# Patient Record
Sex: Male | Born: 1940 | Race: White | Hispanic: No | Smoking: Former smoker
Health system: Southern US, Community
[De-identification: ages and names within clinical notes are randomized; demographics above are authoritative.]

## PROBLEM LIST (undated history)

## (undated) DIAGNOSIS — K219 Gastro-esophageal reflux disease without esophagitis: Secondary | ICD-10-CM

## (undated) DIAGNOSIS — Z860101 Personal history of adenomatous and serrated colon polyps: Secondary | ICD-10-CM

## (undated) DIAGNOSIS — I6523 Occlusion and stenosis of bilateral carotid arteries: Secondary | ICD-10-CM

## (undated) DIAGNOSIS — Z87448 Personal history of other diseases of urinary system: Secondary | ICD-10-CM

## (undated) DIAGNOSIS — D490 Neoplasm of unspecified behavior of digestive system: Secondary | ICD-10-CM

## (undated) DIAGNOSIS — Z87442 Personal history of urinary calculi: Secondary | ICD-10-CM

## (undated) DIAGNOSIS — E119 Type 2 diabetes mellitus without complications: Secondary | ICD-10-CM

## (undated) DIAGNOSIS — Z86010 Personal history of colonic polyps: Secondary | ICD-10-CM

## (undated) DIAGNOSIS — I10 Essential (primary) hypertension: Secondary | ICD-10-CM

## (undated) DIAGNOSIS — Z973 Presence of spectacles and contact lenses: Secondary | ICD-10-CM

## (undated) DIAGNOSIS — Z86718 Personal history of other venous thrombosis and embolism: Secondary | ICD-10-CM

## (undated) DIAGNOSIS — T7840XA Allergy, unspecified, initial encounter: Secondary | ICD-10-CM

## (undated) DIAGNOSIS — J3089 Other allergic rhinitis: Secondary | ICD-10-CM

## (undated) DIAGNOSIS — N183 Chronic kidney disease, stage 3 unspecified: Secondary | ICD-10-CM

## (undated) DIAGNOSIS — I6529 Occlusion and stenosis of unspecified carotid artery: Secondary | ICD-10-CM

## (undated) DIAGNOSIS — N200 Calculus of kidney: Secondary | ICD-10-CM

## (undated) DIAGNOSIS — E559 Vitamin D deficiency, unspecified: Secondary | ICD-10-CM

## (undated) DIAGNOSIS — E349 Endocrine disorder, unspecified: Secondary | ICD-10-CM

## (undated) DIAGNOSIS — N210 Calculus in bladder: Secondary | ICD-10-CM

## (undated) DIAGNOSIS — E291 Testicular hypofunction: Secondary | ICD-10-CM

## (undated) DIAGNOSIS — E782 Mixed hyperlipidemia: Secondary | ICD-10-CM

## (undated) DIAGNOSIS — R319 Hematuria, unspecified: Secondary | ICD-10-CM

## (undated) DIAGNOSIS — Z87898 Personal history of other specified conditions: Secondary | ICD-10-CM

## (undated) DIAGNOSIS — Z86711 Personal history of pulmonary embolism: Secondary | ICD-10-CM

## (undated) DIAGNOSIS — I723 Aneurysm of iliac artery: Secondary | ICD-10-CM

## (undated) DIAGNOSIS — R399 Unspecified symptoms and signs involving the genitourinary system: Secondary | ICD-10-CM

## (undated) DIAGNOSIS — N182 Chronic kidney disease, stage 2 (mild): Secondary | ICD-10-CM

## (undated) DIAGNOSIS — R310 Gross hematuria: Secondary | ICD-10-CM

## (undated) DIAGNOSIS — D689 Coagulation defect, unspecified: Secondary | ICD-10-CM

## (undated) DIAGNOSIS — F419 Anxiety disorder, unspecified: Secondary | ICD-10-CM

## (undated) DIAGNOSIS — J309 Allergic rhinitis, unspecified: Secondary | ICD-10-CM

## (undated) HISTORY — PX: ORCHIOPEXY: SUR915

## (undated) HISTORY — DX: Endocrine disorder, unspecified: E34.9

## (undated) HISTORY — DX: Coagulation defect, unspecified: D68.9

## (undated) HISTORY — DX: Allergy, unspecified, initial encounter: T78.40XA

## (undated) HISTORY — DX: Neoplasm of unspecified behavior of digestive system: D49.0

## (undated) HISTORY — DX: Testicular hypofunction: E29.1

## (undated) HISTORY — DX: Calculus of kidney: N20.0

## (undated) HISTORY — PX: TONSILLECTOMY: SUR1361

## (undated) HISTORY — DX: Gastro-esophageal reflux disease without esophagitis: K21.9

## (undated) HISTORY — DX: Vitamin D deficiency, unspecified: E55.9

## (undated) HISTORY — PX: OTHER SURGICAL HISTORY: SHX169

## (undated) HISTORY — PX: COLONOSCOPY: SHX174

---

## 2001-01-03 ENCOUNTER — Ambulatory Visit (HOSPITAL_BASED_OUTPATIENT_CLINIC_OR_DEPARTMENT_OTHER): Admission: RE | Admit: 2001-01-03 | Discharge: 2001-01-03 | Payer: Self-pay | Admitting: Surgery

## 2001-01-03 ENCOUNTER — Encounter (INDEPENDENT_AMBULATORY_CARE_PROVIDER_SITE_OTHER): Payer: Self-pay

## 2001-01-03 ENCOUNTER — Other Ambulatory Visit: Admission: RE | Admit: 2001-01-03 | Discharge: 2001-01-03 | Payer: Self-pay | Admitting: Surgery

## 2001-01-03 HISTORY — PX: OTHER SURGICAL HISTORY: SHX169

## 2001-07-11 ENCOUNTER — Other Ambulatory Visit: Admission: RE | Admit: 2001-07-11 | Discharge: 2001-07-11 | Payer: Self-pay | Admitting: Surgery

## 2002-02-06 ENCOUNTER — Ambulatory Visit (HOSPITAL_COMMUNITY): Admission: RE | Admit: 2002-02-06 | Discharge: 2002-02-06 | Payer: Self-pay | Admitting: Internal Medicine

## 2002-02-06 ENCOUNTER — Encounter: Payer: Self-pay | Admitting: Internal Medicine

## 2002-02-11 ENCOUNTER — Ambulatory Visit (HOSPITAL_COMMUNITY): Admission: RE | Admit: 2002-02-11 | Discharge: 2002-02-11 | Payer: Self-pay | Admitting: Internal Medicine

## 2002-02-11 ENCOUNTER — Encounter: Payer: Self-pay | Admitting: Internal Medicine

## 2002-02-13 ENCOUNTER — Emergency Department (HOSPITAL_COMMUNITY): Admission: EM | Admit: 2002-02-13 | Discharge: 2002-02-13 | Payer: Self-pay | Admitting: Emergency Medicine

## 2004-04-22 ENCOUNTER — Ambulatory Visit: Payer: Self-pay | Admitting: Gastroenterology

## 2004-05-05 ENCOUNTER — Ambulatory Visit: Payer: Self-pay | Admitting: Gastroenterology

## 2006-07-12 ENCOUNTER — Ambulatory Visit (HOSPITAL_COMMUNITY): Admission: RE | Admit: 2006-07-12 | Discharge: 2006-07-12 | Payer: Self-pay | Admitting: Internal Medicine

## 2006-07-12 IMAGING — CR DG CHEST 2V
3 series · 3 of 3 positions shown · non-contrast
Comparison: [DATE].

CLINICAL DATA: Hypertension.  Dyspnea.  
 CHEST - 2 VIEW:

[view not recorded (1 of 3)]
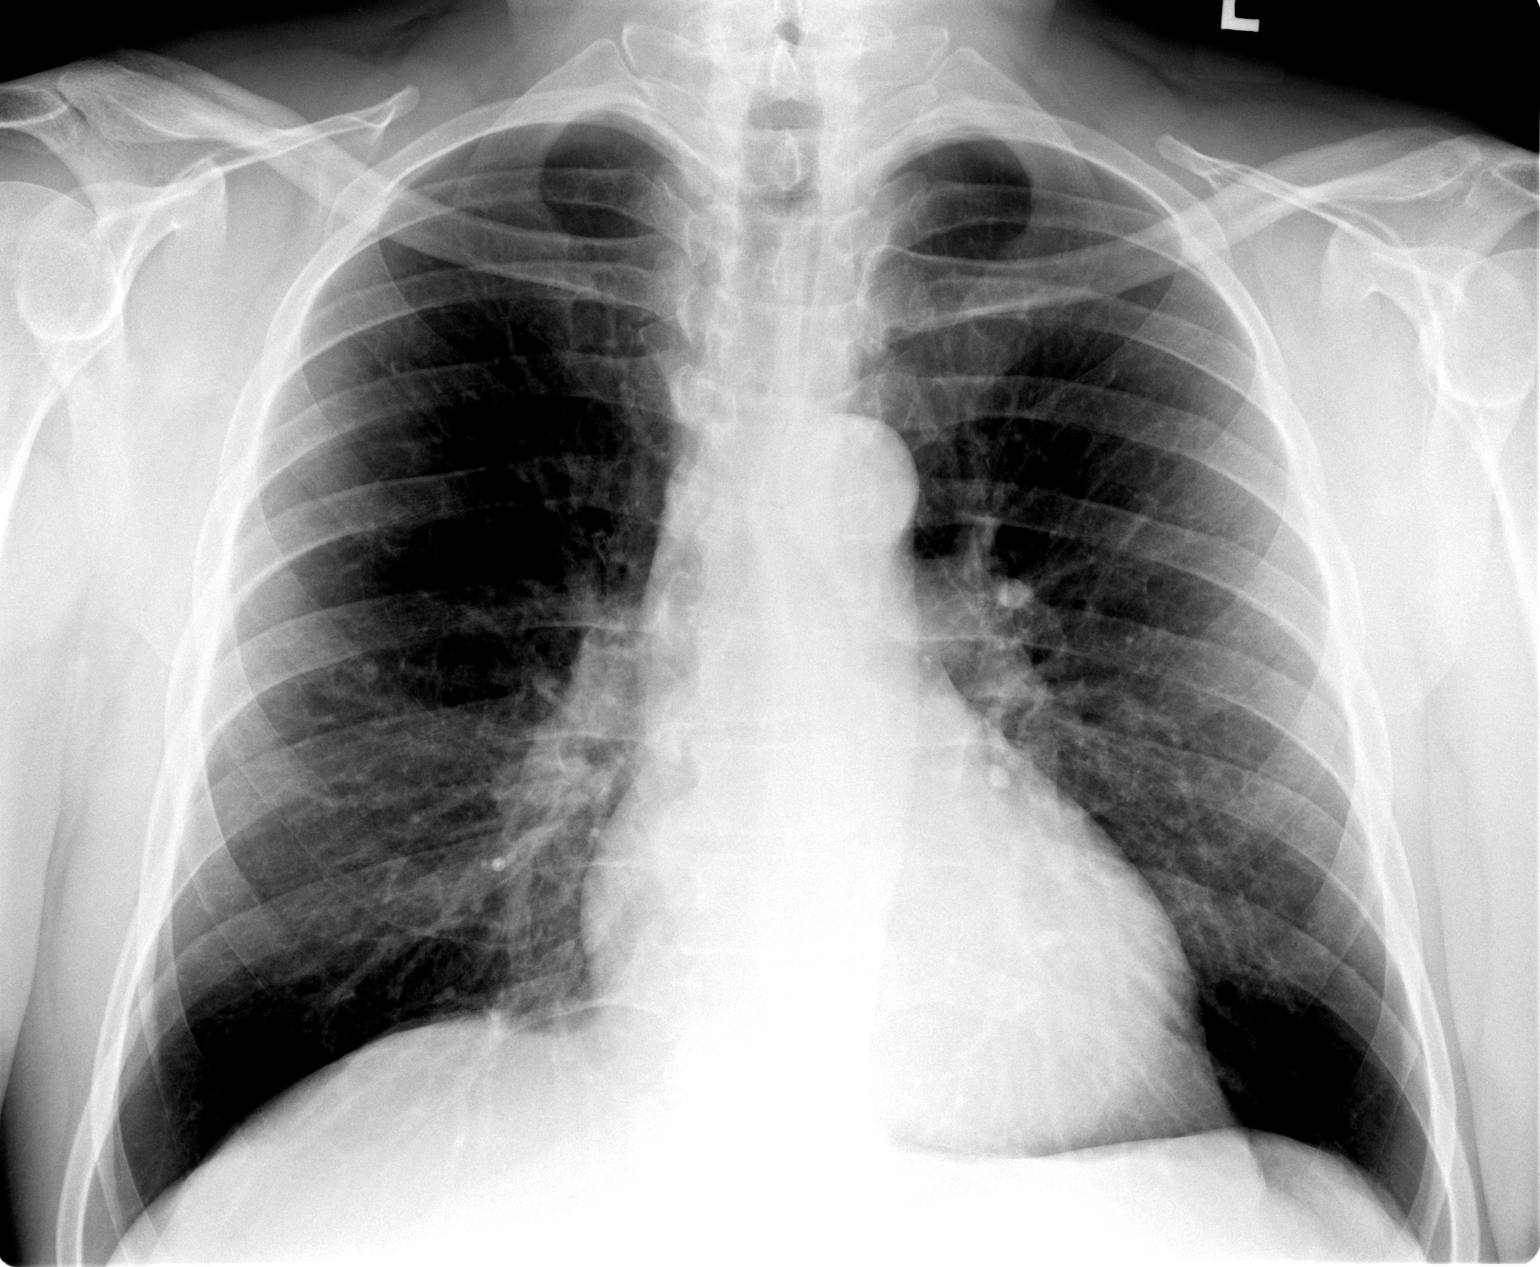

[view not recorded (2 of 3)]
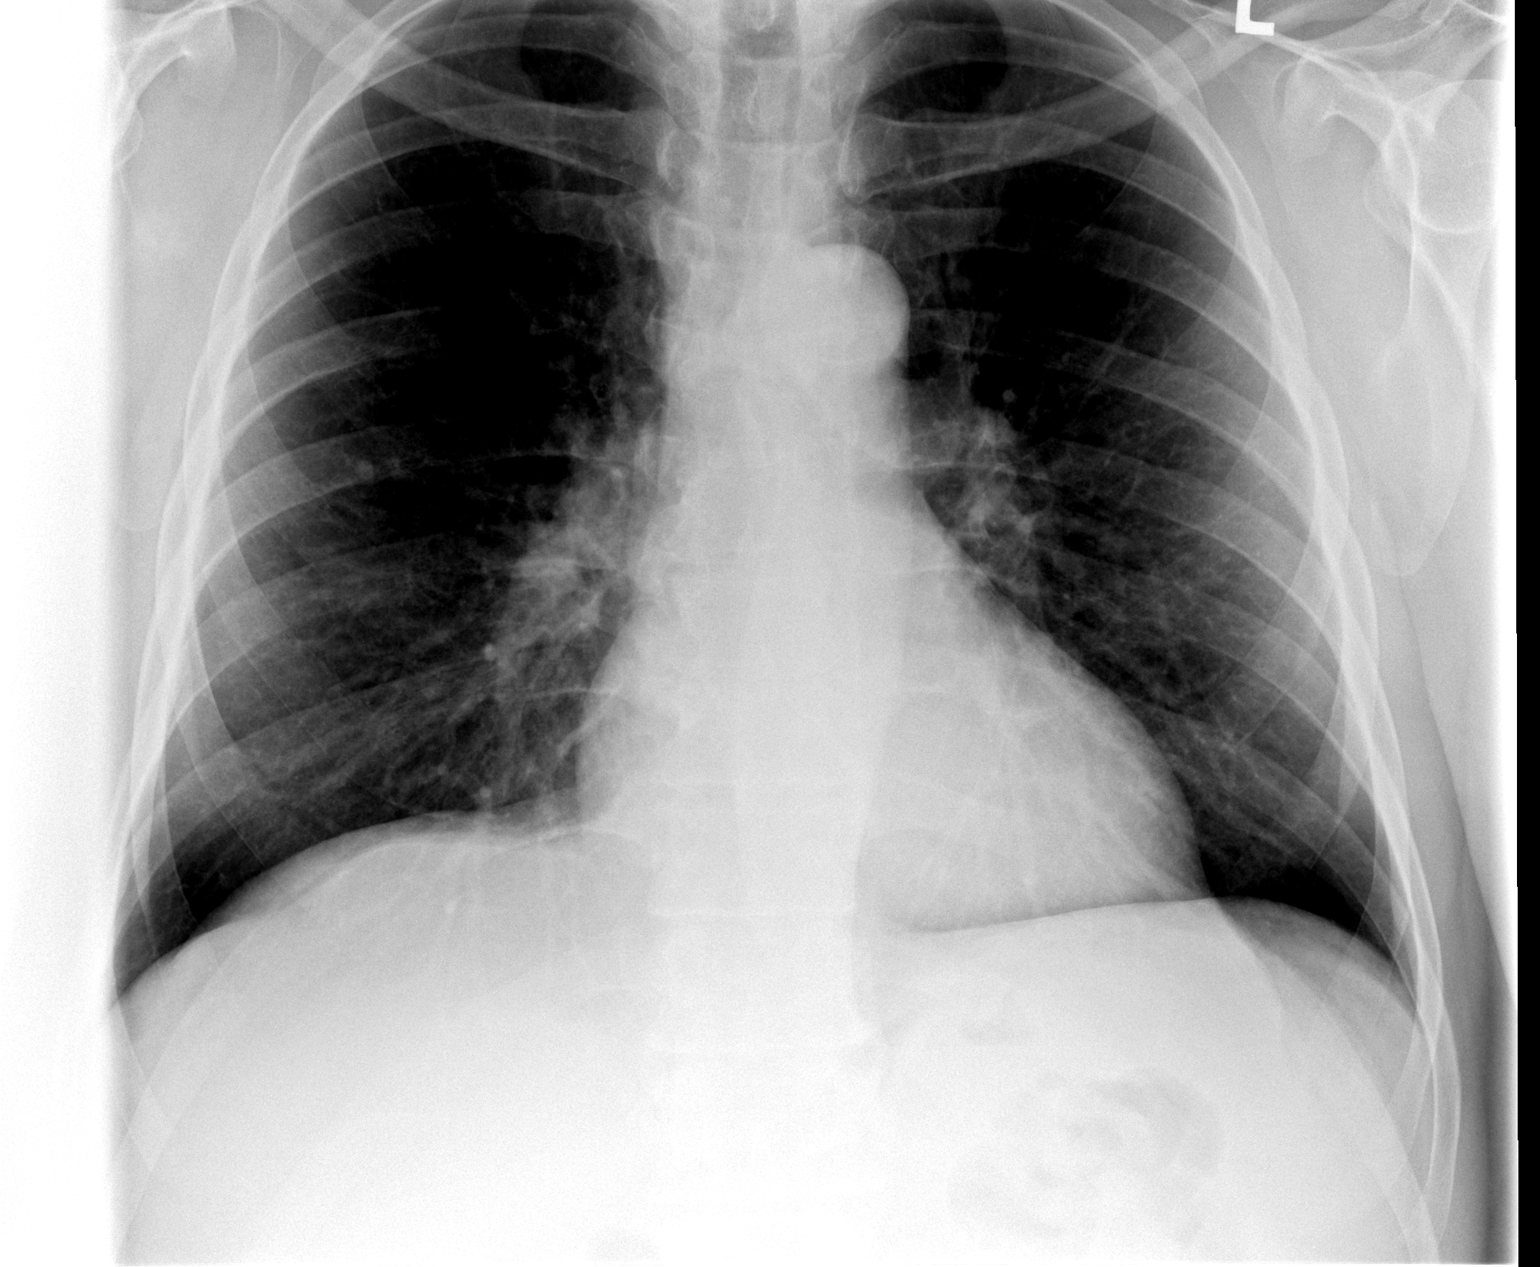

[view not recorded (3 of 3)]
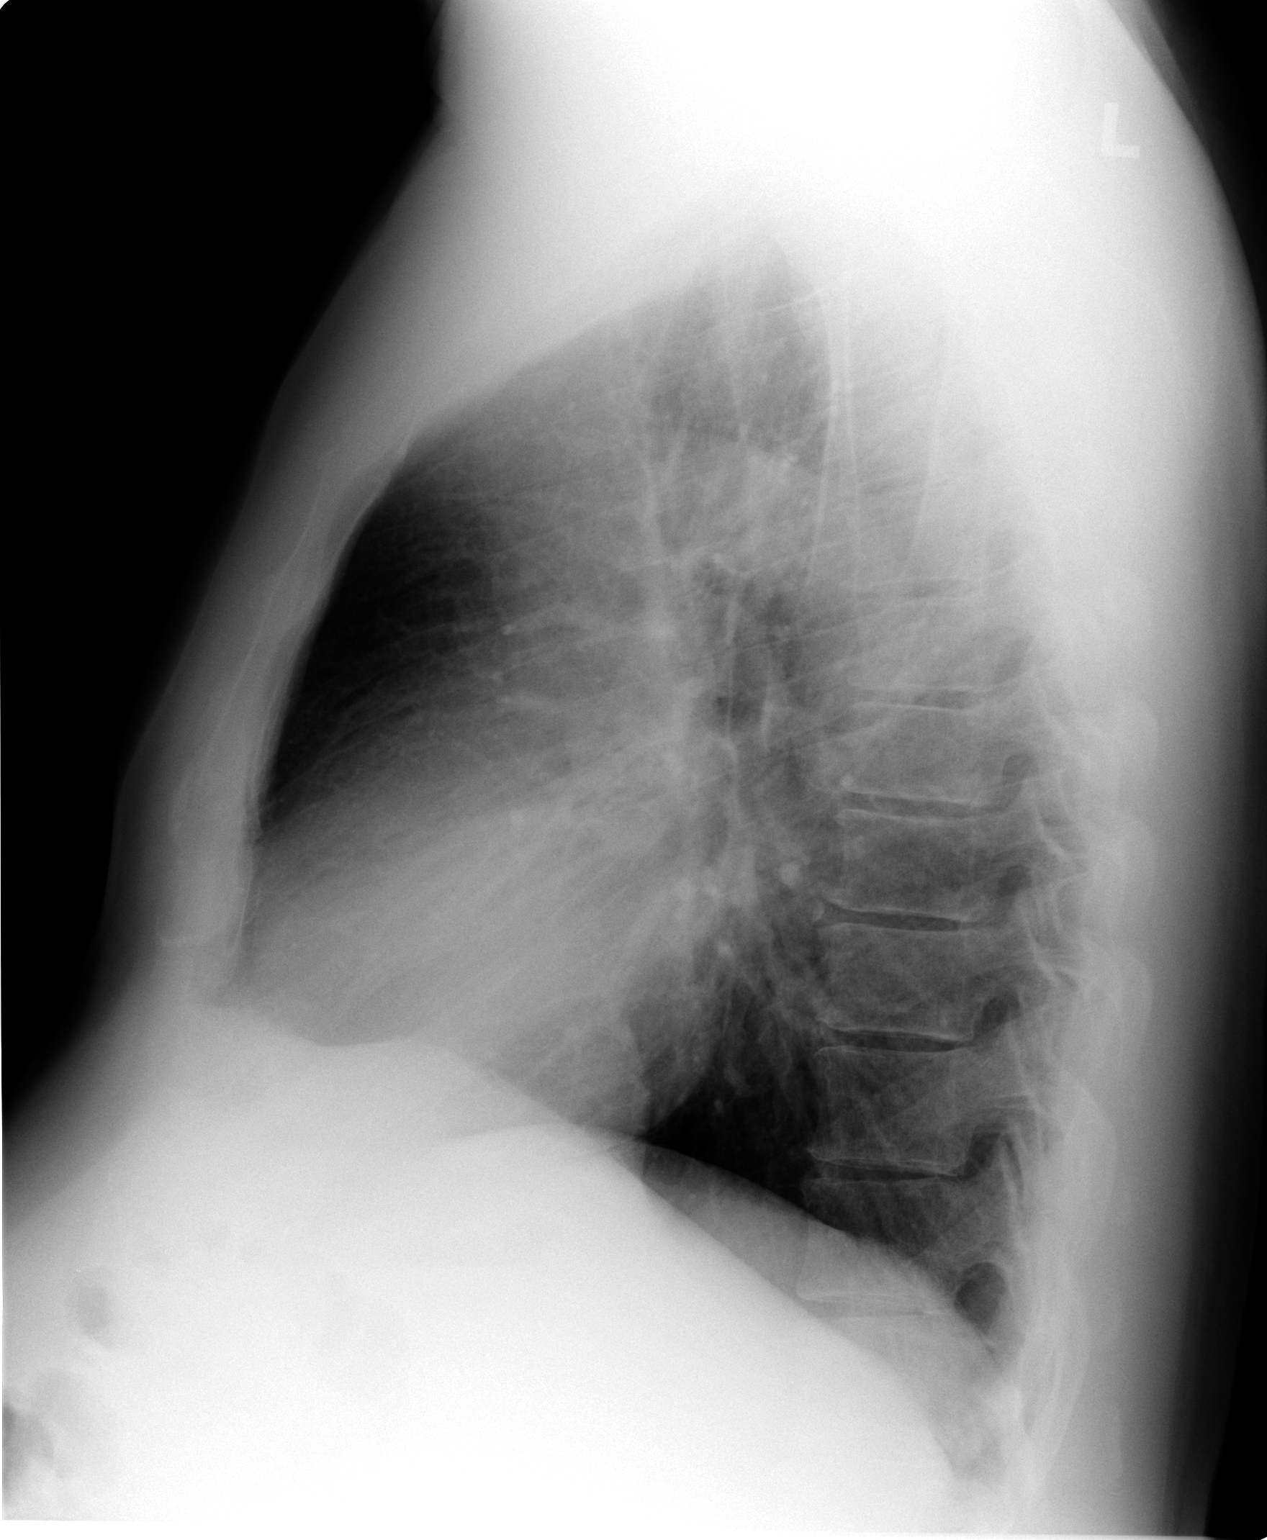

[3 of 3 positions shown; findings below may reference images not displayed]

FINDINGS: The heart is at the upper limits of normal in size.  The mediastinum is unremarkable.  The lungs are clear except for an old granuloma in the right lung previously seen.  No effusions.  No significant bony finding.
IMPRESSION: No change.  No active disease.

## 2007-05-24 ENCOUNTER — Ambulatory Visit: Payer: Self-pay | Admitting: Gastroenterology

## 2007-06-07 ENCOUNTER — Ambulatory Visit: Payer: Self-pay | Admitting: Gastroenterology

## 2007-06-07 ENCOUNTER — Encounter: Payer: Self-pay | Admitting: Gastroenterology

## 2007-11-19 ENCOUNTER — Inpatient Hospital Stay (HOSPITAL_COMMUNITY): Admission: EM | Admit: 2007-11-19 | Discharge: 2007-11-24 | Payer: Self-pay | Admitting: Emergency Medicine

## 2007-11-19 ENCOUNTER — Ambulatory Visit: Payer: Self-pay | Admitting: Vascular Surgery

## 2007-11-19 IMAGING — CT CT ANGIO CHEST
2 of 4 series · 19 of 36 positions shown · IV contrast (80 ml omni 300)
Comparison: [DATE] chest x-ray.

CLINICAL DATA: Shortness of breath with right lower extremity DVT.

CT ANGIOGRAPHY CHEST
TECHNIQUE: Multidetector CT imaging of the chest using the
standard protocol during bolus administration of intravenous
contrast. Multiplanar reconstructed images obtained and reviewed to
evaluate the vascular anatomy.
Contrast: 80 ml intravenous [GH]

[Series 2: pe · axial · 0.81mm/px · z∈[-308,-34]mm · 16 of 248 slices shown]
[im 14/248  lung]
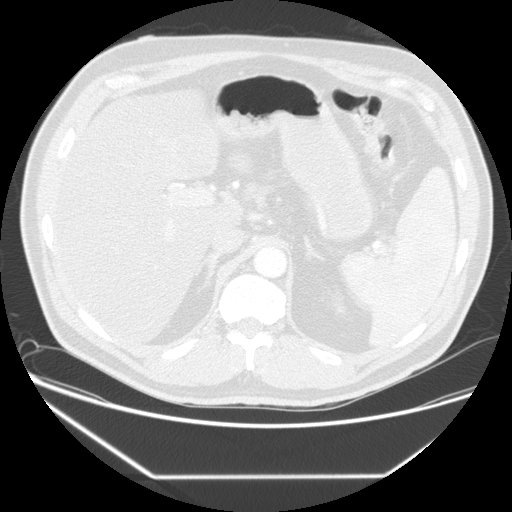
[im 28/248  mediastinal]
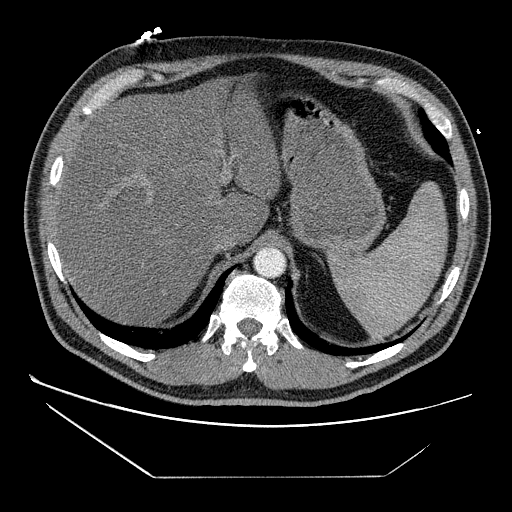
[im 42/248  lung]
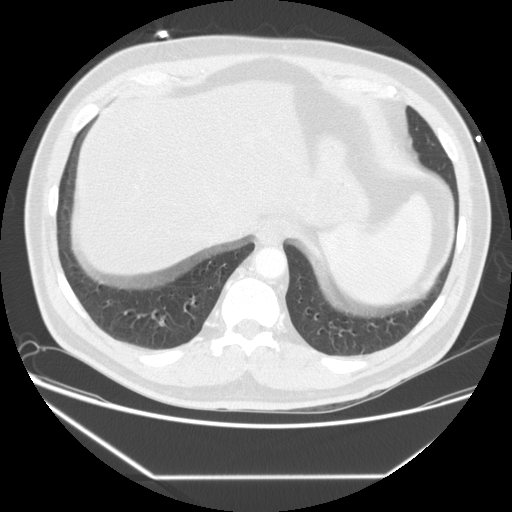
[im 55/248  mediastinal]
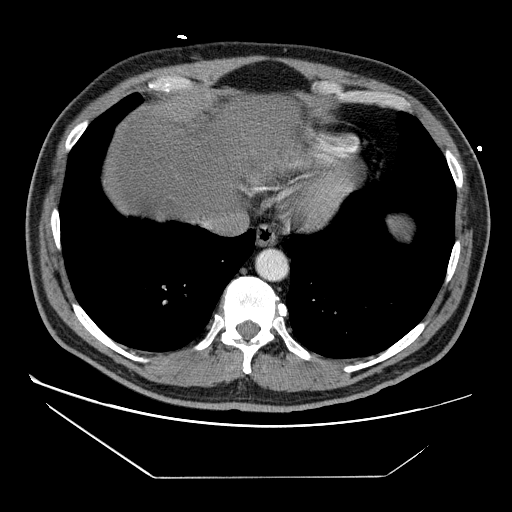
[im 69/248  lung]
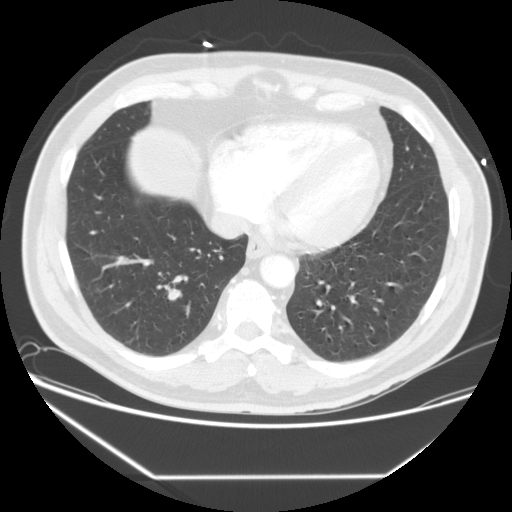
[im 83/248  mediastinal]
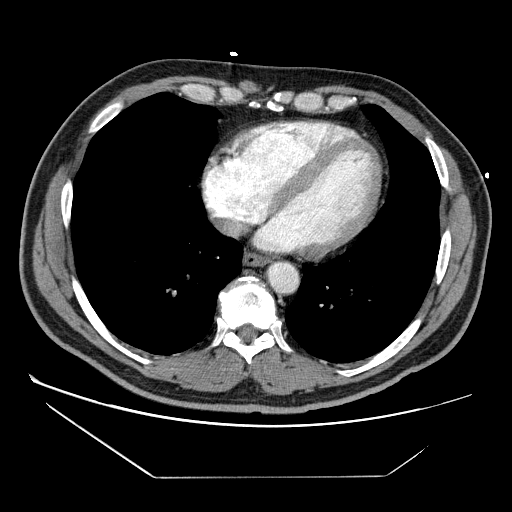
[im 97/248  lung]
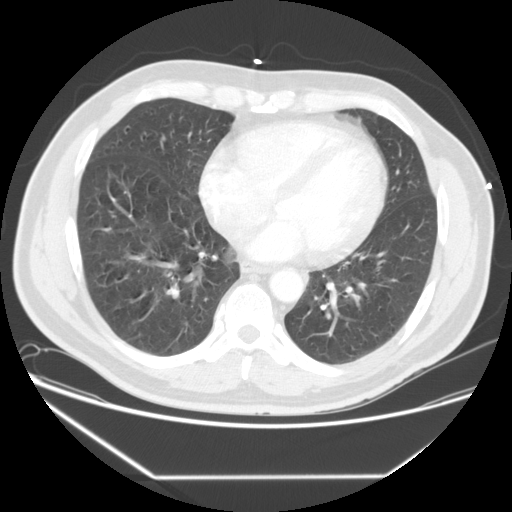
[im 110/248  mediastinal]
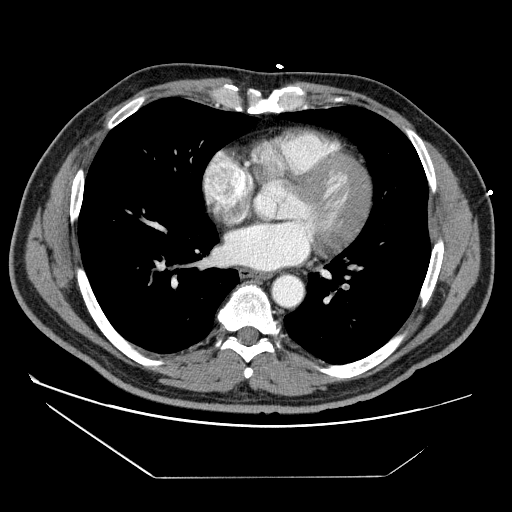
[im 138/248  lung]
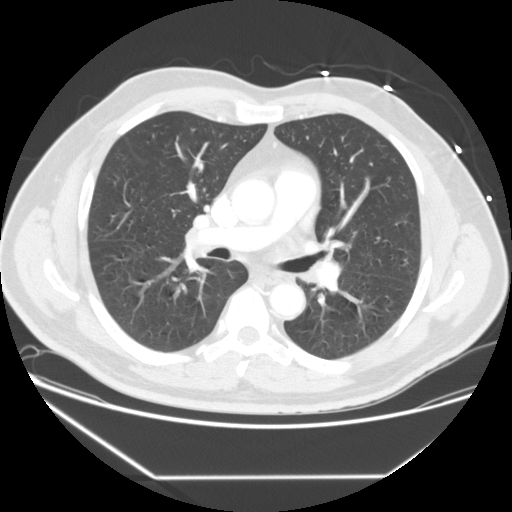
[im 151/248  mediastinal]
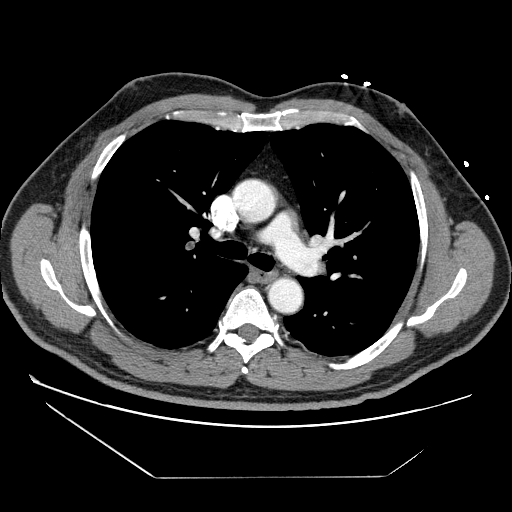
[im 165/248  lung]
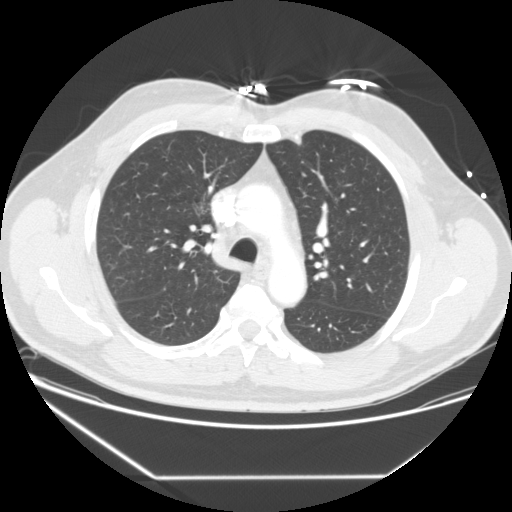
[im 179/248  mediastinal]
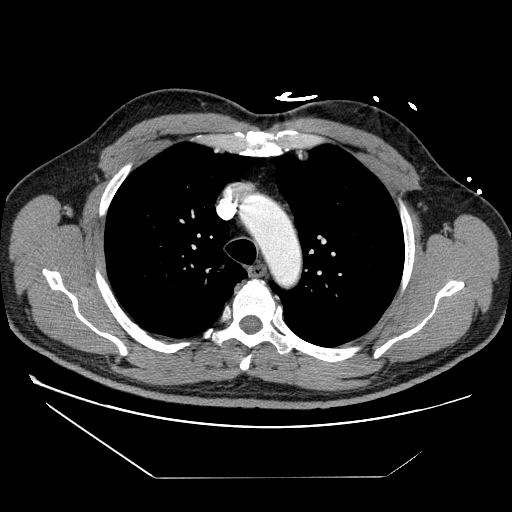
[im 193/248  lung]
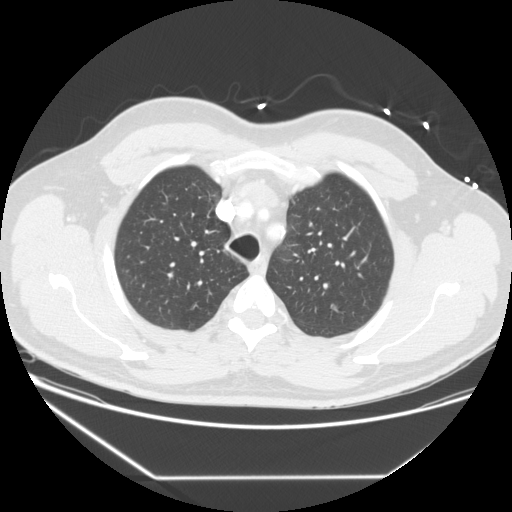
[im 206/248  mediastinal]
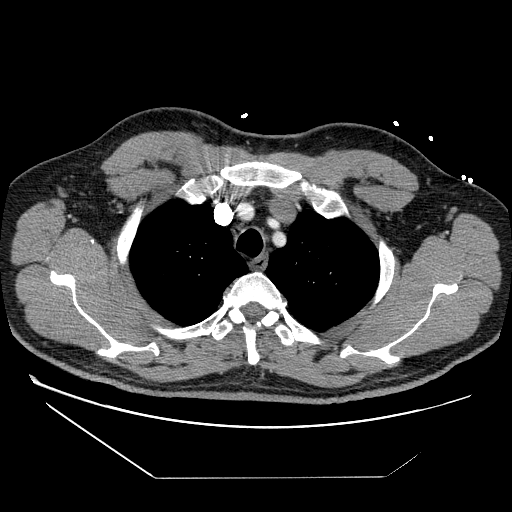
[im 220/248  lung]
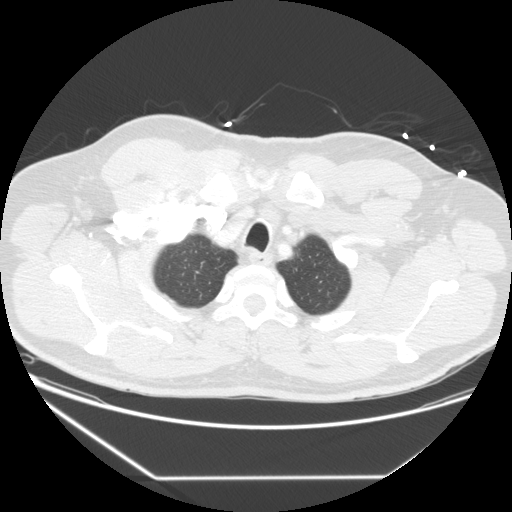
[im 234/248  mediastinal]
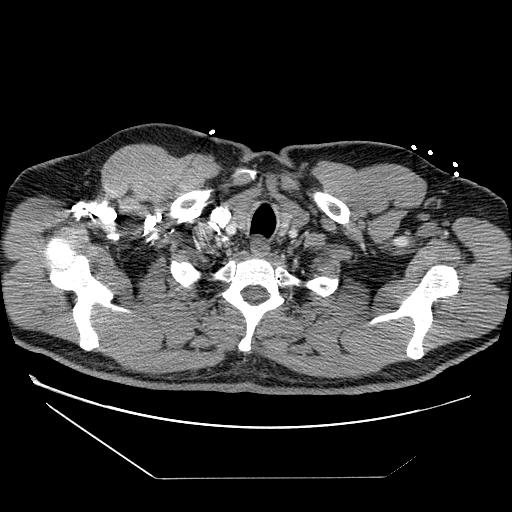

[Series 202: reformatted · coronal · 0.81mm/px · 3 of 104 slices shown]
[im 21/104  mediastinal]
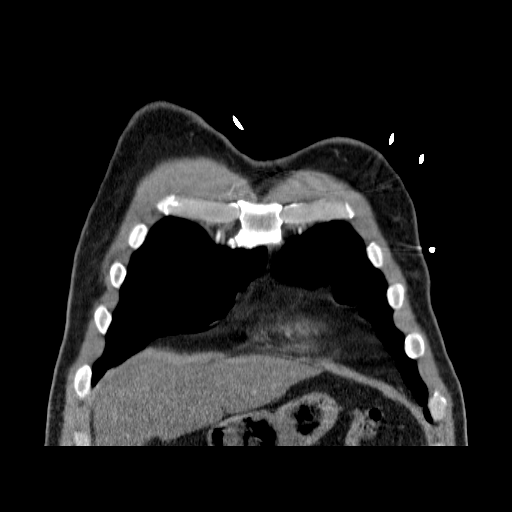
[im 42/104  mediastinal]
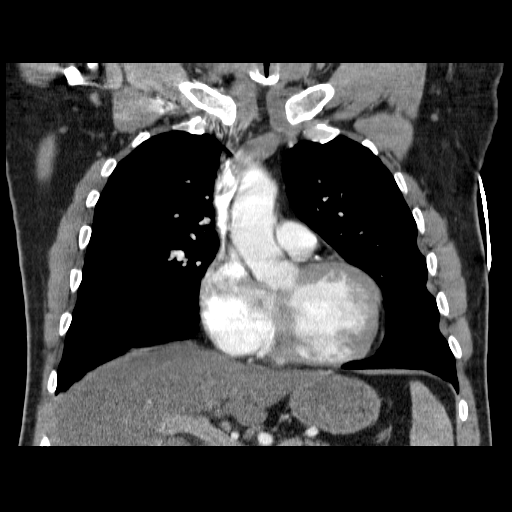
[im 62/104  mediastinal]
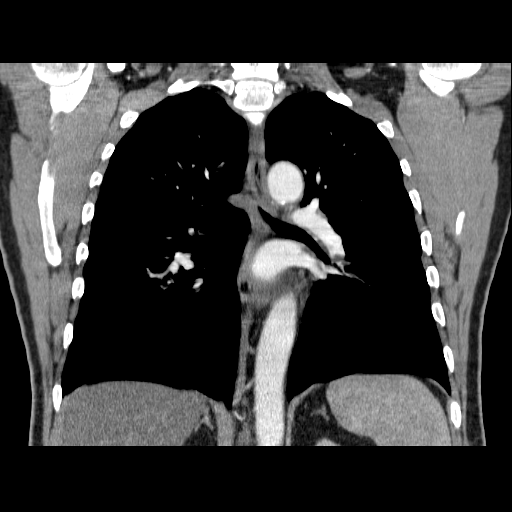

[19 of 36 positions shown; findings below may reference images not displayed]

FINDINGS: Filling defects are identified within both main pulmonary
arteries, and within the pulmonary arteries leading to the right
upper lobe, right middle lobe, and bilateral lower lobes -
compatible with pulmonary emboli.

An ill-defined faint 8 x 12 mm opacity within the medial right
upper lobe is nonspecific.

The lungs are otherwise clear.

No pleural or pericardial effusions are present.

No enlarged lymph nodes are identified.

There is no evidence of thoracic aortic dissection or aneurysm.

Diffuse fatty infiltration the liver and cholelithiasis identified.
IMPRESSION: Bilateral pulmonary emboli.

Ill-defined 8 x 12 mm right upper lobe opacity - nonspecific.  This
may represent sequelae from the pulmonary emboli but limited CT
follow-up of this area is recommended in 6 months.

Fatty infiltration of the liver and cholelithiasis.

Critical test results telephoned to Dr. MAYULU at the time of
interpretation on date [DATE] at time [DATE] p.m..

## 2007-11-20 ENCOUNTER — Ambulatory Visit: Payer: Self-pay | Admitting: Surgery

## 2007-11-20 ENCOUNTER — Encounter (INDEPENDENT_AMBULATORY_CARE_PROVIDER_SITE_OTHER): Payer: Self-pay | Admitting: Internal Medicine

## 2007-11-21 HISTORY — PX: IVC FILTER INSERTION: CATH118245

## 2007-11-21 IMAGING — RF DG CHEST 1V
1 series · 5 of 5 positions shown · non-contrast
Comparison: None.

CLINICAL DATA: IVC filter placement intraoperatively.

INTRAOPERATIVE ABDOMEN- 1 VIEW [DATE]:

[Series 1: run · 2 acquisitions, 5 frames shown]
[im 1/2]
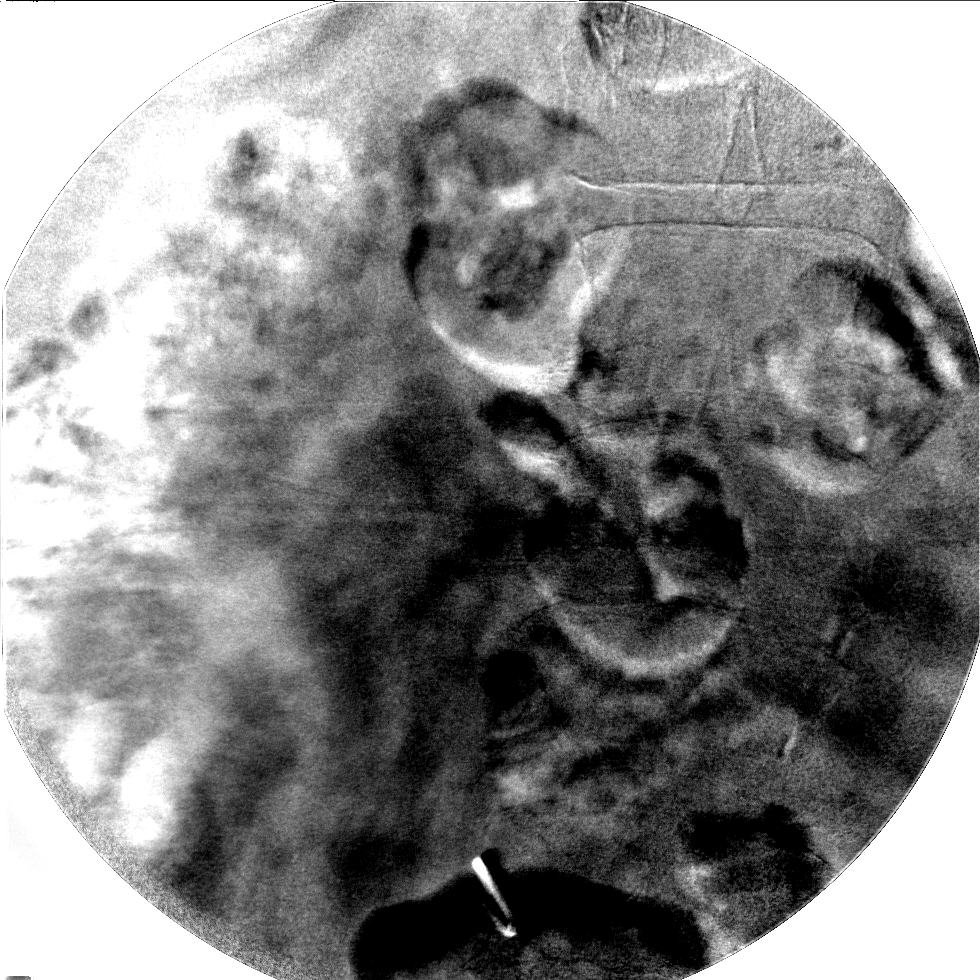
[im 1/2]
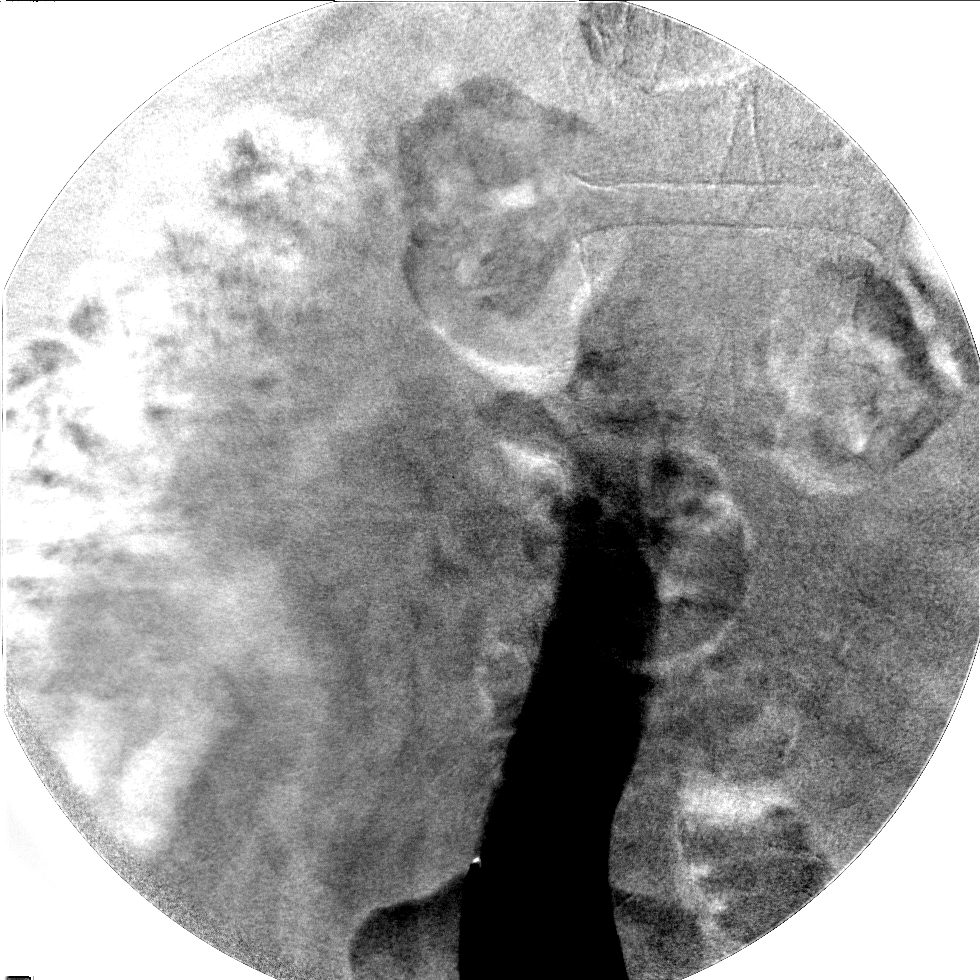
[im 1/2]
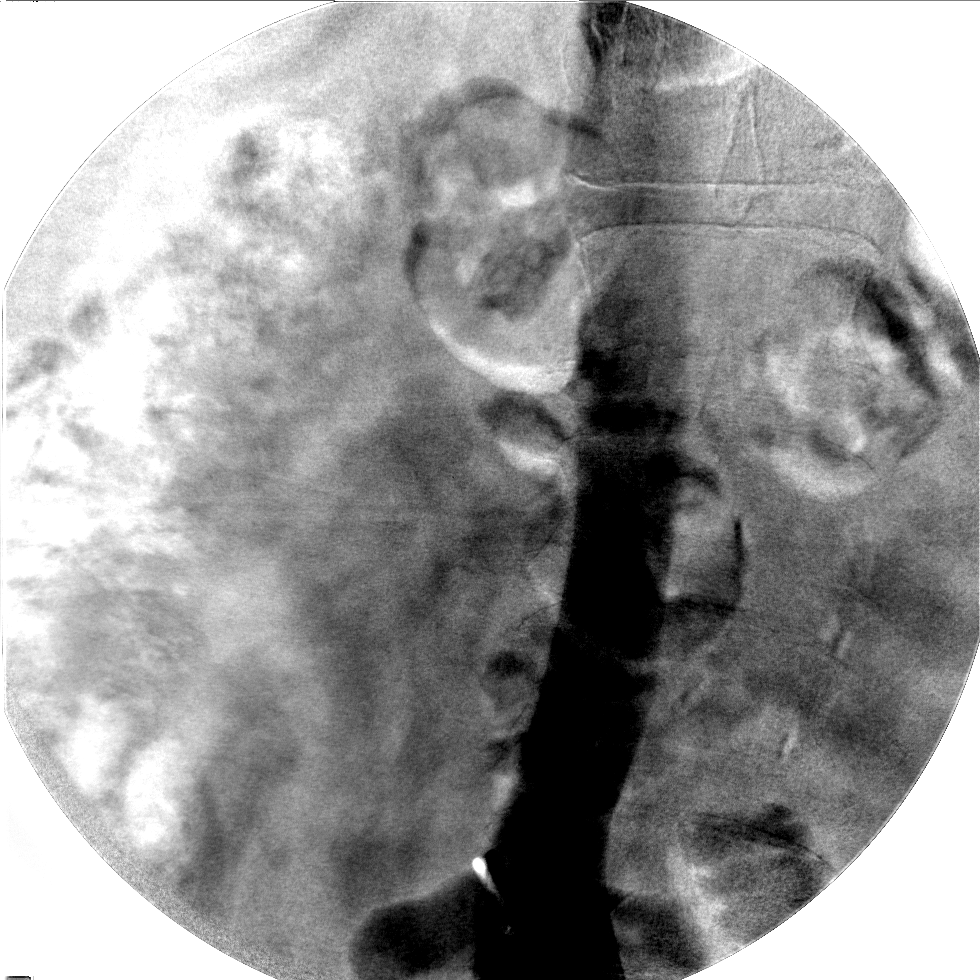
[im 1/2]
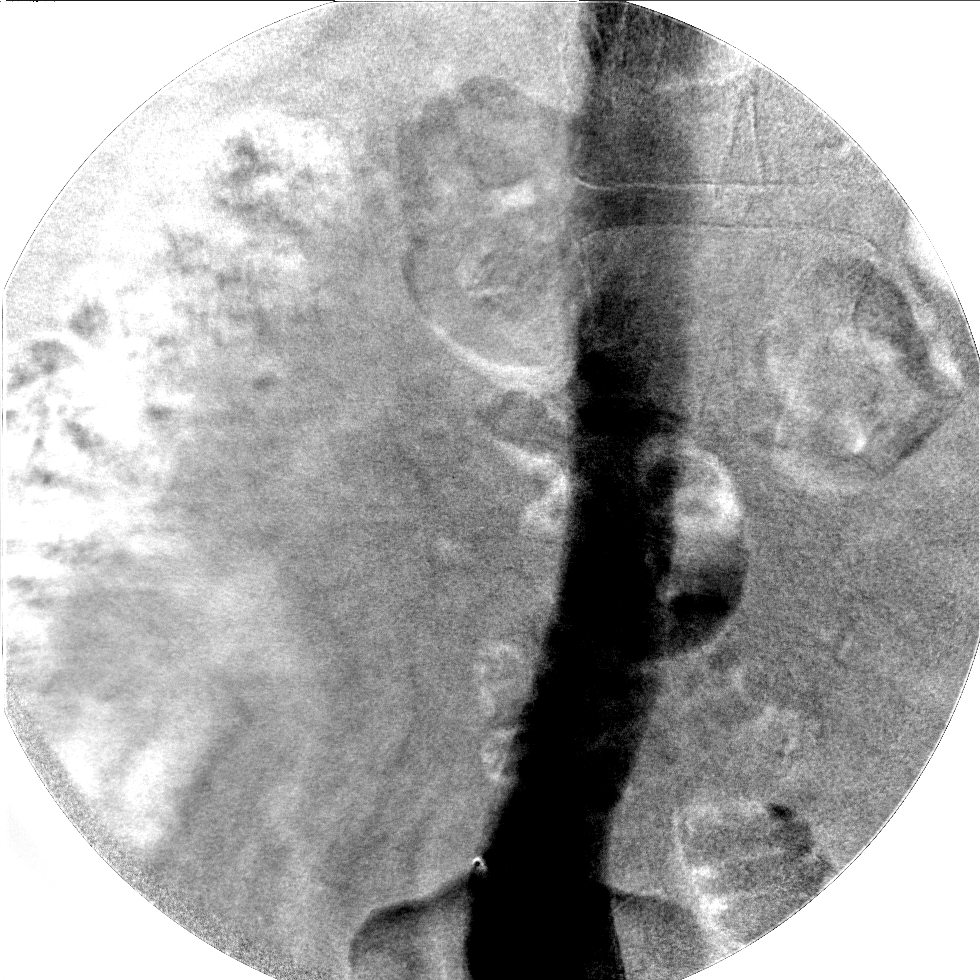
[im 2/2]
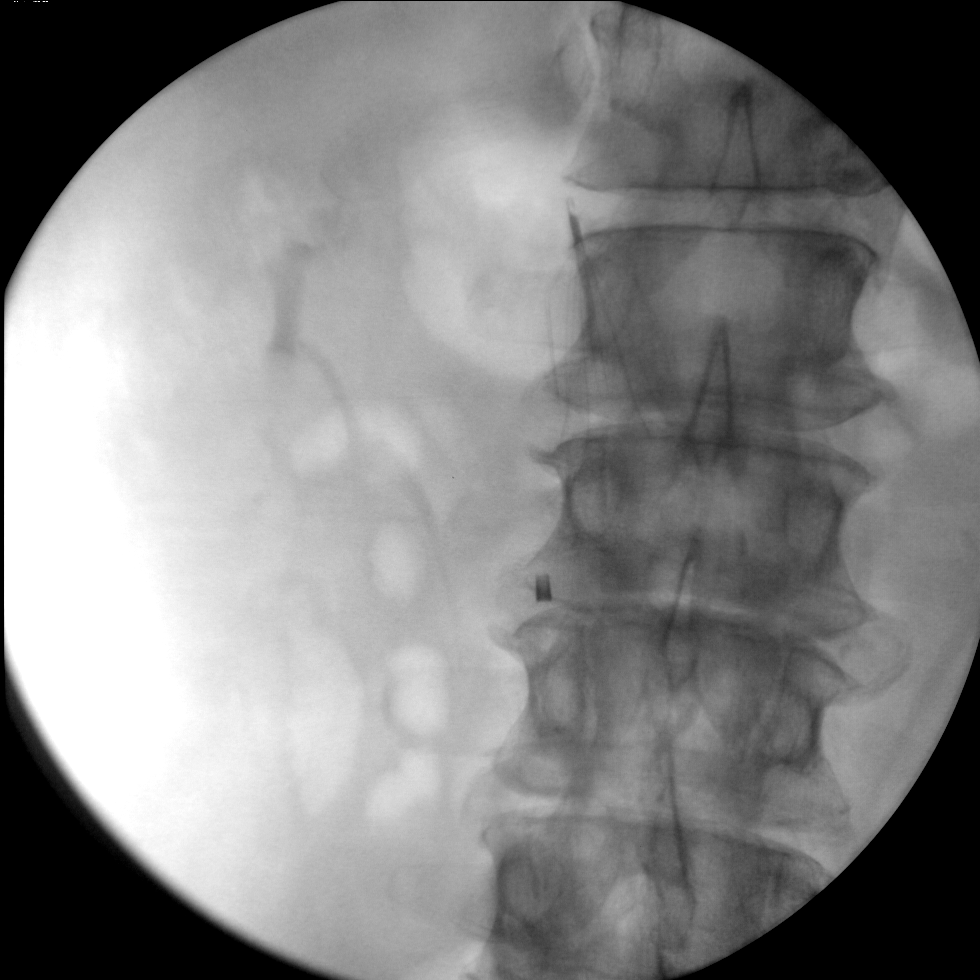

[5 of 5 positions shown; findings below may reference images not displayed]

FINDINGS: Single spot image from the C-arm fluoroscopic device
submitted for interpretation post-operatively demonstrates an IVC
filter with its tip at the T12 - L1 level.
IMPRESSION: IVC filter placement intraoperatively.

## 2008-01-21 ENCOUNTER — Ambulatory Visit: Payer: Self-pay | Admitting: Surgery

## 2008-01-31 ENCOUNTER — Ambulatory Visit: Payer: Self-pay | Admitting: Surgery

## 2008-01-31 ENCOUNTER — Ambulatory Visit (HOSPITAL_COMMUNITY): Admission: RE | Admit: 2008-01-31 | Discharge: 2008-01-31 | Payer: Self-pay | Admitting: Surgery

## 2008-01-31 HISTORY — PX: IVC FILTER REMOVAL: CATH118246

## 2010-08-03 NOTE — Procedures (Signed)
DUPLEX DEEP VENOUS EXAM - LOWER EXTREMITY   INDICATION:  Lab only.  Right leg edema.   HISTORY:  Edema:  Four days of right leg edema.  Trauma/Surgery:  No.  Pain:  Four days of right leg pain.  PE:  No.  Previous DVT:  No.  Anticoagulants:  No.  Other:  No.   DUPLEX EXAM:                CFV   SFV   PopV  PTV    GSV                R  L  R  L  R  L  R   L  R  L  Thrombosis    P  0  +     +     +      0  Spontaneous   +  +  0     0     0      +  Phasic        +  +  0     0     0      0  Augmentation     +  0     0     0      0  Compressible  P  +  0     0     0      +  Competent        +  0     0     +      0   Legend:  + - yes  o - no  p - partial  D - decreased   IMPRESSION:  Extensive deep vein thrombus is seen extending from a  partial thrombus in the right common femoral vein throughout a  completely occluded superficial femoral vein, popliteal vein and into  the distal calf portions of the posterior tibial veins.    _____________________________  Larina Earthly, M.D.   MC/MEDQ  D:  11/20/2007  T:  11/20/2007  Job:  [id number]

## 2010-08-03 NOTE — Assessment & Plan Note (Signed)
OFFICE VISIT   Joshua Peterson  DOB:  [birth year]                                       01/21/2008  YNWGN#:[id number]   REASON FOR VISIT:  Evaluate for filter removal.   HISTORY:  This is Peterson 70 year old gentleman who presented to the hospital  11/19/2007 with Peterson stent to his right lower extremity deep vein  thrombosis as well as pulmonary embolism.  Due to the significant clot  burden and his pulmonary vasculature, we elected to place an IVC filter.  The patient has been anticoagulated for 2 months now.  He is improving.  He has not had any shortness of breath.  He does still have some  swelling in his right leg which he wears compression stockings for.   PHYSICAL EXAMINATION:  Blood pressure is 130/83, pulse 57.  Generally,  he is well-appearing, in no acute distress.  Right leg is more swollen  than the left.  There is no ulceration.  Respirations are nonlabored.   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  The  left common femoral vein is within normal limits.   ASSESSMENT AND PLAN:  Deep vein thrombosis/pulmonary embolism.   PLAN:  The patient's IVC filter was placed due to the significant clot  burden in his pulmonary vasculature.  He had never been on  anticoagulation.  I think it is reasonable at this time to remove his  filter as he is tolerating his anticoagulation.  I have recommended that  he stop his treatment 5 days prior to the procedure.  He will be bridged  with Lovenox.  We discussed the details of the procedure.  He will call  to schedule this around his work schedule.  He understands that there is  an approximately 5% chance that we will not be able to remove the  filter.  Also, if there is clot within the filter, we will not remove  it.  It is Peterson Bard G2 filter.   Joshua Ny, MD  Electronically Signed   VWB/MEDQ  D:  01/21/2008  T:  01/22/2008  Job:  [id number]   cc:   Lucky Cowboy, M.D.  Lonia Blood, M.D.

## 2010-08-03 NOTE — Discharge Summary (Signed)
NAME:  Joshua Peterson, Joshua Peterson NO.:  [id number]   MEDICAL RECORD NO.:  [id number]          PATIENT TYPE:  INP   LOCATION:  2013                         FACILITY:  [facility]   PHYSICIAN:  Lonia Blood, M.D.DATE OF BIRTH:  1941/01/23   DATE OF ADMISSION:  11/19/2007  DATE OF DISCHARGE:  11/24/2007                               DISCHARGE SUMMARY   PRIMARY CARE PHYSICIAN:  Lucky Cowboy, MD   VASCULAR SURGEON:  1. Durene Cal IV, MD   DISCHARGE DIAGNOSES:  1. Bilateral pulmonary emboli with extensive right lower extremity      deep venous thrombosis.      a.     Status post IVC filter November 21, 2007.      b.     Status post Lovenox to Coumadin bridge.      c.     Coumadin for a minimum of 6 months to be continued.      d.     Followup appointment for probable removal of IVC filter in       approximately 2 months.  2. Newly diagnosed diabetes mellitus.      a.     Remote history of previous attempts at medication treatment.      b.     The patient previously educated on diabetic diet.  3. Hypertension.  4. Hyperlipidemia.   DISCHARGE MEDICATIONS:  1. Aspirin 81 mg p.o. daily.  2. Atenolol 100 mg p.o. daily.  3. Fenofibrate 134 mg p.o. daily.  4. Terbinafine 250 mg daily for 3 months and then stop.  5. Glucophage 500 mg p.o. b.i.d. with meals.  6. Coumadin as directed at discharge for a minimum of 6 months.   FOLLOWUP:  1. The patient is to follow up with his primary care physician, Dr.      Oneta Rack on Tuesday (it is presently Saturday) for reevaluation of      INR and adjustment of Coumadin as needed.  He also should be      scheduled for ongoing followup of his diabetes and reevaluation of      his blood sugars through Dr. Kathryne Sharper office.  2. The patient will be contacted by Dr. Estanislado Spire office for a      followup appointment in approximately 2 months to have repeat      venous duplex evaluations and possible removal of his IVC filter  coordinated.  3. Followup CT scan in 6 months recommended to re-evaluate right upper      lobe opacity likely secondary to pulmonary emboli.   PROCEDURES:  1. IVC filter placement November 21, 2007.  2. Carotid duplex studies November 20, 2007, mild calcific plaque      throughout bilaterally.  Vertebral artery flow antegrade      bilaterally.  40-60 ICA stenosis noted on the right with no      significant left ICA stenosis.  3. Venous Doppler studies November 20, 2007, extensive DVT without      propagation right lower extremity.  There is no change since      previous study of  August 31.  Nonocclusive DVT noted in the      proximal common femoral vein.  Occlusive DVT in the femoral,      popliteal and posterior tibial veins.  No evidence of left lower      extremity DVT.  4. CT angiogram on November 19, 2007, bilateral pulmonary emboli.  Ill-      defined 8 x 12 mm right upper lobe opacity which is nonspecific.      This may represent sequelae from the pulmonary emboli but limited      CT followup of this area is recommended in 6 months.   CONSULTATIONS:  Dr. Myra Gianotti with vascular surgery.   HOSPITAL COURSE:  Mr. Joshua Peterson is a very pleasant 70 year old  gentleman with the above listed medical history who presented to the  hospital with complaints of swelling of the right leg for 4 days.  Venous Dopplers and CT angiogram were carried out and revealed an  extensive right lower extremity DVT as well as bilateral pulmonary  emboli.  The patient was admitted to the acute unit.  He was fully  anticoagulated with Lovenox and Coumadin was initiated.  A minimum of 5  day overlap was accomplished.  Due to the significant clot burden and  the extensive nature of the bilateral pulmonary emboli IVC filter was  felt to be indicated.  Vascular surgery was consulted.  They agreed.  On  November 21, 2007, the patient was taken into the operating area and an  IVC filter was successfully placed  without significant complications.  The patient's postoperative course was uncomplicated.  The patient's  right lower extremity edema steadily improved.  By November 24, 2007,  the patient's INR had been at or greater than 1.7 and specifically 2.0  for a minimum of 24 hours.  He was therefore cleared for discharge with  followup with his primary care physician.  He is to continue Coumadin  for a minimum of 6 months.  At the present time there is no clear  etiology of the patient's DVT formation except for the possibility of  frequent travel due to his job but most of this is local.  After the  patient's 6 months of treatment it will be left to his primary care  physician to determine if further workup for hypercoagulable state is  indicated though a genetic defect is unlikely to be newly diagnosed at  age 70.  As noted above the patient is to follow up with his vascular  surgeon in 2 months for consideration of removal of the patient's IVC  filter.  The patient has been counseled as to the multiple possible side  effects of Coumadin dosing to include bleeding.   During the patient's hospitalization consistently elevated serum glucose  levels were noted in the range of 157-220.  A fasting serum glucose was  therefore ordered and in fact returned markedly elevated at 196.  Hemoglobin A1c was assessed and found to be 7.0.  This was discussed  directly with the patient.  He admitted that he had in the past been  treated for diabetes with medications but stated that it resulted in  severe low blood sugar.  He stated that he had been previously  educated on a diabetic diet and proper care of a patient with diabetes.  I have advised him that he does in fact have true diabetes.  I have  encouraged him to continue his aspirin.  I am adding Glucophage to his  regimen.  I encouraged him to recommit himself to a diabetic diet.  It  is recommended that a CBG be followed up in the patient's visit with  his  primary care physician and that he be advised further on monitoring his  CBGs in home setting.  ACE inhibitor should be initiated at such times  as the patient has stabilized clinically and established ongoing  followup.   On November 24, 2007, the patient's vital signs were stable.  He was  afebrile.  The patient was cleared for discharge home with no physical  complaints whatsoever.      Lonia Blood, M.D.  Electronically Signed     JTM/MEDQ  D:  11/24/2007  T:  11/24/2007  Job:  [id number]   cc:   Lucky Cowboy, M.D.  Jorge Ny, MD

## 2010-08-03 NOTE — Op Note (Signed)
NAME:  Joshua Peterson, Joshua Peterson                  ACCOUNT NO.:  [id number]   MEDICAL RECORD NO.:  [id number]          PATIENT TYPE:  INP   LOCATION:  2013                         FACILITY:  [facility]   PHYSICIAN:  Juleen China IV, MDDATE OF BIRTH:  1940/09/30   DATE OF PROCEDURE:  11/21/2007  DATE OF DISCHARGE:                               OPERATIVE REPORT   SURGEON:  1. Durene Cal IV, MD   PREOPERATIVE DIAGNOSES:  Deep venous thrombosis/pulmonary embolus.   POSTOPERATIVE DIAGNOSES:  Deep venous thrombosis/pulmonary embolus.   PROCEDURES PERFORMED:  1. Ultrasound access left common femoral vein.  2. Catheter in inferior vena cava.  3. Inferior venacavogram.  4. Inferior vena cava filter placement (Bard G2)   ANESTHESIA:  MAC and local.   COMPLICATIONS:  None.   PROCEDURE:  The patient was identified in the holding area and taken to  room 6.  He was placed supine on the table.  The left groin was prepped  and draped in a standard sterile fashion and a time-out was called.  Using ultrasound, the left common femoral vein was evaluated and found  to be patent.  It was accessed with an 18-gauge needle under ultrasound  guidance.  An 0.035 wire was advanced in a retrograde fashion into the  inferior vena cava under fluoroscopic visualization.  Over the wire a  marked pigtail catheter was placed at the caval bifurcation.  A contrast  injection was then performed to delineate the location of the renal  veins.  There were no aberrant renal veins or IVC thrombus.  The  diameter of the IVC was appropriate.  Next, the marker catheter was  removed over a Rosen wire.  The introducer sheath was placed over the  wire.  A Bard G2 filter was then advanced through the sheath and  successfully deployed the tip landing at the top of the L2 vertebral  body.  A fluoroscopic spot image was used to confirm the filter was in  good position and the sheath was then removed.  Manual pressure was held  for  hemostasis.  There were no complications.           ______________________________  V. Charlena Cross, MD  Electronically Signed     VWB/MEDQ  D:  11/21/2007  T:  11/22/2007  Job:  [id number]

## 2010-08-03 NOTE — Procedures (Signed)
DUPLEX DEEP VENOUS EXAM - LOWER EXTREMITY   INDICATION:  Follow up right lower extremity DVT.   HISTORY:  Edema:  Right lower extremity.  Trauma/Surgery:  No.  Pain:  Intermittent.  PE:  Yes.  Previous DVT:  Yes, right lower extremity common femoral vein,  superficial femoral vein, popliteal vein, and posterior tibial vein.  Anticoagulants:  Coumadin.  Other:   DUPLEX EXAM:                CFV   SFV   PopV  PTV    GSV                R  L  R  L  R  L  R   L  R  L  Thrombosis    P  o  +     +     +      o  Spontaneous   +  +  D     D     D      +  Phasic        +  +  D     D     D      +  Augmentation  D  +  D     D     D      +  Compressible  P  +  P     P     P      +  Competent   Legend:  + - yes  o - no  p - partial  D - decreased   IMPRESSION:  1. Evidence of partially occluding chronic deep venous thrombosis      extending from right distal common femoral vein through superficial      femoral vein, popliteal vein, and through posterior tibial vein.  2. Proximal right superficial femoral vein shows an area of possible      total occlusion.  3. Right lower extremity collaterals noted.  4. Left common femoral vein appears within normal limits.    _____________________________  V. Charlena Cross, MD   AS/MEDQ  D:  01/21/2008  T:  01/21/2008  Job:  [id number]

## 2010-08-03 NOTE — H&P (Signed)
NAME:  Joshua Peterson, Joshua Peterson.:  [id number]   MEDICAL RECORD NO.:  [id number]          PATIENT TYPE:  EMS   LOCATION:  [facility]JO                         FACILITY:  [facility]   PHYSICIAN:  Vania Rea, M.D. DATE OF BIRTH:  1940/06/13   DATE OF ADMISSION:  11/19/2007  DATE OF DISCHARGE:                              HISTORY & PHYSICAL   PRIMARY CARE PHYSICIAN:  Lucky Cowboy, M.D.   CHIEF COMPLAINT:  Swelling of the right leg for 4 days.   HISTORY OF PRESENT ILLNESS:  This is a 70 year old Caucasian gentleman  with a history of hypertension who visited his primary care physician  today because of pain and swelling of the right leg for the last 4 days.  The physician sent him to do an ultrasound which showed a deep venous  thrombosis and he was sent to the emergency room for management.  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  The patient denies any chest pain  or respiratory distress at rest.  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  He has no family  history of hypercoagulable states or clots.  He has no personal history  of cancer.   PAST MEDICAL HISTORY:  1. Hypertension.  2. Hyperlipidemia.   MEDICATIONS:  1. Atenolol unknown dose.  2. Aspirin daily.  3. Antilipidemic medication, unknown name.   ALLERGIES:  No known drug allergies.   SOCIAL HISTORY:  Remote history of tobacco use.  No tobacco use for the  past 25 years.  He drinks about 2 beers per week.  He denies illicit  drug use.   FAMILY HISTORY:  Significant for a father who died of cancer and a  mother who died of Alzheimer's.  He has a sister whose health status is  unknown.   REVIEW OF SYSTEMS:  Other than noted above significant  only for  colonoscopy, the last one done 4 months ago which revealed polyps.  He  was advised to come back for follow-up in 3 years.  There is no history  of constipation or GI bleed.  Otherwise a 10-point review of systems is  unremarkable.   PHYSICAL EXAMINATION:  GENERAL:  Healthy-looking, slightly obese,  Caucasian gentleman who looks young for his age.  VITAL SIGNS:  Temperature 98.5, pulse 77, respirations 20, blood  pressure 167/96.  He is saturating at 97% on room air.  HEENT:  His pupils are round and equal.  Mucous membranes are pink and  anicteric.  He has no cervical lymphadenopathy.  Jugular venous  distention to 4 cm above the sternal angle.  He has a left carotid  bruit.  CHEST:  Clear to auscultation bilaterally.  He is not tachypneic.  CARDIOVASCULAR:  Regular rhythm without murmurs.  ABDOMEN:  Obese, soft, and nontender.  EXTREMITIES:  Marked swelling  of the right lower extremity.  He has no  bony joint deformity.  NEUROLOGY:  Cranial nerves II-XII grossly intact with no focal  neurologic deficits.   LABORATORY DATA:  White count 10.2, hemoglobin 15.6, platelets 178.  He  has a normal differential on his white count.  His PT is 13.2, his INR  is 24.  Sodium 138, potassium 4.0, glucose 157, BUN 16, creatinine 1.34,  in other words normal.   ASSESSMENT & PLAN:  1. Right lower extremity deep venous thrombosis with bilateral      pulmonary emboli of some 4-10 days' duration with stable      cardiovascular system; will admit this gentleman for      anticoagulation with Lovenox and transition to Coumadin.  2. Hypertension, uncontrolled.  We will resume atenolol, seek to get      dose.  Monitor his blood pressure and O2 saturation.  3. H/o Triglyceridemia; We will check his lipid status and restart his      antilipidemic medication.  4. Left carotid bruit.  We will get an ultrasound of his carotid      arteries.  Other plans as per orders.      Vania Rea,  M.D.  Electronically Signed     LC/MEDQ  D:  11/19/2007  T:  11/19/2007  Job:  [id number]   cc:   Lucky Cowboy, M.D.

## 2010-08-03 NOTE — Op Note (Signed)
NAME:  [name], [name]                  ACCOUNT NO.:  [id number]   MEDICAL RECORD NO.:  [id number]          PATIENT TYPE:  AMB   LOCATION:  SDS                          FACILITY:  [facility]   PHYSICIAN:  Juleen China IV, MDDATE OF BIRTH:  [birth year]   DATE OF PROCEDURE:  01/31/2008  DATE OF DISCHARGE:  01/31/2008                               OPERATIVE REPORT   PREOPERATIVE DIAGNOSIS:  Inferior vena cava filter.   POSTOPERATIVE DIAGNOSIS:  Inferior vena cava filter.   PROCEDURES:  1. Ultrasound access right internal jugular vein.  2. Catheter in inferior vena cava.  3. Inferior vena cavogram.  4. Foreign body retrieval (inferior vena cava filter).   INDICATIONS:  This 70 year old gentleman has undergone placement of IVC  filter.  He now comes in for retrieval.  He has been on Coumadin, which  has been stopped.   PROCEDURE:  The patient was identified in the holding area and taken to  the room where he was placed supine on the table.  The right neck was  prepped and draped in standard sterile fashion.  Time-out was called.  The right internal jugular vein is I felt to be widely patent.  A 1%  lidocaine was used for local anesthesia.  The right internal jugular  vein was accessed with a micropuncture needle.  An 0.018 mandril wire  was then advanced into the heart under fluoroscopic visualization.  Micropuncture sheath was then placed.  Through the micropuncture sheath,  a Bentson wire was placed.  The micropuncture sheath was removed and an  Omni flush catheter was placed.  The Omniflush catheter was directed  into the inferior vena cava below the level of the IVC filter.  A  contrast IVC venogram was performed.  This revealed that the filter was  in good position and there was no thrombus within the filter.  Next over  the Bentson wire, a 7-French sheath was placed.  A 25-mm goose-necked  snare was used to snare the hook on the IVC filter.  Once this was  grasped, the 7-French  sheath was advanced down over to engage the  filter.  The filter was then withdrawn through the sheath.  The filter  came out with minimal difficulty.  Next, the sheath and the filter  withdrawn from the patient's body and manual pressure was held until  hemostasis was achieved.  There were no complications.   IMPRESSION:  Successful removal of inferior vena cava filter.           ______________________________  V. Charlena Cross, MD  Electronically Signed    VWB/MEDQ  D:  01/31/2008  T:  01/31/2008  Job:  [id number]   cc:   Lonia Blood, M.D.  Lucky Cowboy, M.D.

## 2010-08-03 NOTE — Consult Note (Signed)
NAME:  Joshua Peterson, Joshua Peterson                  ACCOUNT NO.:  [id number]   MEDICAL RECORD NO.:  [id number]          PATIENT TYPE:  INP   LOCATION:  2013                         FACILITY:  [facility]   PHYSICIAN:  Juleen China IV, MDDATE OF BIRTH:  1940/10/18   DATE OF CONSULTATION:  11/20/2007  DATE OF DISCHARGE:                                 CONSULTATION   CONSULTING SERVICES:  [facility] team.   REASON FOR CONSULTATION:  IVC filter.   HISTORY:  This is a 70 year old gentleman who presented to the emergency  department with new-onset right leg swelling.  It had present  approximately 3 days and had been associated with mild discomfort.  He  obtained a venous duplex which revealed a thrombus within his common  femoral vein extending down to his tibial veins.  The patient was also  evaluated for shortness of breath and found to have bilateral pulmonary  emboli.  The patient has not been on anticoagulation.  He has no history  of DVT.  There is no history of PE.  There is no family history for  hypercoagulable state.  The patient's only risk factor is that he has  prolonged period of immobilization.   PAST MEDICAL HISTORY:  1. Hypertension.  2. Hyperlipidemia.   SOCIAL HISTORY:  He is a former smoker and occasional drinker.   FAMILY HISTORY:  Negative for hypercoagulable states.   REVIEW OF SYSTEMS:  Otherwise negative except for right leg swelling and  discomfort.   ALLERGIES:  None.   MEDICATIONS:  Atenolol and aspirin.   PHYSICAL EXAMINATION:  VITAL SIGNS:  Heart rate is 69, respirations 20,  O2 saturations were 97% on room air, and blood pressure of 145/79.  GENERAL:  He is well appearing, in no acute distress.  CARDIOVASCULAR:  Regular rate and rhythm.  PULMONARY:  Clear bilaterally.  ABDOMEN:  Soft and obese.  EXTREMITIES:  Right leg is edematous.  Pedal pulses are palpable.  NEUROLOGIC:  Function is intact.   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  Request has been made for an inferior vena cava  filter.   I had a long conversation with the patient regarding risks and benefits  of an IVC filter.  I told him that this is not an absolute indication  for filter since he has not demonstrated pulmonary emboli while on  anticoagulation.  However, given the clot burden that remains as well as  the clot burden in his lungs, I feel it is reasonable to place a  temporary inferior vena cava filter.  We discussed the risk of caval  thrombosis as well as the possibility of not being able to retrieve the  filter down the road.  He wishes to proceed with __________ IVC filter  placement and is scheduled for Wednesday, November 21, 2007.  The  patient will require follow up in my office 2 months following his  procedure for duplex ultrasound to evaluate for removal of the filter.  ______________________________  V. Charlena Cross, MD  Electronically Signed     VWB/MEDQ  D:  11/21/2007  T:  11/22/2007  Job:  [id number]

## 2010-08-06 NOTE — Op Note (Signed)
[place]  Patient:    JC, VERON Visit Number: [id number] MRN: [id number]          Service Type: DSU Location: [facility] Attending Physician:  Katha Cabal Dictated by:   Thorn[place] Park Daphine Deutscher, M.D. Proc. Date: 01/03/01 Admit Date:  01/03/2001   CC:         Marinus Maw, M.D.                           Operative Report  CCS# [id number]  PREOPERATIVE DIAGNOSIS:  Right posterior lymphadenopathy.  POSTOPERATIVE DIAGNOSIS:  Right posterior lymphadenopathy.  OPERATION PERFORMED:  Right deep cervical node biopsy.  SURGEON:  Thornton Park. Daphine Deutscher, M.D.  ANESTHESIA:  MAC.  DESCRIPTION OF PROCEDURE:  Mr. Neisen had the above-mentioned node localized with him in the left lateral decubitus position.  This was marked and then prepped with Betadine and draped sterilely.  I first infiltrated the skin with 1% lidocaine and made an incision along the skin lines in that area.  I could feel the area.  I divided the platysma muscle and then used blunt dissection to dissect right down to the node.  At that point I got into the capsule of the lymph node which was about 1.5 cm in greatest dimension.  I carefully dissected around it and came up on its vascular attachments and checked these and clipped these with small clips.  Posteriorly I identified a nerve, the stimulation of which caused some muscle contraction.  This was avoided and not manipulated.  After securing the vascular inflow and outflow to the node, I went with small clips.  I went ahead and removed it.  There was essentially no bleeding or lymphatic leaks noted.  The wound was then closed with 4-0 Vicryl in the platysma layer and the subcutaneous and subcuticular layers.  Benzoin and Steri-Strips were used on the skin.  The patient tolerated the procedure well.  He will be given Maxidone to take for pain and will be followed up in the office in about two weeks. Dictated by:   Thornton Park Daphine Deutscher,  M.D. Attending Physician:  Katha Cabal DD:  01/03/01 TD:  01/04/01 Job: [id number]

## 2010-12-21 LAB — GLUCOSE, CAPILLARY
Glucose-Capillary: 124 — ABNORMAL HIGH
Glucose-Capillary: 149 — ABNORMAL HIGH

## 2010-12-21 LAB — POCT I-STAT, CHEM 8
Calcium, Ion: 1.15
Glucose, Bld: 155 — ABNORMAL HIGH
HCT: 40
Hemoglobin: 13.6
TCO2: 24

## 2010-12-22 LAB — PROTIME-INR
INR: 1.2
INR: 1.3
INR: 1.7 — ABNORMAL HIGH
Prothrombin Time: 16.7 — ABNORMAL HIGH
Prothrombin Time: 25.4 — ABNORMAL HIGH

## 2010-12-22 LAB — CBC
HCT: 42.1
Hemoglobin: 14.8
MCV: 95.2
Platelets: 155
RBC: 4.39
RBC: 4.45
RDW: 14
WBC: 9.3
WBC: 9.4

## 2010-12-22 LAB — COMPREHENSIVE METABOLIC PANEL
ALT: 21
AST: 16
Albumin: 3.5
Alkaline Phosphatase: 36 — ABNORMAL LOW
CO2: 27
Chloride: 101
Creatinine, Ser: 1.42
GFR calc Af Amer: >60
GFR calc non Af Amer: 50 — ABNORMAL LOW
Potassium: 4.1
Total Bilirubin: 0.9

## 2010-12-22 LAB — LIPID PANEL
HDL: 16 — ABNORMAL LOW
Total CHOL/HDL Ratio: 9.4
VLDL: 71 — ABNORMAL HIGH

## 2010-12-22 LAB — BASIC METABOLIC PANEL
Calcium: 9.2
GFR calc Af Amer: >60
GFR calc non Af Amer: >60
Glucose, Bld: 220 — ABNORMAL HIGH
Potassium: 4
Sodium: 137

## 2011-02-14 ENCOUNTER — Other Ambulatory Visit: Payer: Self-pay | Admitting: Internal Medicine

## 2011-02-14 ENCOUNTER — Ambulatory Visit
Admission: RE | Admit: 2011-02-14 | Discharge: 2011-02-14 | Disposition: A | Discharge status: Home / Self Care | Payer: 59 | Source: Ambulatory Visit | Attending: Internal Medicine | Admitting: Internal Medicine

## 2011-02-14 DIAGNOSIS — R52 Pain, unspecified: Secondary | ICD-10-CM

## 2011-02-14 DIAGNOSIS — R609 Edema, unspecified: Secondary | ICD-10-CM

## 2011-02-14 IMAGING — US US EXTREM LOW VENOUS*L*
1 series · 13 of 24 positions shown · non-contrast
Comparison: None

CLINICAL DATA: Left lower extremity pain and swelling question DVT,
past history of pulmonary embolism and DVT

LEFT LOWER EXTREMITY VENOUS DUPLEX ULTRASOUND
TECHNIQUE: Gray-scale sonography with graded compression, as well
as color Doppler and duplex ultrasound, were performed to evaluate
the deep venous system of the lower extremity from the level of the
common femoral vein through the popliteal and proximal calf veins.
Spectral Doppler was utilized to evaluate flow at rest and with
distal augmentation maneuvers.

[Series 1: us extrem low venous*left* · 13 of 40 slices shown]
[im 1/40]
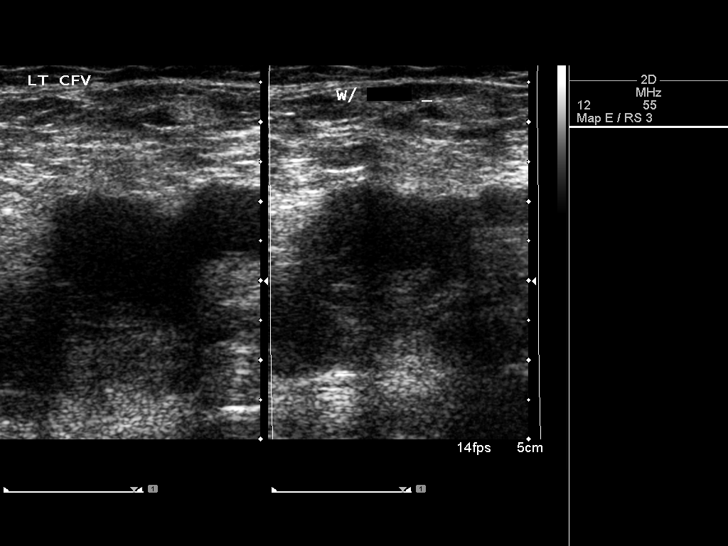
[im 4/40]
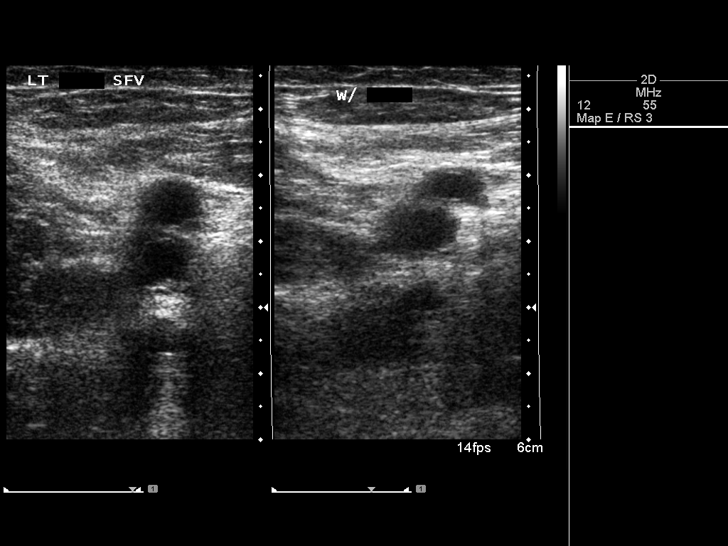
[im 7/40]
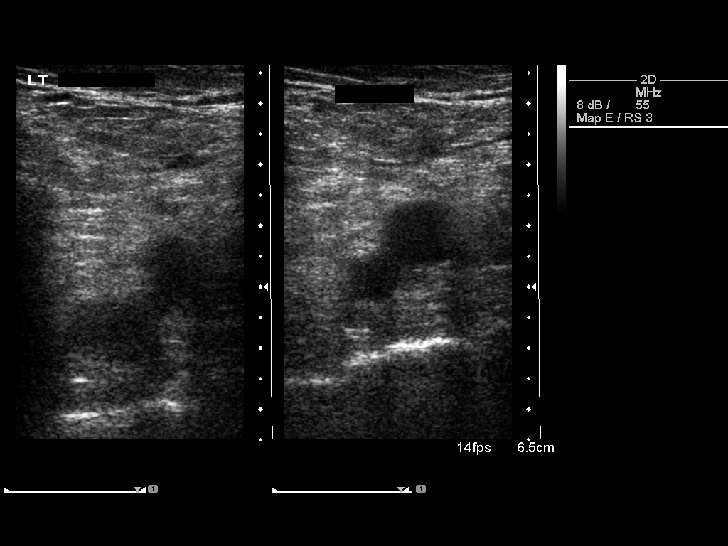
[im 11/40]
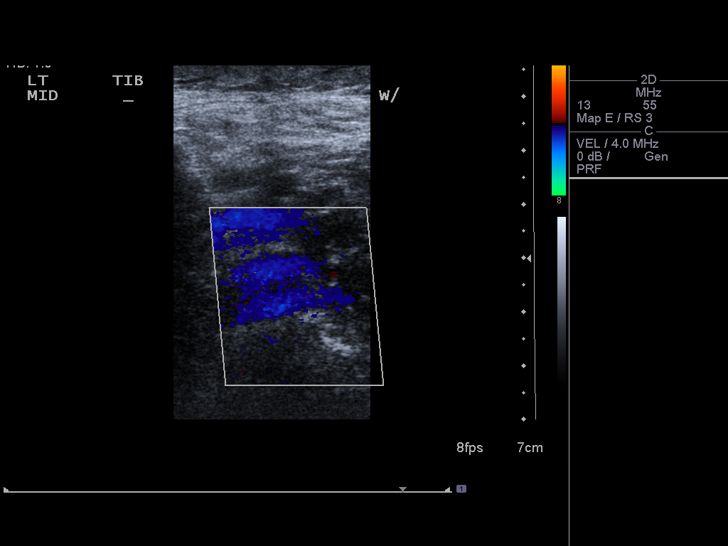
[im 14/40]
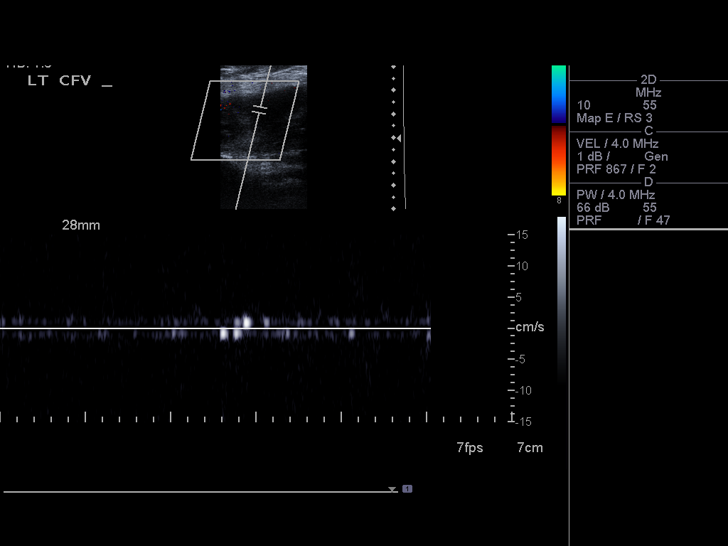
[im 17/40]
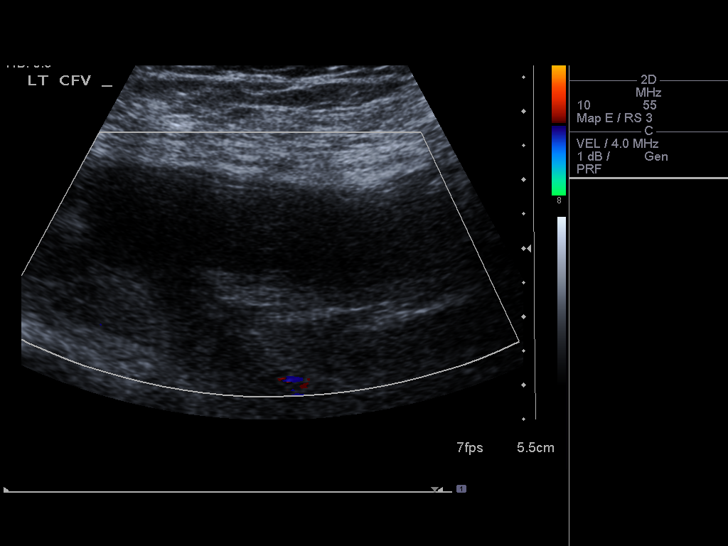
[im 21/40]
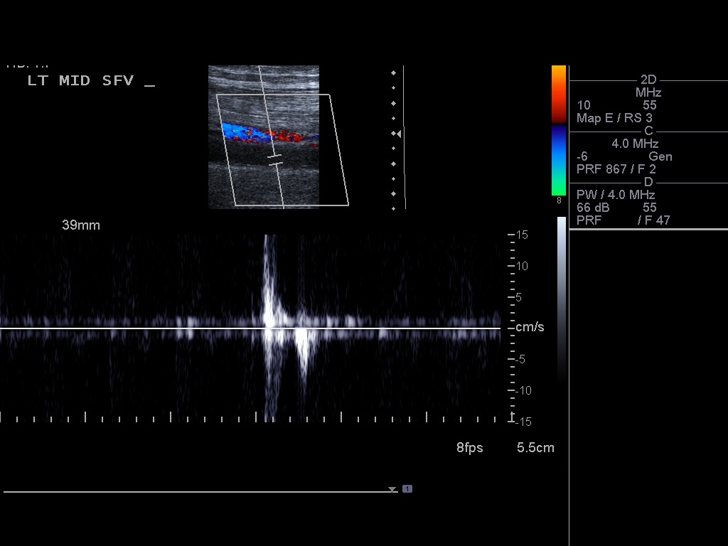
[im 23/40]
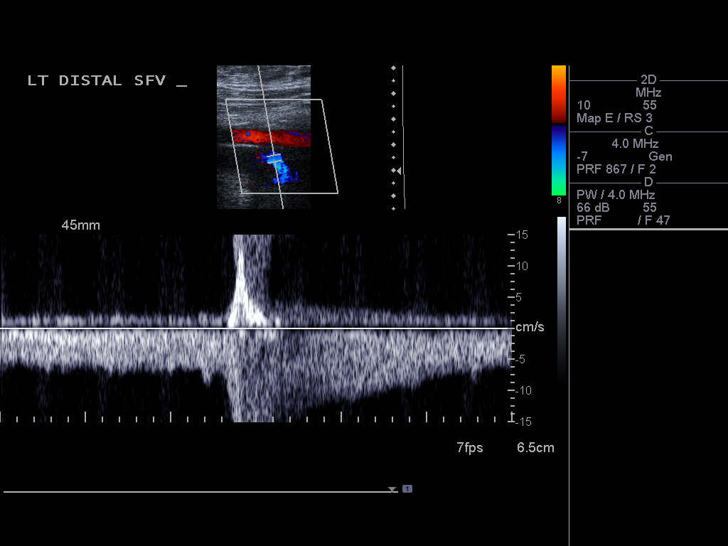
[im 26/40]
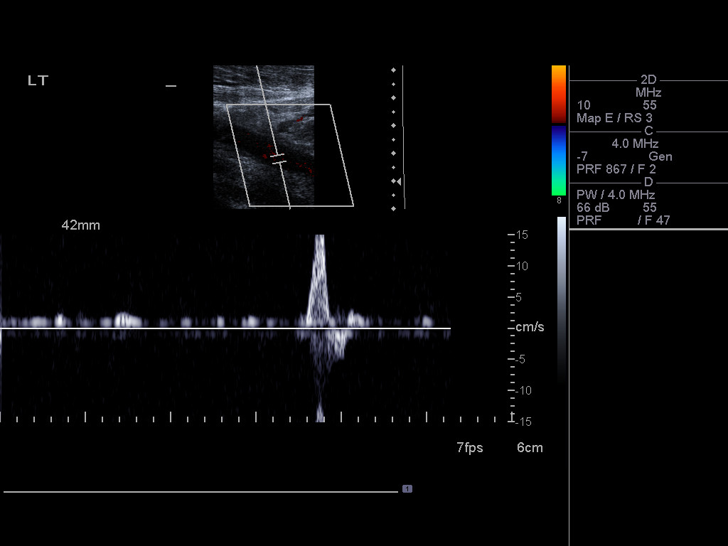
[im 29/40]
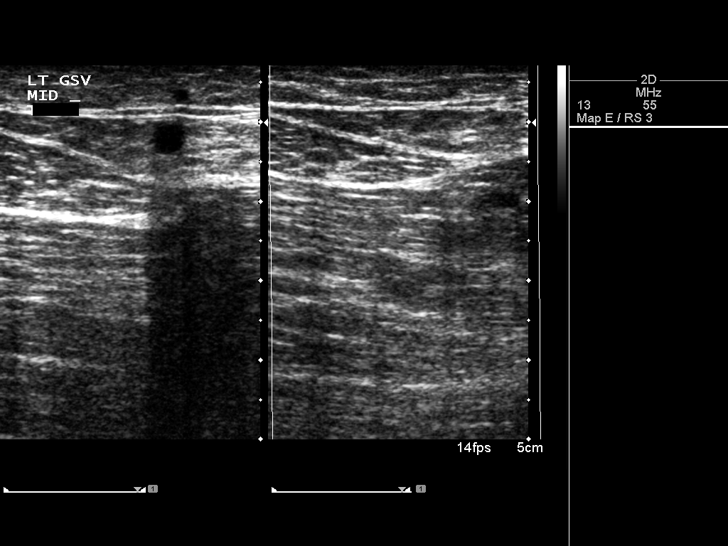
[im 33/40]
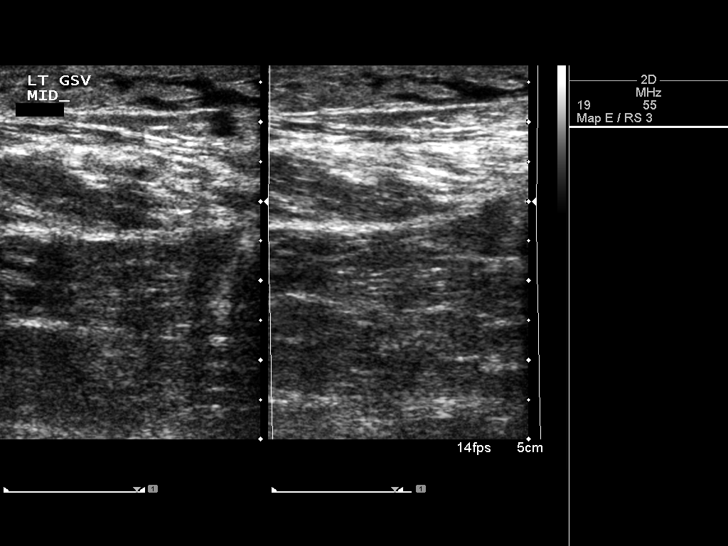
[im 36/40]
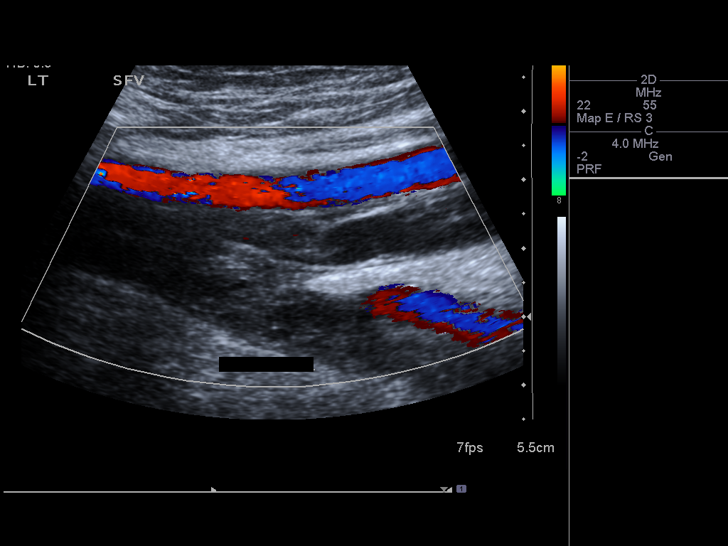
[im 40/40]
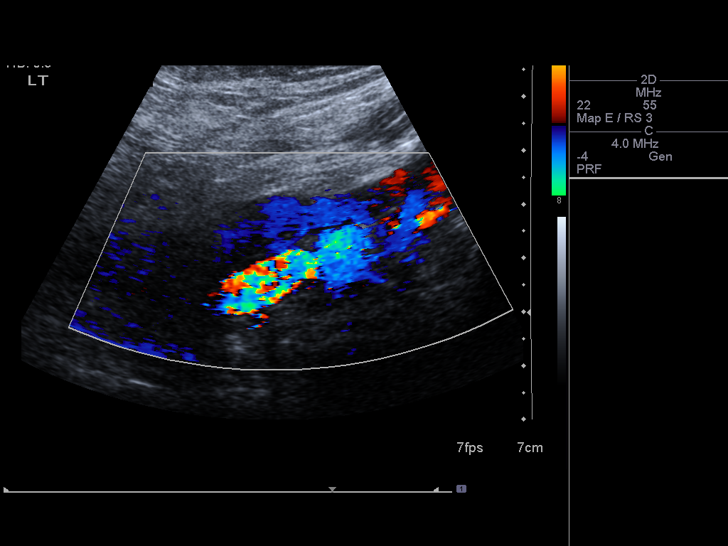

[13 of 24 positions shown; findings below may reference images not displayed]

FINDINGS: Hypoechoic acute-appearing thrombus is identified within the left
common femoral vein, femoral vein, profunda femoral vein, and
popliteal vein consistent with deep venous thrombosis.   Thrombus
is also identified at the saphenofemoral junction into the proximal
greater saphenous vein.  Spontaneous venous flow is absent or
severely diminished at the observed segments with impaired venous
compressibility.
IMPRESSION: Acute-appearing deep venous thrombosis of the left lower extremity
as above.

Critical Value/emergent results were called by telephone at the
time of interpretation on [DATE]  at [G4] hours  to  Dr.
GIORGOS-SOPHIE, who verbally acknowledged these results.

## 2011-02-21 ENCOUNTER — Other Ambulatory Visit: Payer: Self-pay | Admitting: Internal Medicine

## 2011-02-21 ENCOUNTER — Ambulatory Visit
Admission: RE | Admit: 2011-02-21 | Discharge: 2011-02-21 | Disposition: A | Discharge status: Home / Self Care | Payer: 59 | Source: Ambulatory Visit | Attending: Internal Medicine | Admitting: Internal Medicine

## 2011-02-21 DIAGNOSIS — I824Y9 Acute embolism and thrombosis of unspecified deep veins of unspecified proximal lower extremity: Secondary | ICD-10-CM

## 2011-02-21 DIAGNOSIS — R0602 Shortness of breath: Secondary | ICD-10-CM

## 2011-02-21 DIAGNOSIS — I82409 Acute embolism and thrombosis of unspecified deep veins of unspecified lower extremity: Secondary | ICD-10-CM

## 2011-02-21 IMAGING — CT CT ANGIO CHEST
1 of 6 series · 14 of 36 positions shown · IV contrast ([ID] OMNI 300)
Comparison: [DATE]

CLINICAL DATA: Positive DVT left leg.  Shortness of breath.
Evaluate for pulmonary embolus.

CT ANGIOGRAPHY CHEST WITH CONTRAST
TECHNIQUE: Multidetector CT imaging of the chest was performed
using the standard protocol during bolus administration of
intravenous contrast.  Multiplanar CT image reconstructions
including MIPs were obtained to evaluate the vascular anatomy.
Contrast: 100mL OMNIPAQUE IOHEXOL 300 MG/ML IV SOLN

[Series 4: pe 1.25 · axial · 0.70mm/px · z∈[-376,-64]mm · 14 of 361 slices shown]
[im 25/361  lung]
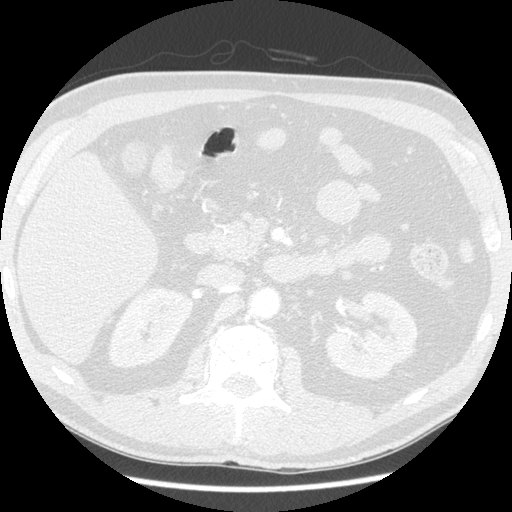
[im 49/361  mediastinal]
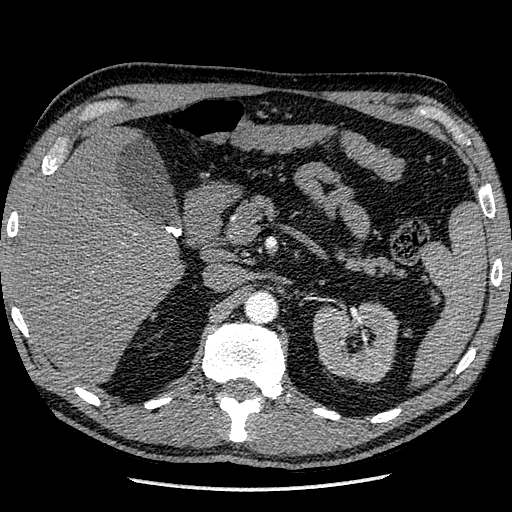
[im 73/361  lung]
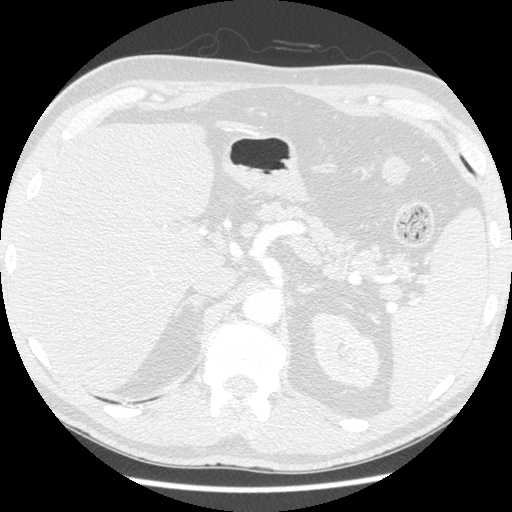
[im 97/361  mediastinal]
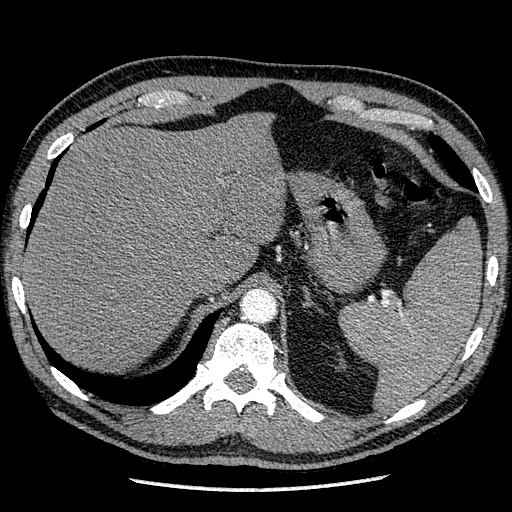
[im 121/361  lung]
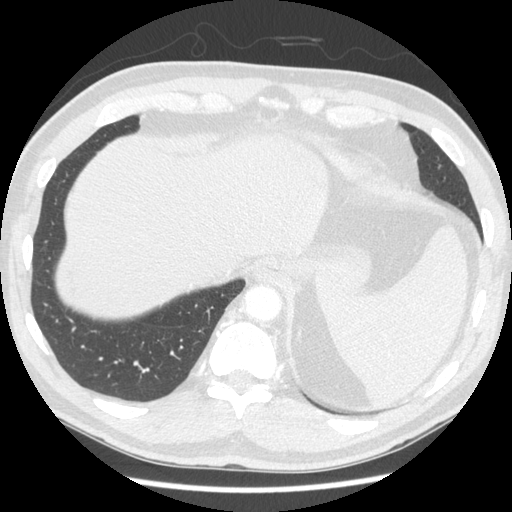
[im 145/361  mediastinal]
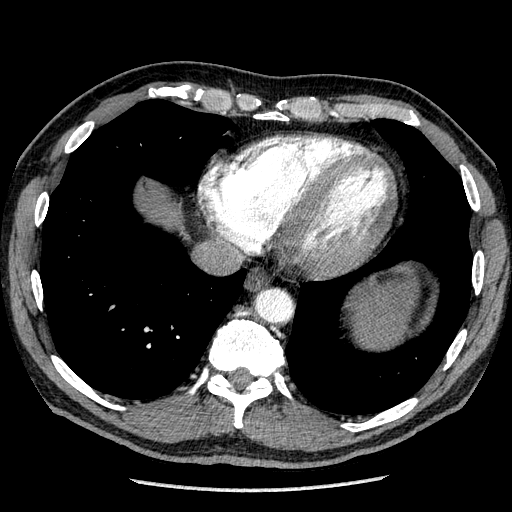
[im 169/361  lung]
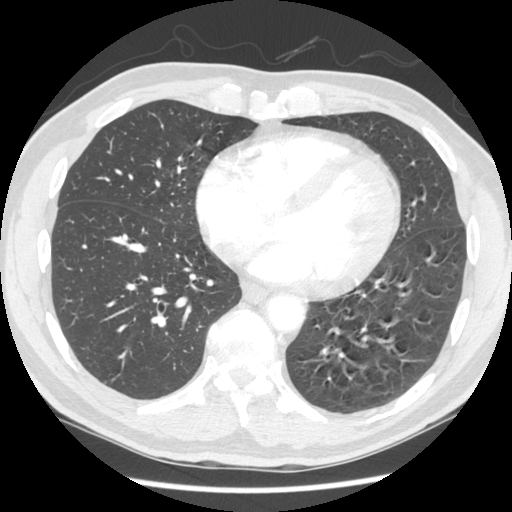
[im 193/361  mediastinal]
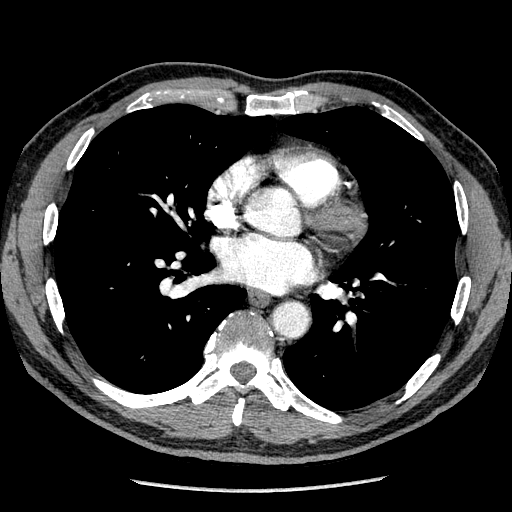
[im 217/361  lung]
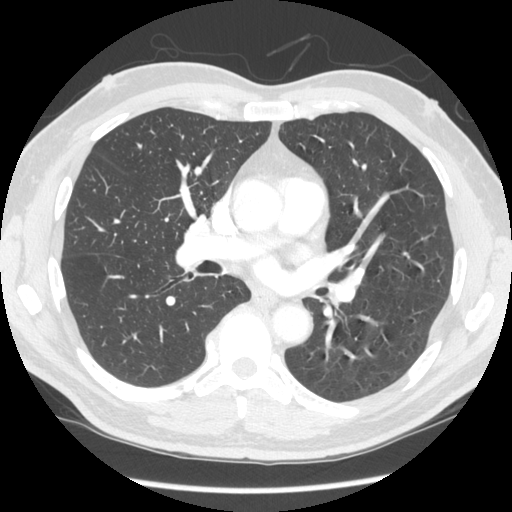
[im 241/361  mediastinal]
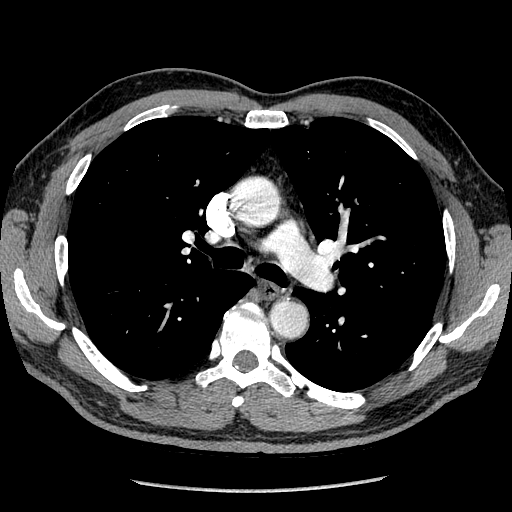
[im 265/361  lung]
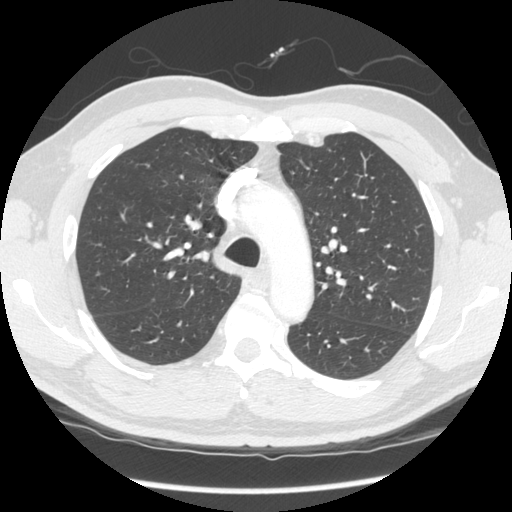
[im 289/361  mediastinal]
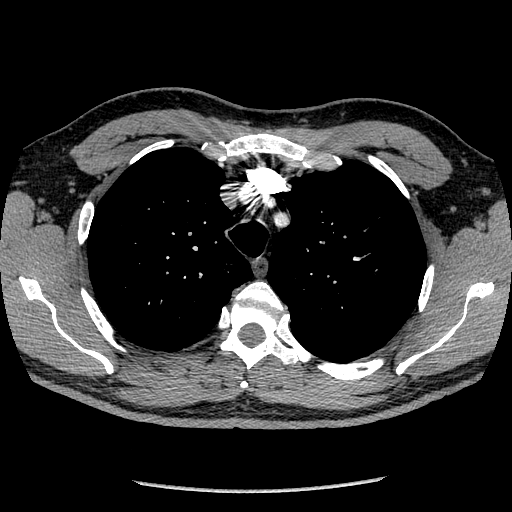
[im 313/361  lung]
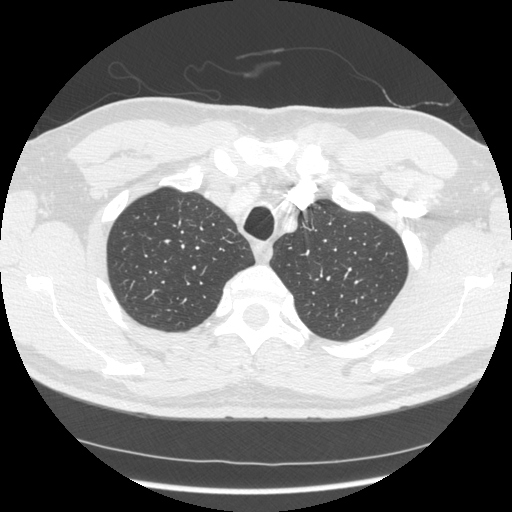
[im 337/361  mediastinal]
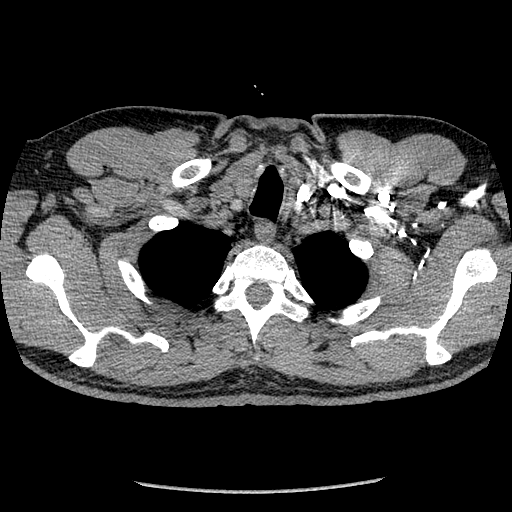

[14 of 36 positions shown; findings below may reference images not displayed]

FINDINGS: No enlarged axillary lymph nodes.

There is no enlarged mediastinal or hilar lymph nodes.

No pericardial or pleural effusion identified.

There is no airspace consolidation identified.

Within the medial aspect of the right upper lobe there is an area
of central parenchymal spiculation measuring 1.4 x 0.9 cm.
Unchanged from previous exam.

Within the posterior right lung base there is a pulmonary nodule
which measures 0.4 cm, image 104.  Also unchanged from previous
exam.

No new pulmonary nodules or masses identified.

Limited imaging through the upper abdomen is significant for
gallstones.

Review of the MIP images confirms the above findings.
IMPRESSION: 1.  No acute cardiopulmonary abnormalities.  Specifically there are
no features to suggest acute pulmonary embolus.
2.  Right upper lobe and right posterior lung base nodular
densities are stable from [F0].  Likely benign.

## 2011-02-21 MED ORDER — IOHEXOL 300 MG/ML  SOLN
100.0000 mL | Freq: Once | INTRAMUSCULAR | Status: AC | PRN
  Administered 2011-02-21: 100 mL via INTRAVENOUS

## 2011-05-17 ENCOUNTER — Other Ambulatory Visit: Payer: Self-pay

## 2011-05-17 ENCOUNTER — Observation Stay (HOSPITAL_COMMUNITY)
Admission: EM | Admit: 2011-05-17 | Discharge: 2011-05-19 | Disposition: A | Discharge status: Home / Self Care | Payer: 59 | Attending: Internal Medicine | Admitting: Internal Medicine

## 2011-05-17 ENCOUNTER — Encounter (HOSPITAL_COMMUNITY): Payer: Self-pay | Admitting: *Deleted

## 2011-05-17 DIAGNOSIS — Z7901 Long term (current) use of anticoagulants: Secondary | ICD-10-CM | POA: Insufficient documentation

## 2011-05-17 DIAGNOSIS — I10 Essential (primary) hypertension: Secondary | ICD-10-CM | POA: Insufficient documentation

## 2011-05-17 DIAGNOSIS — R112 Nausea with vomiting, unspecified: Secondary | ICD-10-CM | POA: Insufficient documentation

## 2011-05-17 DIAGNOSIS — K5289 Other specified noninfective gastroenteritis and colitis: Secondary | ICD-10-CM | POA: Insufficient documentation

## 2011-05-17 DIAGNOSIS — Z7982 Long term (current) use of aspirin: Secondary | ICD-10-CM | POA: Insufficient documentation

## 2011-05-17 DIAGNOSIS — I82409 Acute embolism and thrombosis of unspecified deep veins of unspecified lower extremity: Secondary | ICD-10-CM

## 2011-05-17 DIAGNOSIS — E1169 Type 2 diabetes mellitus with other specified complication: Secondary | ICD-10-CM | POA: Diagnosis present

## 2011-05-17 DIAGNOSIS — R42 Dizziness and giddiness: Secondary | ICD-10-CM | POA: Insufficient documentation

## 2011-05-17 DIAGNOSIS — Z86718 Personal history of other venous thrombosis and embolism: Secondary | ICD-10-CM | POA: Diagnosis not present

## 2011-05-17 DIAGNOSIS — R55 Syncope and collapse: Secondary | ICD-10-CM

## 2011-05-17 DIAGNOSIS — E1122 Type 2 diabetes mellitus with diabetic chronic kidney disease: Secondary | ICD-10-CM | POA: Diagnosis present

## 2011-05-17 DIAGNOSIS — E119 Type 2 diabetes mellitus without complications: Secondary | ICD-10-CM | POA: Insufficient documentation

## 2011-05-17 DIAGNOSIS — R197 Diarrhea, unspecified: Secondary | ICD-10-CM | POA: Insufficient documentation

## 2011-05-17 DIAGNOSIS — R0989 Other specified symptoms and signs involving the circulatory and respiratory systems: Secondary | ICD-10-CM | POA: Diagnosis present

## 2011-05-17 DIAGNOSIS — E785 Hyperlipidemia, unspecified: Secondary | ICD-10-CM | POA: Insufficient documentation

## 2011-05-17 DIAGNOSIS — Z79899 Other long term (current) drug therapy: Secondary | ICD-10-CM | POA: Insufficient documentation

## 2011-05-17 DIAGNOSIS — I951 Orthostatic hypotension: Principal | ICD-10-CM | POA: Insufficient documentation

## 2011-05-17 HISTORY — DX: Essential (primary) hypertension: I10

## 2011-05-17 LAB — CBC
Hemoglobin: 14.6 g/dL (ref 13.0–17.0)
Platelets: 118 10*3/uL — ABNORMAL LOW (ref 150–400)
RBC: 4.66 MIL/uL (ref 4.22–5.81)
WBC: 13.7 10*3/uL — ABNORMAL HIGH (ref 4.0–10.5)

## 2011-05-17 LAB — COMPREHENSIVE METABOLIC PANEL
AST: 23 U/L (ref 0–37)
BUN: 23 mg/dL (ref 6–23)
CO2: 24 mEq/L (ref 19–32)
Calcium: 9.8 mg/dL (ref 8.4–10.5)
Creatinine, Ser: 1.22 mg/dL (ref 0.50–1.35)
GFR calc Af Amer: 68 mL/min — ABNORMAL LOW (ref >90)
GFR calc non Af Amer: 58 mL/min — ABNORMAL LOW (ref >90)
Glucose, Bld: 183 mg/dL — ABNORMAL HIGH (ref 70–99)
Total Bilirubin: 0.9 mg/dL (ref 0.3–1.2)

## 2011-05-17 LAB — POCT I-STAT TROPONIN I: Troponin i, poc: 0 ng/mL (ref 0.00–0.08)

## 2011-05-17 LAB — PROTIME-INR
INR: 2.5 — ABNORMAL HIGH (ref 0.00–1.49)
Prothrombin Time: 27.4 seconds — ABNORMAL HIGH (ref 11.6–15.2)

## 2011-05-17 MED ORDER — ONDANSETRON HCL 4 MG/2ML IJ SOLN
4.0000 mg | Freq: Once | INTRAMUSCULAR | Status: AC
  Administered 2011-05-17: 4 mg via INTRAVENOUS
  Filled 2011-05-17: qty 2

## 2011-05-17 MED ORDER — ACETAMINOPHEN 325 MG PO TABS
650.0000 mg | ORAL_TABLET | Freq: Once | ORAL | Status: AC
  Administered 2011-05-17: 650 mg via ORAL
  Filled 2011-05-17: qty 2

## 2011-05-17 MED ORDER — ONDANSETRON HCL 4 MG/2ML IJ SOLN
INTRAMUSCULAR | Status: AC
  Administered 2011-05-17: 4 mg
  Filled 2011-05-17: qty 2

## 2011-05-17 MED ORDER — SODIUM CHLORIDE 0.9 % IV BOLUS (SEPSIS)
500.0000 mL | Freq: Once | INTRAVENOUS | Status: AC
  Administered 2011-05-17: 500 mL via INTRAVENOUS

## 2011-05-17 MED ORDER — ONDANSETRON HCL 4 MG/2ML IJ SOLN: 4.0000 mg | Freq: Once | INTRAMUSCULAR | Status: DC

## 2011-05-17 NOTE — ED Notes (Signed)
ZOX:[id number]<AV> Expected date:<BR> Expected time:<BR> Means of arrival:<BR> Comments:<BR> EMS 241 GC, 88 yom syncope w n/v/d

## 2011-05-17 NOTE — ED Provider Notes (Signed)
History     CSN: [id number]  Arrival date & time 05/17/11  2042   First MD Initiated Contact with Patient 05/17/11 2138      Chief Complaint  Patient presents with  . Nausea  . Emesis  . Loss of Consciousness     HPI Onset - just prior to arrival Course  - improved Improved by -rest Worsened by - position  Acute onset nausea followed by an episode of syncope and then nonbloody emesis of partially digested food & 1 episode of non bloody diarrhea.  Accompanied by b/l frontal HA & lightheadedness (3/10).  Wife felt he was sluggish yesterday with answering questions.  Has happened before, last time was 6 years ago.  No head trauma.  He felt a "sensation" prior to LOC, layed on cold floor, which felt better, and wife helped move him to chair.  Wife notes LOC lasted 3-5 min, and called EMS as he appeared grey in color & felt clammy.  No seizure like activity, no post ictal type state.  Chronic tinnitus.  No CP/SOB/palpitations/change in vision.    Past Medical History  Diagnosis Date  . DVT (deep venous thrombosis)     bilateral legs  . Diabetes mellitus   . Hypertension   . Hyperlipidemia     Past Surgical History  Procedure Date  . Ivc filter placed and removed (right leg)     Family History  Problem Relation Age of Onset  . Alzheimer's disease Mother   . Prostate cancer Father     History  Substance Use Topics  . Smoking status: Former Smoker    Quit date: 04/16/1991  . Smokeless tobacco: Not on file  . Alcohol Use: 1.2 oz/week    2 Cans of beer per week     ocassional (1-2 x a week)      Review of Systems  Constitutional: Positive for diaphoresis, appetite change and fatigue. Negative for fever.  HENT: Positive for tinnitus. Negative for hearing loss, facial swelling, trouble swallowing, neck pain and neck stiffness.   Eyes: Negative for photophobia, pain and visual disturbance.  Respiratory: Negative for cough, chest tightness, shortness of breath, wheezing  and stridor.   Cardiovascular: Negative for chest pain, palpitations and leg swelling.  Gastrointestinal: Positive for nausea, vomiting and diarrhea. Negative for abdominal pain, blood in stool and abdominal distention.  Genitourinary: Negative.   Musculoskeletal: Negative.   Skin: Positive for color change and pallor. Negative for rash.  Neurological: Positive for dizziness, syncope, weakness, light-headedness and headaches. Negative for tremors, seizures, facial asymmetry, speech difficulty and numbness.  Psychiatric/Behavioral: Negative.     Allergies  Review of patient's allergies indicates no known allergies.  Home Medications   Current Outpatient Rx  Name Route Sig Dispense Refill  . ASPIRIN EC 81 MG PO TBEC Oral Take 81 mg by mouth daily.    . FENOFIBRATE MICRONIZED 134 MG PO CAPS Oral Take 134 mg by mouth daily.    Marland Kitchen LOSARTAN POTASSIUM 50 MG PO TABS Oral Take 50 mg by mouth daily.    Marland Kitchen METFORMIN HCL ER 500 MG PO TB24 Oral Take 500 mg by mouth 2 (two) times daily.    Marland Kitchen RANITIDINE HCL 300 MG PO TABS Oral Take 300 mg by mouth daily.    Marland Kitchen TADALAFIL 5 MG PO TABS Oral Take 5 mg by mouth daily as needed.    . WARFARIN SODIUM 5 MG PO TABS Oral Take 5 mg by mouth daily.  BP 135/59  Pulse 84  Temp(Src) 98.8 F (37.1 C) (Oral)  Resp 15  SpO2 96%  Physical Exam  Nursing note and vitals reviewed. Constitutional: He is oriented to person, place, and time. He appears well-developed and well-nourished. No distress.  HENT:  Mouth/Throat: Oropharynx is clear and moist.  Eyes: Conjunctivae and EOM are normal. Pupils are equal, round, and reactive to light.  Neck: Normal range of motion. Neck supple. No tracheal deviation present. No thyromegaly present.  Cardiovascular: Normal rate, regular rhythm, normal heart sounds and intact distal pulses.  Exam reveals no gallop and no friction rub.   No murmur heard. Pulmonary/Chest: Breath sounds normal. No respiratory distress. He has no  wheezes. He has no rales. He exhibits no tenderness.  Abdominal: Soft. Bowel sounds are normal. He exhibits no distension and no mass. There is no tenderness. There is no rebound and no guarding.  Musculoskeletal: Normal range of motion. He exhibits edema (b/l 1+ pitting edema). He exhibits no tenderness (no calf tenderness).  Lymphadenopathy:    He has no cervical adenopathy.  Neurological: He is alert and oriented to person, place, and time. He displays normal reflexes. No cranial nerve deficit. He exhibits normal muscle tone. Coordination normal.       No nystagmus  Skin: Skin is warm and dry. He is not diaphoretic. No pallor.  Psychiatric: He has a normal mood and affect. His behavior is normal.   ED Course  Procedures (including critical care time)  Labs Reviewed  COMPREHENSIVE METABOLIC PANEL - Abnormal; Notable for the following:    Glucose, Bld 183 (*)    GFR calc non Af Amer 58 (*)    GFR calc Af Amer 68 (*)    All other components within normal limits  CBC - Abnormal; Notable for the following:    WBC 13.7 (*)    Platelets 118 (*)    All other components within normal limits  PROTIME-INR - Abnormal; Notable for the following:    Prothrombin Time 27.4 (*)    INR 2.50 (*)    All other components within normal limits  POCT I-STAT TROPONIN I    Date: 05/17/2011  Rate: 79  Rhythm: normal sinus rhythm  QRS Axis: left  Intervals: normal  ST/T Wave abnormalities: normal  Conduction Disutrbances:none  Narrative Interpretation:   Old EKG Reviewed: changes noted, prior EKG - sinus bradycardia       1. Syncope   2. History of pulmonary embolus (PE)   3. Nausea and vomiting       MDM  71 yo M with PMH significant for DVT/PE, DM, HTN p/w episode of syncope.  EKG not significant for dysrhythmia. Troponin wnl. Mild HA, and unlikely related to [facility].  PE is in the differential given history, but vitals stable & pt is not SOB.   11:18 PM - Nausea & HA improved, but unable  to perform orthostatics because he felt so dizzy.  In setting of diarrhea/dehydration & s/p syncope, he would benefit from overnight observation since he is now a fall risk on coumadin.    11:38 PM - Dr. Bebe Shaggy spoke with [facility] regarding admission.  We will repeat orthostatic vital signs.         Vernice Jefferson, MD 05/17/11 2339  I have personally seen and examined the patient.  I have discussed the plan of care with the resident.  I have reviewed the documentation on PMH/FH/Soc. History.  I have reviewed the documentation of the resident and  agree.   I have reviewed and agree with the ECG interpretation(s) documented by the resident.  Pt well appearing at this time, but had syncopal episode, given age/history recommend admit  Joya Gaskins, MD 05/18/11 0020

## 2011-05-17 NOTE — ED Notes (Signed)
Patient brought from home by EMS. Patient reports he was working out and he began to get a dry mouth, nauseated, diaphoretic and proceeded to black out. Patient states he was able to lower himself into a chair and regain consciousness immediately. Reports this has happened to him in the past. Currently, c/o some nausea and headache 3/10. Wife at bedside with patient.

## 2011-05-17 NOTE — ED Notes (Addendum)
Patient brought in from home by EMS. Per EMS, patient was working out and afterwards felt sick, diaphoretic, vomitted and passed out. Patient states he was able to catch himself on a chair and came back immediately. Wife of patient has had flu-like symptoms for the past week. Hx of DVTs; DM. EMS CBG=[id number]

## 2011-05-18 ENCOUNTER — Other Ambulatory Visit: Payer: Self-pay

## 2011-05-18 ENCOUNTER — Emergency Department (HOSPITAL_COMMUNITY): Payer: 59

## 2011-05-18 ENCOUNTER — Encounter (HOSPITAL_COMMUNITY): Payer: Self-pay | Admitting: Family Medicine

## 2011-05-18 DIAGNOSIS — E1169 Type 2 diabetes mellitus with other specified complication: Secondary | ICD-10-CM | POA: Diagnosis present

## 2011-05-18 DIAGNOSIS — R55 Syncope and collapse: Secondary | ICD-10-CM

## 2011-05-18 DIAGNOSIS — E1122 Type 2 diabetes mellitus with diabetic chronic kidney disease: Secondary | ICD-10-CM | POA: Diagnosis present

## 2011-05-18 DIAGNOSIS — R0989 Other specified symptoms and signs involving the circulatory and respiratory systems: Secondary | ICD-10-CM | POA: Diagnosis present

## 2011-05-18 DIAGNOSIS — Z86718 Personal history of other venous thrombosis and embolism: Secondary | ICD-10-CM | POA: Diagnosis not present

## 2011-05-18 LAB — BASIC METABOLIC PANEL
CO2: 22 mEq/L (ref 19–32)
Chloride: 104 mEq/L (ref 96–112)
Creatinine, Ser: 1.17 mg/dL (ref 0.50–1.35)
GFR calc Af Amer: 71 mL/min — ABNORMAL LOW (ref >90)
Potassium: 4 mEq/L (ref 3.5–5.1)

## 2011-05-18 LAB — TROPONIN I: Troponin I: <0.3 ng/mL (ref <0.30)

## 2011-05-18 LAB — CBC
HCT: 39.6 % (ref 39.0–52.0)
Hemoglobin: 14.2 g/dL (ref 13.0–17.0)
MCV: 88 fL (ref 78.0–100.0)
RDW: 13 % (ref 11.5–15.5)
WBC: 9.1 10*3/uL (ref 4.0–10.5)

## 2011-05-18 LAB — URINALYSIS, ROUTINE W REFLEX MICROSCOPIC
Bilirubin Urine: NEGATIVE
Ketones, ur: NEGATIVE mg/dL
Leukocytes, UA: NEGATIVE
Nitrite: NEGATIVE
Protein, ur: NEGATIVE mg/dL

## 2011-05-18 LAB — LIPID PANEL
HDL: 30 mg/dL — ABNORMAL LOW (ref >39)
LDL Cholesterol: 94 mg/dL (ref 0–99)
Total CHOL/HDL Ratio: 5.2 RATIO
Triglycerides: 156 mg/dL — ABNORMAL HIGH (ref <150)
VLDL: 31 mg/dL (ref 0–40)

## 2011-05-18 LAB — GLUCOSE, CAPILLARY
Glucose-Capillary: 132 mg/dL — ABNORMAL HIGH (ref 70–99)
Glucose-Capillary: 117 mg/dL — ABNORMAL HIGH (ref 70–99)

## 2011-05-18 LAB — URINE MICROSCOPIC-ADD ON

## 2011-05-18 LAB — PROTIME-INR: Prothrombin Time: 28.4 seconds — ABNORMAL HIGH (ref 11.6–15.2)

## 2011-05-18 IMAGING — CR DG CHEST 1V PORT
1 series · 1 of 1 positions shown · non-contrast
Comparison: [DATE]

CLINICAL DATA: Shortness of breath and nausea.

PORTABLE CHEST - 1 VIEW

[AP]
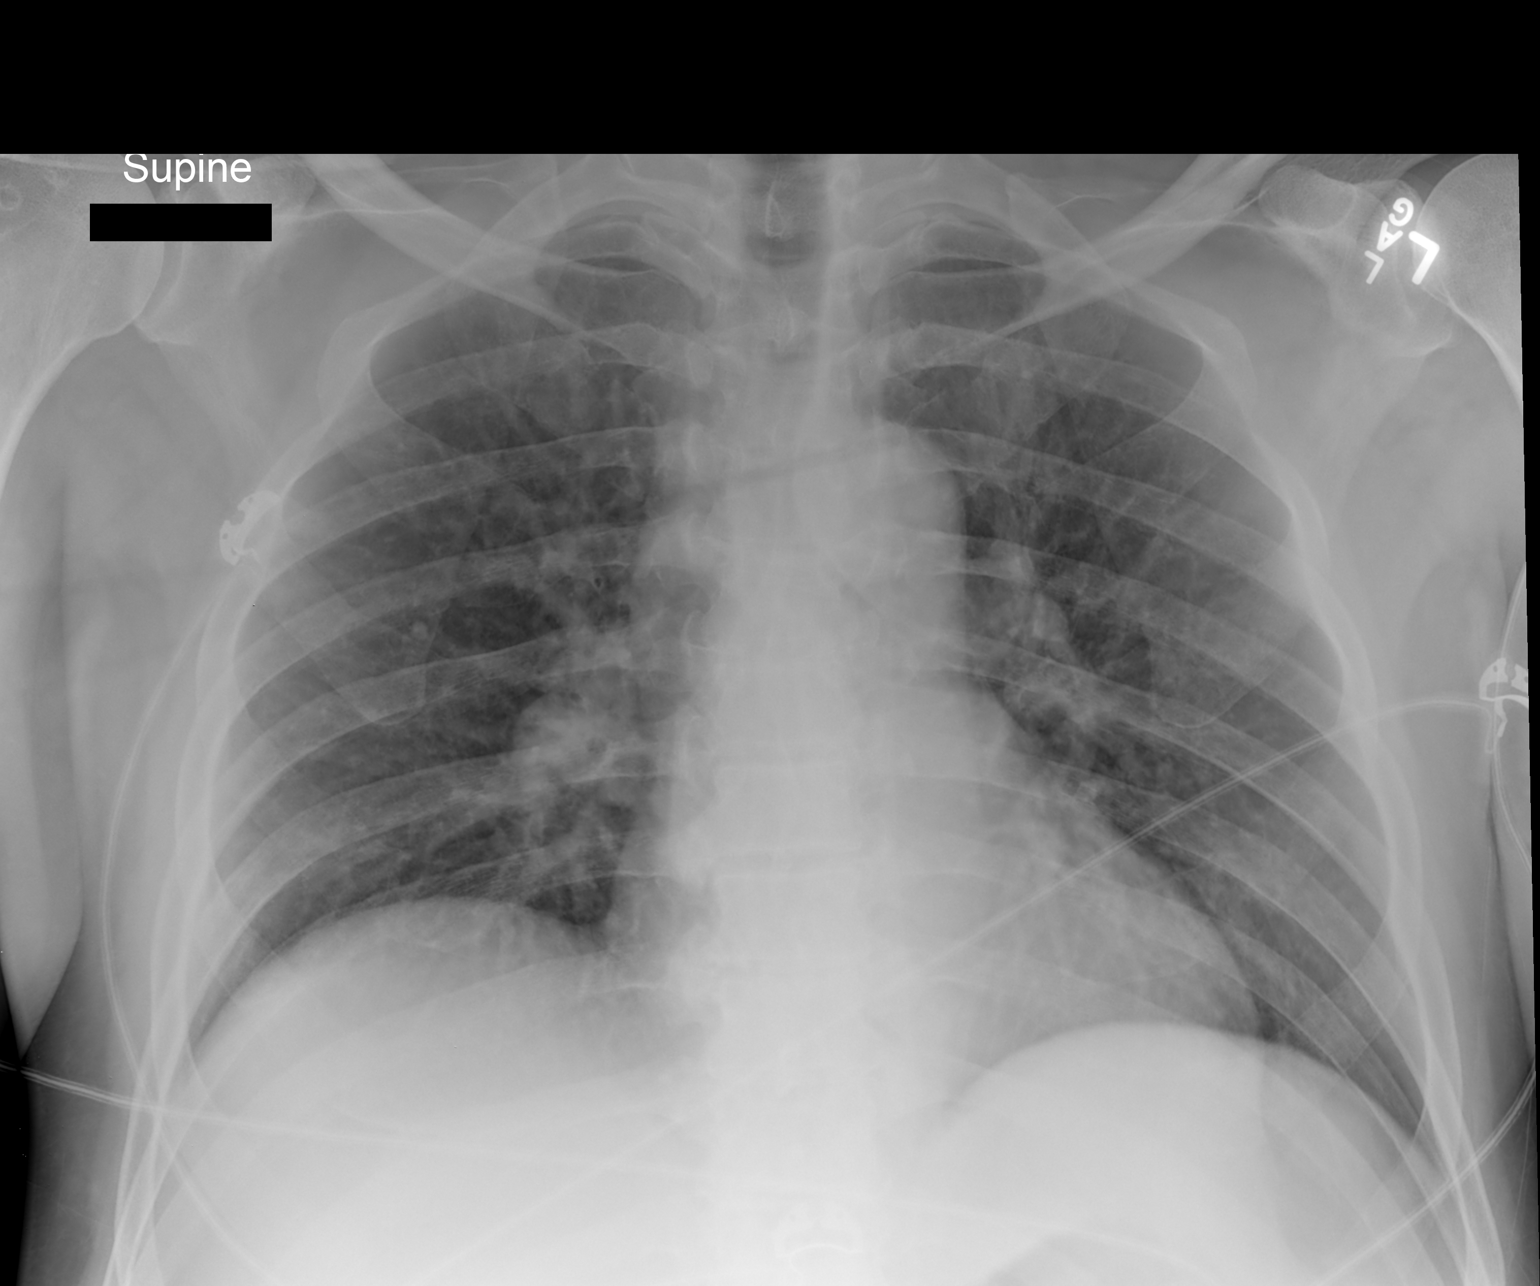

[1 of 1 positions shown; findings below may reference images not displayed]

FINDINGS: Decreased lung volumes with crowding of the central
vascular structures.  No focal airspace disease or edema.  Heart
size is within normal limits and the trachea is midline.  Negative
for a pneumothorax.
IMPRESSION: Low lung volumes without focal disease.

## 2011-05-18 IMAGING — CT CT HEAD W/O CM
2 series · 17 of 30 positions shown, 20 images · non-contrast
Comparison: None.

CLINICAL DATA: Syncope and nausea.

CT HEAD WITHOUT CONTRAST
TECHNIQUE: Contiguous axial images were obtained from the base of
the skull through the vertex without contrast.

[Series 2: head w/o · axial · non-contrast · 0.43mm/px · z∈[-3,+117]mm · 9 of 31 slices shown, 12 images]
[im 4/31  brain]
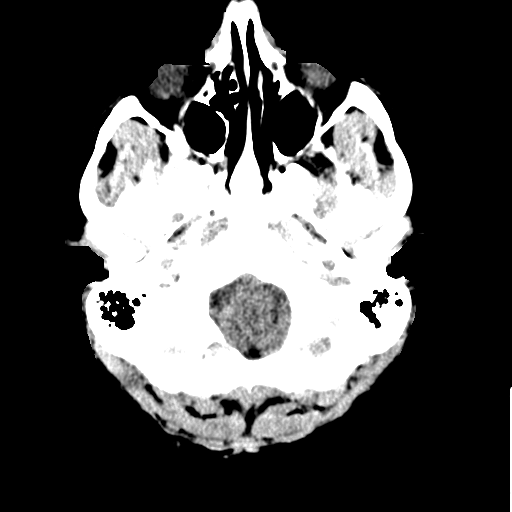
[im 4/31  bone]
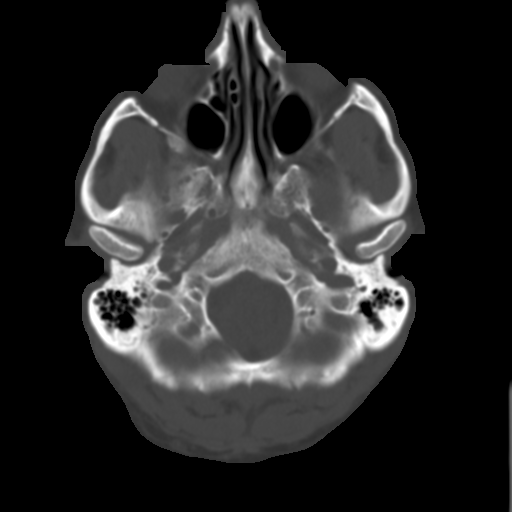
[im 7/31  brain]
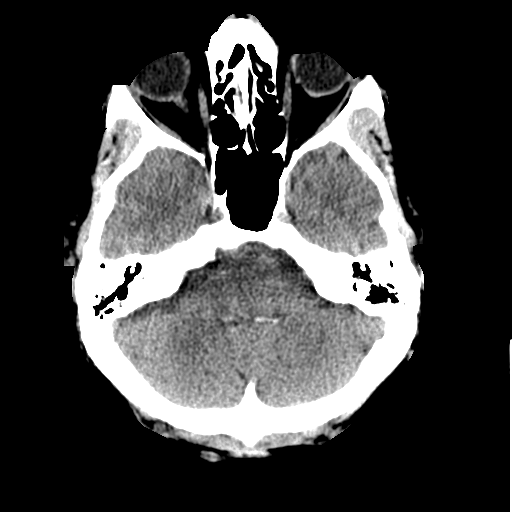
[im 10/31  brain]
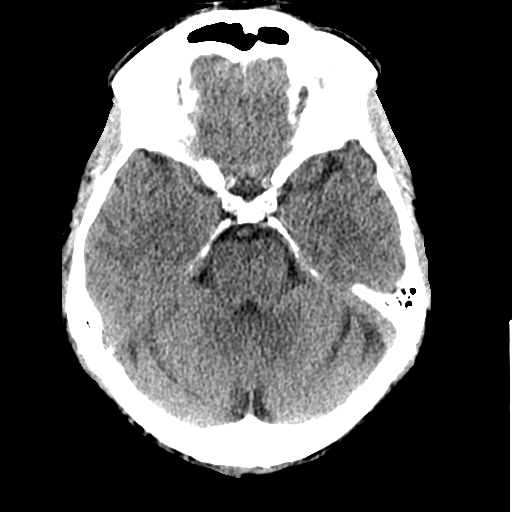
[im 13/31  brain]
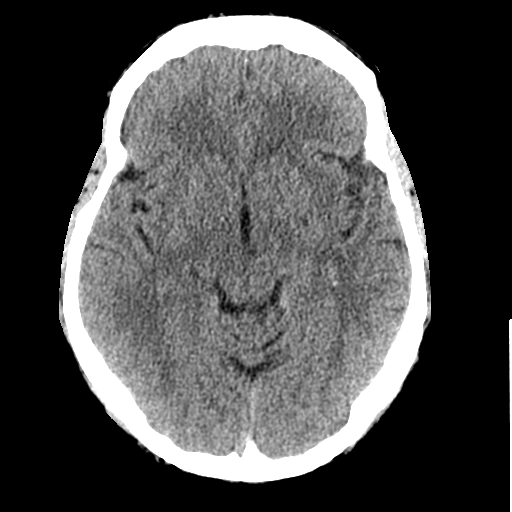
[im 16/31  brain]
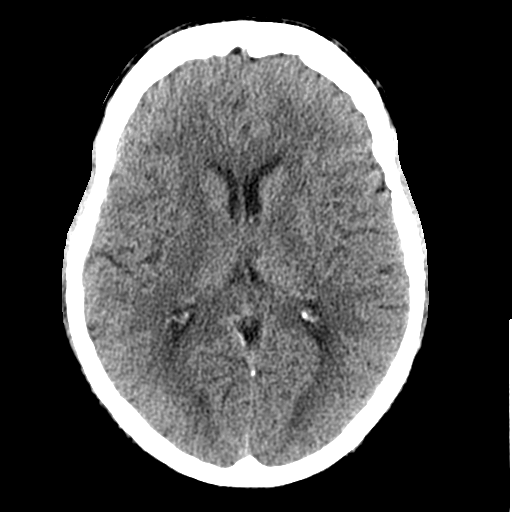
[im 16/31  bone]
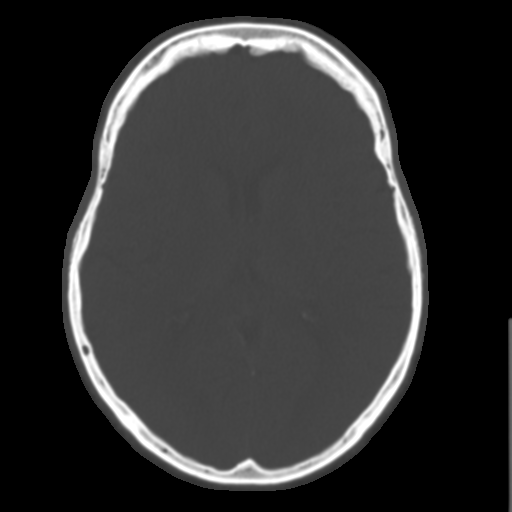
[im 19/31  brain]
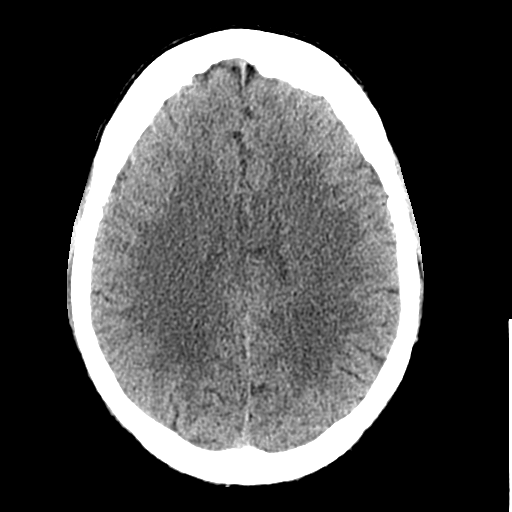
[im 22/31  brain]
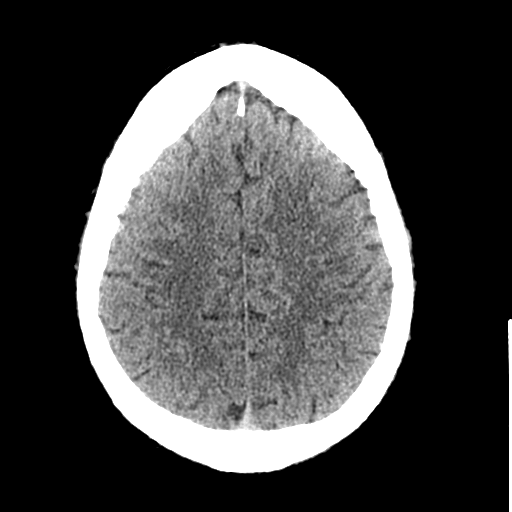
[im 25/31  brain]
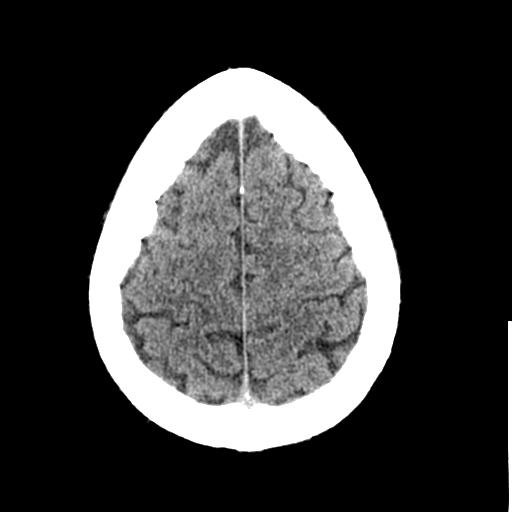
[im 28/31  brain]
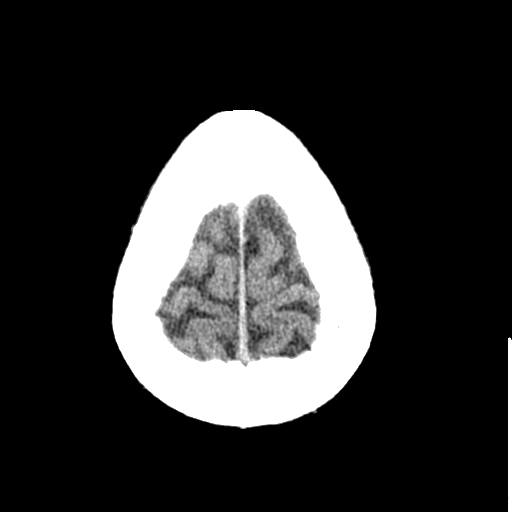
[im 28/31  bone]
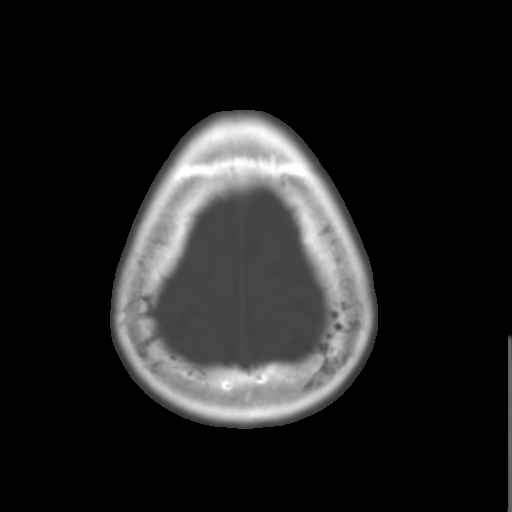

[Series 3: bone windows · axial · 0.43mm/px · z∈[-3,+114]mm · 8 of 51 slices shown]
[im 6/51  bone]
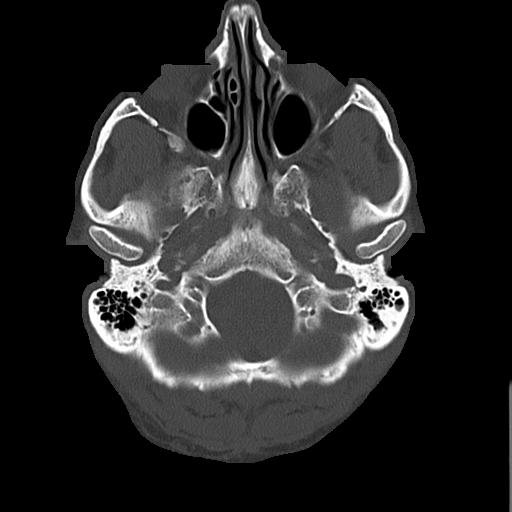
[im 12/51  bone]
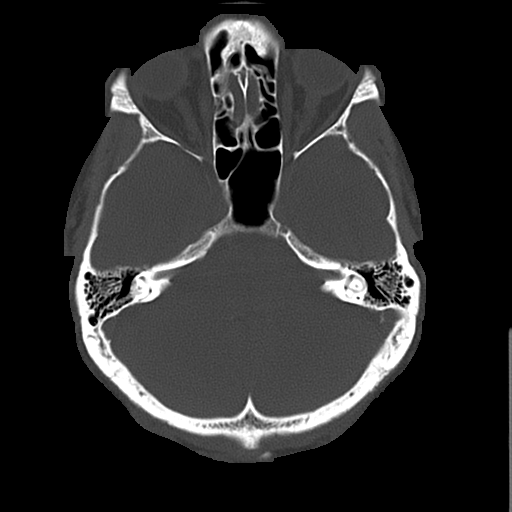
[im 17/51  bone]
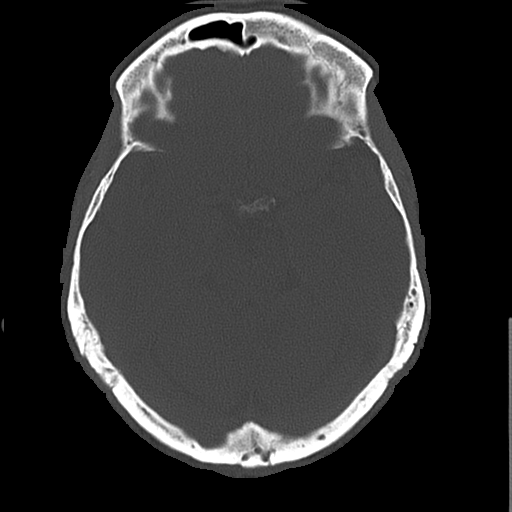
[im 23/51  bone]
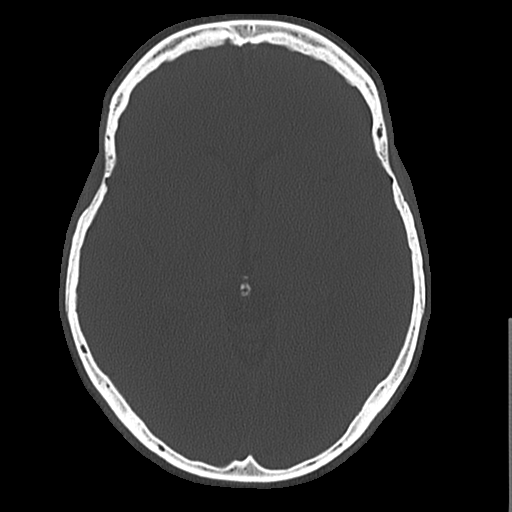
[im 28/51  bone]
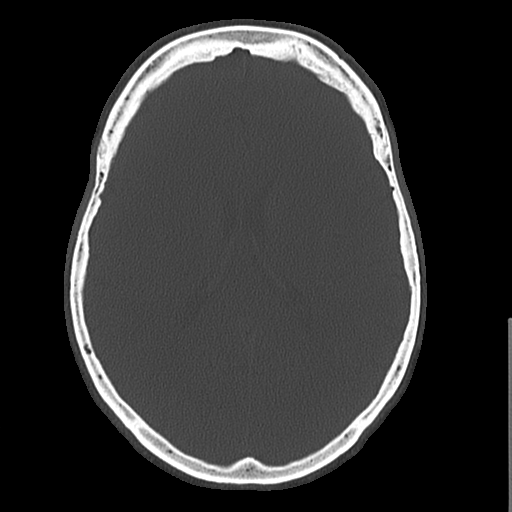
[im 34/51  bone]
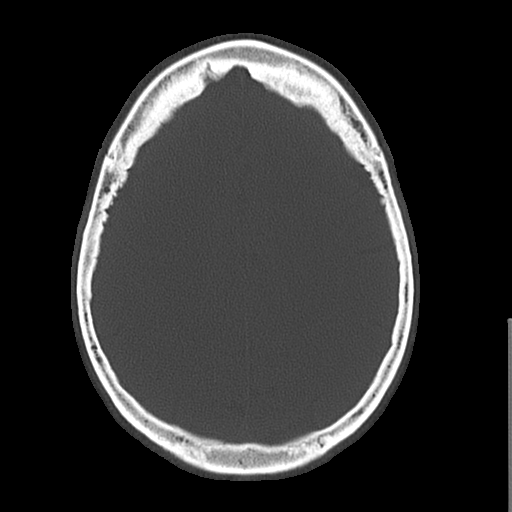
[im 39/51  bone]
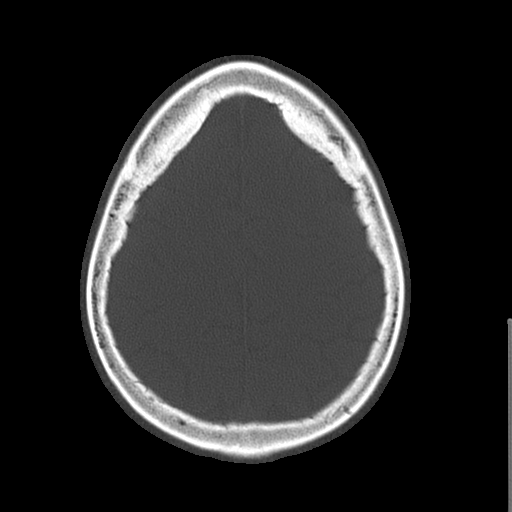
[im 45/51  bone]
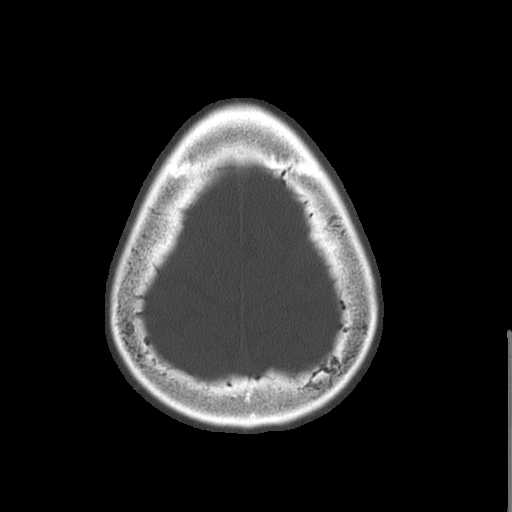

[17 of 30 positions shown; findings below may reference images not displayed]

FINDINGS: No evidence of acute infarct, acute hemorrhage, mass
lesion, mass effect or hydrocephalus.  Mild cerebellar atrophy.
Visualized portions of the paranasal sinuses show trace scattered
mucosal thickening.  No air fluid levels.  Small left mastoid
effusion.
IMPRESSION: 1.  No acute intracranial abnormality.
2.  Mild cerebellar atrophy.
3.  Small left mastoid effusion.

## 2011-05-18 MED ORDER — PROMETHAZINE HCL 25 MG/ML IJ SOLN
25.0000 mg | Freq: Once | INTRAMUSCULAR | Status: AC
  Administered 2011-05-18: 25 mg via INTRAVENOUS
  Filled 2011-05-18: qty 1

## 2011-05-18 MED ORDER — ASPIRIN EC 325 MG PO TBEC
325.0000 mg | DELAYED_RELEASE_TABLET | Freq: Every day | ORAL | Status: DC
  Administered 2011-05-18 – 2011-05-19 (×2): 325 mg via ORAL
  Filled 2011-05-18 (×2): qty 1

## 2011-05-18 MED ORDER — ACETAMINOPHEN 325 MG PO TABS
650.0000 mg | ORAL_TABLET | Freq: Four times a day (QID) | ORAL | Status: DC | PRN
  Administered 2011-05-18: 650 mg via ORAL
  Filled 2011-05-18: qty 2

## 2011-05-18 MED ORDER — ACETAMINOPHEN 325 MG PO TABS
ORAL_TABLET | ORAL | Status: AC
  Filled 2011-05-18: qty 2

## 2011-05-18 MED ORDER — FAMOTIDINE 20 MG PO TABS
20.0000 mg | ORAL_TABLET | Freq: Two times a day (BID) | ORAL | Status: DC
  Administered 2011-05-18 – 2011-05-19 (×3): 20 mg via ORAL
  Filled 2011-05-18 (×4): qty 1

## 2011-05-18 MED ORDER — INSULIN ASPART 100 UNIT/ML ~~LOC~~ SOLN
0.0000 [IU] | Freq: Three times a day (TID) | SUBCUTANEOUS | Status: DC
  Administered 2011-05-18: 3 [IU] via SUBCUTANEOUS
  Filled 2011-05-18: qty 1
  Filled 2011-05-18: qty 3

## 2011-05-18 MED ORDER — INSULIN ASPART 100 UNIT/ML ~~LOC~~ SOLN: 0.0000 [IU] | Freq: Every day | SUBCUTANEOUS | Status: DC

## 2011-05-18 MED ORDER — METFORMIN HCL ER 500 MG PO TB24
500.0000 mg | ORAL_TABLET | Freq: Two times a day (BID) | ORAL | Status: DC
  Administered 2011-05-18 – 2011-05-19 (×3): 500 mg via ORAL
  Filled 2011-05-18 (×4): qty 1

## 2011-05-18 MED ORDER — WARFARIN - PHYSICIAN DOSING INPATIENT
Freq: Every day | Status: DC
  Filled 2011-05-18: qty 1

## 2011-05-18 MED ORDER — PANTOPRAZOLE SODIUM 40 MG IV SOLR
40.0000 mg | Freq: Once | INTRAVENOUS | Status: AC
  Administered 2011-05-18: 40 mg via INTRAVENOUS
  Filled 2011-05-18: qty 40

## 2011-05-18 MED ORDER — WARFARIN SODIUM 5 MG PO TABS
5.0000 mg | ORAL_TABLET | Freq: Every day | ORAL | Status: AC
  Administered 2011-05-18: 5 mg via ORAL
  Filled 2011-05-18: qty 1

## 2011-05-18 MED ORDER — WARFARIN - PHARMACIST DOSING INPATIENT
Freq: Every day | Status: DC
  Filled 2011-05-18 (×2): qty 1

## 2011-05-18 MED ORDER — SODIUM CHLORIDE 0.9 % IV BOLUS (SEPSIS)
500.0000 mL | Freq: Once | INTRAVENOUS | Status: AC
  Administered 2011-05-18: 500 mL via INTRAVENOUS

## 2011-05-18 MED ORDER — ONDANSETRON HCL 4 MG/2ML IJ SOLN
INTRAMUSCULAR | Status: AC
  Administered 2011-05-18: 4 mg
  Filled 2011-05-18: qty 2

## 2011-05-18 MED ORDER — SODIUM CHLORIDE 0.9 % IV SOLN
INTRAVENOUS | Status: DC
  Administered 2011-05-18 – 2011-05-19 (×3): via INTRAVENOUS

## 2011-05-18 MED ORDER — ONDANSETRON HCL 4 MG/2ML IJ SOLN: 4.0000 mg | Freq: Four times a day (QID) | INTRAMUSCULAR | Status: DC | PRN

## 2011-05-18 MED ORDER — INSULIN ASPART 100 UNIT/ML ~~LOC~~ SOLN
0.0000 [IU] | Freq: Three times a day (TID) | SUBCUTANEOUS | Status: DC
  Administered 2011-05-19: 1 [IU] via SUBCUTANEOUS
  Filled 2011-05-18: qty 3

## 2011-05-18 NOTE — H&P (Addendum)
PCP:   Dr. Lynford Humphrey  Chief Complaint:  Syncope  HPI: This is a 71 year old gentleman who today got a sudden onset of dizziness and passed out. It was witnessed there was no seizure activity. He also had episode of nausea vomiting and diarrhea. He was out for a few seconds, he did not hit his head. He was taken to the ER. Prior to this patient with his usual state of health, no fevers, no chills, no nausea, no vomiting.  This is happened to the patient 4 times previously. The first time was in [place], he was hospitalized overnight and treated with IV fluids. The next 2 times was [place] the states, he never came to the ER.    In the ER the patient was found to be orthostatic, with systolic blood pressure in the 70s. He was clammy and diaphoretic. These symptoms have all resolved. The patient states that with each syncopal episode, his symptoms have been the same. He reports no chest pains. Symptoms of tingling usually precede each syncopal events. History provided by the patient was alert and oriented.  Review of Systems: Positives bolded    anorexia, fever, weight loss,, vision loss, decreased hearing, hoarseness, chest pain, syncope, dyspnea on exertion, peripheral edema, balance deficits, hemoptysis, abdominal pain, melena, hematochezia, severe indigestion/heartburn, hematuria, incontinence, genital sores, muscle weakness, suspicious skin lesions, transient blindness, difficulty walking, depression, unusual weight change, abnormal bleeding, enlarged lymph nodes, angioedema, and breast masses.  Past Medical History: Past Medical History  Diagnosis Date  . DVT (deep venous thrombosis)     bilateral legs  . Diabetes mellitus   . Hypertension   . Hyperlipidemia    Past Surgical History  Procedure Date  . Ivc filter placed and removed (right leg)     temporary    Medications: Prior to Admission medications   Medication Sig Start Date End Date Taking? Authorizing Provider    aspirin EC 81 MG tablet Take 81 mg by mouth daily.   Yes Historical Provider, MD  fenofibrate micronized (LOFIBRA) 134 MG capsule Take 134 mg by mouth daily.   Yes Historical Provider, MD  losartan (COZAAR) 50 MG tablet Take 50 mg by mouth daily.   Yes Historical Provider, MD  metFORMIN (GLUCOPHAGE-XR) 500 MG 24 hr tablet Take 500 mg by mouth 2 (two) times daily.   Yes Historical Provider, MD  ranitidine (ZANTAC) 300 MG tablet Take 300 mg by mouth daily.   Yes Historical Provider, MD  tadalafil (CIALIS) 5 MG tablet Take 5 mg by mouth daily as needed.   Yes Historical Provider, MD  warfarin (COUMADIN) 5 MG tablet Take 5 mg by mouth daily.   Yes Historical Provider, MD    Allergies:  No Known Allergies  Social History:  reports that he quit smoking about 20 years ago. He does not have any smokeless tobacco history on file. He reports that he drinks about 1.2 ounces of alcohol per week. He reports that he does not use illicit drugs.  Family History: Family History  Problem Relation Age of Onset  . Alzheimer's disease Mother   . Prostate cancer Father     Physical Exam: Filed Vitals:   05/17/11 2228 05/17/11 2228 05/17/11 2229 05/17/11 2303  BP: 129/58 125/55 78/38 134/61  Pulse: 85 81 89 86  Temp:    99.1 F (37.3 C)  TempSrc:    Oral  Resp: 16  20 17   SpO2: 100% 100%  99%    General:  Alert and  oriented times three, well developed and nourished, no acute distress Eyes: PERRLA, pink conjunctiva, no scleral icterus ENT: Moist oral mucosa, neck supple, no thyromegaly, left carotid bruits Lungs: clear to ascultation, no wheeze, no crackles, no use of accessory muscles Cardiovascular: regular rate and rhythm, no regurgitation, no gallops, no murmurs. no JVD Abdomen: soft, positive BS, non-tender, non-distended, no organomegaly, not an acute abdomen GU: not examined Neuro: CN II - XII grossly intact, sensation intact Musculoskeletal: strength 5/5 all extremities, no clubbing,  cyanosis, left lower extremity and 1+ pitting edema Skin: no rash, no subcutaneous crepitation, no decubitus Psych: appropriate patient   Labs on Admission:   [facility] 05/17/11 2235  NA 137  K 4.2  CL 103  CO2 24  GLUCOSE 183*  BUN 23  CREATININE 1.22  CALCIUM 9.8  MG --  PHOS --    Basename 05/17/11 2235  AST 23  ALT 33  ALKPHOS 40  BILITOT 0.9  PROT 7.2  ALBUMIN 4.2   No results found for this basename: LIPASE:2,AMYLASE:2 in the last 72 hours  Basename 05/17/11 2235  WBC 13.7*  NEUTROABS --  HGB 14.6  HCT 41.3  MCV 88.6  PLT 118*   No results found for this basename: CKTOTAL:3,CKMB:3,CKMBINDEX:3,TROPONINI:3 in the last 72 hours No components found with this basename: POCBNP:3 No results found for this basename: DDIMER:2 in the last 72 hours No results found for this basename: HGBA1C:2 in the last 72 hours No results found for this basename: CHOL:2,HDL:2,LDLCALC:2,TRIG:2,CHOLHDL:2,LDLDIRECT:2 in the last 72 hours No results found for this basename: TSH,T4TOTAL,FREET3,T3FREE,THYROIDAB in the last 72 hours No results found for this basename: VITAMINB12:2,FOLATE:2,FERRITIN:2,TIBC:2,IRON:2,RETICCTPCT:2 in the last 72 hours  Micro Results: No results found for this or any previous visit (from the past 240 hour(s)).   Radiological Exams on Admission: No results found.  EKG normal sinus rhythm  Assessment/Plan Present on Admission:   syncope and collapse  Orthostatic hypotension Admit to observation status  Continue IV fluid hydration, hold by mouth blood pressure medications Rule out infection with cultures and chest x-ray, especially as patient has a leukocytosis. No antibiotics started Patient with left carotid bruit, carotid ultrasound ordered. Aspirin and lipid panel in a.m. Left lower extremity DVT Continue Coumadin pharmacy to dose Diabetes mellitus Hypertension Dyslipidemia Continue home medications, ADA diet insulin scale insulin  Coumadin for  DVT prophylaxis Team 5/Dr. Art Buff, Dezman Granda 05/18/2011, 12:40 AM

## 2011-05-18 NOTE — ED Notes (Signed)
Patient received from the main ed with syncope.  Patient BS clear to ausculation .  S1S2 noted.  SR with 1 degree AV Block with a lateral block also noted.  Placed on CM and 2 L O2 via [place].  IVF's infusing and patient denies pain

## 2011-05-18 NOTE — ED Notes (Signed)
CT calls to see if the patient can go to CT now

## 2011-05-18 NOTE — ED Notes (Signed)
Urine obtained and sent.

## 2011-05-18 NOTE — ED Notes (Signed)
MD at bedside.  Dr. Sunnie Nielsen

## 2011-05-18 NOTE — ED Notes (Signed)
Pt wants to hold of

## 2011-05-18 NOTE — Progress Notes (Signed)
ANTICOAGULATION CONSULT NOTE - Initial Consult  Pharmacy Consult for warfarin Indication: hx of VTE  No Known Allergies  Patient Measurements:   Heparin Dosing Weight:   Vital Signs: Temp: 99.1 F (37.3 C) (02/26 2303) Temp src: Oral (02/26 2303) BP: 134/61 mmHg (02/26 2303) Pulse Rate: 86  (02/26 2303)  Labs:  Basename 05/17/11 2235  HGB 14.6  HCT 41.3  PLT 118*  APTT --  LABPROT 27.4*  INR 2.50*  HEPARINUNFRC --  CREATININE 1.22  CKTOTAL --  CKMB --  TROPONINI --   CrCl is unknown because there is no height on file for the current visit.  Medical History: Past Medical History  Diagnosis Date  . DVT (deep venous thrombosis)     bilateral legs  . Diabetes mellitus   . Hypertension   . Hyperlipidemia     Medications:   (Not in a hospital admission)  Assessment: Patient on chronic warfarin for VTE treatment.  INR at goal.  Goal of Therapy:  INR 2-3   Plan:  Continue with home warfarin dose, daily INR  Darlina Guys, Jacquenette Shone Crowford 05/18/2011,1:15 AM

## 2011-05-18 NOTE — ED Notes (Signed)
Pt report given to [employer], Charity fundraiser

## 2011-05-18 NOTE — ED Notes (Signed)
Pt wants to hold of on the insulin order, pt states that the admit MD did not explain to him the reason why he's is getting insulin.  Pt reports he does not take insulin on a regular basis.

## 2011-05-18 NOTE — ED Notes (Signed)
patient asleep

## 2011-05-18 NOTE — ED Notes (Signed)
Vascular Lab at bedside to do doppler

## 2011-05-18 NOTE — Progress Notes (Signed)
ANTICOAGULATION CONSULT NOTE - Follow Up Consult  Pharmacy Consult for Coumadin Indication: h/o VTE  No Known Allergies  Patient Measurements:    Vital Signs: Temp: 98 F (36.7 C) (02/27 0634) Temp src: Oral (02/27 0634) BP: 114/62 mmHg (02/27 0946) Pulse Rate: 102  (02/27 0946)  Labs:  Basename 05/18/11 0120 05/18/11 0100 05/17/11 2235  HGB 14.2 -- 14.6  HCT 39.6 -- 41.3  PLT 119* -- 118*  APTT -- -- --  LABPROT 28.4* -- 27.4*  INR 2.62* -- 2.50*  HEPARINUNFRC -- -- --  CREATININE 1.17 -- 1.22  CKTOTAL -- -- --  CKMB -- -- --  TROPONINI -- <0.30 --   CrCl is unknown because there is no height on file for the current visit.  Assessment:  70 YOM admitted 2/26 with nausea, emesis and LOC.  Patient was on Coumadin 5 mg po daily PTA for h/o VTE  INR therapeutic on admission so Coumadin 5 mg po daily resumed with daily PT/INR  INR continues to be therapeutic today at 2.62.     H/H stable, plts low at 119.  No bleeding/complications noted  Goal of Therapy:  INR 2-3   Plan:   Continue Coumadin 5 mg po daily and daily PT/INR  If INR continues to be stable, would space INR to 3 times weekly.  Geoffry Paradise Thi 05/18/2011,9:51 AM

## 2011-05-18 NOTE — ED Notes (Signed)
This RN assisted patient to restroom. Patient ambulated without difficulties. Patient requested to use restroom on own. This RN stood outside the door. Patient voided, went to the door and turned the handle and then this RN heard a thud. Opened the door to find patient leaning against wall; patient assisted to floor by this RN and fellow staff members. Patient then lifted to stretcher and placed back in room 25. Shortly after patient was on the stretcher patient became diaphoretic and nauseated. Patient vomited approx. 300 ml yellow/green bile. Dr. Joneen Roach notified and aware; orders received.

## 2011-05-18 NOTE — Progress Notes (Signed)
ED CM spoke with Dr Carmell Austria at 1145 about admission status. See entered order

## 2011-05-18 NOTE — Progress Notes (Signed)
Subjective: Patient relates watery stool started night of admission. He had large watery diarrhea then he pass out at home. He [place] yesterday his BP medications. He has vomited twice.  Objective: Filed Vitals:   05/18/11 1115 05/18/11 1120 05/18/11 1125 05/18/11 1130  BP: 128/65 109/66 118/76 120/67  Pulse: 80 93 92 93  Temp:    100.5 F (38.1 C)  TempSrc:    Oral  Resp: 20   18  Height: 6' (1.829 m)     Weight: 98.884 kg (218 lb)     SpO2: 100%   97%   Weight change:   Intake/Output Summary (Last 24 hours) at 05/18/11 1516 Last data filed at 05/18/11 1254  Gross per 24 hour  Intake    620 ml  Output    100 ml  Net    520 ml    General: Alert, awake, oriented x3, in no acute distress.  HEENT: No bruits, no goiter.  Heart: Regular rate and rhythm, without murmurs, rubs, gallops.  Lungs: CTA, bilateral air movement.  Abdomen: Soft, nontender, nondistended, positive bowel sounds.  Neuro: Grossly intact, nonfocal.   Lab Results:  [facility] 05/18/11 0120 05/17/11 2235  NA 136 137  K 4.0 4.2  CL 104 103  CO2 22 24  GLUCOSE 251* 183*  BUN 25* 23  CREATININE 1.17 1.22  CALCIUM 9.2 9.8  MG -- --  PHOS -- --    Basename 05/17/11 2235  AST 23  ALT 33  ALKPHOS 40  BILITOT 0.9  PROT 7.2  ALBUMIN 4.2    Basename 05/18/11 0120 05/17/11 2235  WBC 9.1 13.7*  NEUTROABS -- --  HGB 14.2 14.6  HCT 39.6 41.3  MCV 88.0 88.6  PLT 119* 118*    Basename 05/18/11 0100  CKTOTAL --  CKMB --  CKMBINDEX --  TROPONINI <0.30    Basename 05/18/11 0120  CHOL 155  HDL 30*  LDLCALC 94  TRIG 147*  CHOLHDL 5.2  LDLDIRECT --    Studies/Results: Ct Head Wo Contrast  05/18/2011  *RADIOLOGY REPORT*  Clinical Data: Syncope and nausea.  CT HEAD WITHOUT CONTRAST  Technique:  Contiguous axial images were obtained from the base of the skull through the vertex without contrast.  Comparison: None.  Findings: No evidence of acute infarct, acute hemorrhage, mass lesion, mass effect or  hydrocephalus.  Mild cerebellar atrophy. Visualized portions of the paranasal sinuses show trace scattered mucosal thickening.  No air fluid levels.  Small left mastoid effusion.  IMPRESSION:  1.  No acute intracranial abnormality. 2.  Mild cerebellar atrophy. 3.  Small left mastoid effusion.  Original Report Authenticated By: Reyes Ivan, M.D.   Dg Chest Port 1 View  05/18/2011  *RADIOLOGY REPORT*  Clinical Data: Shortness of breath and nausea.  PORTABLE CHEST - 1 VIEW  Comparison: 07/19/2006  Findings: Decreased lung volumes with crowding of the central vascular structures.  No focal airspace disease or edema.  Heart size is within normal limits and the trachea is midline.  Negative for a pneumothorax.  IMPRESSION: Low lung volumes without focal disease.  Original Report Authenticated By: Richarda Overlie, M.D.    Medications: I have reviewed the patient's current medications.   Patient Active Hospital Problem List:  Diarrhea, nausea: likely gastroenteritis. I will check stool culture, ova and parasite.   Syncope and collapse (05/18/2011) In the setting of hypotension, orthostatic hypotension. IV fluids. Patient relates history of diarrhea.   DVT of lower extremity (deep venous thrombosis) (05/18/2011) Continue  with coumadin per pharmacy.   Diabetes mellitus (05/18/2011)   SSI. Hypertension (05/18/2011) Hold cozaar setting of hypotension.   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        LOS: 1 day   Weltha Cathy M.D.  [facility] 05/18/2011, 3:16 PM

## 2011-05-18 NOTE — ED Notes (Signed)
Dr. Joneen Roach at bedside with patient.

## 2011-05-18 NOTE — ED Notes (Signed)
Family at bedside. 

## 2011-05-18 NOTE — Progress Notes (Signed)
VASCULAR LAB PRELIMINARY  PRELIMINARY  PRELIMINARY  PRELIMINARY  Carotid duplex completed.    Preliminary report:  Bilateral -  40-59% internal carotid artery stenosis low to mid range. Mild to moderate ECA stenosis. Vertebral arteries are patent and flow is antegrade .  Joshua Peterson D, RVS 05/18/2011, 8:55 AM

## 2011-05-18 NOTE — ED Notes (Signed)
Pt care assumed, obtained verbal report.  Pt is resting comfortably with family at bedside.  Pt denies any complaints at this time.

## 2011-05-18 NOTE — ED Notes (Signed)
Patient voids 100 cc amber colored urine

## 2011-05-18 NOTE — Progress Notes (Signed)
And patient she syncopized in ER in the bathroom. He may have hit his head. CT head ordered for reevaluation. Patient has significant nausea. Patient refuses evaluation as with a CT scan at this point. Importance of repeat CT head has been explained to him including risk of hemorrhages patient is on anticoagulation, patient continues to refuse. He states his nausea, additional for ligament been ordered.

## 2011-05-19 LAB — GLUCOSE, CAPILLARY
Glucose-Capillary: 141 mg/dL — ABNORMAL HIGH (ref 70–99)
Glucose-Capillary: 122 mg/dL — ABNORMAL HIGH (ref 70–99)

## 2011-05-19 LAB — PROTIME-INR: INR: 2.56 — ABNORMAL HIGH (ref 0.00–1.49)

## 2011-05-19 MED ORDER — WARFARIN SODIUM 5 MG PO TABS
5.0000 mg | ORAL_TABLET | Freq: Every day | ORAL | Status: DC
  Filled 2011-05-19: qty 1

## 2011-05-19 NOTE — Discharge Summary (Signed)
Physician Discharge Summary  Patient ID: Joshua Peterson MRN: [id number] DOB/AGE: [birth year] 71 y.o.  Admit date: 05/17/2011 Discharge date: 05/19/2011  Primary Care Physician:  Jeoffrey Massed, MD, MD   Discharge Diagnoses:    Active Problems:  DVT of lower extremity (deep venous thrombosis)  Diabetes mellitus  Hypertension  Hyperlipidemia   Medication List  As of 05/19/2011  2:47 PM   STOP taking these medications         losartan 50 MG tablet      tadalafil 5 MG tablet         TAKE these medications         aspirin EC 81 MG tablet   Take 81 mg by mouth daily.      fenofibrate micronized 134 MG capsule   Commonly known as: LOFIBRA   Take 134 mg by mouth daily.      metFORMIN 500 MG 24 hr tablet   Commonly known as: GLUCOPHAGE-XR   Take 500 mg by mouth 2 (two) times daily.      ranitidine 300 MG tablet   Commonly known as: ZANTAC   Take 300 mg by mouth daily.      warfarin 5 MG tablet   Commonly known as: COUMADIN   Take 5 mg by mouth daily.             Disposition and Follow-up: Pt is medically stable and ready for discharge to home. He will need appointment with PCP in 1 week.   Consults:  None   Physical exam:    Significant Diagnostic Studies:  Ct Head Wo Contrast  05/18/2011  *RADIOLOGY REPORT*  Clinical Data: Syncope and nausea.  CT HEAD WITHOUT CONTRAST  Technique:  Contiguous axial images were obtained from the base of the skull through the vertex without contrast.  Comparison: None.  Findings: No evidence of acute infarct, acute hemorrhage, mass lesion, mass effect or hydrocephalus.  Mild cerebellar atrophy. Visualized portions of the paranasal sinuses show trace scattered mucosal thickening.  No air fluid levels.  Small left mastoid effusion.  IMPRESSION:  1.  No acute intracranial abnormality. 2.  Mild cerebellar atrophy. 3.  Small left mastoid effusion.  Original Report Authenticated By: Reyes Ivan, M.D.   Dg Chest Port 1  View  05/18/2011  *RADIOLOGY REPORT*  Clinical Data: Shortness of breath and nausea.  PORTABLE CHEST - 1 VIEW  Comparison: 07/19/2006  Findings: Decreased lung volumes with crowding of the central vascular structures.  No focal airspace disease or edema.  Heart size is within normal limits and the trachea is midline.  Negative for a pneumothorax.  IMPRESSION: Low lung volumes without focal disease.  Original Report Authenticated By: Richarda Overlie, M.D.    Labs Reviewed  COMPREHENSIVE METABOLIC PANEL - Abnormal; Notable for the following:    Glucose, Bld 183 (*)    GFR calc non Af Amer 58 (*)    GFR calc Af Amer 68 (*)    All other components within normal limits  CBC - Abnormal; Notable for the following:    WBC 13.7 (*)    Platelets 118 (*)    All other components within normal limits  PROTIME-INR - Abnormal; Notable for the following:    Prothrombin Time 27.4 (*)    INR 2.50 (*)    All other components within normal limits  URINALYSIS, ROUTINE W REFLEX MICROSCOPIC - Abnormal; Notable for the following:    Specific Gravity, Urine 1.031 (*)    Glucose, UA >  1000 (*)    All other components within normal limits  GLUCOSE, CAPILLARY - Abnormal; Notable for the following:    Glucose-Capillary 233 (*)    All other components within normal limits  BASIC METABOLIC PANEL - Abnormal; Notable for the following:    Glucose, Bld 251 (*)    BUN 25 (*)    GFR calc non Af Amer 61 (*)    GFR calc Af Amer 71 (*)    All other components within normal limits  CBC - Abnormal; Notable for the following:    Platelets 119 (*) CONSISTENT WITH PREVIOUS RESULT   All other components within normal limits  LIPID PANEL - Abnormal; Notable for the following:    Triglycerides 156 (*)    HDL 30 (*)    All other components within normal limits  PROTIME-INR - Abnormal; Notable for the following:    Prothrombin Time 28.4 (*)    INR 2.62 (*)    All other components within normal limits  GLUCOSE, CAPILLARY -  Abnormal; Notable for the following:    Glucose-Capillary 167 (*)    All other components within normal limits  GLUCOSE, CAPILLARY - Abnormal; Notable for the following:    Glucose-Capillary 132 (*)    All other components within normal limits  PROTIME-INR - Abnormal; Notable for the following:    Prothrombin Time 27.9 (*)    INR 2.56 (*)    All other components within normal limits  GLUCOSE, CAPILLARY - Abnormal; Notable for the following:    Glucose-Capillary 117 (*)    All other components within normal limits  GLUCOSE, CAPILLARY - Abnormal; Notable for the following:    Glucose-Capillary 141 (*)    All other components within normal limits  GLUCOSE, CAPILLARY - Abnormal; Notable for the following:    Glucose-Capillary 122 (*)    All other components within normal limits  GLUCOSE, CAPILLARY - Abnormal; Notable for the following:    Glucose-Capillary 105 (*)    All other components within normal limits  POCT I-STAT TROPONIN I  CULTURE, BLOOD (ROUTINE X 2)  CULTURE, BLOOD (ROUTINE X 2)  TROPONIN I  URINE MICROSCOPIC-ADD ON  STOOL CULTURE  OVA AND PARASITE EXAMINATION        Ct Head Wo Contrast  05/18/2011  *RADIOLOGY REPORT*  Clinical Data: Syncope and nausea.  CT HEAD WITHOUT CONTRAST  Technique:  Contiguous axial images were obtained from the base of the skull through the vertex without contrast.  Comparison: None.  Findings: No evidence of acute infarct, acute hemorrhage, mass lesion, mass effect or hydrocephalus.  Mild cerebellar atrophy. Visualized portions of the paranasal sinuses show trace scattered mucosal thickening.  No air fluid levels.  Small left mastoid effusion.  IMPRESSION:  1.  No acute intracranial abnormality. 2.  Mild cerebellar atrophy. 3.  Small left mastoid effusion.  Original Report Authenticated By: Reyes Ivan, M.D.   Dg Chest Port 1 View  05/18/2011  *RADIOLOGY REPORT*  Clinical Data: Shortness of breath and nausea.  PORTABLE CHEST - 1 VIEW   Comparison: 07/19/2006  Findings: Decreased lung volumes with crowding of the central vascular structures.  No focal airspace disease or edema.  Heart size is within normal limits and the trachea is midline.  Negative for a pneumothorax.  IMPRESSION: Low lung volumes without focal disease.  Original Report Authenticated By: Richarda Overlie, M.D.       Brief H and P: For complete details please refer to admission H and P, but  in brief     Hospital Course:   Active Problems:  DVT of lower extremity (deep venous thrombosis)  Diabetes mellitus  Hypertension  Hyperlipidemia   Time spent on Discharge: 40 min  Signed: Gwenyth Bender 05/19/2011, 2:47 PM

## 2011-05-19 NOTE — Progress Notes (Signed)
ANTICOAGULATION CONSULT NOTE - Follow Up Consult  Pharmacy Consult for Coumadin Indication: h/o VTE  No Known Allergies  Patient Measurements: Height: 6' (182.9 cm) Weight: 218 lb (98.884 kg) IBW/kg (Calculated) : 77.6   Vital Signs: Temp: 98.6 F (37 C) (02/28 0519) Temp src: Oral (02/28 0519) BP: 150/80 mmHg (02/28 0519) Pulse Rate: 68  (02/28 0519)  Labs:  Basename 05/19/11 0445 05/18/11 0120 05/18/11 0100 05/17/11 2235  HGB -- 14.2 -- 14.6  HCT -- 39.6 -- 41.3  PLT -- 119* -- 118*  APTT -- -- -- --  LABPROT 27.9* 28.4* -- 27.4*  INR 2.56* 2.62* -- 2.50*  HEPARINUNFRC -- -- -- --  CREATININE -- 1.17 -- 1.22  CKTOTAL -- -- -- --  CKMB -- -- -- --  TROPONINI -- -- <0.30 --   Estimated Creatinine Clearance: 71.5 ml/min (by C-G formula based on Cr of 1.17).  Assessment:  70 YOM admitted 2/26 with nausea, emesis and LOC.  Patient was on Coumadin 5 mg po daily PTA for h/o VTE  INR therapeutic on home coumadin regimen so will continue Coumadin 5 mg po daily.  H/H stable, plts low at 119 but appear stable.  No bleeding/complications noted  Goal of Therapy:  INR 2-3   Plan:   Continue Coumadin 5 mg po daily and daily PT/INR  If INR continues to be stable, would space INR to 3 times weekly.  Clance Boll 05/19/2011,9:13 AM

## 2011-05-19 NOTE — Discharge Summary (Signed)
Physician Discharge Summary  Patient ID: Joshua Peterson MRN: [id number] DOB/AGE: [birth year] 71 y.o.  Admit date: 05/17/2011 Discharge date: 05/19/2011  Primary Care Physician:  Jeoffrey Massed, MD, MD   Discharge Diagnoses:    Syncope and collapse in setting hypotension.  Gastroenteritis Active Problems:  DVT of lower extremity (deep venous thrombosis)  Diabetes mellitus  Hypertension  Hyperlipidemia   Medication List  As of 05/19/2011  2:59 PM   STOP taking these medications         losartan 50 MG tablet      tadalafil 5 MG tablet         TAKE these medications         aspirin EC 81 MG tablet   Take 81 mg by mouth daily.      fenofibrate micronized 134 MG capsule   Commonly known as: LOFIBRA   Take 134 mg by mouth daily.      metFORMIN 500 MG 24 hr tablet   Commonly known as: GLUCOPHAGE-XR   Take 500 mg by mouth 2 (two) times daily.      ranitidine 300 MG tablet   Commonly known as: ZANTAC   Take 300 mg by mouth daily.      warfarin 5 MG tablet   Commonly known as: COUMADIN   Take 5 mg by mouth daily.             Disposition and Follow-up: Pt medically stable and ready for discharge to home. Will need to follow up PCP 1 week.   Consults:  None   Physical Exam: General: Alert, awake, oriented x3, in no acute distress.  HEENT: No bruits, no goiter.  Heart: Regular rate and rhythm, without murmurs, rubs, gallops.  Lungs: CTA, bilateral air movement.  Abdomen: Soft, nontender, nondistended, positive bowel sounds.  Neuro: Grossly intact, nonfocal.    Significant Diagnostic Studies:  Ct Head Wo Contrast  05/18/2011  *RADIOLOGY REPORT*  Clinical Data: Syncope and nausea.  CT HEAD WITHOUT CONTRAST  Technique:  Contiguous axial images were obtained from the base of the skull through the vertex without contrast.  Comparison: None.  Findings: No evidence of acute infarct, acute hemorrhage, mass lesion, mass effect or hydrocephalus.  Mild cerebellar atrophy.  Visualized portions of the paranasal sinuses show trace scattered mucosal thickening.  No air fluid levels.  Small left mastoid effusion.  IMPRESSION:  1.  No acute intracranial abnormality. 2.  Mild cerebellar atrophy. 3.  Small left mastoid effusion.  Original Report Authenticated By: Reyes Ivan, M.D.   Dg Chest Port 1 View  05/18/2011  *RADIOLOGY REPORT*  Clinical Data: Shortness of breath and nausea.  PORTABLE CHEST - 1 VIEW  Comparison: 07/19/2006  Findings: Decreased lung volumes with crowding of the central vascular structures.  No focal airspace disease or edema.  Heart size is within normal limits and the trachea is midline.  Negative for a pneumothorax.  IMPRESSION: Low lung volumes without focal disease.  Original Report Authenticated By: Richarda Overlie, M.D.    Labs Reviewed  COMPREHENSIVE METABOLIC PANEL - Abnormal; Notable for the following:    Glucose, Bld 183 (*)    GFR calc non Af Amer 58 (*)    GFR calc Af Amer 68 (*)    All other components within normal limits  CBC - Abnormal; Notable for the following:    WBC 13.7 (*)    Platelets 118 (*)    All other components within normal limits  PROTIME-INR - Abnormal; Notable  for the following:    Prothrombin Time 27.4 (*)    INR 2.50 (*)    All other components within normal limits  URINALYSIS, ROUTINE W REFLEX MICROSCOPIC - Abnormal; Notable for the following:    Specific Gravity, Urine 1.031 (*)    Glucose, UA >1000 (*)    All other components within normal limits  GLUCOSE, CAPILLARY - Abnormal; Notable for the following:    Glucose-Capillary 233 (*)    All other components within normal limits  BASIC METABOLIC PANEL - Abnormal; Notable for the following:    Glucose, Bld 251 (*)    BUN 25 (*)    GFR calc non Af Amer 61 (*)    GFR calc Af Amer 71 (*)    All other components within normal limits  CBC - Abnormal; Notable for the following:    Platelets 119 (*) CONSISTENT WITH PREVIOUS RESULT   All other components  within normal limits  LIPID PANEL - Abnormal; Notable for the following:    Triglycerides 156 (*)    HDL 30 (*)    All other components within normal limits  PROTIME-INR - Abnormal; Notable for the following:    Prothrombin Time 28.4 (*)    INR 2.62 (*)    All other components within normal limits  GLUCOSE, CAPILLARY - Abnormal; Notable for the following:    Glucose-Capillary 167 (*)    All other components within normal limits  GLUCOSE, CAPILLARY - Abnormal; Notable for the following:    Glucose-Capillary 132 (*)    All other components within normal limits  PROTIME-INR - Abnormal; Notable for the following:    Prothrombin Time 27.9 (*)    INR 2.56 (*)    All other components within normal limits  GLUCOSE, CAPILLARY - Abnormal; Notable for the following:    Glucose-Capillary 117 (*)    All other components within normal limits  GLUCOSE, CAPILLARY - Abnormal; Notable for the following:    Glucose-Capillary 141 (*)    All other components within normal limits  GLUCOSE, CAPILLARY - Abnormal; Notable for the following:    Glucose-Capillary 122 (*)    All other components within normal limits  GLUCOSE, CAPILLARY - Abnormal; Notable for the following:    Glucose-Capillary 105 (*)    All other components within normal limits  POCT I-STAT TROPONIN I  CULTURE, BLOOD (ROUTINE X 2)  CULTURE, BLOOD (ROUTINE X 2)  TROPONIN I  URINE MICROSCOPIC-ADD ON  STOOL CULTURE  OVA AND PARASITE EXAMINATION        Ct Head Wo Contrast  05/18/2011  *RADIOLOGY REPORT*  Clinical Data: Syncope and nausea.  CT HEAD WITHOUT CONTRAST  Technique:  Contiguous axial images were obtained from the base of the skull through the vertex without contrast.  Comparison: None.  Findings: No evidence of acute infarct, acute hemorrhage, mass lesion, mass effect or hydrocephalus.  Mild cerebellar atrophy. Visualized portions of the paranasal sinuses show trace scattered mucosal thickening.  No air fluid levels.  Small  left mastoid effusion.  IMPRESSION:  1.  No acute intracranial abnormality. 2.  Mild cerebellar atrophy. 3.  Small left mastoid effusion.  Original Report Authenticated By: Reyes Ivan, M.D.   Dg Chest Port 1 View  05/18/2011  *RADIOLOGY REPORT*  Clinical Data: Shortness of breath and nausea.  PORTABLE CHEST - 1 VIEW  Comparison: 07/19/2006  Findings: Decreased lung volumes with crowding of the central vascular structures.  No focal airspace disease or edema.  Heart size is  within normal limits and the trachea is midline.  Negative for a pneumothorax.  IMPRESSION: Low lung volumes without focal disease.  Original Report Authenticated By: Richarda Overlie, M.D.       Brief H and P: For complete details please refer to admission H and P, but in brief  This is a 71 year old gentleman who on 05/18/11 experienced a sudden onset of dizziness and passed out. It was witnessed there was no seizure activity. He also had episode of nausea vomiting and diarrhea prior to this event. He was out for a few seconds, he did not hit his head. He was brought to [facility] ER. Prior to this patient with his usual state of health, no fevers, no chills, no nausea, no vomiting.  This is happened to the patient 4 times previously. The first time was in [place], he was hospitalized overnight and treated with IV fluids. The next 2 times [place], he never came to the ER.  In the ER the patient was found to be orthostatic, with systolic blood pressure in the 70s. He was clammy and diaphoretic. These symptoms have all resolved. The patient states that with each syncopal episode, his symptoms have been the same. He reports no chest pains. Symptoms of tingling usually precede each syncopal events. History provided by the patient was alert and oriented.       Hospital Course:   Active Problems:  DVT of lower extremity (deep venous thrombosis)  Diabetes mellitus  Hypertension  Hyperlipidemia  Diarrhea, nausea:  likely gastroenteritis. Pt. Admitted to tele unit. Supported with IV fluids antiemetics and clear liquid diet. Symptoms quickly resolved. At time of discharge, pt tolerating regular diet and has had only 2 stools albeit loose. stool culture, ova and parasite still pending and will be followed by PCP. Pt has been instructed to not take BP meds until diarrhea resolved completely.   Syncope and collapse (05/18/2011) In the setting of hypotension, orthostatic hypotension. CT head and chest xray as above.  Pt supported with IV fluids. At time of discharge pt not orthostatic. VSS. Tele without sign arrythmia. Suspect related to #1   DVT of lower extremity (deep venous thrombosis) (05/18/2011)  History of same. Continue with coumadin per pharmacy.   Diabetes mellitus (05/18/2011) Remained stable during this hospitalization. At discharge resumed metformin Hypertension (05/18/2011) Holding cozaar setting of hypotension related to #1. At discharge BP stable and patient having few loose stools. Pt has been instructed to hold cozaar until diarrhea completely resolve.  Will follow up with PCP in 1 week. 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 Patient will need repeated carotid doppler in 6 months.      Time spent on Discharge: 35 min  Signed: Gwenyth Bender 05/19/2011, 2:59 PM  Patient seen and examined. Agree with above. Patient stable condition at time discharge.

## 2011-05-19 NOTE — Discharge Instructions (Addendum)
Nausea and Vomiting Nausea is a sick feeling that often comes before throwing up (vomiting). Vomiting is a reflex where stomach contents come out of your mouth. Vomiting can cause severe loss of body fluids (dehydration). Children and elderly adults can become dehydrated quickly, especially if they also have diarrhea. Nausea and vomiting are symptoms of a condition or disease. It is important to find the cause of your symptoms. CAUSES   Direct irritation of the stomach lining. This irritation can result from increased acid production (gastroesophageal reflux disease), infection, food poisoning, taking certain medicines (such as nonsteroidal anti-inflammatory drugs), alcohol use, or tobacco use.   Signals from the brain.These signals could be caused by a headache, heat exposure, an inner ear disturbance, increased pressure in the brain from injury, infection, a tumor, or a concussion, pain, emotional stimulus, or metabolic problems.   An obstruction in the gastrointestinal tract (bowel obstruction).   Illnesses such as diabetes, hepatitis, gallbladder problems, appendicitis, kidney problems, cancer, sepsis, atypical symptoms of a heart attack, or eating disorders.   Medical treatments such as chemotherapy and radiation.   Receiving medicine that makes you sleep (general anesthetic) during surgery.  DIAGNOSIS Your caregiver may ask for tests to be done if the problems do not improve after a few days. Tests may also be done if symptoms are severe or if the reason for the nausea and vomiting is not clear. Tests may include:  Urine tests.   Blood tests.   Stool tests.   Cultures (to look for evidence of infection).   X-rays or other imaging studies.  Test results can help your caregiver make decisions about treatment or the need for additional tests. TREATMENT You need to stay well hydrated. Drink frequently but in small amounts.You may wish to drink water, sports drinks, clear broth, or  eat frozen ice pops or gelatin dessert to help stay hydrated.When you eat, eating slowly may help prevent nausea.There are also some antinausea medicines that may help prevent nausea. HOME CARE INSTRUCTIONS   Take all medicine as directed by your caregiver.   If you do not have an appetite, do not force yourself to eat. However, you must continue to drink fluids.   If you have an appetite, eat a normal diet unless your caregiver tells you differently.   Eat a variety of complex carbohydrates (rice, wheat, potatoes, bread), lean meats, yogurt, fruits, and vegetables.   Avoid high-fat foods because they are more difficult to digest.   Drink enough water and fluids to keep your urine clear or pale yellow.   If you are dehydrated, ask your caregiver for specific rehydration instructions. Signs of dehydration may include:   Severe thirst.   Dry lips and mouth.   Dizziness.   Dark urine.   Decreasing urine frequency and amount.   Confusion.   Rapid breathing or pulse.  SEEK IMMEDIATE MEDICAL CARE IF:   You have blood or brown flecks (like coffee grounds) in your vomit.   You have Arnice Vanepps or bloody stools.   You have a severe headache or stiff neck.   You are confused.   You have severe abdominal pain.   You have chest pain or trouble breathing.   You do not urinate at least once every 8 hours.   You develop cold or clammy skin.   You continue to vomit for longer than 24 to 48 hours.   You have a fever.  MAKE SURE YOU:   Understand these instructions.   Will watch your   condition.   Will get help right away if you are not doing well or get worse.  Document Released: 03/07/2005 Document Revised: 11/17/2010 Document Reviewed: 08/04/2010 [employer] Patient Information 2012 [employer]. 

## 2011-05-22 LAB — STOOL CULTURE

## 2011-05-24 LAB — CULTURE, BLOOD (ROUTINE X 2)
Culture  Setup Time: 201302270357
Culture  Setup Time: 201302270357

## 2012-05-01 ENCOUNTER — Encounter: Payer: Self-pay | Admitting: Gastroenterology

## 2012-05-02 ENCOUNTER — Ambulatory Visit (HOSPITAL_COMMUNITY)
Admission: RE | Admit: 2012-05-02 | Discharge: 2012-05-02 | Disposition: A | Discharge status: Home / Self Care | Payer: 59 | Source: Ambulatory Visit | Attending: Internal Medicine | Admitting: Internal Medicine

## 2012-05-02 ENCOUNTER — Other Ambulatory Visit (HOSPITAL_COMMUNITY): Payer: Self-pay | Admitting: Internal Medicine

## 2012-05-02 DIAGNOSIS — R059 Cough, unspecified: Secondary | ICD-10-CM | POA: Insufficient documentation

## 2012-05-02 DIAGNOSIS — R05 Cough: Secondary | ICD-10-CM | POA: Insufficient documentation

## 2012-05-02 DIAGNOSIS — R042 Hemoptysis: Secondary | ICD-10-CM | POA: Insufficient documentation

## 2012-05-02 DIAGNOSIS — J8410 Pulmonary fibrosis, unspecified: Secondary | ICD-10-CM | POA: Insufficient documentation

## 2012-05-02 IMAGING — CR DG CHEST 2V
3 series · 3 of 3 positions shown · non-contrast
Comparison: Chest x-ray [DATE].

CLINICAL DATA: Cough and hemoptysis.

CHEST - 2 VIEW

[view not recorded (1 of 3)]
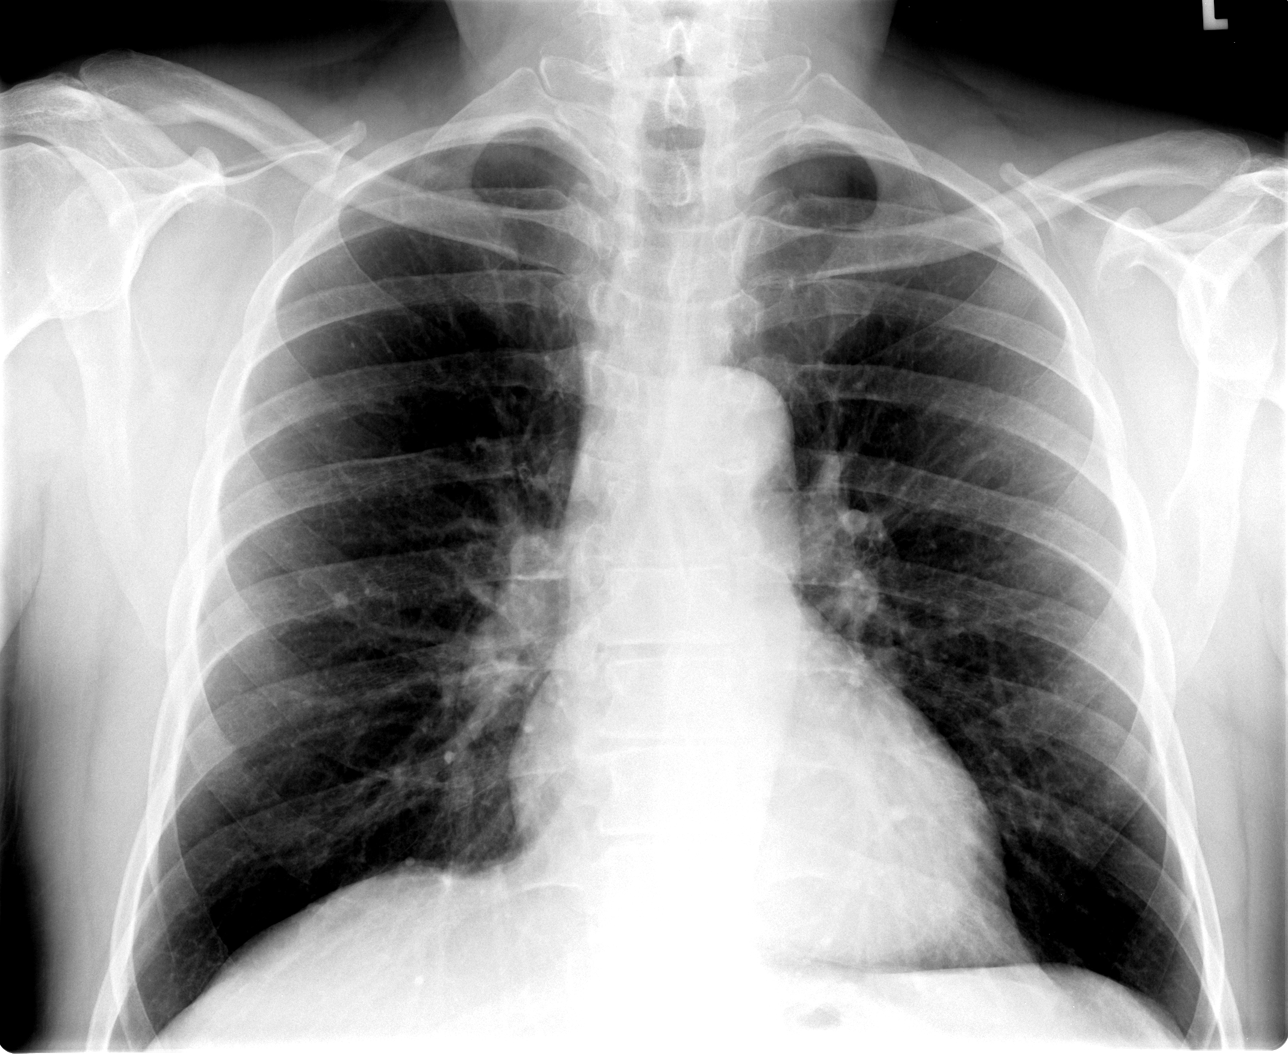

[view not recorded (2 of 3)]
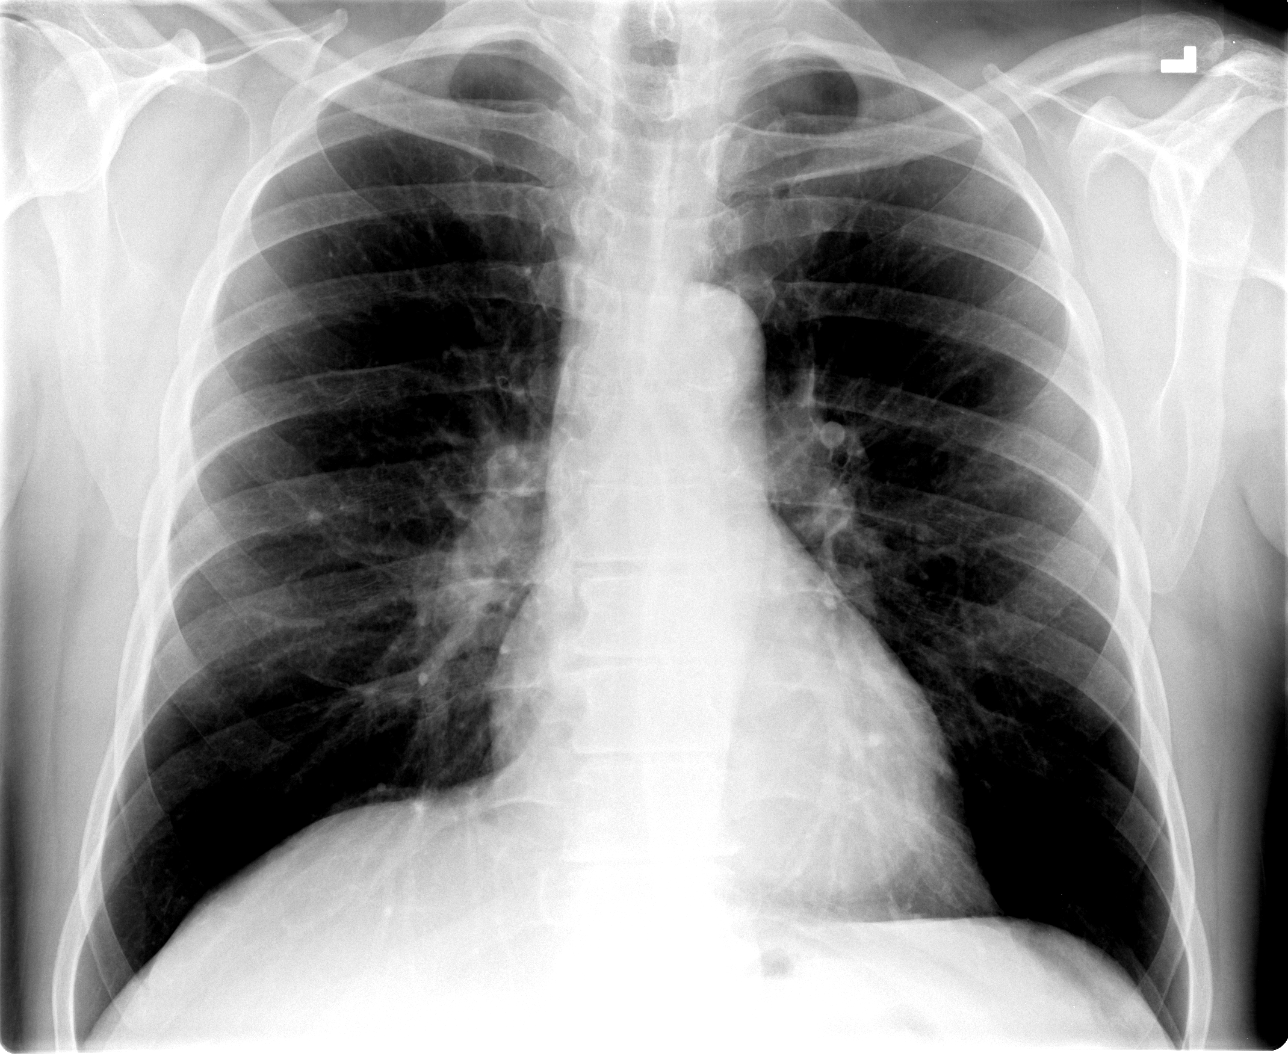

[view not recorded (3 of 3)]
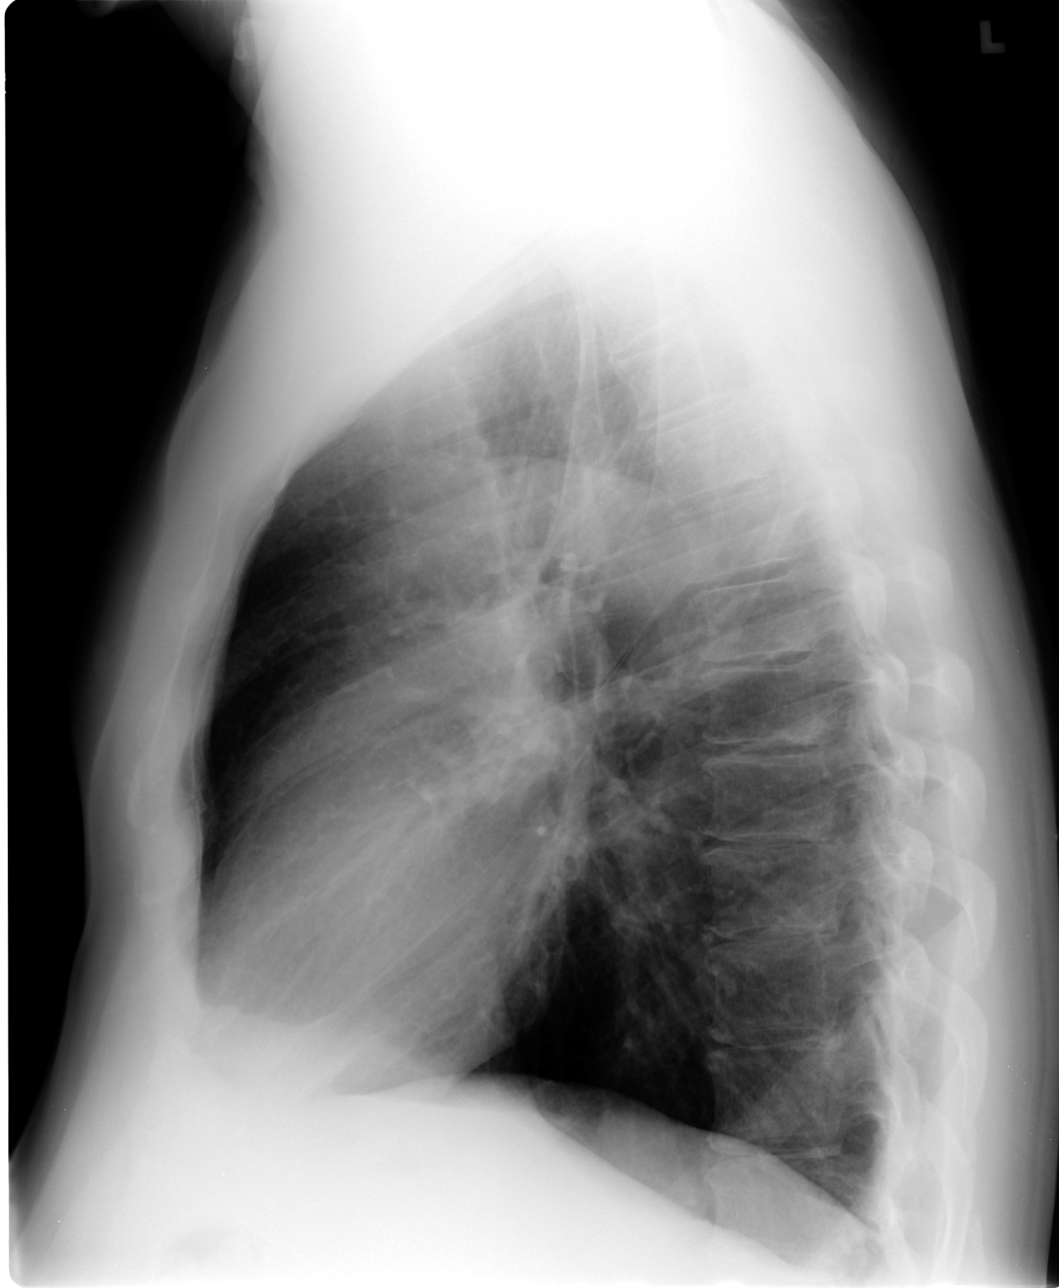

[3 of 3 positions shown; findings below may reference images not displayed]

FINDINGS: Small calcified granuloma in the right lower lobe. Lung
volumes are normal.  No consolidative airspace disease.  No pleural
effusions.  No pneumothorax.  No pulmonary nodule or mass noted.
Pulmonary vasculature and the cardiomediastinal silhouette are
within normal limits.  Atherosclerosis in the thoracic aorta.
IMPRESSION: 1. No radiographic evidence of acute cardiopulmonary disease.
2.  Atherosclerosis.
3.  Right lower lobe calcified granuloma.

## 2012-05-09 ENCOUNTER — Encounter: Payer: Self-pay | Admitting: Gastroenterology

## 2012-06-29 ENCOUNTER — Ambulatory Visit (AMBULATORY_SURGERY_CENTER): Payer: 59 | Admitting: *Deleted

## 2012-06-29 ENCOUNTER — Encounter: Payer: Self-pay | Admitting: Gastroenterology

## 2012-06-29 VITALS — Ht 71.0 in | Wt 228.0 lb

## 2012-06-29 DIAGNOSIS — Z86010 Personal history of colonic polyps: Secondary | ICD-10-CM

## 2012-06-29 DIAGNOSIS — Z1211 Encounter for screening for malignant neoplasm of colon: Secondary | ICD-10-CM

## 2012-06-29 MED ORDER — NA SULFATE-K SULFATE-MG SULF 17.5-3.13-1.6 GM/177ML PO SOLN: 1.0000 | Freq: Once | ORAL | Status: DC

## 2012-06-29 NOTE — Progress Notes (Signed)
NO EGG OR SOY ALLERGY. EWM NO PROBLEMS WITH SEDATION IN THE PAST. EWM

## 2012-07-12 ENCOUNTER — Encounter: Payer: Self-pay | Admitting: Gastroenterology

## 2012-07-12 ENCOUNTER — Ambulatory Visit (AMBULATORY_SURGERY_CENTER): Payer: 59 | Admitting: Gastroenterology

## 2012-07-12 ENCOUNTER — Other Ambulatory Visit: Payer: Self-pay | Admitting: Gastroenterology

## 2012-07-12 VITALS — BP 131/98 | HR 59 | Temp 97.6°F | Resp 16 | Ht 71.0 in | Wt 228.0 lb

## 2012-07-12 DIAGNOSIS — D126 Benign neoplasm of colon, unspecified: Secondary | ICD-10-CM

## 2012-07-12 DIAGNOSIS — Z1211 Encounter for screening for malignant neoplasm of colon: Secondary | ICD-10-CM

## 2012-07-12 DIAGNOSIS — Z86010 Personal history of colonic polyps: Secondary | ICD-10-CM

## 2012-07-12 LAB — GLUCOSE, CAPILLARY: Glucose-Capillary: 142 mg/dL — ABNORMAL HIGH (ref 70–99)

## 2012-07-12 MED ORDER — SODIUM CHLORIDE 0.9 % IV SOLN: 500.0000 mL | INTRAVENOUS | Status: DC

## 2012-07-12 NOTE — Patient Instructions (Addendum)

## 2012-07-12 NOTE — Op Note (Signed)
[facility] [address] [place], [address]   COLONOSCOPY PROCEDURE REPORT  PATIENT: Joshua Peterson, Joshua Peterson  MR#: [id number] BIRTHDATE: January 28, 1941 , 71  yrs. old GENDER: Male ENDOSCOPIST: Louis Meckel, MD REFERRED [id number] Oneta Rack, M.D. PROCEDURE DATE:  07/12/2012 PROCEDURE:   Colonoscopy with snare polypectomy and Colonoscopy with cold biopsy polypectomy ASA CLASS:   Class II INDICATIONS:Patient's personal history of colon polyps and adenomatous polyps 2006, 2009. MEDICATIONS: MAC sedation, administered by CRNA and propofol (Diprivan) 200mg  IV  DESCRIPTION OF PROCEDURE:   After the risks benefits and alternatives of the procedure were thoroughly explained, informed consent was obtained.  A digital rectal exam revealed no abnormalities of the rectum.   The LB [id number]  endoscope was introduced through the anus and advanced to the cecum, which was identified by both the appendix and ileocecal valve. No adverse events experienced.   The quality of the prep was Suprep excellent The instrument was then slowly withdrawn as the colon was fully examined.      COLON FINDINGS: A sessile polyp measuring 2 mm in size was found in the proximal descending colon.  A polypectomy was performed with cold forceps.   A sessile polyp measuring 3 mm in size was found in the distal transverse colon.  A polypectomy was performed with a cold snare.  The resection was complete and the polyp tissue was completely retrieved.   A sessile polyp measuring 3 mm in size was found.  A polypectomy was performed.  The resection was complete and the polyp tissue was not retrieved.   The colon mucosa was otherwise normal.  Retroflexed views revealed no abnormalities. The time to cecum=2 minutes 39 seconds.  Withdrawal time=11 minutes 20 seconds.  The scope was withdrawn and the procedure completed. COMPLICATIONS: There were no complications.  ENDOSCOPIC IMPRESSION: 1.   Sessile  polyp measuring 2 mm in size was found in the proximal descending colon; polypectomy was performed with cold forceps 2.   Sessile polyp measuring 3 mm in size was found in the distal transverse colon; polypectomy was performed with a cold snare 3.   Sessile polyp measuring 3 mm in size was found; polypectomy was performed 4.   The colon mucosa was otherwise normal  RECOMMENDATIONS: If the polyp(s) removed today are proven to be adenomatous (pre-cancerous) polyps, you will need a repeat colonoscopy in 5 years.  Otherwise you should continue to follow colorectal cancer screening guidelines for "routine risk" patients with colonoscopy in 10 years.  You will receive a letter within 1-2 weeks with the results of your biopsy as well as final recommendations.  Please call my office if you have not received a letter after 3 weeks.   eSigned:  Louis Meckel, MD 07/12/2012 9:11 AM   cc:   PATIENT NAME:  [name], [name] MR#: [id number]

## 2012-07-12 NOTE — Progress Notes (Signed)
Patient did not experience any of the following events: a burn prior to discharge; a fall within the facility; wrong site/side/patient/procedure/implant event; or a hospital transfer or hospital admission upon discharge from the facility. (G8907) Patient did not have preoperative order for IV antibiotic SSI prophylaxis. (G8918)  

## 2012-07-12 NOTE — Progress Notes (Signed)
Called to room to assist during endoscopic procedure.  Patient ID and intended procedure confirmed with present staff. Received instructions for my participation in the procedure from the performing physician.  

## 2012-07-13 ENCOUNTER — Telehealth: Payer: Self-pay | Admitting: *Deleted

## 2012-07-13 NOTE — Telephone Encounter (Signed)
No identifier, left message, follow-up  

## 2012-07-19 ENCOUNTER — Encounter: Payer: Self-pay | Admitting: Gastroenterology

## 2012-07-31 LAB — HM COLONOSCOPY

## 2012-08-23 ENCOUNTER — Other Ambulatory Visit (HOSPITAL_COMMUNITY): Payer: Self-pay | Admitting: Internal Medicine

## 2012-08-23 ENCOUNTER — Ambulatory Visit (HOSPITAL_COMMUNITY)
Admission: RE | Admit: 2012-08-23 | Discharge: 2012-08-23 | Disposition: A | Discharge status: Home / Self Care | Payer: 59 | Source: Ambulatory Visit | Attending: Internal Medicine | Admitting: Internal Medicine

## 2012-08-23 DIAGNOSIS — R0789 Other chest pain: Secondary | ICD-10-CM | POA: Insufficient documentation

## 2012-08-23 DIAGNOSIS — R072 Precordial pain: Secondary | ICD-10-CM

## 2012-08-23 DIAGNOSIS — I10 Essential (primary) hypertension: Secondary | ICD-10-CM | POA: Insufficient documentation

## 2012-08-23 DIAGNOSIS — Z87891 Personal history of nicotine dependence: Secondary | ICD-10-CM | POA: Insufficient documentation

## 2012-08-23 IMAGING — CR DG CHEST 2V
2 series · 2 of 2 positions shown · non-contrast
Comparison: [DATE]

CLINICAL DATA: Chest pain.

CHEST - 2 VIEW

[view not recorded (1 of 2)]
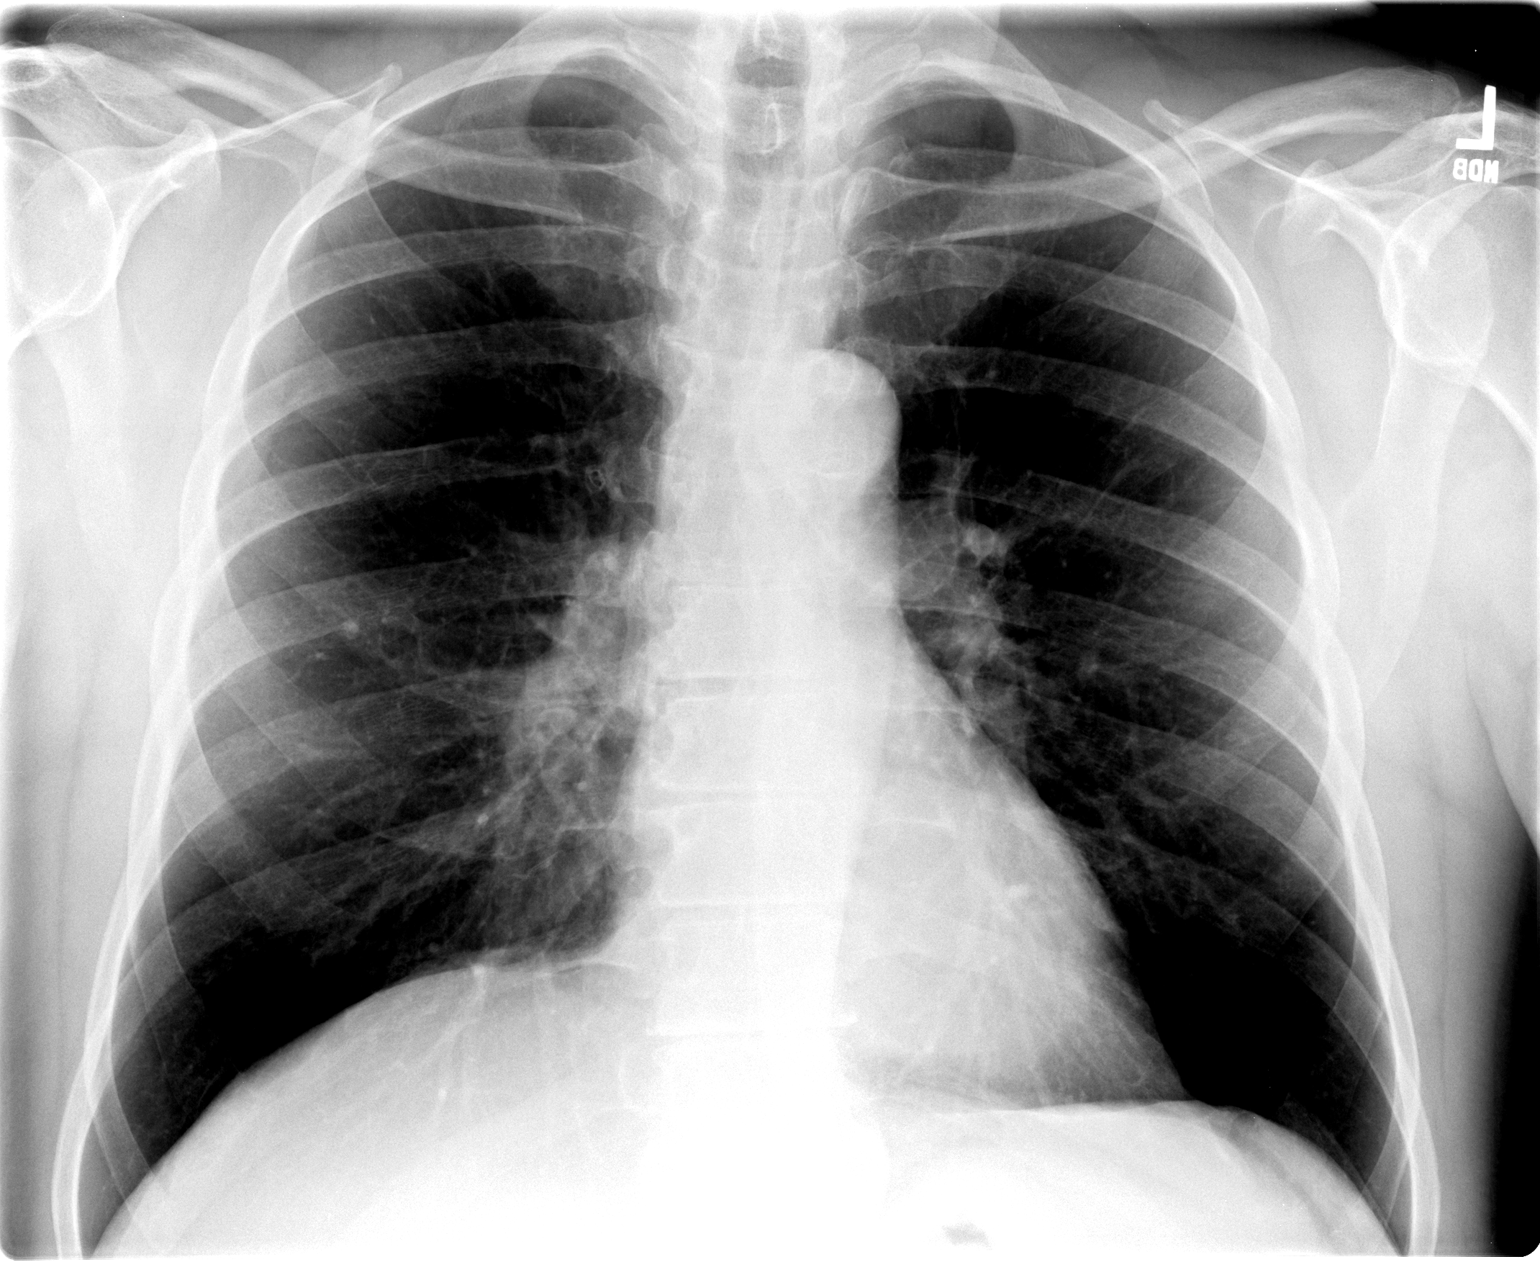

[view not recorded (2 of 2)]
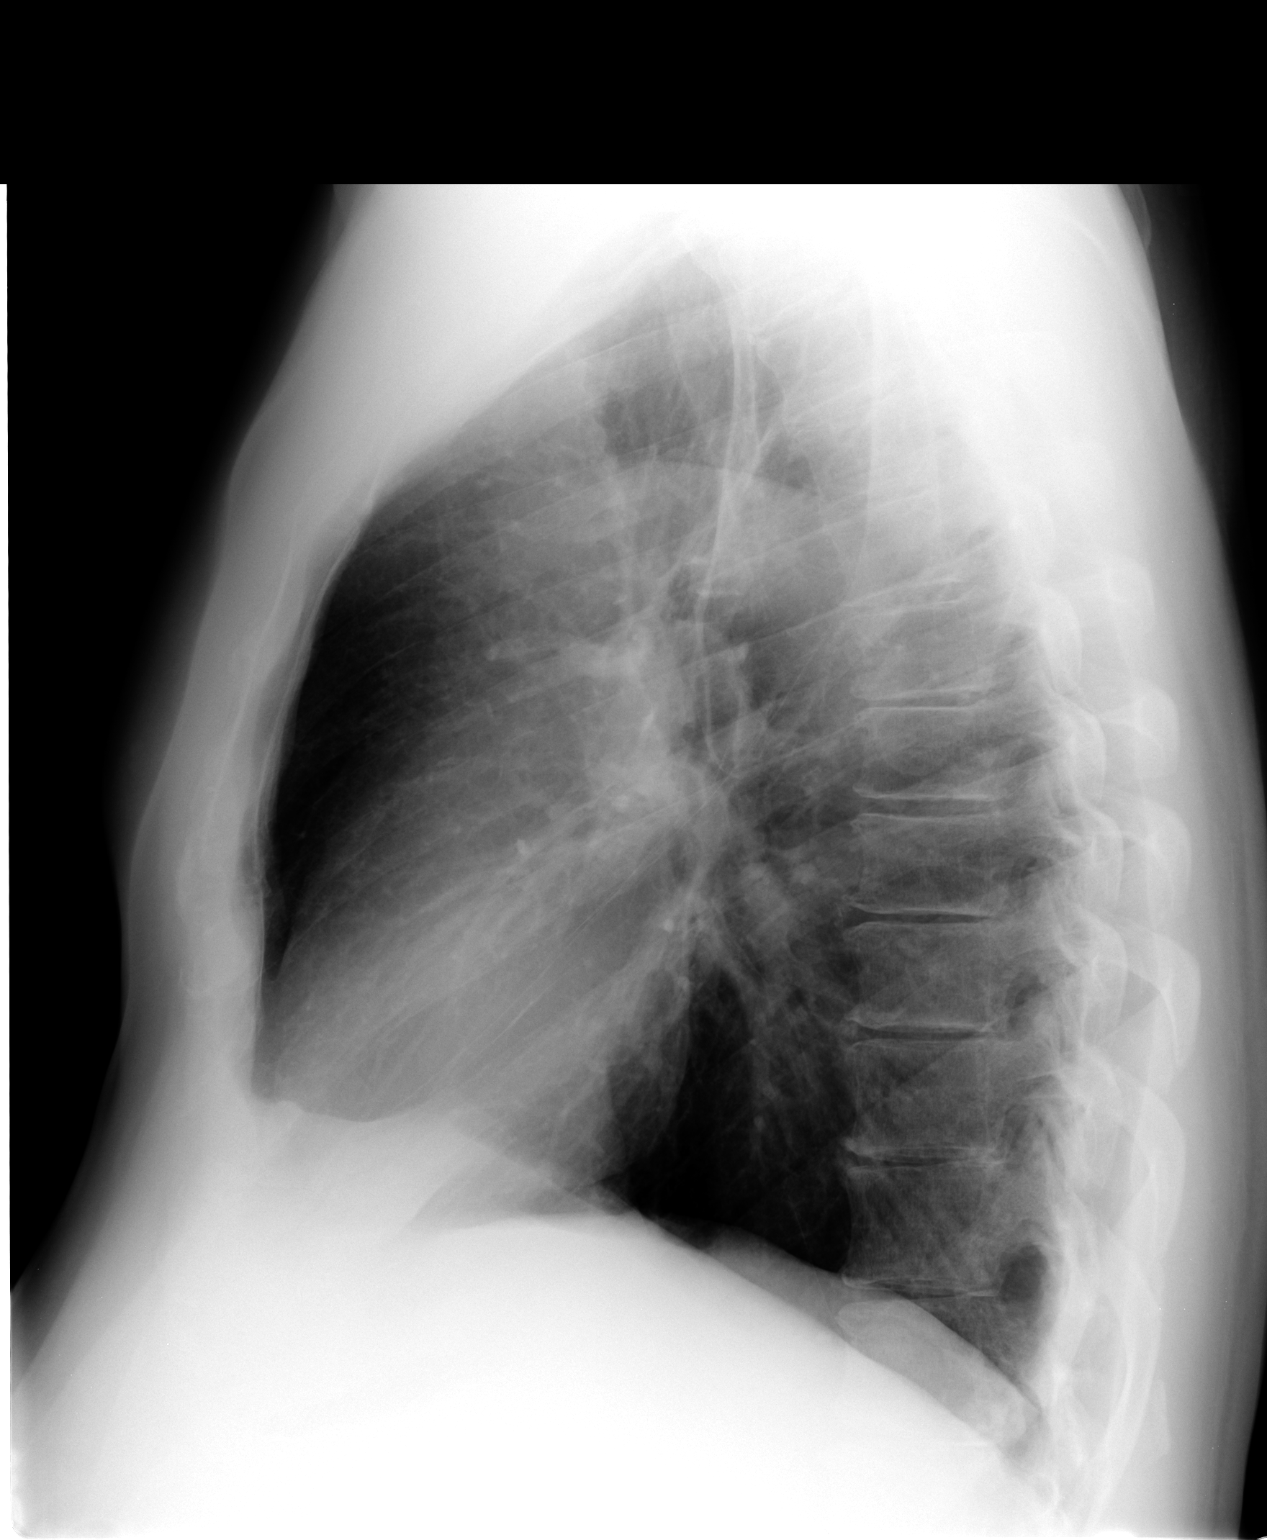

[2 of 2 positions shown; findings below may reference images not displayed]

FINDINGS: Heart and mediastinal contours are within normal limits.
No focal opacities or effusions.  No acute bony abnormality.
IMPRESSION: No active cardiopulmonary disease.

## 2013-01-31 ENCOUNTER — Encounter: Payer: Self-pay | Admitting: Physician Assistant

## 2013-01-31 DIAGNOSIS — E349 Endocrine disorder, unspecified: Secondary | ICD-10-CM | POA: Insufficient documentation

## 2013-01-31 DIAGNOSIS — Z6827 Body mass index (BMI) 27.0-27.9, adult: Secondary | ICD-10-CM | POA: Insufficient documentation

## 2013-01-31 DIAGNOSIS — E559 Vitamin D deficiency, unspecified: Secondary | ICD-10-CM

## 2013-01-31 DIAGNOSIS — E291 Testicular hypofunction: Secondary | ICD-10-CM

## 2013-01-31 DIAGNOSIS — E669 Obesity, unspecified: Secondary | ICD-10-CM

## 2013-02-04 ENCOUNTER — Ambulatory Visit: Payer: 59 | Admitting: Physician Assistant

## 2013-02-04 ENCOUNTER — Encounter: Payer: Self-pay | Admitting: Physician Assistant

## 2013-02-04 VITALS — BP 130/82 | HR 76 | Temp 98.2°F | Resp 16 | Ht 71.5 in | Wt 219.0 lb

## 2013-02-04 DIAGNOSIS — I10 Essential (primary) hypertension: Secondary | ICD-10-CM

## 2013-02-04 DIAGNOSIS — E785 Hyperlipidemia, unspecified: Secondary | ICD-10-CM

## 2013-02-04 DIAGNOSIS — E559 Vitamin D deficiency, unspecified: Secondary | ICD-10-CM

## 2013-02-04 DIAGNOSIS — E119 Type 2 diabetes mellitus without complications: Secondary | ICD-10-CM

## 2013-02-04 LAB — HEPATIC FUNCTION PANEL
Alkaline Phosphatase: 58 U/L (ref 39–117)
Indirect Bilirubin: 0.6 mg/dL (ref 0.0–0.9)
Total Bilirubin: 0.8 mg/dL (ref 0.3–1.2)

## 2013-02-04 LAB — LIPID PANEL
Cholesterol: 155 mg/dL (ref 0–200)
HDL: 29 mg/dL — ABNORMAL LOW (ref >39)
LDL Cholesterol: 79 mg/dL (ref 0–99)
Total CHOL/HDL Ratio: 5.3 Ratio
Triglycerides: 236 mg/dL — ABNORMAL HIGH (ref <150)
VLDL: 47 mg/dL — ABNORMAL HIGH (ref 0–40)

## 2013-02-04 LAB — BASIC METABOLIC PANEL WITH GFR
BUN: 21 mg/dL (ref 6–23)
CO2: 26 mEq/L (ref 19–32)
Calcium: 9.8 mg/dL (ref 8.4–10.5)
Chloride: 107 mEq/L (ref 96–112)
Creat: 1.11 mg/dL (ref 0.50–1.35)
GFR, Est African American: 76 mL/min
GFR, Est Non African American: 66 mL/min
Glucose, Bld: 154 mg/dL — ABNORMAL HIGH (ref 70–99)
Potassium: 4.1 mEq/L (ref 3.5–5.3)
Sodium: 142 mEq/L (ref 135–145)

## 2013-02-04 LAB — CBC WITH DIFFERENTIAL/PLATELET
Basophils Absolute: 0.1 10*3/uL (ref 0.0–0.1)
HCT: 39.4 % (ref 39.0–52.0)
Lymphocytes Relative: 30 % (ref 12–46)
Lymphs Abs: 1.9 10*3/uL (ref 0.7–4.0)
Monocytes Absolute: 0.4 10*3/uL (ref 0.1–1.0)
Neutro Abs: 3.8 10*3/uL (ref 1.7–7.7)
Platelets: 150 10*3/uL (ref 150–400)
RBC: 4.43 MIL/uL (ref 4.22–5.81)
RDW: 13.4 % (ref 11.5–15.5)
WBC: 6.3 10*3/uL (ref 4.0–10.5)

## 2013-02-04 LAB — HEMOGLOBIN A1C: Mean Plasma Glucose: 163 mg/dL — ABNORMAL HIGH (ref <117)

## 2013-02-04 MED ORDER — METFORMIN HCL ER 500 MG PO TB24: ORAL_TABLET | ORAL | Status: DC

## 2013-02-04 NOTE — Patient Instructions (Addendum)
We are starting you on Metformin to prevent or treat diabetes. Metformin does not cause low blood sugars. In order to create energy your cells need insulin and sugar but sometime your cells do not accept the insulin and this can cause increased sugars and decreased energy. The Metformin helps your cells accept insulin and the sugar to give you more energy.   The two most common side effects are nausea and diarrhea, follow these rules to avoid it! You can take imodium per box instructions when starting metformin if needed.   Rules of metformin: 1) start out slow with only one pill daily. Our goal for you is 4 pills a day or 2000mg total.  2) take with your largest meal. 3) Take with least amount of carbs.   Call if you have any problems.   We want weight loss that will last so you should lose 1-2 pounds a week.  THAT IS IT! Please pick THREE things a month to change. Once it is a habit check off the item. Then pick another three items off the list to become habits.  If you are already doing a habit on the list GREAT!  Cross that item off! o Don't drink your calories. Ie, alcohol, soda, fruit juice, and sweet tea.  o Drink more water. Drink a glass when you feel hungry or before each meal.  o Eat breakfast - Complex carb and protein (likeDannon light and fit yogurt, oatmeal, fruit, eggs, turkey bacon). o Measure your cereal.  Eat no more than one cup a day. (ie Kashi) o Eat an apple a day. o Add a vegetable a day. o Try a new vegetable a month. o Use Pam! Stop using oil or butter to cook. o Don't finish your plate or use smaller plates. o Share your dessert. o Eat sugar free Jello for dessert or frozen grapes. o Don't eat 2-3 hours before bed. o Switch to whole wheat bread, pasta, and brown rice. o Make healthier choices when you eat out. No fries! o Pick baked chicken, NOT fried. o Don't forget to SLOW DOWN when you eat. It is not going anywhere.  o Take the stairs. o Park far away in the  parking lot o Lift soup cans (or weights) for 10 minutes while watching TV. o Walk at work for 10 minutes during break. o Walk outside 1 time a week with your friend, kids, dog, or significant other. o Start a walking group at church. o Walk the mall as much as you can tolerate.  o Keep a food diary. o Weigh yourself daily. o Walk for 15 minutes 3 days per week. o Cook at home more often and eat out less.  If life happens and you go back to old habits, it is okay.  Just start over. You can do it!   If you experience chest pain, get short of breath, or tired during the exercise, please stop immediately and inform your doctor.   

## 2013-02-04 NOTE — Progress Notes (Signed)
HPI Patient presents for 3 month follow up with hypertension, hyperlipidemia, prediabetes and vitamin D. Patient's blood pressure has been controlled at home. Patient denies chest pain, shortness of breath, dizziness.  Patient's cholesterol is diet controlled. In addition they are on fenofibrate 134 and denies myalgias. The cholesterol last visit was LDL 82 . The patient has been working on diet and exercise for prediabetes, and denies changes in vision, polys, but states he does have paresthesias occasionally. A1C 6.9. Patient is on Vitamin D supplement.  Current Medications:  Current Outpatient Prescriptions on File Prior to Visit  Medication Sig Dispense Refill  . aspirin EC 81 MG tablet Take 81 mg by mouth 2 (two) times daily.       . Ergocalciferol (VITAMIN D2 PO) Take 1 capsule by mouth daily. 10000UNITS      . fenofibrate micronized (LOFIBRA) 134 MG capsule 134 mg daily.       . hydrochlorothiazide (HYDRODIURIL) 25 MG tablet       . losartan (COZAAR) 50 MG tablet Take 50 mg by mouth daily.      . metFORMIN (GLUCOPHAGE-XR) 500 MG 24 hr tablet Take 500 mg by mouth 2 (two) times daily.      . Multiple Vitamins-Minerals (MULTIVITAMIN PO) Take 1 capsule by mouth daily.      . Omega-3 Fatty Acids (OMEGA 3 PO) Take 1 capsule by mouth daily.      . ranitidine (ZANTAC) 300 MG tablet Take 300 mg by mouth daily.       No current facility-administered medications on file prior to visit.   Medical History:  Past Medical History  Diagnosis Date  . DVT (deep venous thrombosis)     bilateral legs  . Diabetes mellitus   . Hypertension   . Hyperlipidemia   . Hx of colonic polyps   . Vitamin D deficiency   . Hypogonadism male   . Obesity    Allergies:  Allergies  Allergen Reactions  . Doxycycline   . Niacin And Related     flushing  . Sulfa Antibiotics Other (See Comments)    ROS Constitutional: Denies fever, chills, weight loss/gain, headaches, insomnia, fatigue, night sweats, and  change in appetite. Eyes: Denies redness, blurred vision, diplopia, discharge, itchy, watery eyes.  ENT: Denies discharge, congestion, post nasal drip, sore throat, earache, dental pain, Tinnitus, Vertigo, Sinus pain, snoring.  Cardio: Denies chest pain, palpitations, irregular heartbeat,  dyspnea, diaphoresis, orthopnea, PND, claudication, edema Respiratory: denies cough, dyspnea,pleurisy, hoarseness, wheezing.  Gastrointestinal: Denies dysphagia, heartburn,  water brash, pain, cramps, nausea, vomiting, bloating, diarrhea, constipation, hematemesis, melena, hematochezia,  hemorrhoids Genitourinary: Denies dysuria, frequency, urgency, nocturia, hesitancy, discharge, hematuria, flank pain Musculoskeletal: Denies arthralgia, myalgia, stiffness, Jt. Swelling, pain, limp, and strain/sprain. Skin: Denies pruritis, rash, hives, warts, acne, eczema, changing in skin lesion Neuro: Weakness, tremor, incoordination, spasms, pain Psychiatric: Denies confusion, memory loss, sensory loss Endocrine:+ paresthesia,Denies change in weight, skin, hair change, nocturia, and  Diabetic Polys, visual blurring, hyper /hypo glycemic episodes.  Heme/Lymph: Excessive bleeding, bruising, enlarged lymph nodes  Family history- Review and unchanged Social history- Review and unchanged Physical Exam: Filed Vitals:   02/04/13 1202  BP: 130/82  Pulse: 76  Temp: 98.2 F (36.8 C)  Resp: 16   Filed Weights   02/04/13 1202  Weight: 219 lb (99.338 kg)   General Appearance: Well nourished, in no apparent distress. Eyes: PERRLA, EOMs, conjunctiva no swelling or erythema, normal fundi and vessels. Sinuses: No Frontal/maxillary tenderness ENT/Mouth: Ext aud canals clear,  with TMs without erythema, bulging.No erythema, swelling, or exudate on post pharynx.  Tonsils not swollen or erythematous. Hearing normal.  Neck: Supple, thyroid normal.  Respiratory: Respiratory effort normal, BS equal bilaterally without rales, rhonchi,  wheezing or stridor.  Cardio: Heart sounds normal, regular rate and rhythm without murmurs, rubs or gallops. Peripheral pulses brisk and equal bilaterally, without edema.  Abdomen: Flat, soft, with bowel sounds. Non tender, no guarding, rebound, hernias, masses, or organomegaly.  Lymphatics: Non tender without lymphadenopathy.  Musculoskeletal: Full ROM all peripheral extremities, joint stability, 5/5 strength, and normal gait. Skin: Warm, dry without rashes, lesions, ecchymosis.  Neuro: Cranial nerves intact, reflexes equal bilaterally. Normal muscle tone, no cerebellar symptoms. Sensation intact.  Psych: Awake and oriented X 3, normal affect, Insight and Judgment appropriate.   Assessment and Plan:  Hypertension: Continue medication, monitor blood pressure at home. Continue DASH diet. Cholesterol: Continue diet and exercise. Check cholesterol.  diabetes-Long discussion about diet and exercise. Check A1C. And will increase MF to 4 a day,  + P. Neuropathy- declines meds.- stressed controlling sugar Vitamin D Def- check level and continue medications.   Joshua Peterson 12:32 PM

## 2013-02-05 LAB — VITAMIN D 25 HYDROXY (VIT D DEFICIENCY, FRACTURES): Vit D, 25-Hydroxy: 50 ng/mL (ref 30–89)

## 2013-03-07 ENCOUNTER — Other Ambulatory Visit: Payer: Self-pay | Admitting: Internal Medicine

## 2013-03-26 ENCOUNTER — Other Ambulatory Visit: Payer: Self-pay | Admitting: Physician Assistant

## 2013-03-26 MED ORDER — METFORMIN HCL ER 500 MG PO TB24: ORAL_TABLET | ORAL | Status: DC

## 2013-04-30 ENCOUNTER — Telehealth: Payer: Self-pay

## 2013-04-30 NOTE — Telephone Encounter (Signed)
Pt called and is going out of the country soon. Pt is asking when he should start taking Coumadin. Per McK, pt should start coumadin 5mg  5 days before leaving until he returns. lmom to pt.

## 2013-05-05 DIAGNOSIS — Z79899 Other long term (current) drug therapy: Secondary | ICD-10-CM | POA: Insufficient documentation

## 2013-05-05 NOTE — Progress Notes (Signed)
Patient ID: Joshua Peterson, male   DOB: Oct 23, 1940, 73 y.o.   MRN: [id number]    This very nice 73 y.o. MWM presents for 3 month follow up with Hypertension, Hyperlipidemia, T2 NIDDM/Diaobesity and Vitamin D Deficiency.    HTN predates since 2004. BP has been controlled at home. Today's BP: 142/76 mmHg . Patient denies any cardiac type chest pain, palpitations, dyspnea/orthopnea/PND, dizziness, claudication, or dependent edema.   Hyperlipidemia is controlled with diet & meds. Lab Results  Component Value Date   CHOL 155 02/04/2013   HDL 29* 02/04/2013   LDLCALC 79 02/04/2013   TRIG 236* 02/04/2013   CHOLHDL 5.3 02/04/2013            Patient denies myalgias or other med SE's.    Also, the patient has history of T2 NIDDM since 2005 with last A1c of  Lab Results  Component Value Date   HGBA1C 7.3* 02/04/2013   His BMI is 30.5 and he has Truncal Obesity. Random glucose range less than 140 mg %. Patient denies any symptoms of reactive hypoglycemia, diabetic polys, paresthesias or visual blurring.   Also, he has hx/o DVT x 2 in 2009 and 2012- both felt provoked after prolonged plane ride going on mission trips to the [place]. He plans another missions trip in 5 days and is advised to take his usual coumadin daily un til return from his trip.   Further, Patient has history of Vitamin D Deficiency of 14 in 2008 and last vitamin D was 50 in August 2014 and he was advised to increase his dose of Vit D. Patient supplements vitamin D without any suspected side-effects.    Medication List       This list is accurate as of: 05/06/13 10:04 PM.  Always use your most recent med list.               aspirin EC 81 MG tablet  Take 81 mg by mouth 2 (two) times daily.     fenofibrate micronized 134 MG capsule  Commonly known as:  LOFIBRA  134 mg daily.     FLAX SEEDS PO  Take by mouth. Takes 1 daily     losartan 50 MG tablet  Commonly known as:  COZAAR  Take 50 mg by mouth daily.      metFORMIN 500 MG 24 hr tablet  Commonly known as:  GLUCOPHAGE-XR  2 tablets two times daily     MULTIVITAMIN PO  Take 1 capsule by mouth daily.     OMEGA 3 PO  Take 1 capsule by mouth daily.     phentermine 37.5 MG capsule  Take 37.5 mg by mouth every morning.     ranitidine 300 MG tablet  Commonly known as:  ZANTAC  TAKE 1 TABLET BY MOUTH TWICE A DAY, EVERY MORNING & EVERY EVENING     VITAMIN D PO  Take 5,000 Units by mouth daily.     warfarin 5 MG tablet  Commonly known as:  COUMADIN  Take 1 tablet (5 mg total) by mouth daily.         Allergies  Allergen Reactions  . Doxycycline   . Niacin And Related     flushing  . Sulfa Antibiotics Other (See Comments)    PMHx:   Past Medical History  Diagnosis Date  . DVT (deep venous thrombosis)     bilateral legs  . Diabetes mellitus   . Hypertension   . Hyperlipidemia   .  Hx of colonic polyps   . Vitamin D deficiency   . Hypogonadism male   . Obesity     FHx:    Reviewed / unchanged  SHx:    Reviewed / unchanged  Systems Review: Constitutional: Denies fever, chills, wt changes, headaches, insomnia, fatigue, night sweats, change in appetite. Eyes: Denies redness, blurred vision, diplopia, discharge, itchy, watery eyes.  ENT: Denies discharge, congestion, post nasal drip, epistaxis, sore throat, earache, hearing loss, dental pain, tinnitus, vertigo, sinus pain, snoring.  CV: Denies chest pain, palpitations, irregular heartbeat, syncope, dyspnea, diaphoresis, orthopnea, PND, claudication, edema. Respiratory: denies cough, dyspnea, DOE, pleurisy, hoarseness, laryngitis, wheezing.  Gastrointestinal: Denies dysphagia, odynophagia, heartburn, reflux, water brash, abdominal pain or cramps, nausea, vomiting, bloating, diarrhea, constipation, hematemesis, melena, hematochezia,  or hemorrhoids. Genitourinary: Denies dysuria, frequency, urgency, nocturia, hesitancy, discharge, hematuria, flank pain. Musculoskeletal: Denies  arthralgias, myalgias, stiffness, jt. swelling, pain, limp, strain/sprain.  Skin: Denies pruritus, rash, hives, warts, acne, eczema, change in skin lesion(s). Neuro: No weakness, tremor, incoordination, spasms, paresthesia, or pain. Psychiatric: Denies confusion, memory loss, or sensory loss. Endo: Denies change in weight, skin, hair change.  Heme/Lymph: No excessive bleeding, bruising, orenlarged lymph nodes.  Filed Vitals:   05/06/13 1210  BP: 142/76  Pulse: 80  Temp: 97.3 F (36.3 C)  Resp: 18    Estimated body mass index is 30.51 kg/(m^2) as calculated from the following:   Height as of 02/04/13: 5' 11.5" (1.816 m).   Weight as of this encounter: 221 lb 12.8 oz (100.608 kg).  On Exam: Appears well nourished - in no distress. Eyes: PERRLA, EOMs, conjunctiva no swelling or erythema. Sinuses: No frontal/maxillary tenderness ENT/Mouth: EAC's clear, TM's nl w/o erythema, bulging. Nares clear w/o erythema, swelling, exudates. Oropharynx clear without erythema or exudates. Oral hygiene is good. Tongue normal, non obstructing. Hearing intact.  Neck: Supple. Thyroid nl. Car 2+/2+ without bruits, nodes or JVD. Chest: Respirations nl with BS clear & equal w/o rales, rhonchi, wheezing or stridor.  Cor: Heart sounds normal w/ regular rate and rhythm without sig. murmurs, gallops, clicks, or rubs. Peripheral pulses normal and equal  without edema.  Abdomen: Soft & bowel sounds normal. Non-tender w/o guarding, rebound, hernias, masses, or organomegaly.  Lymphatics: Unremarkable.  Musculoskeletal: Full ROM all peripheral extremities, joint stability, 5/5 strength, and normal gait.  Skin: Warm, dry without exposed rashes, lesions, ecchymosis apparent.  Neuro: Cranial nerves intact, reflexes equal bilaterally. Sensory-motor testing grossly intact. Tendon reflexes grossly intact.  Pysch: Alert & oriented x 3. Insight and judgement nl & appropriate. No ideations.  Assessment and Plan:  1.  Hypertension - Continue monitor blood pressure at home. Continue diet/meds same.  2. Hyperlipidemia - Continue diet/meds, exercise,& lifestyle modifications. Continue monitor periodic cholesterol/liver & renal functions   3. Diabetes - continue recommend prudent low glycemic diet, weight control, regular exercise, diabetic monitoring and periodic eye exams.  4. Vitamin D Deficiency - Continue supplementation.  Recommended regular exercise, BP monitoring, weight control, and discussed med and SE's. Recommended labs to assess and monitor clinical status. Further disposition pending results of labs.

## 2013-05-05 NOTE — Patient Instructions (Signed)

## 2013-05-06 ENCOUNTER — Ambulatory Visit (INDEPENDENT_AMBULATORY_CARE_PROVIDER_SITE_OTHER): Payer: 59 | Admitting: Internal Medicine

## 2013-05-06 ENCOUNTER — Encounter: Payer: Self-pay | Admitting: Internal Medicine

## 2013-05-06 VITALS — BP 142/76 | HR 80 | Temp 97.3°F | Resp 18 | Wt 221.8 lb

## 2013-05-06 DIAGNOSIS — E785 Hyperlipidemia, unspecified: Secondary | ICD-10-CM

## 2013-05-06 DIAGNOSIS — E559 Vitamin D deficiency, unspecified: Secondary | ICD-10-CM

## 2013-05-06 DIAGNOSIS — E119 Type 2 diabetes mellitus without complications: Secondary | ICD-10-CM

## 2013-05-06 DIAGNOSIS — I10 Essential (primary) hypertension: Secondary | ICD-10-CM

## 2013-05-06 DIAGNOSIS — Z79899 Other long term (current) drug therapy: Secondary | ICD-10-CM

## 2013-05-06 DIAGNOSIS — E782 Mixed hyperlipidemia: Secondary | ICD-10-CM

## 2013-05-06 LAB — CBC WITH DIFFERENTIAL/PLATELET
BASOS ABS: 0.1 10*3/uL (ref 0.0–0.1)
BASOS PCT: 1 % (ref 0–1)
EOS PCT: 2 % (ref 0–5)
Eosinophils Absolute: 0.1 10*3/uL (ref 0.0–0.7)
HCT: 40.7 % (ref 39.0–52.0)
Hemoglobin: 14.1 g/dL (ref 13.0–17.0)
LYMPHS PCT: 27 % (ref 12–46)
Lymphs Abs: 1.8 10*3/uL (ref 0.7–4.0)
MCH: 31.1 pg (ref 26.0–34.0)
MCHC: 34.6 g/dL (ref 30.0–36.0)
MCV: 89.8 fL (ref 78.0–100.0)
Monocytes Absolute: 0.3 10*3/uL (ref 0.1–1.0)
Monocytes Relative: 5 % (ref 3–12)
Neutro Abs: 4.2 10*3/uL (ref 1.7–7.7)
Neutrophils Relative %: 65 % (ref 43–77)
PLATELETS: 156 10*3/uL (ref 150–400)
RBC: 4.53 MIL/uL (ref 4.22–5.81)
RDW: 14 % (ref 11.5–15.5)
WBC: 6.5 10*3/uL (ref 4.0–10.5)

## 2013-05-06 LAB — HEMOGLOBIN A1C
Hgb A1c MFr Bld: 8.3 % — ABNORMAL HIGH
Mean Plasma Glucose: 192 mg/dL — ABNORMAL HIGH

## 2013-05-06 MED ORDER — WARFARIN SODIUM 5 MG PO TABS: 5.0000 mg | ORAL_TABLET | Freq: Every day | ORAL | Status: DC

## 2013-05-07 LAB — HEPATIC FUNCTION PANEL
ALT: 39 U/L (ref 0–53)
AST: 21 U/L (ref 0–37)
Albumin: 4.8 g/dL (ref 3.5–5.2)
Alkaline Phosphatase: 64 U/L (ref 39–117)
BILIRUBIN TOTAL: 0.8 mg/dL (ref 0.2–1.2)
Bilirubin, Direct: 0.2 mg/dL (ref 0.0–0.3)
Indirect Bilirubin: 0.6 mg/dL (ref 0.2–1.2)
TOTAL PROTEIN: 6.7 g/dL (ref 6.0–8.3)

## 2013-05-07 LAB — BASIC METABOLIC PANEL WITH GFR
BUN: 17 mg/dL (ref 6–23)
CHLORIDE: 105 meq/L (ref 96–112)
CO2: 24 mEq/L (ref 19–32)
CREATININE: 0.92 mg/dL (ref 0.50–1.35)
Calcium: 9.9 mg/dL (ref 8.4–10.5)
GFR, EST NON AFRICAN AMERICAN: 83 mL/min
Glucose, Bld: 153 mg/dL — ABNORMAL HIGH (ref 70–99)
Potassium: 4 mEq/L (ref 3.5–5.3)
Sodium: 139 mEq/L (ref 135–145)

## 2013-05-07 LAB — VITAMIN D 25 HYDROXY (VIT D DEFICIENCY, FRACTURES): VIT D 25 HYDROXY: 55 ng/mL (ref 30–89)

## 2013-05-07 LAB — TSH: TSH: 1.226 u[IU]/mL (ref 0.350–4.500)

## 2013-05-07 LAB — LIPID PANEL
Cholesterol: 169 mg/dL (ref 0–200)
HDL: 28 mg/dL — ABNORMAL LOW (ref >39)
LDL Cholesterol: 67 mg/dL (ref 0–99)
Total CHOL/HDL Ratio: 6 Ratio
Triglycerides: 370 mg/dL — ABNORMAL HIGH (ref <150)
VLDL: 74 mg/dL — ABNORMAL HIGH (ref 0–40)

## 2013-05-07 LAB — MAGNESIUM: MAGNESIUM: 1.7 mg/dL (ref 1.5–2.5)

## 2013-05-07 LAB — INSULIN, FASTING: Insulin fasting, serum: 32 u[IU]/mL — ABNORMAL HIGH (ref 3–28)

## 2013-05-31 ENCOUNTER — Other Ambulatory Visit: Payer: Self-pay | Admitting: Physician Assistant

## 2013-05-31 MED ORDER — FENOFIBRATE 160 MG PO TABS: 160.0000 mg | ORAL_TABLET | Freq: Every day | ORAL | Status: DC

## 2013-07-15 ENCOUNTER — Encounter: Payer: Self-pay | Admitting: Emergency Medicine

## 2013-07-15 ENCOUNTER — Ambulatory Visit (INDEPENDENT_AMBULATORY_CARE_PROVIDER_SITE_OTHER): Payer: 59 | Admitting: Emergency Medicine

## 2013-07-15 VITALS — BP 148/80 | HR 84 | Temp 100.0°F | Resp 18 | Ht 71.5 in | Wt 214.0 lb

## 2013-07-15 DIAGNOSIS — J019 Acute sinusitis, unspecified: Secondary | ICD-10-CM

## 2013-07-15 DIAGNOSIS — J40 Bronchitis, not specified as acute or chronic: Secondary | ICD-10-CM

## 2013-07-15 DIAGNOSIS — J309 Allergic rhinitis, unspecified: Secondary | ICD-10-CM

## 2013-07-15 MED ORDER — PREDNISONE 10 MG PO TABS: ORAL_TABLET | ORAL | Status: DC

## 2013-07-15 MED ORDER — HYDROCODONE-ACETAMINOPHEN 5-325 MG PO TABS: 1.0000 | ORAL_TABLET | Freq: Every day | ORAL | Status: DC

## 2013-07-15 MED ORDER — AZITHROMYCIN 250 MG PO TABS: ORAL_TABLET | ORAL | Status: AC

## 2013-07-15 MED ORDER — BENZONATATE 100 MG PO CAPS: 100.0000 mg | ORAL_CAPSULE | Freq: Three times a day (TID) | ORAL | Status: DC | PRN

## 2013-07-15 MED ORDER — ALBUTEROL SULFATE HFA 108 (90 BASE) MCG/ACT IN AERS: 2.0000 | INHALATION_SPRAY | Freq: Four times a day (QID) | RESPIRATORY_TRACT | Status: DC | PRN

## 2013-07-15 NOTE — Patient Instructions (Signed)
Bronchitis °Bronchitis is swelling (inflammation) of the air tubes leading to your lungs (bronchi). This causes mucus and a cough. If the swelling gets bad, you may have trouble breathing. °HOME CARE  °· Rest. °· Drink enough fluids to keep your pee (urine) clear or pale yellow (unless you have a condition where you have to watch how much you drink). °· Only take medicine as told by your doctor. If you were given antibiotic medicines, finish them even if you start to feel better. °· Avoid smoke, irritating chemicals, and strong smells. These make the problem worse. Quit smoking if you smoke. This helps your lungs heal faster. °· Use a cool mist humidifier. Change the water in the humidifier every day. You can also sit in the bathroom with hot shower running for 5 10 minutes. Keep the door closed. °· See your health care provider as told. °· Wash your hands often. °GET HELP IF: °Your problems do not get better after 1 week. °GET HELP RIGHT AWAY IF:  °· Your fever gets worse. °· You have chills. °· Your chest hurts. °· Your problems breathing get worse. °· You have blood in your mucus. °· You pass out (faint). °· You feel lightheaded. °· You have a bad headache. °· You throw up (vomit) again and again. °MAKE SURE YOU: °· Understand these instructions. °· Will watch your condition. °· Will get help right away if you are not doing well or get worse. °Document Released: 08/24/2007 Document Revised: 12/26/2012 Document Reviewed: 10/30/2012 °[employer]® Patient Information ©2014 [employer]. ° °

## 2013-07-15 NOTE — Progress Notes (Signed)
Subjective:    Patient ID: Joshua Peterson, male    DOB: 01/23/1941, 73 y.o.   MRN: [id number]  HPI Comments: 4 days of increasing cough congestion and sinus pressure. He has production from chest thick. No relief with OTC CoLd medicine. He notes chest burns from coughing. He has mild allergy increase.   Cough Associated symptoms include postnasal drip.      Medication List       This list is accurate as of: 07/15/13  1:01 PM.  Always use your most recent med list.               aspirin EC 81 MG tablet  Take 81 mg by mouth 2 (two) times daily.     fenofibrate 160 MG tablet  Take 1 tablet (160 mg total) by mouth daily.     FLAX SEEDS PO  Take by mouth. Takes 1 daily     losartan 50 MG tablet  Commonly known as:  COZAAR  Take 50 mg by mouth daily.     metFORMIN 500 MG 24 hr tablet  Commonly known as:  GLUCOPHAGE-XR  Take 500 mg by mouth 2 (two) times daily.     MULTIVITAMIN PO  Take 1 capsule by mouth daily.     OMEGA 3 PO  Take 1 capsule by mouth daily.     ranitidine 300 MG tablet  Commonly known as:  ZANTAC  TAKE 1 TABLET BY MOUTH TWICE A DAY, EVERY MORNING & EVERY EVENING     VITAMIN D PO  Take 5,000 Units by mouth daily.     warfarin 5 MG tablet  Commonly known as:  COUMADIN  Take 5 mg by mouth daily. Patient states only takes when going on a long trip       Allergies  Allergen Reactions  . Doxycycline   . Niacin And Related     flushing  . Sulfa Antibiotics Other (See Comments)   Past Medical History  Diagnosis Date  . DVT (deep venous thrombosis)     bilateral legs  . Diabetes mellitus   . Hypertension   . Hyperlipidemia   . Hx of colonic polyps   . Vitamin D deficiency   . Hypogonadism male   . Obesity      Review of Systems  HENT: Positive for congestion and postnasal drip.   Respiratory: Positive for cough.   All other systems reviewed and are negative.  BP 148/80  Pulse 84  Temp(Src) 100 F (37.8 C) (Temporal)  Resp 18  Ht  5' 11.5" (1.816 m)  Wt 214 lb (97.07 kg)  BMI 29.43 kg/m2  SpO2 96%     Objective:   Physical Exam  Nursing note and vitals reviewed. Constitutional: He is oriented to person, place, and time. He appears well-developed and well-nourished.  HENT:  Head: Normocephalic and atraumatic.  Right Ear: External ear normal.  Left Ear: External ear normal.  Nose: Nose normal.  Mouth/Throat: Oropharynx is clear and moist. No oropharyngeal exudate.  Maxillary tenderness, Yellow TMs bilateral   Eyes: Conjunctivae are normal.  Neck: Normal range of motion.  Cardiovascular: Normal rate, regular rhythm, normal heart sounds and intact distal pulses.   Pulmonary/Chest: Effort normal.  Tight/ congested cough   Abdominal: Soft.  Musculoskeletal: Normal range of motion.  Lymphadenopathy:    He has no cervical adenopathy.  Neurological: He is alert and oriented to person, place, and time.  Skin: Skin is warm and dry.  Psychiatric: He  has a normal mood and affect. Judgment normal.          Assessment & Plan:  1. Bronchitis/ Sinusitis- ZPAK, Pred DP 10 mg Both AD, tessalon Perles AD, Albuterol HFA AD  2. Allergic rhinitis- Zyrtec/ Allegra OTC, increase H2o, allergy hygiene explained. Can try OTC Flonase AD

## 2013-08-08 ENCOUNTER — Ambulatory Visit: Payer: 59 | Admitting: Emergency Medicine

## 2013-09-26 ENCOUNTER — Other Ambulatory Visit: Payer: Self-pay | Admitting: Internal Medicine

## 2013-10-29 ENCOUNTER — Telehealth: Payer: Self-pay

## 2013-10-29 ENCOUNTER — Ambulatory Visit: Payer: Self-pay | Admitting: Physician Assistant

## 2013-10-29 ENCOUNTER — Encounter: Payer: Self-pay | Admitting: Internal Medicine

## 2013-10-29 NOTE — Telephone Encounter (Signed)
Patient is wanting to take a trip to [place] in February but is nervous to fly because he has had 2 DVT's. Patient is asking if there is any medications he could take to prevent DVT so he is able to travel. Per Dr. Melford Aase, patient needs to schedule an appointment to discuss Xarelto. Patient is advised to schedule appointment. Patient has CPE in September and will discuss medications then.

## 2013-11-21 ENCOUNTER — Ambulatory Visit (INDEPENDENT_AMBULATORY_CARE_PROVIDER_SITE_OTHER): Payer: 59 | Admitting: Internal Medicine

## 2013-11-21 ENCOUNTER — Encounter: Payer: Self-pay | Admitting: Internal Medicine

## 2013-11-21 VITALS — BP 124/72 | HR 72 | Temp 98.6°F | Resp 18 | Ht 70.75 in | Wt 221.4 lb

## 2013-11-21 DIAGNOSIS — I10 Essential (primary) hypertension: Secondary | ICD-10-CM

## 2013-11-21 DIAGNOSIS — Z0000 Encounter for general adult medical examination without abnormal findings: Secondary | ICD-10-CM

## 2013-11-21 DIAGNOSIS — Z1212 Encounter for screening for malignant neoplasm of rectum: Secondary | ICD-10-CM

## 2013-11-21 DIAGNOSIS — E559 Vitamin D deficiency, unspecified: Secondary | ICD-10-CM

## 2013-11-21 DIAGNOSIS — Z125 Encounter for screening for malignant neoplasm of prostate: Secondary | ICD-10-CM

## 2013-11-21 DIAGNOSIS — E1129 Type 2 diabetes mellitus with other diabetic kidney complication: Secondary | ICD-10-CM

## 2013-11-21 LAB — CBC WITH DIFFERENTIAL/PLATELET
BASOS PCT: 1 % (ref 0–1)
Basophils Absolute: 0.1 10*3/uL (ref 0.0–0.1)
EOS ABS: 0.2 10*3/uL (ref 0.0–0.7)
Eosinophils Relative: 3 % (ref 0–5)
HCT: 40.9 % (ref 39.0–52.0)
Hemoglobin: 14.5 g/dL (ref 13.0–17.0)
Lymphocytes Relative: 33 % (ref 12–46)
Lymphs Abs: 2 10*3/uL (ref 0.7–4.0)
MCH: 31.9 pg (ref 26.0–34.0)
MCHC: 35.5 g/dL (ref 30.0–36.0)
MCV: 89.9 fL (ref 78.0–100.0)
MONO ABS: 0.2 10*3/uL (ref 0.1–1.0)
Monocytes Relative: 4 % (ref 3–12)
NEUTROS PCT: 59 % (ref 43–77)
Neutro Abs: 3.7 10*3/uL (ref 1.7–7.7)
Platelets: 148 10*3/uL — ABNORMAL LOW (ref 150–400)
RBC: 4.55 MIL/uL (ref 4.22–5.81)
RDW: 13.7 % (ref 11.5–15.5)
WBC: 6.2 10*3/uL (ref 4.0–10.5)

## 2013-11-21 LAB — HEMOGLOBIN A1C
HEMOGLOBIN A1C: 7.7 % — AB (ref <5.7)
Mean Plasma Glucose: 174 mg/dL — ABNORMAL HIGH (ref <117)

## 2013-11-21 NOTE — Progress Notes (Signed)
Patient ID: Joshua Peterson, male   DOB: 24-Apr-1940, 73 y.o.   MRN: [id number]  Annual Screening Comprehensive Examination  This very nice 73 y.o.MWM presents for complete physical.  Patient has been followed for HTN, T2_NIDDM, Hyperlipidemia, and Vitamin D Deficiency.   HTN predates since 2004. Patient's BP has been controlled and today's BP is 124/72 mmHg. Patient denies any cardiac symptoms as chest pain, palpitations, shortness of breath, dizziness or ankle swelling. Patient does have hx/o 2 episodes of provoked LE DVT after lon car/plane trips and has had negative coagulation panel.   Patient's hyperlipidemia is controlled at goal with diet and medications. Patient denies myalgias or other medication SE's. Last lipids were Chol 169; HDL  28*; LDL  67; Trig 370 on 05/06/2013.   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 Last A1c was 8.3% in Feb and 6.9% in Aug 2015.   Finally, patient has history of Vitamin D Deficiency of 14 in 2008 and last vitamin D was 55 on 05/06/2013.  Medication Sig  . aspirin EC 81 MG tablet Take 81 mg by mouth 2 (two) times daily.   . Cholecalciferol (VITAMIN D PO) Take 5,000 Units by mouth daily.  . fenofibrate 160 MG tablet Take 1 tablet (160 mg total) by mouth daily.  . Flaxseed Take by mouth. Takes 1 daily  . losartan  50 MG  TAKE 1 TABLET EVERY DAY  . metFORMIN -XR 500 MG 24 hr Take 500 mg by mouth 2 (two) times daily.  . MultiVit-Min Take 1 capsule by mouth daily.  . Omega-3  Take 1 capsule by mouth daily.  . ranitidine 300 MG tablet TAKE 1 TAB TWICE  DAY  . warfarin (COUMADIN) 5 MG tablet Take 5 mg daily - before/during lon trips.   Allergies  Allergen Reactions  . Doxycycline   . Niacin And Related     flushing  . Sulfa Antibiotics Other (See Comments)   Past Medical History  Diagnosis Date  . DVT (deep venous thrombosis)     bilateral legs   . Diabetes mellitus   . Hypertension   . Hyperlipidemia   . Hx of colonic polyps   . Vitamin D deficiency   . Hypogonadism male   . Obesity    Past Surgical History  Procedure Laterality Date  . Ivc filter placed and removed (right leg)      temporary  . Right deep cervical node biopsy  01/03/2001  . Colonoscopy w/ polypectomy  05/28/2007  . Right cheek/mouth biopsy  09/16/2008  . Colonoscopy    . Polypectomy    . Tonsillectomy     Family History  Problem Relation Age of Onset  . Alzheimer's disease Mother   . Diabetes Mother   . Prostate cancer Father   . Colon cancer Neg Hx   . Rectal cancer Neg Hx   . Stomach cancer Neg Hx   . Breast cancer Sister    History   Social History  . Marital Status: Married    Spouse Name: N/A    Number of Children: N/A  . Years of Education: N/A   Occupational History  . 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   Social History Main Topics  . Smoking status: Former Smoker -- 1.00 packs/day for 20 years    Types: Cigarettes    Quit date: 04/16/1991  .  Smokeless tobacco: Never Used  . Alcohol Use: 1.2 oz/week    2 Cans of beer per week     Comment: ocassional (1-2 x a week)  . Drug Use: No  . Sexual Activity: No    ROS Constitutional: Denies fever, chills, weight loss/gain, headaches, insomnia, fatigue, night sweats or change in appetite. Eyes: Denies redness, blurred vision, diplopia, discharge, itchy or watery eyes.  ENT: Denies discharge, congestion, post nasal drip, epistaxis, sore throat, earache, hearing loss, dental pain, Tinnitus, Vertigo, Sinus pain or snoring.  Cardio: Denies chest pain, palpitations, irregular heartbeat, syncope, dyspnea, diaphoresis, orthopnea, PND, claudication or edema Respiratory: denies cough, dyspnea, DOE, pleurisy, hoarseness, laryngitis or wheezing.  Gastrointestinal: Denies dysphagia, heartburn, reflux, water brash, pain, cramps,  nausea, vomiting, bloating, diarrhea, constipation, hematemesis, melena, hematochezia, jaundice or hemorrhoids Genitourinary: Denies dysuria, frequency, urgency, nocturia, hesitancy, discharge, hematuria or flank pain Musculoskeletal: Denies arthralgia, myalgia, stiffness, Jt. Swelling, pain, limp or strain/sprain. Denies Falls. Skin: Denies puritis, rash, hives, warts, acne, eczema or change in skin lesion Neuro: No weakness, tremor, incoordination, spasms, paresthesia or pain Psychiatric: Denies confusion, memory loss or sensory loss. Denies Depression. Endocrine: Denies change in weight, skin, hair change, nocturia, and paresthesia, diabetic polys, visual blurring or hyper / hypo glycemic episodes.  Heme/Lymph: No excessive bleeding, bruising or enlarged lymph nodes.  Physical Exam  BP 124/72  P 72  T 98.6 F   R 18  Ht 5' 10.75"   Wt 221 lb 6.4 oz  BMI 31.10  General Appearance: Over nourished, in no apparent distress. Eyes: PERRLA, EOMs, conjunctiva no swelling or erythema, normal fundi and vessels. Sinuses: No frontal/maxillary tenderness ENT/Mouth: EACs patent / TMs  nl. Nares clear without erythema, swelling, mucoid exudates. Oral hygiene is good. No erythema, swelling, or exudate. Tongue normal, non-obstructing. Tonsils not swollen or erythematous. Hearing normal.  Neck: Supple, thyroid normal. No bruits, nodes or JVD. Respiratory: Respiratory effort normal.  BS equal and clear bilateral without rales, rhonci, wheezing or stridor. Cardio: Heart sounds are normal with regular rate and rhythm and no murmurs, rubs or gallops. Peripheral pulses are normal and equal bilaterally without edema. No aortic or femoral bruits. Chest: symmetric with normal excursions and percussion.  Abdomen: Flat, soft, with bowl sounds. Nontender, no guarding, rebound, hernias, masses, or organomegaly.  Lymphatics: Non tender without lymphadenopathy.  Genitourinary: No hernias.Testes nl. DRE - prostate nl  for age - smooth & firm w/o nodules. Musculoskeletal: Full ROM all peripheral extremities, joint stability, 5/5 strength, and normal gait. Skin: Warm and dry without rashes, lesions, cyanosis, clubbing or  ecchymosis.  Neuro: Cranial nerves intact, reflexes equal bilaterally. Normal muscle tone, no cerebellar symptoms. Sensation intact.  Pysch: Awake and oriented X 3with normal affect, insight and judgment appropriate.   Assessment and Plan  1. Annual Screening Examination 2. Hypertension  3. Hyperlipidemia 4. T2_NIDDM w/CKD 5. Vitamin D Deficiency  Continue prudent diet as discussed, weight control, BP monitoring, regular exercise, and medications as discussed.  Discussed med effects and SE's. Routine screening labs and tests as requested with regular follow-up as recommended.

## 2013-11-22 LAB — BASIC METABOLIC PANEL WITH GFR
BUN: 20 mg/dL (ref 6–23)
CALCIUM: 10.3 mg/dL (ref 8.4–10.5)
CO2: 26 mEq/L (ref 19–32)
Chloride: 105 mEq/L (ref 96–112)
Creat: 0.96 mg/dL (ref 0.50–1.35)
GFR, Est Non African American: 78 mL/min
GLUCOSE: 146 mg/dL — AB (ref 70–99)
Potassium: 4.1 mEq/L (ref 3.5–5.3)
Sodium: 141 mEq/L (ref 135–145)

## 2013-11-22 LAB — MAGNESIUM: MAGNESIUM: 1.7 mg/dL (ref 1.5–2.5)

## 2013-11-22 LAB — HEPATIC FUNCTION PANEL
ALBUMIN: 5 g/dL (ref 3.5–5.2)
ALT: 32 U/L (ref 0–53)
AST: 22 U/L (ref 0–37)
Alkaline Phosphatase: 59 U/L (ref 39–117)
BILIRUBIN INDIRECT: 0.5 mg/dL (ref 0.2–1.2)
Bilirubin, Direct: 0.2 mg/dL (ref 0.0–0.3)
TOTAL PROTEIN: 7.4 g/dL (ref 6.0–8.3)
Total Bilirubin: 0.7 mg/dL (ref 0.2–1.2)

## 2013-11-22 LAB — LIPID PANEL
Cholesterol: 152 mg/dL (ref 0–200)
HDL: 30 mg/dL — ABNORMAL LOW (ref >39)
LDL Cholesterol: 70 mg/dL (ref 0–99)
Total CHOL/HDL Ratio: 5.1 Ratio
Triglycerides: 258 mg/dL — ABNORMAL HIGH (ref <150)
VLDL: 52 mg/dL — ABNORMAL HIGH (ref 0–40)

## 2013-11-22 LAB — URINALYSIS, MICROSCOPIC ONLY
BACTERIA UA: NONE SEEN
CASTS: NONE SEEN
Crystals: NONE SEEN
SQUAMOUS EPITHELIAL / LPF: NONE SEEN

## 2013-11-22 LAB — VITAMIN D 25 HYDROXY (VIT D DEFICIENCY, FRACTURES): VIT D 25 HYDROXY: 57 ng/mL (ref 30–89)

## 2013-11-22 LAB — MICROALBUMIN / CREATININE URINE RATIO
Creatinine, Urine: 127.6 mg/dL
MICROALB/CREAT RATIO: 8 mg/g (ref 0.0–30.0)
Microalb, Ur: 1.02 mg/dL (ref 0.00–1.89)

## 2013-11-22 LAB — TSH: TSH: 1.633 u[IU]/mL (ref 0.350–4.500)

## 2013-11-22 LAB — PSA: PSA: 0.32 ng/mL (ref <=4.00)

## 2013-11-22 LAB — TESTOSTERONE: Testosterone: 40 ng/dL — ABNORMAL LOW (ref 300–890)

## 2013-11-22 LAB — INSULIN, FASTING: INSULIN FASTING, SERUM: 14.7 u[IU]/mL (ref 2.0–19.6)

## 2013-11-22 LAB — VITAMIN B12: Vitamin B-12: 556 pg/mL (ref 211–911)

## 2013-11-23 NOTE — Addendum Note (Signed)
Addended by: Unk Pinto on: 11/23/2013 05:23 PM   Modules accepted: Level of Service

## 2013-11-26 ENCOUNTER — Other Ambulatory Visit: Payer: Self-pay | Admitting: Internal Medicine

## 2014-01-06 ENCOUNTER — Other Ambulatory Visit: Payer: Self-pay | Admitting: Physician Assistant

## 2014-01-12 ENCOUNTER — Other Ambulatory Visit: Payer: Self-pay | Admitting: Physician Assistant

## 2014-02-03 ENCOUNTER — Other Ambulatory Visit: Payer: Self-pay | Admitting: Physician Assistant

## 2014-02-18 ENCOUNTER — Other Ambulatory Visit: Payer: Self-pay | Admitting: Physician Assistant

## 2014-03-08 ENCOUNTER — Other Ambulatory Visit: Payer: Self-pay | Admitting: Internal Medicine

## 2014-03-11 ENCOUNTER — Ambulatory Visit: Payer: Self-pay | Admitting: Physician Assistant

## 2014-03-20 ENCOUNTER — Encounter: Payer: Self-pay | Admitting: Physician Assistant

## 2014-03-20 ENCOUNTER — Ambulatory Visit (INDEPENDENT_AMBULATORY_CARE_PROVIDER_SITE_OTHER): Payer: 59 | Admitting: Physician Assistant

## 2014-03-20 ENCOUNTER — Encounter (INDEPENDENT_AMBULATORY_CARE_PROVIDER_SITE_OTHER): Payer: Self-pay

## 2014-03-20 VITALS — BP 138/78 | HR 76 | Temp 97.7°F | Resp 16 | Ht 71.5 in | Wt 219.0 lb

## 2014-03-20 DIAGNOSIS — Z79899 Other long term (current) drug therapy: Secondary | ICD-10-CM

## 2014-03-20 DIAGNOSIS — I10 Essential (primary) hypertension: Secondary | ICD-10-CM

## 2014-03-20 DIAGNOSIS — E559 Vitamin D deficiency, unspecified: Secondary | ICD-10-CM

## 2014-03-20 DIAGNOSIS — E785 Hyperlipidemia, unspecified: Secondary | ICD-10-CM

## 2014-03-20 DIAGNOSIS — R319 Hematuria, unspecified: Secondary | ICD-10-CM

## 2014-03-20 DIAGNOSIS — E1122 Type 2 diabetes mellitus with diabetic chronic kidney disease: Secondary | ICD-10-CM

## 2014-03-20 DIAGNOSIS — I82409 Acute embolism and thrombosis of unspecified deep veins of unspecified lower extremity: Secondary | ICD-10-CM

## 2014-03-20 DIAGNOSIS — E291 Testicular hypofunction: Secondary | ICD-10-CM

## 2014-03-20 DIAGNOSIS — E669 Obesity, unspecified: Secondary | ICD-10-CM

## 2014-03-20 DIAGNOSIS — N182 Chronic kidney disease, stage 2 (mild): Secondary | ICD-10-CM

## 2014-03-20 LAB — URINALYSIS, ROUTINE W REFLEX MICROSCOPIC
BILIRUBIN URINE: NEGATIVE
Glucose, UA: >1000 mg/dL — AB
HGB URINE DIPSTICK: NEGATIVE
KETONES UR: NEGATIVE mg/dL
Leukocytes, UA: NEGATIVE
Nitrite: NEGATIVE
PROTEIN: NEGATIVE mg/dL
Specific Gravity, Urine: >1.03 — ABNORMAL HIGH (ref 1.005–1.030)
Urobilinogen, UA: 0.2 mg/dL (ref 0.0–1.0)
pH: 5 (ref 5.0–8.0)

## 2014-03-20 LAB — CBC WITH DIFFERENTIAL/PLATELET
Basophils Absolute: 0.1 10*3/uL (ref 0.0–0.1)
Basophils Relative: 1 % (ref 0–1)
EOS ABS: 0.2 10*3/uL (ref 0.0–0.7)
Eosinophils Relative: 3 % (ref 0–5)
HCT: 41.6 % (ref 39.0–52.0)
HEMOGLOBIN: 14.5 g/dL (ref 13.0–17.0)
LYMPHS ABS: 2 10*3/uL (ref 0.7–4.0)
LYMPHS PCT: 32 % (ref 12–46)
MCH: 31.2 pg (ref 26.0–34.0)
MCHC: 34.9 g/dL (ref 30.0–36.0)
MCV: 89.5 fL (ref 78.0–100.0)
MPV: 9.3 fL (ref 8.6–12.4)
Monocytes Absolute: 0.3 10*3/uL (ref 0.1–1.0)
Monocytes Relative: 5 % (ref 3–12)
Neutro Abs: 3.8 10*3/uL (ref 1.7–7.7)
Neutrophils Relative %: 59 % (ref 43–77)
Platelets: 153 10*3/uL (ref 150–400)
RBC: 4.65 MIL/uL (ref 4.22–5.81)
RDW: 13.5 % (ref 11.5–15.5)
WBC: 6.4 10*3/uL (ref 4.0–10.5)

## 2014-03-20 LAB — URINALYSIS, MICROSCOPIC ONLY
BACTERIA UA: NONE SEEN
CASTS: NONE SEEN
SQUAMOUS EPITHELIAL / LPF: NONE SEEN

## 2014-03-20 LAB — BASIC METABOLIC PANEL WITH GFR
BUN: 22 mg/dL (ref 6–23)
CALCIUM: 10 mg/dL (ref 8.4–10.5)
CO2: 26 meq/L (ref 19–32)
Chloride: 102 mEq/L (ref 96–112)
Creat: 1.18 mg/dL (ref 0.50–1.35)
GFR, EST AFRICAN AMERICAN: 70 mL/min
GFR, Est Non African American: 61 mL/min
GLUCOSE: 332 mg/dL — AB (ref 70–99)
Potassium: 4 mEq/L (ref 3.5–5.3)
SODIUM: 139 meq/L (ref 135–145)

## 2014-03-20 LAB — LIPID PANEL
CHOL/HDL RATIO: 7 ratio
Cholesterol: 168 mg/dL (ref 0–200)
HDL: 24 mg/dL — ABNORMAL LOW (ref >39)
Triglycerides: 513 mg/dL — ABNORMAL HIGH (ref <150)

## 2014-03-20 LAB — MAGNESIUM: Magnesium: 1.8 mg/dL (ref 1.5–2.5)

## 2014-03-20 LAB — TSH: TSH: 1.288 u[IU]/mL (ref 0.350–4.500)

## 2014-03-20 LAB — HEPATIC FUNCTION PANEL
ALBUMIN: 4.6 g/dL (ref 3.5–5.2)
ALK PHOS: 66 U/L (ref 39–117)
ALT: 35 U/L (ref 0–53)
AST: 20 U/L (ref 0–37)
BILIRUBIN DIRECT: 0.2 mg/dL (ref 0.0–0.3)
BILIRUBIN INDIRECT: 0.6 mg/dL (ref 0.2–1.2)
BILIRUBIN TOTAL: 0.8 mg/dL (ref 0.2–1.2)
Total Protein: 7.2 g/dL (ref 6.0–8.3)

## 2014-03-20 LAB — HEMOGLOBIN A1C
HEMOGLOBIN A1C: 8.1 % — AB (ref <5.7)
Mean Plasma Glucose: 186 mg/dL — ABNORMAL HIGH (ref <117)

## 2014-03-20 MED ORDER — WARFARIN SODIUM 5 MG PO TABS: 5.0000 mg | ORAL_TABLET | Freq: Every day | ORAL | Status: DC

## 2014-03-20 NOTE — Patient Instructions (Signed)
Recommendations For Diabetic/Prediabetic Patients:   -  Take medications as prescribed  -  Recommend Dr Fara Olden Fuhrman's book "The End of Diabetes "  And "The End of Dieting"- Can get at  [email or web address] and encourage also get the Audio CD book  - AVOID Animal products, ie. Meat - red/white, Poultry and Dairy/especially cheese - Exercise at least 5 times a week for 30 minutes or preferably daily.  - No Smoking - Drink less than 2 drinks a day.  - Monitor your feet for sores - Have yearly Eye Exams - Recommend annual Flu vaccine  - Recommend Pneumovax and Prevnar vaccines - Shingles Vaccine (Zostavax) if over 87 y.o.  Goals:   - BMI less than 24 - Fasting sugar less than 130 or less than 150 if tapering medicines to lose weight  - Systolic BP less than 166  - Diastolic BP less than 80 - Bad LDL Cholesterol less than 70 - Triglycerides less than 150  Targets for Glucose Readings: Time of Check Target for patients WITHOUT Diabetes Target for DIABETICS  Before Meals Less than 100  less than 150  Two hours after meals Less than 200  Less than 250   If your morning sugar is always below 100 then the issue is with your sugar spiking after meals. Try to take your blood sugar approximately 2 hours after eating, this number should be less than 200. If it is not, think about the foods that you ate and better choices you can make.      Bad carbs also include fruit juice, alcohol, and sweet tea. These are empty calories that do not signal to your brain that you are full.   Please remember the good carbs are still carbs which convert into sugar. So please measure them out no more than 1/2-1 cup of rice, oatmeal, pasta, and beans  Veggies are however free foods! Pile them on.   Not all fruit is created equal. Please see the list below, the fruit at the bottom is higher in sugars than the fruit at the top. Please avoid all dried fruits.

## 2014-03-20 NOTE — Progress Notes (Signed)
Assessment and Plan:  Hypertension: Continue medication, monitor blood pressure at home. Continue DASH diet.  Reminder to go to the ER if any CP, SOB, nausea, dizziness, severe HA, changes vision/speech, left arm numbness and tingling and jaw pain. Cholesterol: Continue diet and exercise. Check cholesterol.  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 Check A1C Vitamin D Def- check level and continue medications.  History of DVT's- with travel coming up, will put back on coumadin and he will wear compression stockings for the travel.  Hematuria-? From stone versus history of smoking- recheck urine  Continue diet and meds as discussed. Further disposition pending results of labs. Discussed med's effects and SE's.   HPI 73 y.o. male  presents for 3 month follow up with hypertension, hyperlipidemia, diabetes and vitamin D. His blood pressure has been controlled at home, today their BP is BP: 138/78 mmHg He does workout. He denies chest pain, shortness of breath, dizziness.  He is on cholesterol medication, fenofibrate 134 and denies myalgias. His cholesterol is at goal. The cholesterol was:  11/21/2013: Cholesterol, Total 152; HDL Cholesterol by NMR 30*; LDL (calc) 70; Triglycerides 258* He has been working on diet and exercise for Diabetes with diabetic chronic kidney disease, he is on bASA, he is on ACE/ARB, he is on metformin and denies polydipsia, polyuria and visual disturbances. Last A1C was: 11/21/2013: Hemoglobin-A1c 7.7* Patient is on Vitamin D supplement. 11/21/2013: Vit D, 25-Hydroxy 57  He has history of recurrent DVTs with travel, he is off coumadin at this time but going to [place] in Feb and would like to get back on it for the flight.  Patient did smoke in the past, he does have some blood in the urine last visit, will repeat UA and if still will send to urologist.  BMI is Body mass index is 30.12 kg/(m^2)., he is working on diet and exercise. Wt  Readings from Last 3 Encounters:  03/20/14 219 lb (99.338 kg)  11/21/13 221 lb 6.4 oz (100.426 kg)  07/15/13 214 lb (97.07 kg)     Current Medications:  Current Outpatient Prescriptions on File Prior to Visit  Medication Sig Dispense Refill  . aspirin EC 81 MG tablet Take 81 mg by mouth 2 (two) times daily.     . Cholecalciferol (VITAMIN D PO) Take 5,000 Units by mouth daily.    . fenofibrate 160 MG tablet TAKE 1 TABLET (160 MG TOTAL) BY MOUTH DAILY. 90 tablet 99  . Flaxseed, Linseed, (FLAX SEEDS PO) Take by mouth. Takes 1 daily    . FREESTYLE INSULINX TEST test strip USE AS DIRECTED EVERY DAY 100 each 99  . losartan (COZAAR) 50 MG tablet TAKE 1 TABLET EVERY DAY 90 tablet 2  . metFORMIN (GLUCOPHAGE-XR) 500 MG 24 hr tablet Take 500 mg by mouth 2 (two) times daily.    . metFORMIN (GLUCOPHAGE-XR) 500 MG 24 hr tablet TAKE 2 TABLETS BY MOUTH TWICE DAILY 260 tablet 2  . Multiple Vitamins-Minerals (MULTIVITAMIN PO) Take 1 capsule by mouth daily.    . Omega-3 Fatty Acids (OMEGA 3 PO) Take 1 capsule by mouth daily.    . phentermine (ADIPEX-P) 37.5 MG tablet TAKE 1/2 TO 1 TABLET EVERY MORNING 30 tablet 0  . ranitidine (ZANTAC) 300 MG tablet TAKE 1 TABLET BY MOUTH TWICE A DAY, EVERY MORNING & EVERY EVENING 180 tablet 6  . warfarin (COUMADIN) 5 MG tablet Take 5 mg by mouth daily. Patient states only takes when going on a long  trip     No current facility-administered medications on file prior to visit.   Medical History:  Past Medical History  Diagnosis Date  . DVT (deep venous thrombosis)     bilateral legs  . Diabetes mellitus   . Hypertension   . Hyperlipidemia   . Hx of colonic polyps   . Vitamin D deficiency   . Hypogonadism male   . Obesity    Allergies:  Allergies  Allergen Reactions  . Doxycycline   . Niacin And Related     flushing  . Sulfa Antibiotics Other (See Comments)    Review of Systems:  Review of Systems  Constitutional: Negative.   HENT: Negative.   Eyes:  Negative.   Respiratory: Negative.   Cardiovascular: Negative.   Gastrointestinal: Negative.   Genitourinary: Negative.   Musculoskeletal: Negative.   Skin: Negative.   Neurological: Negative.   Endo/Heme/Allergies: Negative.   Psychiatric/Behavioral: Negative.     Family history- Review and unchanged Social history- Review and unchanged Physical Exam: BP 138/78 mmHg  Pulse 76  Temp(Src) 97.7 F (36.5 C)  Resp 16  Ht 5' 11.5" (1.816 m)  Wt 219 lb (99.338 kg)  BMI 30.12 kg/m2 Wt Readings from Last 3 Encounters:  03/20/14 219 lb (99.338 kg)  11/21/13 221 lb 6.4 oz (100.426 kg)  07/15/13 214 lb (97.07 kg)   General Appearance: Well nourished, in no apparent distress. Eyes: PERRLA, EOMs, conjunctiva no swelling or erythema Sinuses: No Frontal/maxillary tenderness ENT/Mouth: Ext aud canals clear, TMs without erythema, bulging. No erythema, swelling, or exudate on post pharynx.  Tonsils not swollen or erythematous. Hearing normal.  Neck: Supple, thyroid normal.  Respiratory: Respiratory effort normal, BS equal bilaterally without rales, rhonchi, wheezing or stridor.  Cardio: RRR with no MRGs. Brisk peripheral pulses without edema.  Abdomen: Soft, + BS, obese  Non tender, no guarding, rebound, hernias, masses. Lymphatics: Non tender without lymphadenopathy.  Musculoskeletal: Full ROM, 5/5 strength, normal gait.  Skin: Warm, dry without rashes, lesions, ecchymosis.  Neuro: Cranial nerves intact. No cerebellar symptoms. Sensation intact.  Psych: Awake and oriented X 3, normal affect, Insight and Judgment appropriate.    Vicie Mutters, PA-C 9:26 AM [facility]

## 2014-03-21 LAB — URINE CULTURE

## 2014-03-21 LAB — INSULIN, FASTING: Insulin fasting, serum: 25.5 u[IU]/mL — ABNORMAL HIGH (ref 2.0–19.6)

## 2014-03-21 LAB — VITAMIN D 25 HYDROXY (VIT D DEFICIENCY, FRACTURES): Vit D, 25-Hydroxy: 40 ng/mL (ref 30–100)

## 2014-03-29 ENCOUNTER — Other Ambulatory Visit: Payer: Self-pay | Admitting: Emergency Medicine

## 2014-05-15 ENCOUNTER — Other Ambulatory Visit: Payer: Self-pay | Admitting: Physician Assistant

## 2014-06-11 ENCOUNTER — Ambulatory Visit (INDEPENDENT_AMBULATORY_CARE_PROVIDER_SITE_OTHER): Payer: Medicare Other | Admitting: Internal Medicine

## 2014-06-11 ENCOUNTER — Encounter (INDEPENDENT_AMBULATORY_CARE_PROVIDER_SITE_OTHER): Payer: Self-pay

## 2014-06-11 ENCOUNTER — Encounter: Payer: Self-pay | Admitting: Internal Medicine

## 2014-06-11 VITALS — BP 146/80 | HR 76 | Temp 97.7°F | Resp 16 | Ht 71.5 in | Wt 219.4 lb

## 2014-06-11 DIAGNOSIS — E785 Hyperlipidemia, unspecified: Secondary | ICD-10-CM

## 2014-06-11 DIAGNOSIS — I10 Essential (primary) hypertension: Secondary | ICD-10-CM

## 2014-06-11 DIAGNOSIS — Z1331 Encounter for screening for depression: Secondary | ICD-10-CM

## 2014-06-11 DIAGNOSIS — E669 Obesity, unspecified: Secondary | ICD-10-CM

## 2014-06-11 DIAGNOSIS — N182 Chronic kidney disease, stage 2 (mild): Secondary | ICD-10-CM

## 2014-06-11 DIAGNOSIS — Z79899 Other long term (current) drug therapy: Secondary | ICD-10-CM

## 2014-06-11 DIAGNOSIS — E559 Vitamin D deficiency, unspecified: Secondary | ICD-10-CM

## 2014-06-11 DIAGNOSIS — E291 Testicular hypofunction: Secondary | ICD-10-CM

## 2014-06-11 DIAGNOSIS — Z9181 History of falling: Secondary | ICD-10-CM

## 2014-06-11 DIAGNOSIS — E1122 Type 2 diabetes mellitus with diabetic chronic kidney disease: Secondary | ICD-10-CM

## 2014-06-11 LAB — BASIC METABOLIC PANEL WITH GFR
BUN: 23 mg/dL (ref 6–23)
CO2: 22 mEq/L (ref 19–32)
Calcium: 9.7 mg/dL (ref 8.4–10.5)
Chloride: 104 mEq/L (ref 96–112)
Creat: 1.02 mg/dL (ref 0.50–1.35)
GFR, EST AFRICAN AMERICAN: 84 mL/min
GFR, Est Non African American: 73 mL/min
Glucose, Bld: 208 mg/dL — ABNORMAL HIGH (ref 70–99)
POTASSIUM: 4.1 meq/L (ref 3.5–5.3)
SODIUM: 140 meq/L (ref 135–145)

## 2014-06-11 LAB — LIPID PANEL
Cholesterol: 172 mg/dL (ref 0–200)
HDL: 22 mg/dL — ABNORMAL LOW (ref >=40)
Total CHOL/HDL Ratio: 7.8 Ratio
Triglycerides: 417 mg/dL — ABNORMAL HIGH (ref <150)

## 2014-06-11 LAB — HEPATIC FUNCTION PANEL
ALT: 31 U/L (ref 0–53)
AST: 20 U/L (ref 0–37)
Albumin: 4.6 g/dL (ref 3.5–5.2)
Alkaline Phosphatase: 59 U/L (ref 39–117)
BILIRUBIN DIRECT: 0.1 mg/dL (ref 0.0–0.3)
BILIRUBIN INDIRECT: 0.5 mg/dL (ref 0.2–1.2)
TOTAL PROTEIN: 6.9 g/dL (ref 6.0–8.3)
Total Bilirubin: 0.6 mg/dL (ref 0.2–1.2)

## 2014-06-11 LAB — CBC WITH DIFFERENTIAL/PLATELET
BASOS ABS: 0.1 10*3/uL (ref 0.0–0.1)
Basophils Relative: 1 % (ref 0–1)
Eosinophils Absolute: 0.2 10*3/uL (ref 0.0–0.7)
Eosinophils Relative: 3 % (ref 0–5)
HCT: 43.2 % (ref 39.0–52.0)
HEMOGLOBIN: 14.8 g/dL (ref 13.0–17.0)
LYMPHS ABS: 2.3 10*3/uL (ref 0.7–4.0)
LYMPHS PCT: 32 % (ref 12–46)
MCH: 31.5 pg (ref 26.0–34.0)
MCHC: 34.3 g/dL (ref 30.0–36.0)
MCV: 91.9 fL (ref 78.0–100.0)
MPV: 9.5 fL (ref 8.6–12.4)
Monocytes Absolute: 0.4 10*3/uL (ref 0.1–1.0)
Monocytes Relative: 5 % (ref 3–12)
NEUTROS PCT: 59 % (ref 43–77)
Neutro Abs: 4.2 10*3/uL (ref 1.7–7.7)
PLATELETS: 161 10*3/uL (ref 150–400)
RBC: 4.7 MIL/uL (ref 4.22–5.81)
RDW: 13.5 % (ref 11.5–15.5)
WBC: 7.1 10*3/uL (ref 4.0–10.5)

## 2014-06-11 LAB — MAGNESIUM: MAGNESIUM: 1.8 mg/dL (ref 1.5–2.5)

## 2014-06-11 LAB — TSH: TSH: 2.044 u[IU]/mL (ref 0.350–4.500)

## 2014-06-11 NOTE — Progress Notes (Signed)
Patient ID: Joshua Peterson, male   DOB: June 18, 1940, 74 y.o.   MRN: [id number]  MEDICARE ANNUAL WELLNESS VISIT AND OV  Assessment:   1. Essential hypertension  - TSH  2. Hyperlipidemia  - Lipid panel  3. CKD stage 2 due to type 2 diabetes mellitus  - Hemoglobin A1c - Insulin, random  4. Vitamin D deficiency  - Vit D  25 hydroxy (rtn osteoporosis monitoring)  5. Hypogonadism male  - Testosterone  6. Obesity   7. Medication management  - CBC with Differential/Platelet - BASIC METABOLIC PANEL WITH GFR - Hepatic function panel - Magnesium   Plan:   During the course of the visit the patient was educated and counseled about appropriate screening and preventive services including:    Pneumococcal vaccine   Influenza vaccine  Td vaccine  Screening electrocardiogram  Bone densitometry screening  Colorectal cancer screening  Diabetes screening  Glaucoma screening  Nutrition counseling   Advanced directives: requested  Screening recommendations, referrals: Vaccinations: Immunization History  Administered Date(s) Administered  . Pneumococcal-Unspecified 04/02/2008  . Td 04/02/2006  . Zoster 04/03/2007  Influenza vaccine Oct or Nov 2015 Prevnar vaccine ordered Hep B vaccine not indicated  Nutrition assessed and recommended  Colonoscopy 07/12/2012 Recommended yearly ophthalmology/optometry visit for glaucoma screening and checkup Recommended yearly dental visit for hygiene and checkup Advanced directives - yes  Conditions/risks identified: BMI: Discussed weight loss, diet, and increase physical activity.  Increase physical activity: AHA recommends 150 minutes of physical activity a week.  Medications reviewed Diabetes is not at goal, ACE/ARB therapy: Yes. Urinary Incontinence is not an issue: discussed non pharmacology and pharmacology options.  Fall risk: low- discussed PT, home fall assessment, medications.   Subjective:    Joshua Peterson   presents for [employer] Visit and OV.  Prior  medicare wellness visit is not known.This very nice 74 y.o. MWM also  presents for 3 month follow up with Hypertension, Hyperlipidemia, Pre-Diabetes and Vitamin D Deficiency.    Patient is treated for HTN  Since 2004 & BP has been controlled at home, but today's BP is sl elevated at 146/ 80. Patient has had no complaints of any cardiac type chest pain, palpitations, dyspnea/orthopnea/PND, dizziness, claudication, or dependent edema.   Hyperlipidemia is controlled with diet & meds. Patient denies myalgias or other med SE's. Last Lipids were at goal -  Total Chol 172; HDL 22*; LDL NOT CALC; with elevated Triglycerides 417 on 06/11/2014.    Also, the patient has history of  Morbid Obesity (BMI 30+) and consequent T2_NIDDM since 2005 and has had no symptoms of reactive hypoglycemia, diabetic polys, paresthesias or visual blurring.  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 Last A1c was  on  not at goal at  8.1% 03/20/2014 admittedly due to over eating .   Further, the patient also has history of Vitamin D Deficiency and supplements vitamin D without any suspected side-effects. Last vitamin D was  40 on  03/20/2014.  Names of Other Physician/Practitioners you currently use: 1.  [facility] here for primary care 2. Dr Delman Cheadle, eye doctor, last visit 2015 3. Dr Franky Macho, DDS , dentist, last visit upcoming appt Jun 18, 2014  Patient Care Team: Unk Pinto, MD as PCP - General (Internal Medicine)  Medication Review: Medication Sig  . aspirin EC 81 MG tablet Take 81 mg by mouth 2 (two) times daily.   . Cholecalciferol (VITAMIN D  PO) Take 5,000 Units by mouth daily.  . fenofibrate 160 MG tablet TAKE 1 TABLET (160 MG TOTAL) BY MOUTH DAILY.  Marland Kitchen Flaxseed, Linseed, (FLAX SEEDS PO) Take by mouth. Takes 1 daily  . losartan (COZAAR) 50 MG tablet TAKE 1 TABLET  EVERY DAY  . metFORMIN -XR 500 MG 24 hr tablet TAKE 2 TABLETS BY MOUTH TWICE DAILY  . Multiple Vitamins-Minerals  Take 1 capsule by mouth daily.  . Omega-3 Fatty Acids (OMEGA 3 PO) Take 1 capsule by mouth daily.  . ranitidine (ZANTAC) 300 MG tablet TAKE 1 TABLET TWICE A DAY EVERY MORNING AND EVERY EVENING  . warfarin (COUMADIN) 5 MG tablet TAKE 1 TABLET EVERY DAY AS DIRECTED  . phentermine (ADIPEX-P) 37.5 MG tablet TAKE 1/2 TO 1 TABLET EVERY MORNING (Patient not taking: Reported on 06/11/2014)   Current Problems (verified) Patient Active Problem List   Diagnosis Date Noted  . Medication management 05/05/2013  . Vitamin D deficiency   . Hypogonadism male   . Obesity   . DVT of lower extremity (deep venous thrombosis) 05/18/2011  . CKD stage 2 due to type 2 diabetes mellitus 05/18/2011  . Hypertension 05/18/2011  . Hyperlipidemia 05/18/2011   Screening Tests Health Maintenance  Topic Date Due  . OPHTHALMOLOGY EXAM  08/06/1950  . PNA vac Low Risk Adult (2 of 2 - PCV13) 04/02/2009  . INFLUENZA VACCINE  10/19/2013  . HEMOGLOBIN A1C  09/18/2014  . FOOT EXAM  11/22/2014  . URINE MICROALBUMIN  11/22/2014  . TETANUS/TDAP  04/02/2016  . COLONOSCOPY  07/31/2017  . ZOSTAVAX  Completed   Immunization History  Administered Date(s) Administered  . Pneumococcal-Unspecified 04/02/2008  . Td 04/02/2006  . Zoster 04/03/2007   Preventative care: Last colonoscopy: 07/12/2012  History reviewed: allergies, current medications, past family history, past medical history, past social history, past surgical history and problem list  Risk Factors: Tobacco History  Substance Use Topics  . Smoking status: Former Smoker -- 1.00 packs/day for 20 years    Types: Cigarettes    Quit date: 04/16/1991  . Smokeless tobacco: Never Used  . Alcohol Use: 1.2 oz/week    2 Cans of beer per week     Comment: ocassional (1-2 x a week)   He does not smoke.  Patient is a former smoker. Are there smokers in  your home (other than you)?  No  Alcohol Current alcohol use: 2 beers per week(s), 2 liquor drinks per month(s)  Caffeine Current caffeine use: coffee 2-3 cups /day  Exercise Current exercise: walking, weightlifting and yard work  Nutrition/Diet Current diet: in general, an "unhealthy" diet  Cardiac risk factors: advanced age (older than 55 for men, 8 for women), diabetes mellitus, dyslipidemia, hypertension, male gender, obesity (BMI >= 30 kg/m2), sedentary lifestyle and smoking/ tobacco exposure.  Depression Screen (Note: if answer to either of the following is "Yes", a more complete depression screening is indicated)   Q1: Over the past two weeks, have you felt down, depressed or hopeless? No  Q2: Over the past two weeks, have you felt little interest or pleasure in doing things? No  Have you lost interest or pleasure in daily life? No  Do you often feel hopeless? No  Do you cry easily over simple problems? No  Activities of Daily Living In your present state of health, do you have any difficulty performing the following activities?:  Driving? No Managing money?  No Feeding yourself? No Getting from bed to chair? No Climbing a flight  of stairs? No Preparing food and eating?: No Bathing or showering? No Getting dressed: No Getting to the toilet? No Using the toilet:No Moving around from place to place: No In the past year have you fallen or had a near fall?:No   Are you sexually active?  Yes  Do you have more than one partner?  No  Vision Difficulties: No  Hearing Difficulties: No Do you often ask people to speak up or repeat themselves? No Do you experience ringing or noises in your ears? No Do you have difficulty understanding soft or whispered voices? No  Cognition  Do you feel that you have a problem with memory?No  Do you often misplace items? No  Do you feel safe at home?  Yes  Advanced directives Does patient have a [place]?  Yes Does patient have a Living Will? Yes  Past Medical History  Diagnosis Date  . DVT (deep venous thrombosis)     bilateral legs  . Diabetes mellitus   . Hypertension   . Hyperlipidemia   . Hx of colonic polyps   . Vitamin D deficiency   . Hypogonadism male   . Obesity    Past Surgical History  Procedure Laterality Date  . Ivc filter placed and removed (right leg)      temporary  . Right deep cervical node biopsy  01/03/2001  . Colonoscopy w/ polypectomy  05/28/2007  . Right cheek/mouth biopsy  09/16/2008  . Colonoscopy    . Polypectomy    . Tonsillectomy     ROS: Constitutional: Denies fever, chills, weight loss/gain, headaches, insomnia, fatigue, night sweats or change in appetite. Eyes: Denies redness, blurred vision, diplopia, discharge, itchy or watery eyes.  ENT: Denies discharge, congestion, post nasal drip, epistaxis, sore throat, earache, hearing loss, dental pain, Tinnitus, Vertigo, Sinus pain or snoring.  Cardio: Denies chest pain, palpitations, irregular heartbeat, syncope, dyspnea, diaphoresis, orthopnea, PND, claudication or edema Respiratory: denies cough, dyspnea, DOE, pleurisy, hoarseness, laryngitis or wheezing.  Gastrointestinal: Denies dysphagia, heartburn, reflux, water brash, pain, cramps, nausea, vomiting, bloating, diarrhea, constipation, hematemesis, melena, hematochezia, jaundice or hemorrhoids Genitourinary: Denies dysuria, frequency, urgency, nocturia, hesitancy, discharge, hematuria or flank pain Musculoskeletal: Denies arthralgia, myalgia, stiffness, Jt. Swelling, pain, limp or strain/sprain. Denies Falls. Skin: Denies puritis, rash, hives, warts, acne, eczema or change in skin lesion Neuro: No weakness, tremor, incoordination, spasms, paresthesia or pain Psychiatric: Denies confusion, memory loss or sensory loss. Denies Depression. Endocrine: Denies change in weight, skin, hair change, nocturia, and paresthesia, diabetic polys, visual blurring or  hyper / hypo glycemic episodes.  Heme/Lymph: No excessive bleeding, bruising or enlarged lymph nodes.  Objective:     BP 146/80   Pulse 76  Temp 97.7 F   Resp 16  Ht 5' 11.5"   Wt 219 lb 6.4 oz     BMI 30.18   General Appearance:  Alert  Obese WM in no apparent distress. Eyes: PERRLA, EOMs, conjunctiva no swelling or erythema, normal fundi and vessels. Sinuses: No frontal/maxillary tenderness ENT/Mouth: EACs patent / TMs  nl. Nares clear without erythema, swelling, mucoid exudates. Oral hygiene is good. No erythema, swelling, or exudate. Tongue normal, non-obstructing. Tonsils not swollen or erythematous. Hearing normal.  Neck: Supple, thyroid normal. No bruits, nodes or JVD. Respiratory: Respiratory effort normal.  BS equal and clear bilateral without rales, rhonci, wheezing or stridor. Cardio: Heart sounds are normal with regular rate and rhythm and no murmurs, rubs or gallops. Peripheral pulses are normal and equal  bilaterally without edema. No aortic or femoral bruits. Chest: symmetric with normal excursions and percussion.  Abdomen: Flat, soft, with nl bowel sounds. Nontender, no guarding, rebound, hernias, masses, or organomegaly.  Lymphatics: Non tender without lymphadenopathy.   Musculoskeletal: Full ROM all peripheral extremities, joint stability, 5/5 strength, and normal gait. Skin: Warm and dry without rashes, lesions, cyanosis, clubbing or  ecchymosis.  Neuro: Cranial nerves intact, reflexes equal bilaterally. Normal muscle tone, no cerebellar symptoms. Sensation intact to toes bilaterally by monofilament testing.  Pysch: Awake and oriented X 3 with normal affect, insight and judgment appropriate.   Cognitive Testing  Alert? Yes  Normal Appearance? Yes  Oriented to person? Yes  Place? Yes   Time? Yes  Recall of three objects?  Yes  Can perform simple calculations? Yes  Displays appropriate judgment? Yes  Can read the correct time from a watch/clock? Yes  Medicare  Attestation I have personally reviewed: The patient's medical and social history Their use of alcohol, tobacco or illicit drugs Their current medications and supplements The patient's functional ability including ADLs,fall risks, home safety risks, cognitive, and hearing and visual impairment Diet and physical activities Evidence for depression or mood disorders  The patient's weight, height, BMI, and visual acuity have been recorded in the chart.  I have made referrals, counseling, and provided education to the patient based on review of the above and I have provided the patient with a written personalized care plan for preventive services.  Over 40 minutes of exam, counseling, chart review was performed.   Sayer Masini Swade, MD   06/11/2014

## 2014-06-11 NOTE — Patient Instructions (Signed)

## 2014-06-12 LAB — TESTOSTERONE: Testosterone: 17 ng/dL — ABNORMAL LOW (ref 300–890)

## 2014-06-12 LAB — HEMOGLOBIN A1C
Hgb A1c MFr Bld: 7.7 % — ABNORMAL HIGH (ref <5.7)
Mean Plasma Glucose: 174 mg/dL — ABNORMAL HIGH (ref <117)

## 2014-06-12 LAB — INSULIN, RANDOM: Insulin: 17.4 u[IU]/mL (ref 2.0–19.6)

## 2014-06-12 LAB — VITAMIN D 25 HYDROXY (VIT D DEFICIENCY, FRACTURES): Vit D, 25-Hydroxy: 30 ng/mL (ref 30–100)

## 2014-07-16 ENCOUNTER — Other Ambulatory Visit: Payer: Self-pay | Admitting: Internal Medicine

## 2014-08-16 ENCOUNTER — Other Ambulatory Visit: Payer: Self-pay | Admitting: Internal Medicine

## 2014-09-15 ENCOUNTER — Ambulatory Visit (INDEPENDENT_AMBULATORY_CARE_PROVIDER_SITE_OTHER): Payer: Medicare Other | Admitting: Physician Assistant

## 2014-09-15 ENCOUNTER — Encounter (INDEPENDENT_AMBULATORY_CARE_PROVIDER_SITE_OTHER): Payer: Self-pay

## 2014-09-15 VITALS — BP 132/72 | HR 60 | Temp 97.7°F | Resp 16 | Ht 71.5 in | Wt 196.0 lb

## 2014-09-15 DIAGNOSIS — E291 Testicular hypofunction: Secondary | ICD-10-CM

## 2014-09-15 DIAGNOSIS — I10 Essential (primary) hypertension: Secondary | ICD-10-CM

## 2014-09-15 DIAGNOSIS — E785 Hyperlipidemia, unspecified: Secondary | ICD-10-CM

## 2014-09-15 DIAGNOSIS — E559 Vitamin D deficiency, unspecified: Secondary | ICD-10-CM

## 2014-09-15 DIAGNOSIS — E669 Obesity, unspecified: Secondary | ICD-10-CM

## 2014-09-15 DIAGNOSIS — E1122 Type 2 diabetes mellitus with diabetic chronic kidney disease: Secondary | ICD-10-CM

## 2014-09-15 DIAGNOSIS — N182 Chronic kidney disease, stage 2 (mild): Secondary | ICD-10-CM

## 2014-09-15 DIAGNOSIS — Z79899 Other long term (current) drug therapy: Secondary | ICD-10-CM

## 2014-09-15 LAB — CBC WITH DIFFERENTIAL/PLATELET
Basophils Absolute: 0 10*3/uL (ref 0.0–0.1)
Basophils Relative: 1 % (ref 0–1)
Eosinophils Absolute: 0.3 10*3/uL (ref 0.0–0.7)
Eosinophils Relative: 6 % — ABNORMAL HIGH (ref 0–5)
HEMATOCRIT: 39.9 % (ref 39.0–52.0)
HEMOGLOBIN: 13.4 g/dL (ref 13.0–17.0)
LYMPHS PCT: 26 % (ref 12–46)
Lymphs Abs: 1.1 10*3/uL (ref 0.7–4.0)
MCH: 31.1 pg (ref 26.0–34.0)
MCHC: 33.6 g/dL (ref 30.0–36.0)
MCV: 92.6 fL (ref 78.0–100.0)
MONOS PCT: 6 % (ref 3–12)
MPV: 10.5 fL (ref 8.6–12.4)
Monocytes Absolute: 0.3 10*3/uL (ref 0.1–1.0)
NEUTROS ABS: 2.6 10*3/uL (ref 1.7–7.7)
Neutrophils Relative %: 61 % (ref 43–77)
Platelets: 139 10*3/uL — ABNORMAL LOW (ref 150–400)
RBC: 4.31 MIL/uL (ref 4.22–5.81)
RDW: 14 % (ref 11.5–15.5)
WBC: 4.2 10*3/uL (ref 4.0–10.5)

## 2014-09-15 LAB — HEMOGLOBIN A1C
HEMOGLOBIN A1C: 5.9 % — AB (ref <5.7)
Mean Plasma Glucose: 123 mg/dL — ABNORMAL HIGH (ref <117)

## 2014-09-15 NOTE — Patient Instructions (Signed)

## 2014-09-15 NOTE — Progress Notes (Signed)
Assessment and Plan:  Hypertension: Continue medication, monitor blood pressure at home. Continue DASH diet.  Reminder to go to the ER if any CP, SOB, nausea, dizziness, severe HA, changes vision/speech, left arm numbness and tingling and jaw pain. Cholesterol: Continue diet and exercise. Check cholesterol.  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 Check A1C Vitamin D Def- check level and continue medications.  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    Continue diet and meds as discussed. Further disposition pending results of labs. Discussed med's effects and SE's.   HPI 74 y.o. male  presents for 3 month follow up with hypertension, hyperlipidemia, diabetes and vitamin D. He has been off all of his medications for 2 weeks.  His blood pressure has been controlled at home, today their BP is BP: 132/72 mmHg He does workout. He denies chest pain, shortness of breath, dizziness.  He is on cholesterol medication, fenofibrate 134 and denies myalgias. His cholesterol is at goal. The cholesterol was:  06/11/2014: Cholesterol 172; HDL 22*; LDL Cholesterol NOT CALC; Triglycerides 417* He has been working on diet and exercise for Diabetes with diabetic chronic kidney disease, he is on bASA, he is on ACE/ARB, he is on metformin and denies polydipsia, polyuria and visual disturbances. Last A1C was: 06/11/2014: Hgb A1c MFr Bld 7.7* Patient is on Vitamin D supplement. 06/11/2014: Vit D, 25-Hydroxy 30  He has history of recurrent DVTs with travel.   BMI is Body mass index is 26.96 kg/(m^2)., he is working on diet and exercise and has lost a significant amount of weight, down 24 lbs with airhart healthy weight loss. .  Wt Readings from Last 3 Encounters:  09/15/14 196 lb (88.905 kg)  06/11/14 219 lb 6.4 oz (99.519 kg)  03/20/14 219  lb (99.338 kg)     Current Medications:  Current Outpatient Prescriptions on File Prior to Visit  Medication Sig Dispense Refill  . aspirin EC 81 MG tablet Take 81 mg by mouth 2 (two) times daily.     . Cholecalciferol (VITAMIN D PO) Take 5,000 Units by mouth daily.    . fenofibrate 160 MG tablet TAKE 1 TABLET (160 MG TOTAL) BY MOUTH DAILY. 90 tablet 99  . Flaxseed, Linseed, (FLAX SEEDS PO) Take by mouth. Takes 1 daily    . FREESTYLE INSULINX TEST test strip USE AS DIRECTED EVERY DAY 100 each 99  . losartan (COZAAR) 50 MG tablet TAKE 1 TABLET EVERY DAY 90 tablet 2  . metFORMIN (GLUCOPHAGE-XR) 500 MG 24 hr tablet TAKE 2 TABLETS BY MOUTH TWICE DAILY 260 tablet 2  . Multiple Vitamins-Minerals (MULTIVITAMIN PO) Take 1 capsule by mouth daily.    . Omega-3 Fatty Acids (OMEGA 3 PO) Take 1 capsule by mouth daily.    . phentermine (ADIPEX-P) 37.5 MG tablet TAKE 1/2 TO 1 TABLET EVERY MORNING (Patient not taking: Reported on 06/11/2014) 30 tablet 0  . ranitidine (ZANTAC) 300 MG tablet TAKE 1 TABLET TWICE A DAY EVERY MORNING AND EVERY EVENING 180 tablet 3  . warfarin (COUMADIN) 5 MG tablet TAKE 1 TABLET EVERY DAY AS DIRECTED 30 tablet 1  . warfarin (COUMADIN) 5 MG tablet TAKE 1 TABLET EVERY DAY AS DIRECTED 90 tablet 1   No current facility-administered medications on file prior to visit.   Medical History:  Past Medical History  Diagnosis Date  . DVT (deep venous thrombosis)     bilateral legs  . Diabetes mellitus   . Hypertension   . Hyperlipidemia   . Hx of colonic polyps   . Vitamin D deficiency   . Hypogonadism male   . Obesity    Allergies:  Allergies  Allergen Reactions  . Doxycycline   . Niacin And Related     flushing  . Sulfa Antibiotics Other (See Comments)    Review of Systems:  Review of Systems  Constitutional: Negative.   HENT: Negative.   Eyes: Negative.   Respiratory: Negative.   Cardiovascular: Negative.   Gastrointestinal: Negative.   Genitourinary: Negative.    Musculoskeletal: Negative.   Skin: Negative.   Neurological: Negative.   Endo/Heme/Allergies: Negative.   Psychiatric/Behavioral: Negative.     Family history- Review and unchanged Social history- Review and unchanged Physical Exam: BP 132/72 mmHg  Pulse 60  Temp(Src) 97.7 F (36.5 C)  Resp 16  Ht 5' 11.5" (1.816 m)  Wt 196 lb (88.905 kg)  BMI 26.96 kg/m2 Wt Readings from Last 3 Encounters:  09/15/14 196 lb (88.905 kg)  06/11/14 219 lb 6.4 oz (99.519 kg)  03/20/14 219 lb (99.338 kg)   General Appearance: Well nourished, in no apparent distress. Eyes: PERRLA, EOMs, conjunctiva no swelling or erythema Sinuses: No Frontal/maxillary tenderness ENT/Mouth: Ext aud canals clear, TMs without erythema, bulging. No erythema, swelling, or exudate on post pharynx.  Tonsils not swollen or erythematous. Hearing normal.  Neck: Supple, thyroid normal.  Respiratory: Respiratory effort normal, BS equal bilaterally without rales, rhonchi, wheezing or stridor.  Cardio: RRR with no MRGs. Brisk peripheral pulses without edema.  Abdomen: Soft, + BS,  Non tender, no guarding, rebound, hernias, masses. Lymphatics: Non tender without lymphadenopathy.  Musculoskeletal: Full ROM, 5/5 strength, normal gait.  Skin: Warm, dry without rashes, lesions, ecchymosis.  Neuro: Cranial nerves intact. No cerebellar symptoms. Sensation intact.  Psych: Awake and oriented X 3, normal affect, Insight and Judgment appropriate.    Vicie Mutters, PA-C 9:41 AM [facility]

## 2014-09-16 LAB — MAGNESIUM: Magnesium: 1.9 mg/dL (ref 1.5–2.5)

## 2014-09-16 LAB — BASIC METABOLIC PANEL WITH GFR
BUN: 18 mg/dL (ref 6–23)
CALCIUM: 9.6 mg/dL (ref 8.4–10.5)
CO2: 23 meq/L (ref 19–32)
Chloride: 108 mEq/L (ref 96–112)
Creat: 0.93 mg/dL (ref 0.50–1.35)
GFR, Est African American: >89 mL/min
GFR, Est Non African American: 81 mL/min
GLUCOSE: 85 mg/dL (ref 70–99)
Potassium: 3.9 mEq/L (ref 3.5–5.3)
SODIUM: 142 meq/L (ref 135–145)

## 2014-09-16 LAB — HEPATIC FUNCTION PANEL
ALT: 32 U/L (ref 0–53)
AST: 31 U/L (ref 0–37)
Albumin: 4.2 g/dL (ref 3.5–5.2)
Alkaline Phosphatase: 62 U/L (ref 39–117)
BILIRUBIN INDIRECT: 0.6 mg/dL (ref 0.2–1.2)
BILIRUBIN TOTAL: 0.8 mg/dL (ref 0.2–1.2)
Bilirubin, Direct: 0.2 mg/dL (ref 0.0–0.3)
Total Protein: 6.5 g/dL (ref 6.0–8.3)

## 2014-09-16 LAB — LIPID PANEL
Cholesterol: 116 mg/dL (ref 0–200)
HDL: 36 mg/dL — AB (ref >=40)
LDL Cholesterol: 64 mg/dL (ref 0–99)
TRIGLYCERIDES: 80 mg/dL (ref <150)
Total CHOL/HDL Ratio: 3.2 Ratio
VLDL: 16 mg/dL (ref 0–40)

## 2014-09-16 LAB — TESTOSTERONE: Testosterone: 23 ng/dL — ABNORMAL LOW (ref 300–890)

## 2014-09-16 LAB — TSH: TSH: 1.25 u[IU]/mL (ref 0.350–4.500)

## 2014-09-16 LAB — VITAMIN D 25 HYDROXY (VIT D DEFICIENCY, FRACTURES): Vit D, 25-Hydroxy: 47 ng/mL (ref 30–100)

## 2014-09-16 LAB — INSULIN, FASTING: INSULIN FASTING, SERUM: 5.7 u[IU]/mL (ref 2.0–19.6)

## 2014-09-18 ENCOUNTER — Telehealth: Payer: Self-pay

## 2014-09-18 NOTE — Telephone Encounter (Signed)
Received paper note from front office staff, returned call to patient. He was calling in regards in Hemorrhoid issues that he had overnight. States that he bleed overnight quite heavily. Per Vicie Mutters, PA, I advised that he could try suppositories but we would not be able to help him in the office today. He was advised to seek help at the ER if bleeding persist. Patient verbalized understanding.

## 2014-11-26 ENCOUNTER — Encounter: Payer: Self-pay | Admitting: Internal Medicine

## 2014-12-27 ENCOUNTER — Encounter: Payer: Self-pay | Admitting: *Deleted

## 2015-01-14 ENCOUNTER — Ambulatory Visit (INDEPENDENT_AMBULATORY_CARE_PROVIDER_SITE_OTHER): Payer: Medicare Other | Admitting: Internal Medicine

## 2015-01-14 ENCOUNTER — Encounter: Payer: Self-pay | Admitting: Internal Medicine

## 2015-01-14 VITALS — BP 136/72 | HR 60 | Temp 97.3°F | Resp 16 | Ht 70.25 in | Wt 189.8 lb

## 2015-01-14 DIAGNOSIS — Z9181 History of falling: Secondary | ICD-10-CM

## 2015-01-14 DIAGNOSIS — I10 Essential (primary) hypertension: Secondary | ICD-10-CM

## 2015-01-14 DIAGNOSIS — Z1212 Encounter for screening for malignant neoplasm of rectum: Secondary | ICD-10-CM

## 2015-01-14 DIAGNOSIS — Z0000 Encounter for general adult medical examination without abnormal findings: Secondary | ICD-10-CM

## 2015-01-14 DIAGNOSIS — Z6827 Body mass index (BMI) 27.0-27.9, adult: Secondary | ICD-10-CM

## 2015-01-14 DIAGNOSIS — Z1389 Encounter for screening for other disorder: Secondary | ICD-10-CM

## 2015-01-14 DIAGNOSIS — E1122 Type 2 diabetes mellitus with diabetic chronic kidney disease: Secondary | ICD-10-CM

## 2015-01-14 DIAGNOSIS — E785 Hyperlipidemia, unspecified: Secondary | ICD-10-CM

## 2015-01-14 DIAGNOSIS — E349 Endocrine disorder, unspecified: Secondary | ICD-10-CM

## 2015-01-14 DIAGNOSIS — Z125 Encounter for screening for malignant neoplasm of prostate: Secondary | ICD-10-CM

## 2015-01-14 DIAGNOSIS — N182 Chronic kidney disease, stage 2 (mild): Secondary | ICD-10-CM | POA: Diagnosis not present

## 2015-01-14 DIAGNOSIS — Z23 Encounter for immunization: Secondary | ICD-10-CM | POA: Diagnosis not present

## 2015-01-14 DIAGNOSIS — Z79899 Other long term (current) drug therapy: Secondary | ICD-10-CM

## 2015-01-14 DIAGNOSIS — K219 Gastro-esophageal reflux disease without esophagitis: Secondary | ICD-10-CM

## 2015-01-14 DIAGNOSIS — Z789 Other specified health status: Secondary | ICD-10-CM

## 2015-01-14 DIAGNOSIS — Z1331 Encounter for screening for depression: Secondary | ICD-10-CM

## 2015-01-14 DIAGNOSIS — Z0001 Encounter for general adult medical examination with abnormal findings: Secondary | ICD-10-CM

## 2015-01-14 DIAGNOSIS — E559 Vitamin D deficiency, unspecified: Secondary | ICD-10-CM

## 2015-01-14 LAB — CBC WITH DIFFERENTIAL/PLATELET
Basophils Absolute: 0.1 10*3/uL (ref 0.0–0.1)
Basophils Relative: 1 % (ref 0–1)
EOS ABS: 0.1 10*3/uL (ref 0.0–0.7)
Eosinophils Relative: 2 % (ref 0–5)
HCT: 37.3 % — ABNORMAL LOW (ref 39.0–52.0)
HEMOGLOBIN: 13.3 g/dL (ref 13.0–17.0)
LYMPHS ABS: 1.5 10*3/uL (ref 0.7–4.0)
Lymphocytes Relative: 25 % (ref 12–46)
MCH: 32 pg (ref 26.0–34.0)
MCHC: 35.7 g/dL (ref 30.0–36.0)
MCV: 89.7 fL (ref 78.0–100.0)
MONOS PCT: 4 % (ref 3–12)
MPV: 9.3 fL (ref 8.6–12.4)
Monocytes Absolute: 0.2 10*3/uL (ref 0.1–1.0)
NEUTROS PCT: 68 % (ref 43–77)
Neutro Abs: 3.9 10*3/uL (ref 1.7–7.7)
PLATELETS: 155 10*3/uL (ref 150–400)
RBC: 4.16 MIL/uL — ABNORMAL LOW (ref 4.22–5.81)
RDW: 13.1 % (ref 11.5–15.5)
WBC: 5.8 10*3/uL (ref 4.0–10.5)

## 2015-01-14 NOTE — Patient Instructions (Signed)

## 2015-01-14 NOTE — Progress Notes (Signed)
Patient ID: Joshua Peterson, male   DOB: 06-28-1940, 74 y.o.   MRN: [id number]   Annual Medicare Preventative Visit And Comprehensive Evaluation,  Examination & Management     This very nice 74 y.o. MWM presents for presents for a  presents for a Medicare Annual Preventative Visit & comprehensive evaluation and management of multiple medical co-morbidities.  Patient has been followed for HTN, T2_NIDDM, Hyperlipidemia, Testosterone and Vitamin D Deficiency.Patient also has hx/o DVT x 2 (2009 & 2012) each associated with a long plane flight and hypercoag w/u was negative. Patient also has hx/o Testosterone Deficiency (T= 95 in 2011) and had been on injections in the past which were stopped after his episodes of DVT. Marland Kitchen       HTN predates since 2004. Patient's BP has been controlled at home.Today's BP: 136/72 mmHg. Patient denies any cardiac symptoms as chest pain, palpitations, shortness of breath, dizziness or ankle swelling.      Patient's hyperlipidemia is controlled with diet and medications. Patient denies myalgias or other medication SE's. Last lipids were at goal with  Cholesterol 116; HDL 36*; LDL 64; Triglycerides 80 on 09/15/2014.      Patient has T2_NIDDM since 2005 with CKD 2 with GFR 71 ml/min and patient denies reactive hypoglycemic symptoms, visual blurring, diabetic polys or paresthesias. He reports FBG's range 85-95 mg%. Last A1c was 5.9% on 09/15/2014.       Finally, patient has history of Vitamin D Deficiency of 24 in 2008 and last vitamin D was 47 on 09/15/2014.      Medication Sig  . aspirin EC 81 MG tablet Take 81 mg by mouth 2 (two) times daily.   Marland Kitchen VITAMIN D  Take 5,000 Units by mouth daily.  . fenofibrate 160 MG tablet TAKE 1 TABLET (160 MG TOTAL) BY MOUTH DAILY.  Marland Kitchen FLAX SEEDS  Take by mouth. Takes 1 daily  . losartan  50 MG tablet TAKE 1 TABLET EVERY DAY  . metFORMIN -XR 500 MG TAKE 2 TABLETS BY MOUTH TWICE DAILY  . Multiple Vitamins-Minerals  Take 1 capsule by mouth daily.  .  Omega-3  Take 1 capsule by mouth daily.  . phentermine (ADIPEX-P) 37.5 MG tab TAKE 1/2 TO 1 TABLET EVERY MORNING (Patient not taking: Reported on 06/11/2014)  . ranitidine  300 MG tab TAKE 1 TABLET TWICE A DAY EVERY MORNING AND EVERY EVENING  . warfarin (COUMADIN) 5 MG tab TAKE 1 TABLET EVERY DAY AS DIRECTED  . warfarin (COUMADIN) 5 MG tab TAKE 1 TABLET EVERY DAY AS DIRECTED   Allergies  Allergen Reactions  . Doxycycline   . Niacin And Related     flushing  . Sulfa Antibiotics Other (See Comments)   Past Medical History  Diagnosis Date  . DVT (deep venous thrombosis) (HCC)     bilateral legs  . Diabetes mellitus   . Hypertension   . Hyperlipidemia   . Hx of colonic polyps   . Vitamin D deficiency   . Hypogonadism male   . Obesity    Health Maintenance  Topic Date Due  . PNA vac Low Risk Adult (2 of 2 - PCV13) 04/02/2009  . INFLUENZA VACCINE  10/20/2014  . URINE MICROALBUMIN  11/22/2014  . HEMOGLOBIN A1C  03/17/2015  . FOOT EXAM  06/11/2015  . OPHTHALMOLOGY EXAM  12/10/2015  . TETANUS/TDAP  04/02/2016  . COLONOSCOPY  07/31/2017  . ZOSTAVAX  Completed   Immunization History  Administered Date(s) Administered  . Influenza, High Dose Seasonal  PF 01/14/2015  . Pneumococcal Conjugate-13 01/14/2015  . Pneumococcal-Unspecified 04/02/2008  . Td 04/02/2006  . Zoster 04/03/2007   Past Surgical History  Procedure Laterality Date  . Ivc filter placed and removed (right leg)      temporary  . Right deep cervical node biopsy  01/03/2001  . Colonoscopy w/ polypectomy  05/28/2007  . Right cheek/mouth biopsy  09/16/2008  . Colonoscopy    . Polypectomy    . Tonsillectomy     Family History  Problem Relation Age of Onset  . Alzheimer's disease Mother   . Diabetes Mother   . Prostate cancer Father   . Colon cancer Neg Hx   . Rectal cancer Neg Hx   . Stomach cancer Neg Hx   . Breast cancer Sister    Social History   Social History  . Marital Status: Married    Spouse Name:  N/A  . Number of Children: N/A  . Years of Education: N/A   Occupational History  . Retired city Writer.   Social History Main Topics  . Smoking status: Former Smoker -- 1.00 packs/day for 20 years    Types: Cigarettes    Quit date: 04/16/1991  . Smokeless tobacco: Never Used  . Alcohol Use: 1.2 oz/week    2 Cans of beer per week     Comment: ocassional (1-2 x a week)  . Drug Use: No  . Sexual Activity: Active      ROS Constitutional: Denies fever, chills, weight loss/gain, headaches, insomnia,  night sweats or change in appetite. Does c/o fatigue. Eyes: Denies redness, blurred vision, diplopia, discharge, itchy or watery eyes.  ENT: Denies discharge, congestion, post nasal drip, epistaxis, sore throat, earache, hearing loss, dental pain, Tinnitus, Vertigo, Sinus pain or snoring.  Cardio: Denies chest pain, palpitations, irregular heartbeat, syncope, dyspnea, diaphoresis, orthopnea, PND, claudication or edema Respiratory: denies cough, dyspnea, DOE, pleurisy, hoarseness, laryngitis or wheezing.  Gastrointestinal: Denies dysphagia, heartburn, reflux, water brash, pain, cramps, nausea, vomiting, bloating, diarrhea, constipation, hematemesis, melena, hematochezia, jaundice or hemorrhoids Genitourinary: Denies dysuria, frequency, urgency, nocturia, hesitancy, discharge, hematuria or flank pain Musculoskeletal: Denies arthralgia, myalgia, stiffness, Jt. Swelling, pain, limp or strain/sprain. Denies Falls. Skin: Denies puritis, rash, hives, warts, acne, eczema or change in skin lesion Neuro: No weakness, tremor, incoordination, spasms, paresthesia or pain Psychiatric: Denies confusion, memory loss or sensory loss. Denies Depression. Endocrine: Denies change in weight, skin, hair change, nocturia, and paresthesia, diabetic polys, visual blurring or hyper / hypo glycemic episodes.  Heme/Lymph: No excessive bleeding, bruising or enlarged lymph nodes.  Physical Exam  BP 136/72 mmHg   Pulse 60  Temp(Src) 97.3 F (36.3 C)  Resp 16  Ht 5' 10.25" (1.784 m)  Wt 189 lb 12.8 oz (86.093 kg)  BMI 27.05 kg/m2  General Appearance: Well nourished &  in no apparent distress. Eyes: PERRLA, EOMs, conjunctiva no swelling or erythema, normal fundi and vessels. Sinuses: No frontal/maxillary tenderness ENT/Mouth: EACs patent / TMs  nl. Nares clear without erythema, swelling, mucoid exudates. Oral hygiene is good. No erythema, swelling, or exudate. Tongue normal, non-obstructing. Tonsils not swollen or erythematous. Hearing normal.  Neck: Supple, thyroid normal. No bruits, nodes or JVD. Respiratory: Respiratory effort normal.  BS equal and clear bilateral without rales, rhonci, wheezing or stridor. Cardio: Heart sounds are normal with regular rate and rhythm and no murmurs, rubs or gallops. Peripheral pulses are normal and equal bilaterally without edema. No aortic or femoral bruits. Chest: symmetric with normal excursions and percussion.  Abdomen: Flat, soft, with bowel sounds. Nontender, no guarding, rebound, hernias, masses, or organomegaly.  Lymphatics: Non tender without lymphadenopathy.  Genitourinary: No hernias.Testes nl. DRE - prostate nl for age - smooth & firm w/o nodules. Musculoskeletal: Full ROM all peripheral extremities, joint stability, 5/5 strength, and normal gait. Skin: Warm and dry without rashes, lesions, cyanosis, clubbing or  ecchymosis.  Neuro: Cranial nerves intact, reflexes equal bilaterally. Normal muscle tone, no cerebellar symptoms. Sensation intact by monofilament & vibratory testing to the toes bilaterally.  Pysch: Alert and oriented X 3 with normal affect, insight and judgment appropriate.   Assessment and Plan  1. Encounter for general adult medical examination with abnormal findings   2. Essential hypertension  - Microalbumin / creatinine urine ratio - EKG 12-Lead - [place], RETROPERITNL ABD,  LTD - TSH  3. Hyperlipidemia  - Lipid panel  4. CKD  stage 2 due to type 2 diabetes mellitus (HCC)  - HM DIABETES FOOT EXAM - LOW EXTREMITY NEUR EXAM DOCUM - Hemoglobin A1c - Insulin, random  5. Vitamin D deficiency  - Vit D  25 hydroxy   6. Testosterone deficiency   7. Gastroesophageal reflux disease   8. Screening for rectal cancer  - POC Hemoccult Bld/Stl   9. Prostate cancer screening  - PSA  10. Depression screen   11. At low risk for fall   12. BMI 27.0,  adult   13. Medication management  - Urinalysis, Routine w reflex microscopic  - CBC with Differential/Platelet - BASIC METABOLIC PANEL WITH GFR - Hepatic function panel - Magnesium   Continue prudent diet as discussed, weight control, BP monitoring, regular exercise, and medications as discussed.  Discussed med effects and SE's. Routine screening labs and tests as requested with regular follow-up as recommended.  Over 40 minutes of exam, counseling &  chart review was performed

## 2015-01-15 LAB — MICROALBUMIN / CREATININE URINE RATIO: Creatinine, Urine: 27 mg/dL (ref 20–370)

## 2015-01-15 LAB — BASIC METABOLIC PANEL WITH GFR
BUN: 25 mg/dL (ref 7–25)
CHLORIDE: 107 mmol/L (ref 98–110)
CO2: 23 mmol/L (ref 20–31)
Calcium: 9.3 mg/dL (ref 8.6–10.3)
Creat: 1.06 mg/dL (ref 0.70–1.18)
GFR, EST AFRICAN AMERICAN: 80 mL/min (ref >=60)
GFR, Est Non African American: 69 mL/min (ref >=60)
Glucose, Bld: 84 mg/dL (ref 65–99)
POTASSIUM: 3.8 mmol/L (ref 3.5–5.3)
Sodium: 140 mmol/L (ref 135–146)

## 2015-01-15 LAB — HEPATIC FUNCTION PANEL
ALK PHOS: 62 U/L (ref 40–115)
ALT: 19 U/L (ref 9–46)
AST: 19 U/L (ref 10–35)
Albumin: 4 g/dL (ref 3.6–5.1)
BILIRUBIN DIRECT: 0.2 mg/dL (ref <=0.2)
BILIRUBIN INDIRECT: 0.6 mg/dL (ref 0.2–1.2)
BILIRUBIN TOTAL: 0.8 mg/dL (ref 0.2–1.2)
Total Protein: 6.2 g/dL (ref 6.1–8.1)

## 2015-01-15 LAB — URINALYSIS, ROUTINE W REFLEX MICROSCOPIC
BILIRUBIN URINE: NEGATIVE
GLUCOSE, UA: NEGATIVE
HGB URINE DIPSTICK: NEGATIVE
Ketones, ur: NEGATIVE
LEUKOCYTES UA: NEGATIVE
Nitrite: NEGATIVE
PH: 5 (ref 5.0–8.0)
Protein, ur: NEGATIVE
Specific Gravity, Urine: 1.007 (ref 1.001–1.035)

## 2015-01-15 LAB — MAGNESIUM: Magnesium: 1.9 mg/dL (ref 1.5–2.5)

## 2015-01-15 LAB — HEMOGLOBIN A1C
Hgb A1c MFr Bld: 5.5 % (ref <5.7)
Mean Plasma Glucose: 111 mg/dL (ref <117)

## 2015-01-15 LAB — PSA: PSA: 0.46 ng/mL (ref <=4.00)

## 2015-01-15 LAB — LIPID PANEL
CHOL/HDL RATIO: 4.1 ratio (ref <=5.0)
Cholesterol: 128 mg/dL (ref 125–200)
HDL: 31 mg/dL — AB (ref >=40)
LDL Cholesterol: 77 mg/dL (ref <130)
TRIGLYCERIDES: 102 mg/dL (ref <150)
VLDL: 20 mg/dL (ref <30)

## 2015-01-15 LAB — INSULIN, RANDOM: Insulin: 11.2 u[IU]/mL (ref 2.0–19.6)

## 2015-01-15 LAB — VITAMIN D 25 HYDROXY (VIT D DEFICIENCY, FRACTURES): Vit D, 25-Hydroxy: 35 ng/mL (ref 30–100)

## 2015-01-15 LAB — TSH: TSH: 1.356 u[IU]/mL (ref 0.350–4.500)

## 2015-02-17 ENCOUNTER — Ambulatory Visit (INDEPENDENT_AMBULATORY_CARE_PROVIDER_SITE_OTHER): Payer: Medicare Other | Admitting: Internal Medicine

## 2015-02-17 ENCOUNTER — Encounter: Payer: Self-pay | Admitting: Internal Medicine

## 2015-02-17 VITALS — BP 154/82 | HR 72 | Temp 98.0°F | Resp 18 | Ht 70.25 in

## 2015-02-17 DIAGNOSIS — J069 Acute upper respiratory infection, unspecified: Secondary | ICD-10-CM

## 2015-02-17 MED ORDER — PREDNISONE 20 MG PO TABS: ORAL_TABLET | ORAL | Status: DC

## 2015-02-17 NOTE — Progress Notes (Signed)
Patient ID: Joshua Peterson, male   DOB: 1941-03-19, 74 y.o.   MRN: [id number]  HPI  Patient presents to the office for evaluation of cough.  It has been going on for 5 days.  Patient reports dry minimal coughing.  They also endorse change in voice, postnasal drip and sore throat, clear rhinorrhea.  .  They have tried advil cold and sinus.  They report that nothing has worked.  They denies other sick contacts.  Review of Systems  Constitutional: Negative for fever, chills and malaise/fatigue.  HENT: Positive for congestion, sore throat and tinnitus (chronic). Negative for ear pain.   Respiratory: Positive for cough. Negative for shortness of breath and wheezing.   Cardiovascular: Negative for chest pain, palpitations and leg swelling.  Neurological: Negative for headaches.    PE:  Filed Vitals:   02/17/15 1556  BP: 154/82  Pulse: 72  Temp: 98 F (36.7 C)  Resp: 18    General:  Alert and non-toxic, WDWN, NAD HEENT: NCAT, PERLA, EOM normal, no occular discharge or erythema.  Nasal mucosal edema with sinus tenderness to palpation.  Oropharynx clear with minimal oropharyngeal edema and erythema.  Mucous membranes moist and pink. Neck:  Cervical adenopathy Chest:  RRR no MRGs.  Lungs clear to auscultation A&P with no wheezes rhonchi or rales.   Abdomen: +BS x 4 quadrants, soft, non-tender, no guarding, rigidity, or rebound. Skin: warm and dry no rash Neuro: A&Ox4, CN II-XII grossly intact  Assessment and Plan:   1. Acute URI -prednisone -dymista -nasal saline -call if no relief.     Marland Kitchen

## 2015-02-18 ENCOUNTER — Ambulatory Visit: Payer: Self-pay | Admitting: Internal Medicine

## 2015-02-18 ENCOUNTER — Other Ambulatory Visit: Payer: Self-pay | Admitting: Internal Medicine

## 2015-02-19 ENCOUNTER — Other Ambulatory Visit: Payer: Self-pay | Admitting: *Deleted

## 2015-02-19 MED ORDER — GLUCOSE BLOOD VI STRP: ORAL_STRIP | Status: DC

## 2015-05-21 ENCOUNTER — Ambulatory Visit (INDEPENDENT_AMBULATORY_CARE_PROVIDER_SITE_OTHER): Payer: Medicare Other | Admitting: Internal Medicine

## 2015-05-21 ENCOUNTER — Encounter: Payer: Self-pay | Admitting: Internal Medicine

## 2015-05-21 VITALS — BP 124/70 | HR 76 | Temp 98.2°F | Resp 18 | Ht 70.5 in | Wt 206.0 lb

## 2015-05-21 DIAGNOSIS — Z0001 Encounter for general adult medical examination with abnormal findings: Secondary | ICD-10-CM | POA: Diagnosis not present

## 2015-05-21 DIAGNOSIS — Z79899 Other long term (current) drug therapy: Secondary | ICD-10-CM

## 2015-05-21 DIAGNOSIS — E291 Testicular hypofunction: Secondary | ICD-10-CM | POA: Diagnosis not present

## 2015-05-21 DIAGNOSIS — Z0000 Encounter for general adult medical examination without abnormal findings: Secondary | ICD-10-CM

## 2015-05-21 DIAGNOSIS — I10 Essential (primary) hypertension: Secondary | ICD-10-CM

## 2015-05-21 DIAGNOSIS — E669 Obesity, unspecified: Secondary | ICD-10-CM | POA: Diagnosis not present

## 2015-05-21 DIAGNOSIS — E349 Endocrine disorder, unspecified: Secondary | ICD-10-CM

## 2015-05-21 DIAGNOSIS — Z6827 Body mass index (BMI) 27.0-27.9, adult: Secondary | ICD-10-CM | POA: Diagnosis not present

## 2015-05-21 DIAGNOSIS — K219 Gastro-esophageal reflux disease without esophagitis: Secondary | ICD-10-CM | POA: Diagnosis not present

## 2015-05-21 DIAGNOSIS — R6889 Other general symptoms and signs: Secondary | ICD-10-CM

## 2015-05-21 DIAGNOSIS — E559 Vitamin D deficiency, unspecified: Secondary | ICD-10-CM

## 2015-05-21 DIAGNOSIS — I824Z9 Acute embolism and thrombosis of unspecified deep veins of unspecified distal lower extremity: Secondary | ICD-10-CM | POA: Diagnosis not present

## 2015-05-21 DIAGNOSIS — E785 Hyperlipidemia, unspecified: Secondary | ICD-10-CM

## 2015-05-21 DIAGNOSIS — N182 Chronic kidney disease, stage 2 (mild): Secondary | ICD-10-CM

## 2015-05-21 DIAGNOSIS — E1122 Type 2 diabetes mellitus with diabetic chronic kidney disease: Secondary | ICD-10-CM | POA: Diagnosis not present

## 2015-05-21 LAB — BASIC METABOLIC PANEL WITH GFR
BUN: 29 mg/dL — ABNORMAL HIGH (ref 7–25)
CALCIUM: 9.6 mg/dL (ref 8.6–10.3)
CO2: 20 mmol/L (ref 20–31)
CREATININE: 1.51 mg/dL — AB (ref 0.70–1.18)
Chloride: 110 mmol/L (ref 98–110)
GFR, EST AFRICAN AMERICAN: 52 mL/min — AB (ref >=60)
GFR, EST NON AFRICAN AMERICAN: 45 mL/min — AB (ref >=60)
Glucose, Bld: 88 mg/dL (ref 65–99)
Potassium: 3.8 mmol/L (ref 3.5–5.3)
Sodium: 139 mmol/L (ref 135–146)

## 2015-05-21 LAB — LIPID PANEL
Cholesterol: 174 mg/dL (ref 125–200)
HDL: 29 mg/dL — ABNORMAL LOW (ref >=40)
LDL CALC: 89 mg/dL (ref <130)
TRIGLYCERIDES: 279 mg/dL — AB (ref <150)
Total CHOL/HDL Ratio: 6 Ratio — ABNORMAL HIGH (ref <=5.0)
VLDL: 56 mg/dL — ABNORMAL HIGH (ref <30)

## 2015-05-21 LAB — CBC WITH DIFFERENTIAL/PLATELET
BASOS PCT: 1 % (ref 0–1)
Basophils Absolute: 0.1 10*3/uL (ref 0.0–0.1)
EOS ABS: 0.2 10*3/uL (ref 0.0–0.7)
EOS PCT: 3 % (ref 0–5)
HCT: 37.9 % — ABNORMAL LOW (ref 39.0–52.0)
Hemoglobin: 12.6 g/dL — ABNORMAL LOW (ref 13.0–17.0)
Lymphocytes Relative: 30 % (ref 12–46)
Lymphs Abs: 2.1 10*3/uL (ref 0.7–4.0)
MCH: 30.7 pg (ref 26.0–34.0)
MCHC: 33.2 g/dL (ref 30.0–36.0)
MCV: 92.2 fL (ref 78.0–100.0)
MONO ABS: 0.4 10*3/uL (ref 0.1–1.0)
MONOS PCT: 5 % (ref 3–12)
MPV: 9.1 fL (ref 8.6–12.4)
NEUTROS ABS: 4.3 10*3/uL (ref 1.7–7.7)
Neutrophils Relative %: 61 % (ref 43–77)
PLATELETS: 140 10*3/uL — AB (ref 150–400)
RBC: 4.11 MIL/uL — AB (ref 4.22–5.81)
RDW: 14.1 % (ref 11.5–15.5)
WBC: 7.1 10*3/uL (ref 4.0–10.5)

## 2015-05-21 LAB — TSH: TSH: 1.36 m[IU]/L (ref 0.40–4.50)

## 2015-05-21 LAB — HEMOGLOBIN A1C
HEMOGLOBIN A1C: 5.7 % — AB (ref <5.7)
Mean Plasma Glucose: 117 mg/dL — ABNORMAL HIGH (ref <117)

## 2015-05-21 LAB — HEPATIC FUNCTION PANEL
ALBUMIN: 4.2 g/dL (ref 3.6–5.1)
ALK PHOS: 64 U/L (ref 40–115)
ALT: 17 U/L (ref 9–46)
AST: 16 U/L (ref 10–35)
BILIRUBIN DIRECT: 0.1 mg/dL (ref <=0.2)
BILIRUBIN INDIRECT: 0.5 mg/dL (ref 0.2–1.2)
BILIRUBIN TOTAL: 0.6 mg/dL (ref 0.2–1.2)
TOTAL PROTEIN: 6.9 g/dL (ref 6.1–8.1)

## 2015-05-21 NOTE — Progress Notes (Deleted)
Assessment and Plan:  Hypertension:  -Continue medication,  -monitor blood pressure at home.  -Continue DASH diet.   -Reminder to go to the ER if any CP, SOB, nausea, dizziness, severe HA, changes vision/speech, left arm numbness and tingling, and jaw pain.  Cholesterol: -Continue diet and exercise.  -Check cholesterol.   Pre-diabetes: -Continue diet and exercise.  -Check A1C  Vitamin D Def: -check level -continue medications.   Continue diet and meds as discussed. Further disposition pending results of labs.  HPI 75 y.o. male  presents for 3 month follow up with hypertension, hyperlipidemia, prediabetes and vitamin D.   His blood pressure {HAS HAS NOT:18834} been controlled at home, today their BP is  .   He {DOES_DOES [id number] workout. He denies chest pain, shortness of breath, dizziness.   He {ACTION; IS/IS [id number] on cholesterol medication and denies myalgias. His cholesterol {ACTION; IS/IS NOT:[id number]} at goal. The cholesterol last visit was:   Lab Results  Component Value Date   CHOL 128 01/14/2015   HDL 31* 01/14/2015   LDLCALC 77 01/14/2015   TRIG 102 01/14/2015   CHOLHDL 4.1 01/14/2015     He {Has/has not:18111} been working on diet and exercise for prediabetes, and denies {Symptoms; diabetes w/o none:19199}. Last A1C in the office was:  Lab Results  Component Value Date   HGBA1C 5.5 01/14/2015    Patient is on Vitamin D supplement.  Lab Results  Component Value Date   VD25OH 35 01/14/2015      Current Medications:  Current Outpatient Prescriptions on File Prior to Visit  Medication Sig Dispense Refill  . aspirin EC 81 MG tablet Take 81 mg by mouth 2 (two) times daily.     . Cholecalciferol (VITAMIN D PO) Take 5,000 Units by mouth daily.    Marland Kitchen glucose blood (ONE TOUCH ULTRA TEST) test strip USE AS DIRECTED EVERY DAY-DX-E11.22. 100 each 3  . predniSONE (DELTASONE) 20 MG tablet 3 tabs po day one, then 2 tabs daily x 4 days 11 tablet 0   No  current facility-administered medications on file prior to visit.    Medical History:  Past Medical History  Diagnosis Date  . DVT (deep venous thrombosis) (HCC)     bilateral legs  . Diabetes mellitus   . Hypertension   . Hyperlipidemia   . Hx of colonic polyps   . Vitamin D deficiency   . Hypogonadism male   . Obesity     Allergies:  Allergies  Allergen Reactions  . Doxycycline   . Niacin And Related     flushing  . Sulfa Antibiotics Other (See Comments)     Review of Systems:  ROS  Family history- Review and unchanged  Social history- Review and unchanged  Physical Exam: There were no vitals taken for this visit. Wt Readings from Last 3 Encounters:  01/14/15 189 lb 12.8 oz (86.093 kg)  09/15/14 196 lb (88.905 kg)  06/11/14 219 lb 6.4 oz (99.519 kg)    General Appearance: Well nourished well developed, in no apparent distress. Eyes: PERRLA, EOMs, conjunctiva no swelling or erythema ENT/Mouth: Ear canals normal without obstruction, swelling, erythma, discharge.  TMs normal bilaterally.  Oropharynx moist, clear, without exudate, or postoropharyngeal swelling. Neck: Supple, thyroid normal,no cervical adenopathy  Respiratory: Respiratory effort normal, Breath sounds clear A&P without rhonchi, wheeze, or rale.  No retractions, no accessory usage. Cardio: RRR with no MRGs. Brisk peripheral pulses without edema.  Abdomen: Soft, + BS,  Non tender, no guarding, rebound, hernias,  masses. Musculoskeletal: Full ROM, 5/5 strength, {PSY - GAIT AND STATION:22860} gait Skin: Warm, dry without rashes, lesions, ecchymosis.  Neuro: Awake and oriented X 3, Cranial nerves intact. Normal muscle tone, no cerebellar symptoms. Psych: Normal affect, Insight and Judgment appropriate.    Starlyn Skeans, PA-C 10:45 AM [facility]

## 2015-05-21 NOTE — Progress Notes (Signed)
MEDICARE ANNUAL WELLNESS VISIT AND FOLLOW UP Assessment:    1. Essential hypertension -cont diet and exercise -monitor at home -DASH diet - TSH  2. CKD stage 2 due to type 2 diabetes mellitus ([place]) -cont hydration - Hemoglobin A1c  3. Hyperlipidemia -cont diet and exercise - Lipid panel  4. Vitamin D deficiency -cont supplement  5. Testosterone deficiency -not on supplementation -monitor -natural ways to increase has been discussed  6. Medication management  - CBC with Differential/Platelet - BASIC METABOLIC PANEL WITH GFR - Hepatic function panel  7. Deep vein thrombosis (DVT) of distal vein of lower extremity, unspecified chronicity, unspecified laterality ([place]) -resolved  8. Gastroesophageal reflux disease, esophagitis presence not specified -not on meds -tums prn  9. Obesity -diet and exercise  10. BMI 27.0,  adult -diet and exercise    Over 30 minutes of exam, counseling, chart review, and critical decision making was performed  Plan:   During the course of the visit the patient was educated and counseled about appropriate screening and preventive services including:    Pneumococcal vaccine   Influenza vaccine  Prevnar 13  Td vaccine  Screening electrocardiogram  Colorectal cancer screening  Diabetes screening  Glaucoma screening  Nutrition counseling   Conditions/risks identified: BMI: Discussed weight loss, diet, and increase physical activity.  Increase physical activity: AHA recommends 150 minutes of physical activity a week.  Medications reviewed Diabetes is not at goal, ACE/ARB therapy: No, Reason not on Ace Inhibitor/ARB therapy:  is not indicated. Urinary Incontinence is not an issue: discussed non pharmacology and pharmacology options.  Fall risk: low- discussed PT, home fall assessment, medications.    Subjective:  Joshua Peterson is a 75 y.o. male who presents for Medicare Annual Wellness Visit and 3 month follow up for  HTN, hyperlipidemia, prediabetes, and vitamin D Def.  Date of last medicare wellness visit was is unknown.  His blood pressure has been controlled at home, today their BP is BP: 124/70 mmHg He does not workout. He denies chest pain, shortness of breath, dizziness.  He is not on cholesterol medication and denies myalgias. His cholesterol is at goal. The cholesterol last visit was:   Lab Results  Component Value Date   CHOL 128 01/14/2015   HDL 31* 01/14/2015   LDLCALC 77 01/14/2015   TRIG 102 01/14/2015   CHOLHDL 4.1 01/14/2015   He has been working on diet and exercise for prediabetes, and denies foot ulcerations, hyperglycemia, hypoglycemia , increased appetite, nausea, paresthesia of the feet, polydipsia, polyuria, visual disturbances, vomiting and weight loss. Last A1C in the office was:  Lab Results  Component Value Date   HGBA1C 5.5 01/14/2015   Last GFR NonAA   Lab Results  Component Value Date   [facility] 69 01/14/2015   AA  Lab Results  Component Value Date   GFRAA 80 01/14/2015   Patient is on Vitamin D supplement.   Lab Results  Component Value Date   VD25OH 35 01/14/2015      Medication Review: Current Outpatient Prescriptions on File Prior to Visit  Medication Sig Dispense Refill  . aspirin EC 81 MG tablet Take 81 mg by mouth 2 (two) times daily.     . Cholecalciferol (VITAMIN D PO) Take 5,000 Units by mouth daily.    Marland Kitchen glucose blood (ONE TOUCH ULTRA TEST) test strip USE AS DIRECTED EVERY DAY-DX-E11.22. 100 each 3   No current facility-administered medications on file prior to visit.    Current Problems (verified) Patient  Active Problem List   Diagnosis Date Noted  . BMI 27.0,  adult 01/14/2015  . GERD  01/14/2015  . Medication management 05/05/2013  . Vitamin D deficiency   . Testosterone deficiency   . Obesity   . DVT of lower extremity (deep venous thrombosis) ([place]) 05/18/2011  . CKD stage 2 due to type 2 diabetes mellitus ([place]) 05/18/2011  .  Hypertension 05/18/2011  . Hyperlipidemia 05/18/2011    Screening Tests Immunization History  Administered Date(s) Administered  . Influenza, High Dose Seasonal PF 01/14/2015  . Pneumococcal Conjugate-13 01/14/2015  . Pneumococcal-Unspecified 04/02/2008  . Td 04/02/2006  . Zoster 04/03/2007    Preventative care: Last colonoscopy: 2014  Prior vaccinations: TD or Tdap: 2008  Influenza: 2016  Pneumococcal: 2010 Prevnar13: 2016 Shingles/Zostavax: 2009  Names of Other Physician/Practitioners you currently use: 1. [facility] here for primary care 2. , eye doctor, last visit  3. , dentist, last visit  Patient Care Team: Unk Pinto, MD as PCP - General (Internal Medicine)  Past Surgical History  Procedure Laterality Date  . Ivc filter placed and removed (right leg)      temporary  . Right deep cervical node biopsy  01/03/2001  . Colonoscopy w/ polypectomy  05/28/2007  . Right cheek/mouth biopsy  09/16/2008  . Colonoscopy    . Polypectomy    . Tonsillectomy     Family History  Problem Relation Age of Onset  . Alzheimer's disease Mother   . Diabetes Mother   . Prostate cancer Father   . Colon cancer Neg Hx   . Rectal cancer Neg Hx   . Stomach cancer Neg Hx   . Breast cancer Sister    Social History  Substance Use Topics  . Smoking status: Former Smoker -- 1.00 packs/day for 20 years    Types: Cigarettes    Quit date: 04/16/1991  . Smokeless tobacco: Never Used  . Alcohol Use: 1.2 oz/week    2 Cans of beer per week     Comment: ocassional (1-2 x a week)    MEDICARE WELLNESS OBJECTIVES: Tobacco use: He does smoke.  Patient is a former smoker. If yes, counseling given Alcohol Current alcohol use: social drinker Osteoporosis: hypogonadism, History of fracture in the past year: no Fall risk: Low Risk Hearing: normal Visual acuity: normal,  does not perform annual eye exam Diet: well balanced Physical activity: Current  Exercise Habits:: The patient does not participate in regular exercise at present Cardiac risk factors: Cardiac Risk Factors include: advanced age (>56men, >47 women);dyslipidemia;hypertension;male gender;smoking/ tobacco exposure Depression/mood screen:   Depression screen [facility] 2/9 05/21/2015  Decreased Interest 0  Down, Depressed, Hopeless 0  PHQ - 2 Score 0    ADLs:  In your present state of health, do you have any difficulty performing the following activities: 05/21/2015 01/14/2015  Hearing? N N  Vision? N N  Difficulty concentrating or making decisions? N N  Walking or climbing stairs? N N  Dressing or bathing? N N  Doing errands, shopping? N N  Preparing Food and eating ? N -  Using the Toilet? N -  In the past six months, have you accidently leaked urine? N -  Do you have problems with loss of bowel control? N -  Managing your Medications? N -  Managing your Finances? N -  Housekeeping or managing your Housekeeping? N -     Cognitive Testing  Alert? Yes  Normal Appearance?Yes  Oriented to person? Yes  Place? Yes   Time? Yes  Recall of three objects?  Yes  Can perform simple calculations? Yes  Displays appropriate judgment?Yes  Can read the correct time from a watch face?Yes  EOL planning: Type of Advance Directive: Healthcare Power of Attorney, Living will Does patient want to make changes to advanced directive?: No - Patient declined Copy of advanced directive(s) in chart?: No - copy requested   Objective:   Today's Vitals   05/21/15 1049  BP: 124/70  Pulse: 76  Temp: 98.2 F (36.8 C)  TempSrc: Temporal  Resp: 18  Height: 5' 10.5" (1.791 m)  Weight: 206 lb (93.441 kg)   Body mass index is 29.13 kg/(m^2).  General appearance: alert, no distress, WD/WN, male HEENT: normocephalic, sclerae anicteric, TMs pearly, nares patent, no discharge or erythema, pharynx normal Oral cavity: MMM, no lesions Neck: supple, no lymphadenopathy, no thyromegaly, no  masses Heart: RRR, normal S1, S2, no murmurs Lungs: CTA bilaterally, no wheezes, rhonchi, or rales Abdomen: +bs, soft, non tender, non distended, no masses, no hepatomegaly, no splenomegaly Musculoskeletal: nontender, no swelling, no obvious deformity Extremities: no edema, no cyanosis, no clubbing Pulses: 2+ symmetric, upper and lower extremities, normal cap refill Neurological: alert, oriented x 3, CN2-12 intact, strength normal upper extremities and lower extremities, sensation normal throughout, DTRs 2+ throughout, no cerebellar signs, gait normal Psychiatric: normal affect, behavior normal, pleasant   Medicare Attestation I have personally reviewed: The patient's medical and social history Their use of alcohol, tobacco or illicit drugs Their current medications and supplements The patient's functional ability including ADLs,fall risks, home safety risks, cognitive, and hearing and visual impairment Diet and physical activities Evidence for depression or mood disorders  The patient's weight, height, BMI, and visual acuity have been recorded in the chart.  I have made referrals, counseling, and provided education to the patient based on review of the above and I have provided the patient with a written personalized care plan for preventive services.     Starlyn Skeans, PA-C   05/21/2015

## 2015-09-18 ENCOUNTER — Encounter (HOSPITAL_COMMUNITY): Payer: Self-pay | Admitting: Emergency Medicine

## 2015-09-18 ENCOUNTER — Emergency Department (HOSPITAL_COMMUNITY)
Admission: EM | Admit: 2015-09-18 | Discharge: 2015-09-18 | Disposition: A | Discharge status: Home / Self Care | Payer: Medicare Other | Attending: Emergency Medicine | Admitting: Emergency Medicine

## 2015-09-18 ENCOUNTER — Emergency Department (HOSPITAL_COMMUNITY): Payer: Medicare Other

## 2015-09-18 DIAGNOSIS — R600 Localized edema: Secondary | ICD-10-CM | POA: Diagnosis not present

## 2015-09-18 DIAGNOSIS — E119 Type 2 diabetes mellitus without complications: Secondary | ICD-10-CM | POA: Insufficient documentation

## 2015-09-18 DIAGNOSIS — R55 Syncope and collapse: Secondary | ICD-10-CM | POA: Insufficient documentation

## 2015-09-18 DIAGNOSIS — E785 Hyperlipidemia, unspecified: Secondary | ICD-10-CM | POA: Insufficient documentation

## 2015-09-18 DIAGNOSIS — Z87891 Personal history of nicotine dependence: Secondary | ICD-10-CM | POA: Insufficient documentation

## 2015-09-18 DIAGNOSIS — Z7982 Long term (current) use of aspirin: Secondary | ICD-10-CM | POA: Insufficient documentation

## 2015-09-18 DIAGNOSIS — I10 Essential (primary) hypertension: Secondary | ICD-10-CM | POA: Diagnosis not present

## 2015-09-18 LAB — COMPREHENSIVE METABOLIC PANEL
ALBUMIN: 3.8 g/dL (ref 3.5–5.0)
ALT: 24 U/L (ref 17–63)
ANION GAP: 9 (ref 5–15)
AST: 29 U/L (ref 15–41)
Alkaline Phosphatase: 67 U/L (ref 38–126)
BUN: 26 mg/dL — ABNORMAL HIGH (ref 6–20)
CHLORIDE: 108 mmol/L (ref 101–111)
CO2: 23 mmol/L (ref 22–32)
Calcium: 9.1 mg/dL (ref 8.9–10.3)
Creatinine, Ser: 1.66 mg/dL — ABNORMAL HIGH (ref 0.61–1.24)
GFR calc non Af Amer: 39 mL/min — ABNORMAL LOW (ref >60)
GFR, EST AFRICAN AMERICAN: 45 mL/min — AB (ref >60)
GLUCOSE: 173 mg/dL — AB (ref 65–99)
POTASSIUM: 4.1 mmol/L (ref 3.5–5.1)
SODIUM: 140 mmol/L (ref 135–145)
Total Bilirubin: 0.9 mg/dL (ref 0.3–1.2)
Total Protein: 6.3 g/dL — ABNORMAL LOW (ref 6.5–8.1)

## 2015-09-18 LAB — CBC WITH DIFFERENTIAL/PLATELET
BASOS PCT: 1 %
Basophils Absolute: 0 10*3/uL (ref 0.0–0.1)
EOS ABS: 0.2 10*3/uL (ref 0.0–0.7)
EOS PCT: 3 %
HCT: 35.5 % — ABNORMAL LOW (ref 39.0–52.0)
HEMOGLOBIN: 11.9 g/dL — AB (ref 13.0–17.0)
Lymphocytes Relative: 25 %
Lymphs Abs: 2.1 10*3/uL (ref 0.7–4.0)
MCH: 31.9 pg (ref 26.0–34.0)
MCHC: 33.5 g/dL (ref 30.0–36.0)
MCV: 95.2 fL (ref 78.0–100.0)
MONO ABS: 0.3 10*3/uL (ref 0.1–1.0)
MONOS PCT: 4 %
NEUTROS PCT: 68 %
Neutro Abs: 5.5 10*3/uL (ref 1.7–7.7)
PLATELETS: 125 10*3/uL — AB (ref 150–400)
RBC: 3.73 MIL/uL — ABNORMAL LOW (ref 4.22–5.81)
RDW: 12.6 % (ref 11.5–15.5)
WBC: 8.2 10*3/uL (ref 4.0–10.5)

## 2015-09-18 LAB — MAGNESIUM: Magnesium: 1.9 mg/dL (ref 1.7–2.4)

## 2015-09-18 LAB — URINALYSIS, ROUTINE W REFLEX MICROSCOPIC
BILIRUBIN URINE: NEGATIVE
Glucose, UA: 100 mg/dL — AB
KETONES UR: NEGATIVE mg/dL
Leukocytes, UA: NEGATIVE
NITRITE: NEGATIVE
PH: 5.5 (ref 5.0–8.0)
PROTEIN: NEGATIVE mg/dL
Specific Gravity, Urine: 1.017 (ref 1.005–1.030)

## 2015-09-18 LAB — URINE MICROSCOPIC-ADD ON

## 2015-09-18 LAB — PROTIME-INR
INR: 2 — ABNORMAL HIGH (ref 0.00–1.49)
PROTHROMBIN TIME: 22.5 s — AB (ref 11.6–15.2)

## 2015-09-18 LAB — LIPASE, BLOOD: Lipase: 34 U/L (ref 11–51)

## 2015-09-18 IMAGING — DX DG CHEST 2V
2 series · 2 of 2 positions shown · non-contrast
Comparison: [DATE]

CLINICAL DATA: Nausea.  Presyncope.

EXAM:
CHEST  2 VIEW

[chest pa]
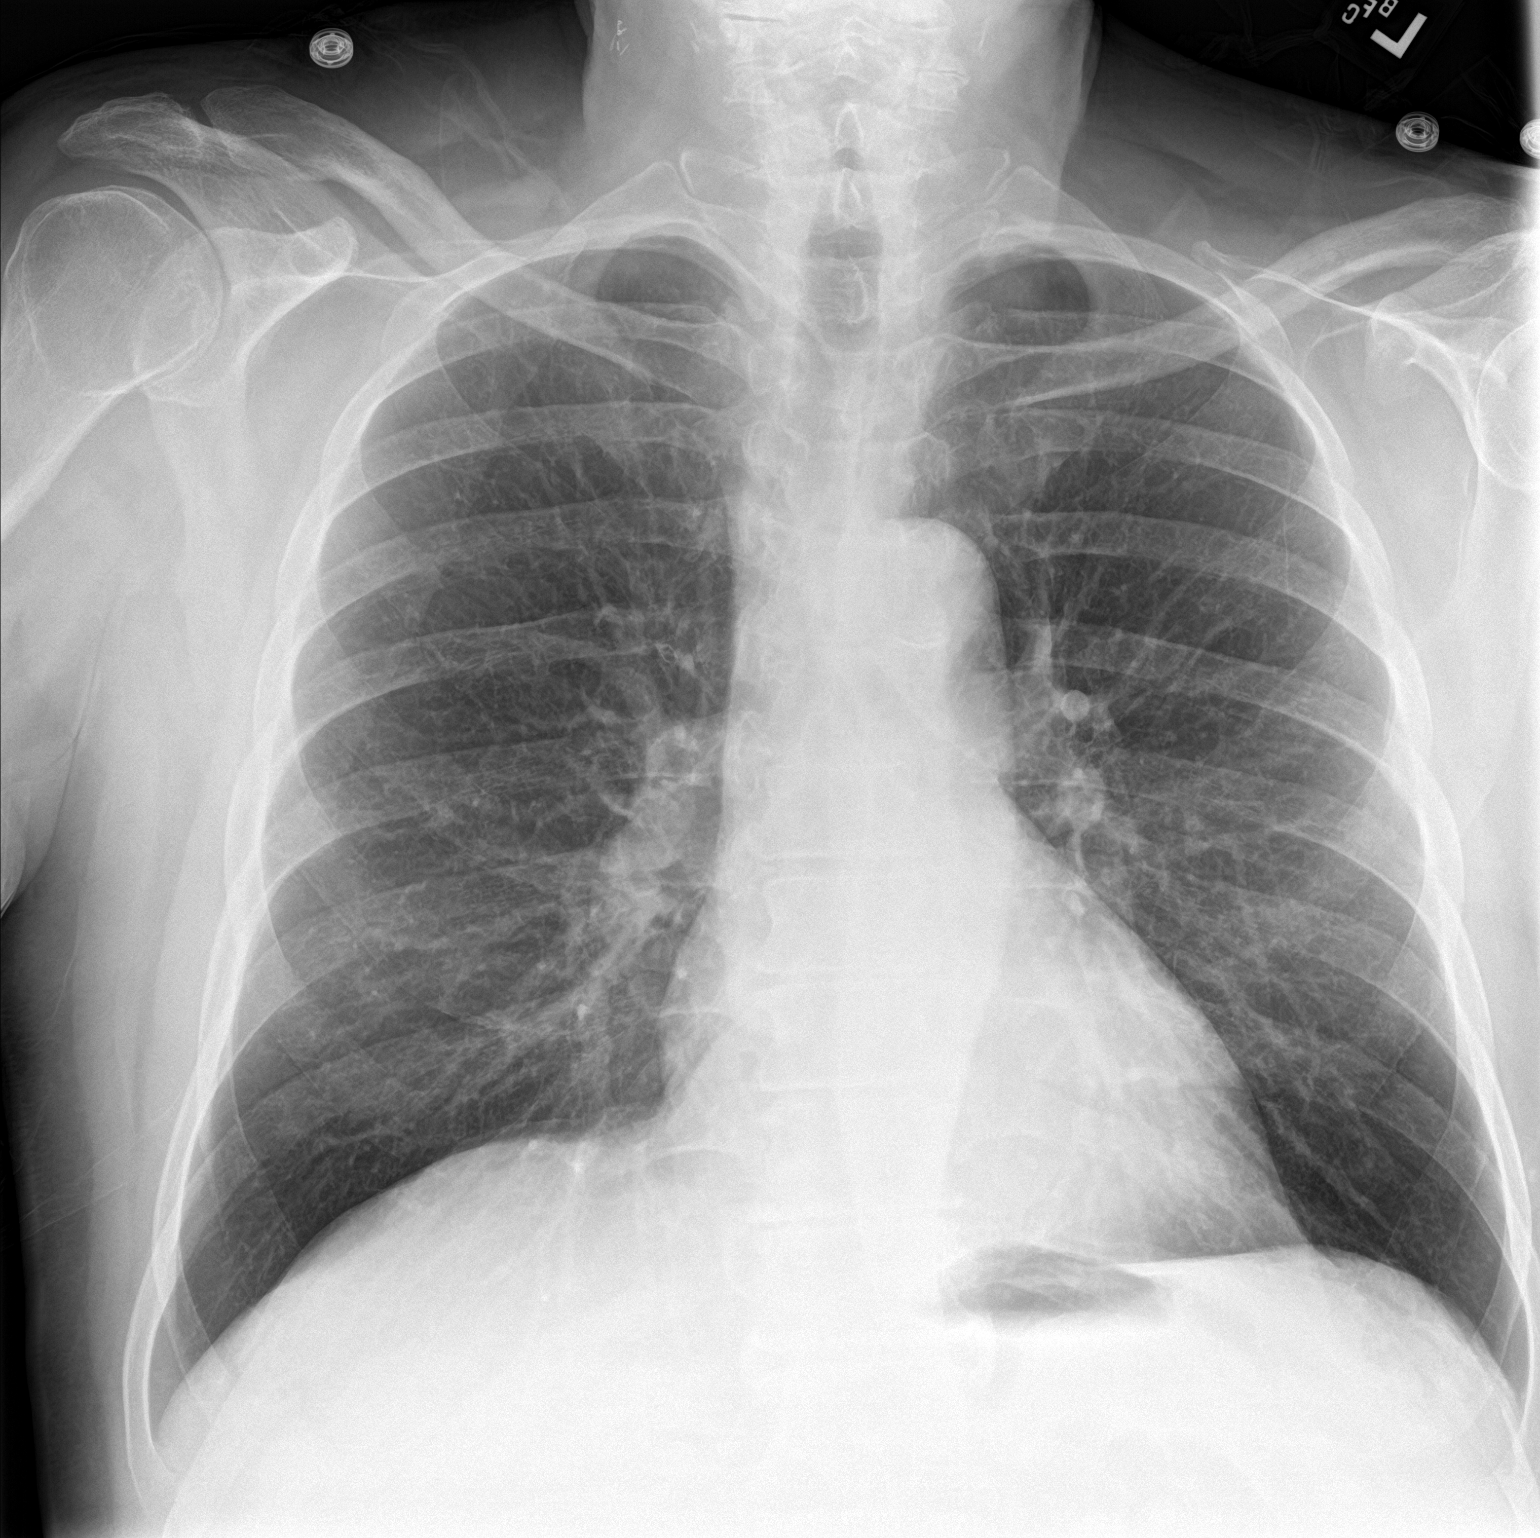

[chest lat]
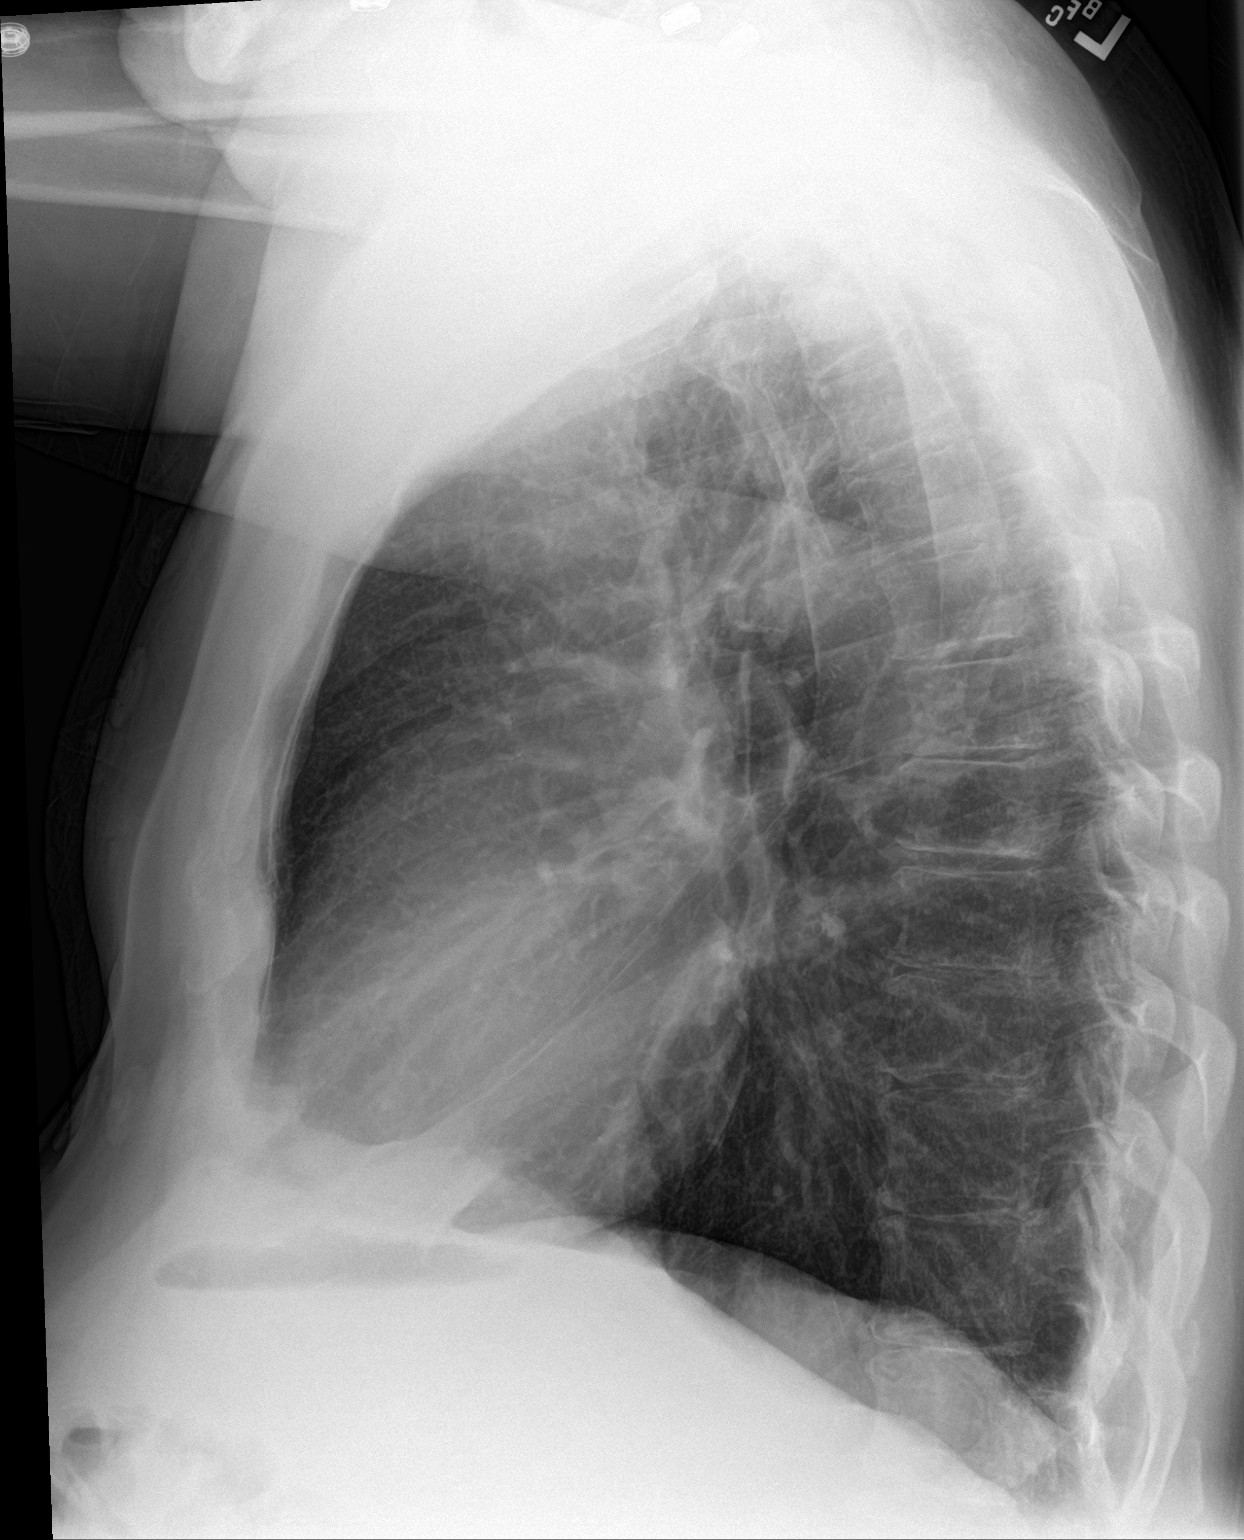

[2 of 2 positions shown; findings below may reference images not displayed]

FINDINGS: The heart size and mediastinal contours are within normal limits.
Both lungs are clear. The visualized skeletal structures are
unremarkable.
IMPRESSION: No active cardiopulmonary disease.

## 2015-09-18 MED ORDER — SODIUM CHLORIDE 0.9 % IV BOLUS (SEPSIS)
1000.0000 mL | Freq: Once | INTRAVENOUS | Status: AC
  Administered 2015-09-18: 1000 mL via INTRAVENOUS

## 2015-09-18 NOTE — Discharge Instructions (Signed)
Near-Syncope Mr. Denley, your blood work shows mild dehydration and you were given IV fluids.  Be sure to stay well hydrated and eat a well balanced meal every day.  See your primary care doctor within 3 days for close follow up. If symptoms worsen, come back to the ED immediately. Thank you. Near-syncope (commonly known as near fainting) is sudden weakness, dizziness, or feeling like you might pass out. This can happen when getting up or while standing for a long time. It is caused by a sudden decrease in blood flow to the brain, which can occur for various reasons. Most of the reasons are not serious.  HOME CARE Watch your condition for any changes.  Have someone stay with you until you feel stable.  If you feel like you are going to pass out:  Lie down right away.  Prop your feet up if you can.  Breathe deeply and steadily.  Move only when the feeling has gone away. Most of the time, this feeling lasts only a few minutes. You may feel tired for several hours.  Drink enough fluids to keep your pee (urine) clear or pale yellow.  If you are taking blood pressure or heart medicine, stand up slowly.  Follow up with your doctor as told. GET HELP RIGHT AWAY IF:   You have a severe headache.  You have unusual pain in the chest, belly (abdomen), or back.  You have bleeding from the mouth or butt (rectum), or you have black or tarry poop (stool).  You feel your heart beat differently than normal, or you have a very fast pulse.  You pass out, or you twitch and shake when you pass out.  You pass out when sitting or lying down.  You feel confused.  You have trouble walking.  You are weak.  You have vision problems. MAKE SURE YOU:   Understand these instructions.  Will watch your condition.  Will get help right away if you are not doing well or get worse.   This information is not intended to replace advice given to you by your health care provider. Make sure you discuss any  questions you have with your health care provider.   Document Released: 08/24/2007 Document Revised: 03/28/2014 Document Reviewed: 08/10/2012 Elsevier Interactive Patient Education 2016 Elsevier Inc. Hematuria, Adult Hematuria is blood in your urine. It can be caused by a bladder infection, kidney infection, prostate infection, kidney stone, or cancer of your urinary tract. Infections can usually be treated with medicine, and a kidney stone usually will pass through your urine. If neither of these is the cause of your hematuria, further workup to find out the reason may be needed. It is very important that you tell your health care provider about any blood you see in your urine, even if the blood stops without treatment or happens without causing pain. Blood in your urine that happens and then stops and then happens again can be a symptom of a very serious condition. Also, pain is not a symptom in the initial stages of many urinary cancers. HOME CARE INSTRUCTIONS   Drink lots of fluid, 3-4 quarts a day. If you have been diagnosed with an infection, cranberry juice is especially recommended, in addition to large amounts of water.  Avoid caffeine, tea, and carbonated beverages because they tend to irritate the bladder.  Avoid alcohol because it may irritate the prostate.  Take all medicines as directed by your health care provider.  If you were prescribed an  antibiotic medicine, finish it all even if you start to feel better.  If you have been diagnosed with a kidney stone, follow your health care provider's instructions regarding straining your urine to catch the stone.  Empty your bladder often. Avoid holding urine for long periods of time.  After a bowel movement, women should cleanse front to back. Use each tissue only once.  Empty your bladder before and after sexual intercourse if you are a male. SEEK MEDICAL CARE IF:  You develop back pain.  You have a fever.  You have a  feeling of sickness in your stomach (nausea) or vomiting.  Your symptoms are not better in 3 days. Return sooner if you are getting worse. SEEK IMMEDIATE MEDICAL CARE IF:   You develop severe vomiting and are unable to keep the medicine down.  You develop severe back or abdominal pain despite taking your medicines.  You begin passing a large amount of blood or clots in your urine.  You feel extremely weak or faint, or you pass out. MAKE SURE YOU:   Understand these instructions.  Will watch your condition.  Will get help right away if you are not doing well or get worse.   This information is not intended to replace advice given to you by your health care provider. Make sure you discuss any questions you have with your health care provider.   Document Released: 03/07/2005 Document Revised: 03/28/2014 Document Reviewed: 11/05/2012 Elsevier Interactive Patient Education [employer].

## 2015-09-18 NOTE — ED Provider Notes (Signed)
CSN: [id number]     Arrival date & time 09/18/15  0315 History  By signing my name below, I, Joshua Peterson, attest that this documentation has been prepared under the direction and in the presence of Joshua Balls, MD. Electronically Signed: Judithann Peterson, ED Scribe. 09/18/2015. 3:37 AM.     No chief complaint on file.  Patient is a 75 y.o. male presenting with near-syncope. The history is provided by the patient. No language interpreter was used.  Near Syncope The current episode started less than 1 hour ago. The problem has been gradually improving. Pertinent negatives include no chest pain and no shortness of breath. Nothing aggravates the symptoms. Nothing relieves the symptoms. He has tried nothing for the symptoms.   HPI Comments: Joshua Peterson is a 75 y.o. male with a hx of DM, HTN, HLD, and DVT who presents to the Emergency Department for evaluation s/p near syncopal episode that occurred approx. one hour ago. Pt reports associated diaphoresis, nausea, and visual disturbances with seeing flutters. He states that laying down is gradually alleviating his symptoms. Pt has not tried any medications PTA. He explains that the  last time he had these symptoms, he was admitted for 3 days and told that his symptoms could be due to loss of oxygen to brain. He denies any vomiting, diarrhea, chest pain, SOB, numbness, or weakness.    Past Medical History  Diagnosis Date  . DVT (deep venous thrombosis) ([facility])     bilateral legs  . Diabetes mellitus   . Hypertension   . Hyperlipidemia   . Hx of colonic polyps   . Vitamin D deficiency   . Hypogonadism male   . Obesity    Past Surgical History  Procedure Laterality Date  . Ivc filter placed and removed (right leg)      temporary  . Right deep cervical node biopsy  01/03/2001  . Colonoscopy w/ polypectomy  05/28/2007  . Right cheek/mouth biopsy  09/16/2008  . Colonoscopy    . Polypectomy    . Tonsillectomy     Family History  Problem  Relation Age of Onset  . Alzheimer's disease Mother   . Diabetes Mother   . Prostate cancer Father   . Colon cancer Neg Hx   . Rectal cancer Neg Hx   . Stomach cancer Neg Hx   . Breast cancer Sister    Social History  Substance Use Topics  . Smoking status: Former Smoker -- 1.00 packs/day for 20 years    Types: Cigarettes    Quit date: 04/16/1991  . Smokeless tobacco: Never Used  . Alcohol Use: 1.2 oz/week    2 Cans of beer per week     Comment: ocassional (1-2 x a week)    Review of Systems  Respiratory: Negative for shortness of breath.   Cardiovascular: Positive for near-syncope. Negative for chest pain.  Gastrointestinal: Positive for nausea. Negative for vomiting and diarrhea.  Neurological: Positive for syncope (near syncope). Negative for weakness and numbness.  All other systems reviewed and are negative.     Allergies  Doxycycline; Niacin and related; and Sulfa antibiotics  Home Medications   Prior to Admission medications   Medication Sig Start Date End Date Taking? Authorizing Provider  aspirin EC 81 MG tablet Take 81 mg by mouth 2 (two) times daily.     Historical Provider, MD  Cholecalciferol (VITAMIN D PO) Take 5,000 Units by mouth daily.    Historical Provider, MD  glucose blood (ONE TOUCH  ULTRA TEST) test strip USE AS DIRECTED EVERY DAY-DX-E11.22. 02/19/15   Vicie Mutters, PA-C   There were no vitals taken for this visit. Physical Exam  Constitutional: He is oriented to person, place, and time. Vital signs are normal. He appears well-developed and well-nourished.  Non-toxic appearance. He does not appear ill. No distress.  HENT:  Head: Normocephalic and atraumatic.  Nose: Nose normal.  Mouth/Throat: Oropharynx is clear and moist. No oropharyngeal exudate.  Eyes: Conjunctivae and EOM are normal. Pupils are equal, round, and reactive to light. No scleral icterus.  Neck: Normal range of motion. Neck supple. No tracheal deviation, no edema, no erythema and  normal range of motion present. No thyroid mass and no thyromegaly present.  Cardiovascular: Normal rate, regular rhythm, S1 normal, S2 normal, normal heart sounds, intact distal pulses and normal pulses.  Exam reveals no gallop and no friction rub.   No murmur heard. Pulmonary/Chest: Effort normal and breath sounds normal. No respiratory distress. He has no wheezes. He has no rhonchi. He has no rales.  Abdominal: Soft. Normal appearance and bowel sounds are normal. He exhibits no distension, no ascites and no mass. There is no hepatosplenomegaly. There is no tenderness. There is no rebound, no guarding and no CVA tenderness.  Musculoskeletal: Normal range of motion. He exhibits edema. He exhibits no tenderness.  BLE edema  Lymphadenopathy:    He has no cervical adenopathy.  Neurological: He is alert and oriented to person, place, and time. He has normal strength. No cranial nerve deficit or sensory deficit.  Skin: Skin is warm, dry and intact. No petechiae and no rash noted. He is not diaphoretic. No erythema. No pallor.  Psychiatric: He has a normal mood and affect. His behavior is normal. Judgment normal.  Nursing note and vitals reviewed.   ED Course  Procedures (including critical care time) DIAGNOSTIC STUDIES: Oxygen Saturation is 100% on room air, normal by my interpretation.    COORDINATION OF CARE: 3:32 AM- Pt advised of plan for treatment and pt agrees. Pt will receive lab work for further evaluation.    Labs Review Labs Reviewed  CBC WITH DIFFERENTIAL/PLATELET - Abnormal; Notable for the following:    RBC 3.73 (*)    Hemoglobin 11.9 (*)    HCT 35.5 (*)    Platelets 125 (*)    All other components within normal limits  COMPREHENSIVE METABOLIC PANEL - Abnormal; Notable for the following:    Glucose, Bld 173 (*)    BUN 26 (*)    Creatinine, Ser 1.66 (*)    Total Protein 6.3 (*)    GFR calc non Af Amer 39 (*)    GFR calc Af Amer 45 (*)    All other components within  normal limits  LIPASE, BLOOD  MAGNESIUM  URINALYSIS, ROUTINE W REFLEX MICROSCOPIC (NOT AT [facility])    Imaging Review Dg Chest 2 View  09/18/2015  CLINICAL DATA:  Nausea.  Presyncope. EXAM: CHEST  2 VIEW COMPARISON:  08/23/2012 FINDINGS: The heart size and mediastinal contours are within normal limits. Both lungs are clear. The visualized skeletal structures are unremarkable. IMPRESSION: No active cardiopulmonary disease. Electronically Signed   By: Andreas Newport M.D.   On: 09/18/2015 04:13     Joshua Balls, MD has personally reviewed and evaluated these images and lab results as part of his medical decision-making.   EKG Interpretation   Date/Time:  Friday September 18 2015 03:37:40 EDT Ventricular Rate:  68 PR Interval:    QRS Duration:  112 QT Interval:  428 QTC Calculation: 456 R Axis:   -20 Text Interpretation:  Sinus rhythm Ventricular premature complex  Incomplete left bundle branch block No significant change since last  tracing Confirmed by Glynn Octave [phone]) on 09/18/2015 4:30:09 AM      MDM   Final diagnoses:  None    Patient presents to the ED for near syncopal episode while in  The ED with his wife who fell.  He states this has happened before and he was admitted without a known cause.  Will obtain labs and give IVF for treatment. Patient also given soda by nursing staff.  EKG does not show cause for syncope.  CXR is unremarkable  4:56 AM Cr slightly elevated.  INR 2.0, doubt PE as a cause.  Patient received IVF and encouraged to increase liquids.  Plan to Dc with PCP fu.  He appears well and in NAD. VS remain within his normal limits and he is safe for DC   I personally performed the services described in this documentation, which was scribed in my presence. The recorded information has been reviewed and is accurate.      Joshua Balls, MD 09/18/15 [phone]

## 2015-10-13 ENCOUNTER — Encounter: Payer: Self-pay | Admitting: Physician Assistant

## 2015-10-13 ENCOUNTER — Ambulatory Visit (INDEPENDENT_AMBULATORY_CARE_PROVIDER_SITE_OTHER): Payer: Medicare Other | Admitting: Physician Assistant

## 2015-10-13 VITALS — BP 150/80 | HR 83 | Temp 97.7°F | Resp 16 | Ht 70.5 in | Wt 220.6 lb

## 2015-10-13 DIAGNOSIS — I824Z9 Acute embolism and thrombosis of unspecified deep veins of unspecified distal lower extremity: Secondary | ICD-10-CM | POA: Diagnosis not present

## 2015-10-13 LAB — CBC WITH DIFFERENTIAL/PLATELET
BASOS PCT: 1 %
Basophils Absolute: 92 cells/uL (ref 0–200)
Eosinophils Absolute: 92 cells/uL (ref 15–500)
Eosinophils Relative: 1 %
HCT: 37.9 % — ABNORMAL LOW (ref 38.5–50.0)
Hemoglobin: 13.1 g/dL — ABNORMAL LOW (ref 13.2–17.1)
LYMPHS PCT: 18 %
Lymphs Abs: 1656 cells/uL (ref 850–3900)
MCH: 31.9 pg (ref 27.0–33.0)
MCHC: 34.6 g/dL (ref 32.0–36.0)
MCV: 92.2 fL (ref 80.0–100.0)
MONO ABS: 460 {cells}/uL (ref 200–950)
MONOS PCT: 5 %
MPV: 8.8 fL (ref 7.5–12.5)
Neutro Abs: 6900 cells/uL (ref 1500–7800)
Neutrophils Relative %: 75 %
PLATELETS: 127 10*3/uL — AB (ref 140–400)
RBC: 4.11 MIL/uL — AB (ref 4.20–5.80)
RDW: 13.8 % (ref 11.0–15.0)
WBC: 9.2 10*3/uL (ref 3.8–10.8)

## 2015-10-13 NOTE — Patient Instructions (Signed)
Adductor Muscle Strain With Rehab The adductor muscles of the thigh are responsible for moving the leg across the body and are susceptible to muscle strains. A strain is an injury to a muscle or a tendon that attaches the muscle to a bone. Strains of the adductor muscles occur where the muscle tendons attach to the pelvic bone. A muscle strain may be a complete or partial tear of the muscle and may involve one or more of the adductor muscles. These strains are usually classified as a grade 1 or 2 strain. A grade 1 strain has no obvious sign of tearing or stretching of the muscle or tendon, but may include significant inflammation. A grade 2 strain is a moderate strain in which the muscle or tendon has been partially torn and has been stretched. Grade 2 strains are usually accompanied with loss of strength. A grade 3 muscle strain rarely occurs in the adductor muscles. A grade 3 strain is a complete tear of the muscle or tendon. SYMPTOMS   Occasionally there is a sudden "pop" felt or heard in the groin or inner thigh at the time of injury.  There may be pain, tenderness, swelling, warmth, or redness over the inner thigh and groin. This may be worsened by moving the hip (especially when spreading the legs or hips, pushing the legs against each other or kicking with the affected leg). There may be bruising (contusion) in the groin and inner thigh within 48 hours following the injury.  There may be loss of fullness of the muscle with complete rupture (uncommon).  Muscle spasm in the groin and inner thigh can occur. CAUSES   Prolonged overuse or a sudden increase in intensity, frequency, or duration of activity.  Single episode of stressful overactivity, such as during kicking.  Single violent blow or force to the inner thigh (less common). RISK INCREASES WITH:  Sports that require repeated kicking (soccer, martial arts, football), as well as sports that require the legs to be brought together  (gymnastics, horseback riding).  Sports that require rapid acceleration (ice hockey, track and field).  Poor strength and flexibility.  Previous thigh injury. PREVENTION   Warm up and stretch properly before activity.  Maintain physical fitness:  Hip and thigh flexibility.  Muscle strength and endurance.  Cardiovascular fitness.  Complete the entire course of rehabilitation after any lower extremity injury. Do this before returning to competition or practice. Follow suggestions of your caregiver. PROGNOSIS  If treated properly, adductor muscle strains usually heal well within 2 to 6 weeks. RELATED COMPLICATIONS   Healing time will be prolonged if the condition is not appropriately treated. It needs adequate time to heal.  Do not return to activity too soon. Recurrence of symptoms and reinjury are possible.  If left untreated, the strain may progress to a complete tear (rare) or other injury caused by limping and favoring the injured leg.  Prolonged disability is possible. TREATMENT Treatment initially involves ice and medication to help reduce pain and inflammation. Strength and stretching exercises are recommended to maintain strength and a full range of motion. Strenuous activities should be modified to prevent further injury. Using crutches for the first few days may help to lessen pain. On rare occasions, surgery is necessary to reattach the tendon to the bone. If pain becomes persistent or chronic after more than 3 months of nonsurgical treatment, surgery may also be recommended. MEDICATION   If pain medication is necessary, nonsteroidal anti-inflammatory medications, such as aspirin and ibuprofen, or other   minor pain relievers, such as acetaminophen, are often recommended.  Do not take pain medication for 7 days before surgery or as advised.  Prescription pain relievers may be given. Use only as directed and only as much as you need.  Ointments applied to the skin may  be helpful.  Corticosteroid injections may be given to reduce inflammation. HEAT AND COLD  Cold treatment (icing) relieves pain and reduces inflammation. Cold treatment should be applied for 10 to 15 minutes every 2 to 3 hours for inflammation and pain and immediately after any activity that aggravates your symptoms. Use ice packs or an ice massage.  Heat treatment may be used prior to performing the stretching and strengthening activities prescribed by your caregiver, physical therapist, or athletic trainer. Use a heat pack or a warm soak. SEEK MEDICAL CARE IF:   Symptoms get worse or do not improve in 2 weeks, despite treatment.  New, unexplained symptoms develop. (Drugs used in treatment may produce side effects.) EXERCISES  RANGE OF MOTION (ROM) AND STRETCHING EXERCISES - Adductor Muscle Strain These exercises may help you when beginning to rehabilitate your injury. Your symptoms may resolve with or without further involvement from your physician, physical therapist, or athletic trainer. While completing these exercises, remember:   Restoring tissue flexibility helps normal motion to return to the joints. This allows healthier, less painful movement and activity.  An effective stretch should be held for at least 30 seconds.  A stretch should never be painful. You should only feel a gentle lengthening or release in the stretched tissue. STRETCH - Adductors, Lunge  While standing, spread your legs.  Lean away from your right / left leg by bending your opposite knee. You may rest your hands on your thigh for balance.  You should feel a stretch in your right / left inner thigh. Hold for __________ seconds. Repeat __________ times. Complete this exercise __________ times per day.  STRETCH - Adductors, Standing  Place your right / left foot on a counter or stable table. Turn away from your leg so both hips line up with your right / left leg.  Keeping your hips facing forward, slowly  bend your opposite leg until you feel a gentle stretch on the inside of your right / left thigh.  Hold for __________ seconds. Repeat __________ times. Complete this exercise __________ times per day.  STRETCH - Hip Adductors, Sitting   Sit on the floor and place the bottoms of your feet together. Keep your chest up and look straight ahead to keep your back in proper alignment. Slide your feet in towards your body as far as you can without rounding your back or increasing any discomfort.  Gently push down on your knees until you feel a gentle stretch in your inner thighs. Hold this position for __________ seconds. Repeat __________ times. Complete this exercise __________ times per day.  STRETCH - Hamstrings/Adductors, V-Sit  Sit on the floor with your legs extended in a large "V," keeping your knees straight.  With your head and chest upright, bend at your waist reaching for your left foot to stretch your right adductors.  You should feel a stretch in your right inner thigh. Hold for __________ seconds.  Return to the upright position to relax your leg muscles.  Continuing to keep your chest upright, bend straight forward at your waist to stretch your hamstrings.  You should feel a stretch behind both of your thighs and/or knees. Hold for __________ seconds.  Return to the  upright position to relax your leg muscles.  Repeat steps 2 through 4 for the right leg to stretch your left inner thigh. Repeat __________ times. Complete this exercise __________ times per day.  STRENGTHENING EXERCISES - Adductor Muscle Strain These exercises may help you when beginning to rehabilitate your injury. They may resolve your symptoms with or without further involvement from your physician, physical therapist, or athletic trainer. While completing these exercises, remember:   Muscles can gain both the endurance and the strength needed for everyday activities through controlled exercises.  Complete  these exercises as instructed by your physician, physical therapist, or athletic trainer. Progress the resistance and repetitions only as guided.  You may experience muscle soreness or fatigue, but the pain or discomfort you are trying to eliminate should never worsen during these exercises. If this pain does worsen, stop and make certain you are following the directions exactly. If the pain is still present after adjustments, discontinue the exercise until you can discuss the trouble with your caregiver. STRENGTH - Hip Adductors, Isometrics   Sit on a firm chair so that your knees are about the same height as your hips.  Place a large ball, firm pillow, or rolled up bath towel between your thighs.  Squeeze your thighs together, gradually building tension. Hold for __________ seconds.  Release the tension gradually and allow your inner thigh muscles to relax completely before repeating the exercise. Repeat __________ times. Complete this exercise __________ times per day.  STRENGTH - Hip Adductors, Straight Leg Raises   Lie on your side so that your head, shoulders, knee and hip line up. You may place your upper foot in front to help maintain your balance. Your right / left leg should be on the bottom.  Roll your hips slightly forward, so that your hips are stacked directly over each other and your right / left knee is facing forward.  Tense the muscles in your inner thigh and lift your bottom leg 4-6 inches. Hold this position for __________ seconds.  Slowly lower your leg to the starting position. Allow the muscles to fully relax before beginning the next repetition. Repeat __________ times. Complete this exercise __________ times per day.    This information is not intended to replace advice given to you by your health care provider. Make sure you discuss any questions you have with your health care provider.   Document Released: 03/07/2005 Document Revised: 03/28/2014 Document Reviewed:  06/19/2008 Elsevier Interactive Patient Education [employer].

## 2015-10-13 NOTE — Progress Notes (Signed)
   Subjective:    Patient ID: Joshua Peterson, male    DOB: 06/02/1940, 75 y.o.   MRN: [id number]  HPI 75 y.o. WM with history of DVT x 2 after long plane rides, neg coag panel. He drove several weekends ago to [place], 5 hr drive did not stop, can back the next day. He complains of right inner thigh pain, very specific medial thigh pain, started July 4th, trip to [place] a week before, intermittent, started to take 3-4 ASA a day. Better with movement, started to wear socks again, worse with sleeping on his side. Denies warmth, swelling, swelling distal legs.. States his wife has been sleeping on the couch with his wife, was sleeping there due to [facility], so he was sleeping on the couch with him.   Blood pressure (!) 150/80, pulse 83, temperature 97.7 F (36.5 C), temperature source Temporal, resp. rate 16, height 5' 10.5" (1.791 m), weight 220 lb 9.6 oz (100.1 kg), SpO2 96 %.  Medications Current Outpatient Prescriptions on File Prior to Visit  Medication Sig  . glucose blood (ONE TOUCH ULTRA TEST) test strip USE AS DIRECTED EVERY DAY-[id number].  . warfarin (COUMADIN) 5 MG tablet Take 5 mg by mouth daily.   No current facility-administered medications on file prior to visit.     Problem list He has DVT of lower extremity (deep venous thrombosis) ([place]); CKD stage 2 due to type 2 diabetes mellitus ([place]); Hypertension; Hyperlipidemia; Vitamin D deficiency; Testosterone deficiency; Obesity; Medication management; BMI 27.0,  adult; and GERD  on his problem list.  Review of Systems  Constitutional: Negative.   HENT: Negative.   Respiratory: Negative.  Negative for shortness of breath.   Cardiovascular: Negative.  Negative for leg swelling.  Gastrointestinal: Negative.   Genitourinary: Negative.   Musculoskeletal: Positive for arthralgias (leg pain).  Skin: Negative.  Negative for rash.  Neurological: Negative.  Negative for dizziness.       Objective:   Physical Exam  Constitutional: He  is oriented to person, place, and time. He appears well-developed and well-nourished.  Cardiovascular: Normal rate and regular rhythm.   Pulmonary/Chest: Effort normal and breath sounds normal.  Abdominal: Soft. Bowel sounds are normal. There is no tenderness.  Musculoskeletal:  Right thigh with pain with internal abduction and pain with palpation along adductor, no distal warmth, swelling, tenderness, good distal vascular exam, negative homen's sign.   Neurological: He is alert and oriented to person, place, and time.  Skin: Skin is warm and dry.      Assessment & Plan:  Deep vein thrombosis (DVT) of distal vein of lower extremity, unspecified chronicity, unspecified laterality ([place]) Will get Ddimer, if negative can cancel [place] Start on coumadin that you have at home and stop ASA Can also be adductor strain, given information, RICE, can start aleve if Ddimer negative.  - CBC with Differential/Platelet - CK - D-dimer, quantitative (not at [facility]) - VAS [place] LOWER EXTREMITY VENOUS (DVT); Future

## 2015-10-14 LAB — CK: Total CK: 225 U/L (ref 7–232)

## 2015-10-14 LAB — D-DIMER, QUANTITATIVE: D-Dimer, Quant: 0.28 mcg/mL FEU (ref <0.50)

## 2015-10-14 NOTE — Addendum Note (Signed)
Addended by: Vicie Mutters R on: 10/14/2015 08:31 AM   Modules accepted: Orders

## 2015-11-26 ENCOUNTER — Ambulatory Visit (INDEPENDENT_AMBULATORY_CARE_PROVIDER_SITE_OTHER): Payer: Medicare Other | Admitting: Physician Assistant

## 2015-11-26 ENCOUNTER — Encounter: Payer: Self-pay | Admitting: Physician Assistant

## 2015-11-26 VITALS — BP 132/90 | HR 74 | Temp 97.7°F | Resp 16 | Ht 70.5 in | Wt 208.2 lb

## 2015-11-26 DIAGNOSIS — E785 Hyperlipidemia, unspecified: Secondary | ICD-10-CM | POA: Diagnosis not present

## 2015-11-26 DIAGNOSIS — Z23 Encounter for immunization: Secondary | ICD-10-CM

## 2015-11-26 DIAGNOSIS — E669 Obesity, unspecified: Secondary | ICD-10-CM

## 2015-11-26 DIAGNOSIS — I824Z9 Acute embolism and thrombosis of unspecified deep veins of unspecified distal lower extremity: Secondary | ICD-10-CM

## 2015-11-26 DIAGNOSIS — I10 Essential (primary) hypertension: Secondary | ICD-10-CM

## 2015-11-26 DIAGNOSIS — E559 Vitamin D deficiency, unspecified: Secondary | ICD-10-CM | POA: Diagnosis not present

## 2015-11-26 DIAGNOSIS — N182 Chronic kidney disease, stage 2 (mild): Secondary | ICD-10-CM

## 2015-11-26 DIAGNOSIS — E1122 Type 2 diabetes mellitus with diabetic chronic kidney disease: Secondary | ICD-10-CM | POA: Diagnosis not present

## 2015-11-26 DIAGNOSIS — Z79899 Other long term (current) drug therapy: Secondary | ICD-10-CM

## 2015-11-26 DIAGNOSIS — E291 Testicular hypofunction: Secondary | ICD-10-CM | POA: Diagnosis not present

## 2015-11-26 DIAGNOSIS — E349 Endocrine disorder, unspecified: Secondary | ICD-10-CM

## 2015-11-26 LAB — CBC WITH DIFFERENTIAL/PLATELET
BASOS PCT: 1 %
Basophils Absolute: 71 cells/uL (ref 0–200)
Eosinophils Absolute: 213 cells/uL (ref 15–500)
Eosinophils Relative: 3 %
HCT: 39.5 % (ref 38.5–50.0)
Hemoglobin: 13.1 g/dL — ABNORMAL LOW (ref 13.2–17.1)
LYMPHS PCT: 24 %
Lymphs Abs: 1704 cells/uL (ref 850–3900)
MCH: 30.8 pg (ref 27.0–33.0)
MCHC: 33.2 g/dL (ref 32.0–36.0)
MCV: 92.7 fL (ref 80.0–100.0)
MONO ABS: 355 {cells}/uL (ref 200–950)
MONOS PCT: 5 %
MPV: 9.1 fL (ref 7.5–12.5)
Neutro Abs: 4757 cells/uL (ref 1500–7800)
Neutrophils Relative %: 67 %
PLATELETS: 127 10*3/uL — AB (ref 140–400)
RBC: 4.26 MIL/uL (ref 4.20–5.80)
RDW: 13.7 % (ref 11.0–15.0)
WBC: 7.1 10*3/uL (ref 3.8–10.8)

## 2015-11-26 LAB — BASIC METABOLIC PANEL WITH GFR
BUN: 24 mg/dL (ref 7–25)
CO2: 20 mmol/L (ref 20–31)
Calcium: 9.5 mg/dL (ref 8.6–10.3)
Chloride: 114 mmol/L — ABNORMAL HIGH (ref 98–110)
Creat: 1.28 mg/dL — ABNORMAL HIGH (ref 0.70–1.18)
GFR, EST AFRICAN AMERICAN: 63 mL/min (ref >=60)
GFR, EST NON AFRICAN AMERICAN: 54 mL/min — AB (ref >=60)
Glucose, Bld: 110 mg/dL — ABNORMAL HIGH (ref 65–99)
POTASSIUM: 3.9 mmol/L (ref 3.5–5.3)
SODIUM: 143 mmol/L (ref 135–146)

## 2015-11-26 LAB — HEPATIC FUNCTION PANEL
ALBUMIN: 4.3 g/dL (ref 3.6–5.1)
ALK PHOS: 77 U/L (ref 40–115)
ALT: 26 U/L (ref 9–46)
AST: 20 U/L (ref 10–35)
BILIRUBIN INDIRECT: 0.5 mg/dL (ref 0.2–1.2)
BILIRUBIN TOTAL: 0.6 mg/dL (ref 0.2–1.2)
Bilirubin, Direct: 0.1 mg/dL (ref <=0.2)
Total Protein: 6.8 g/dL (ref 6.1–8.1)

## 2015-11-26 LAB — LIPID PANEL
Cholesterol: 173 mg/dL (ref 125–200)
HDL: 32 mg/dL — AB (ref >=40)
LDL CALC: 79 mg/dL (ref <130)
TRIGLYCERIDES: 310 mg/dL — AB (ref <150)
Total CHOL/HDL Ratio: 5.4 Ratio — ABNORMAL HIGH (ref <=5.0)
VLDL: 62 mg/dL — AB (ref <30)

## 2015-11-26 LAB — TSH: TSH: 1.45 mIU/L (ref 0.40–4.50)

## 2015-11-26 LAB — MAGNESIUM: Magnesium: 1.9 mg/dL (ref 1.5–2.5)

## 2015-11-26 NOTE — Progress Notes (Signed)
Assessment and Plan:  Hypertension: Continue medication, monitor blood pressure at home. Continue DASH diet.  Reminder to go to the ER if any CP, SOB, nausea, dizziness, severe HA, changes vision/speech, left arm numbness and tingling and jaw pain. Cholesterol: Continue diet and exercise. Check cholesterol.  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 Check A1C Vitamin D Def- check level and continue medications.  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 Further disposition pending results of labs. Discussed med's effects and SE's.  Future Appointments Date Time Provider [place]  02/25/2016 11:15 AM Unk Pinto, MD [facility] None    HPI 75 y.o. male  presents for 3 month follow up with hypertension, hyperlipidemia, diabetes and vitamin D.  His blood pressure has been controlled at home, today their BP is BP: 132/90 He does workout. He denies chest pain, shortness of breath, dizziness.  He is on cholesterol medication, fenofibrate 134 and denies myalgias. His cholesterol is at goal. The cholesterol was:  05/21/2015: Cholesterol 174; HDL 29; LDL Cholesterol 89; Triglycerides 279 He has been working on diet and exercise for Diabetes with diabetic chronic kidney disease, he is on bASA, he is on ACE/ARB, he is on metformin and denies polydipsia, polyuria and visual disturbances. Last A1C was: 05/21/2015: Hgb A1c MFr Bld 5.7 Patient is on Vitamin D supplement. 01/14/2015: Vit D, 25-Hydroxy 35  He has history of recurrent DVTs with travel.   Has history of passing "stones", most recent was 3 days ago, tear dropped shape with some blood after but it is better. Happens every 3-4 months, states he has seen urologist in the past.  BMI is Body mass index is 29.45 kg/m., he is  working on diet and exercise and has lost a significant amount of weight and due to this he is in the preDM range and doing well.  Wt Readings from Last 3 Encounters:  11/26/15 208 lb 3.2 oz (94.4 kg)  10/13/15 220 lb 9.6 oz (100.1 kg)  09/18/15 212 lb (96.2 kg)    Current Medications:  Current Outpatient Prescriptions on File Prior to Visit  Medication Sig Dispense Refill  . aspirin 81 MG tablet Take 81 mg by mouth daily.    Marland Kitchen glucose blood (ONE TOUCH ULTRA TEST) test strip USE AS DIRECTED EVERY DAY-[id number]. 100 each 3   No current facility-administered medications on file prior to visit.    Medical History:  Past Medical History:  Diagnosis Date  . Diabetes mellitus   . DVT (deep venous thrombosis) ([facility])    bilateral legs  . Hx of colonic polyps   . Hyperlipidemia   . Hypertension   . Hypogonadism male   . Obesity   . Vitamin D deficiency    Allergies:  Allergies  Allergen Reactions  . Doxycycline   . Niacin And Related     flushing  . Sulfa Antibiotics Other (See Comments)    Review of Systems:  Review of Systems  Constitutional: Negative.   HENT: Negative.   Eyes: Negative.   Respiratory: Negative.   Cardiovascular: Negative.   Gastrointestinal: Negative.   Genitourinary: Negative.   Musculoskeletal: Negative.   Skin: Negative.   Neurological: Negative.   Endo/Heme/Allergies: Negative.   Psychiatric/Behavioral: Negative.     Family history- Review and unchanged Social history- Review and unchanged Physical Exam: BP  132/90   Pulse 74   Temp 97.7 F (36.5 C)   Resp 16   Ht 5' 10.5" (1.791 m)   Wt 208 lb 3.2 oz (94.4 kg)   SpO2 98%   BMI 29.45 kg/m  Wt Readings from Last 3 Encounters:  11/26/15 208 lb 3.2 oz (94.4 kg)  10/13/15 220 lb 9.6 oz (100.1 kg)  09/18/15 212 lb (96.2 kg)   General Appearance: Well nourished, in no apparent distress. Eyes: PERRLA, EOMs, conjunctiva no swelling or erythema Sinuses: No Frontal/maxillary  tenderness ENT/Mouth: Ext aud canals clear, TMs without erythema, bulging. No erythema, swelling, or exudate on post pharynx.  Tonsils not swollen or erythematous. Hearing normal.  Neck: Supple, thyroid normal.  Respiratory: Respiratory effort normal, BS equal bilaterally without rales, rhonchi, wheezing or stridor.  Cardio: RRR with no MRGs. Brisk peripheral pulses without edema.  Abdomen: Soft, + BS,  Non tender, no guarding, rebound, hernias, masses. Lymphatics: Non tender without lymphadenopathy.  Musculoskeletal: Full ROM, 5/5 strength, normal gait.  Skin: Warm, dry without rashes, lesions, ecchymosis.  Neuro: Cranial nerves intact. No cerebellar symptoms. Sensation intact.  Psych: Awake and oriented X 3, normal affect, Insight and Judgment appropriate.    Vicie Mutters, PA-C 10:49 AM [facility]

## 2015-11-26 NOTE — Patient Instructions (Signed)

## 2015-11-27 ENCOUNTER — Encounter: Payer: Self-pay | Admitting: Physician Assistant

## 2015-11-27 DIAGNOSIS — D696 Thrombocytopenia, unspecified: Secondary | ICD-10-CM | POA: Insufficient documentation

## 2015-11-27 LAB — HEMOGLOBIN A1C
Hgb A1c MFr Bld: 5.4 % (ref <5.7)
Mean Plasma Glucose: 108 mg/dL

## 2016-02-17 ENCOUNTER — Encounter: Payer: Self-pay | Admitting: Internal Medicine

## 2016-02-25 ENCOUNTER — Other Ambulatory Visit: Payer: Self-pay | Admitting: Internal Medicine

## 2016-02-25 ENCOUNTER — Encounter: Payer: Self-pay | Admitting: Internal Medicine

## 2016-02-25 ENCOUNTER — Ambulatory Visit (INDEPENDENT_AMBULATORY_CARE_PROVIDER_SITE_OTHER): Payer: Medicare Other | Admitting: Internal Medicine

## 2016-02-25 VITALS — BP 156/96 | HR 72 | Temp 97.3°F | Resp 16 | Ht 70.25 in | Wt 222.8 lb

## 2016-02-25 DIAGNOSIS — Z0001 Encounter for general adult medical examination with abnormal findings: Secondary | ICD-10-CM

## 2016-02-25 DIAGNOSIS — Z0000 Encounter for general adult medical examination without abnormal findings: Secondary | ICD-10-CM

## 2016-02-25 DIAGNOSIS — Z1212 Encounter for screening for malignant neoplasm of rectum: Secondary | ICD-10-CM

## 2016-02-25 DIAGNOSIS — N182 Chronic kidney disease, stage 2 (mild): Secondary | ICD-10-CM

## 2016-02-25 DIAGNOSIS — E559 Vitamin D deficiency, unspecified: Secondary | ICD-10-CM

## 2016-02-25 DIAGNOSIS — I10 Essential (primary) hypertension: Secondary | ICD-10-CM | POA: Diagnosis not present

## 2016-02-25 DIAGNOSIS — Z136 Encounter for screening for cardiovascular disorders: Secondary | ICD-10-CM

## 2016-02-25 DIAGNOSIS — K219 Gastro-esophageal reflux disease without esophagitis: Secondary | ICD-10-CM

## 2016-02-25 DIAGNOSIS — Z79899 Other long term (current) drug therapy: Secondary | ICD-10-CM

## 2016-02-25 DIAGNOSIS — Z125 Encounter for screening for malignant neoplasm of prostate: Secondary | ICD-10-CM

## 2016-02-25 DIAGNOSIS — E782 Mixed hyperlipidemia: Secondary | ICD-10-CM

## 2016-02-25 DIAGNOSIS — E1122 Type 2 diabetes mellitus with diabetic chronic kidney disease: Secondary | ICD-10-CM

## 2016-02-25 NOTE — Progress Notes (Addendum)
[place] [facility]   Unk Pinto, M.D.    Uvaldo Bristle. Silverio Lay, P.A.-C      Starlyn Skeans, P.A.-C  [facility]                [address] [place], [place] [id number] Telephone [phone] Telefax [phone] Annual  Screening/Preventative Visit  & Comprehensive Evaluation & Examination     This very nice 75 y.o. MWM presents for a Screening/Preventative Visit & comprehensive evaluation and management of multiple medical co-morbidities.  Patient has been followed for HTN, T2_NIDDM, Hyperlipidemia and Vitamin D Deficiency.  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 Also, patient has hx/o GERD , which he reports is controlled with diet.     HTN predates circa 2004 . Patient's BP has been controlled at home.  Today's BP is elevated at 156/96 and rechecked at 165/94. Patient denies any cardiac symptoms as chest pain, palpitations, shortness of breath, dizziness or ankle swelling.     Patient's hypercholesterolemia  is controlled with diet and Triglycerides are elevated as he has stopped his Fenofibrate medications. Patient denies myalgias or other medication SE's. 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 He had been in control on Metformin, but apparently has stopped it for unclear reasons. He admits infrequent CBG's have been in the 180+'s. Last A1c on Metformin was at goal:  Lab Results  Component Value Date   HGBA1C 5.4 11/26/2015       Finally, patient has history of Vitamin D Deficiency in 2008 of "24" and last  vitamin D was still very low: Lab Results  Component Value Date   VD25OH 35 01/14/2015   Medication Sig   Vit D 5,000 units  Takes 1 cap daily   bASA 81 mg  Takes 1 tab daily   . aspirin 81 MG tablet Take 81 mg by mouth daily.   Allergies  Allergen Reactions  . Doxycycline   . Niacin And Related     flushing  . Sulfa Antibiotics Other (See Comments)   Past Medical History:  Diagnosis Date  . Diabetes mellitus   . DVT (deep venous thrombosis) (HCC)    bilateral legs  . Hx of colonic polyps   . Hyperlipidemia   . Hypertension   . Hypogonadism male   . Obesity   . Vitamin D deficiency    Health Maintenance  Topic Date Due  . OPHTHALMOLOGY EXAM  12/10/2015  . FOOT EXAM  01/14/2016  . TETANUS/TDAP  04/02/2016  . HEMOGLOBIN A1C  05/25/2016  . COLONOSCOPY  07/31/2017  . INFLUENZA VACCINE  Completed  . ZOSTAVAX  Completed  . PNA vac Low Risk Adult  Completed   Immunization History  Administered Date(s) Administered  . Influenza, High Dose Seasonal PF 01/14/2015, 11/26/2015  . Pneumococcal Conjugate-13 01/14/2015  . Pneumococcal-Unspecified 04/02/2008  . Td 04/02/2006  . Zoster 04/03/2007   Past Surgical History:  Procedure Laterality Date  . COLONOSCOPY    .  COLONOSCOPY W/ POLYPECTOMY  05/28/2007  . IVC Filter placed and REMOVED (right leg)     temporary  . POLYPECTOMY    . RIGHT CHEEK/MOUTH BIOPSY  09/16/2008  . RIGHT DEEP CERVICAL NODE BIOPSY  01/03/2001  . TONSILLECTOMY     Family History  Problem Relation Age of Onset  . Alzheimer's disease Mother   . Diabetes Mother   . Prostate cancer Father   . Colon cancer Neg Hx   . Rectal cancer Neg Hx   . Stomach cancer Neg Hx   . Breast cancer Sister    Social History   Social History  . Marital status: Married  . Number of children: none   Occupational History  . Retired Restaurant manager, fast food   Social History Main Topics  . Smoking status: Former Smoker    Packs/day: 1.00    Years: 20.00    Types:  Cigarettes    Quit date: 04/16/1991  . Smokeless tobacco: Never Used  . Alcohol use 1.2 oz/week    2 Cans of beer per week     Comment: ocassional (1-2 x a week)  . Drug use: No  . Sexual activity: No    ROS Constitutional: Denies fever, chills, weight loss/gain, headaches, insomnia,  night sweats or change in appetite. Does c/o fatigue. Eyes: Denies redness, blurred vision, diplopia, discharge, itchy or watery eyes.  ENT: Denies discharge, congestion, post nasal drip, epistaxis, sore throat, earache, hearing loss, dental pain, Tinnitus, Vertigo, Sinus pain or snoring.  Cardio: Denies chest pain, palpitations, irregular heartbeat, syncope, dyspnea, diaphoresis, orthopnea, PND, claudication or edema Respiratory: denies cough, dyspnea, DOE, pleurisy, hoarseness, laryngitis or wheezing.  Gastrointestinal: Denies dysphagia, heartburn, reflux, water brash, pain, cramps, nausea, vomiting, bloating, diarrhea, constipation, hematemesis, melena, hematochezia, jaundice or hemorrhoids Genitourinary: Denies dysuria, frequency, urgency, nocturia, hesitancy, discharge, hematuria or flank pain Musculoskeletal: Denies arthralgia, myalgia, stiffness, Jt. Swelling, pain, limp or strain/sprain. Denies Falls. Skin: Denies puritis, rash, hives, warts, acne, eczema or change in skin lesion Neuro: No weakness, tremor, incoordination, spasms, paresthesia or pain Psychiatric: Denies confusion, memory loss or sensory loss. Denies Depression. Endocrine: Denies change in weight, skin, hair change, nocturia, and paresthesia, diabetic polys, visual blurring or hyper / hypo glycemic episodes.  Heme/Lymph: No excessive bleeding, bruising or enlarged lymph nodes.  Physical Exam  BP 156/96->165/94   P 72   T 97.3 F    R 16   Ht 5' 10.25"    Wt 222 lb 12.8 oz    BMI 31.74  General Appearance: Over nourished, in no apparent distress.  Eyes: PERRLA, EOMs, conjunctiva no swelling or erythema, normal fundi and  vessels. Sinuses: No frontal/maxillary tenderness ENT/Mouth: EACs patent / TMs  nl. Nares clear without erythema, swelling, mucoid exudates. Oral hygiene is good. No erythema, swelling, or exudate. Tongue normal, non-obstructing. Tonsils not swollen or erythematous. Hearing normal.  Neck: Supple, thyroid normal. No bruits, nodes or JVD. Respiratory: Respiratory effort normal.  BS equal and clear bilateral without rales, rhonci, wheezing or stridor. Cardio: Heart sounds are normal with regular rate and rhythm and no murmurs, rubs or gallops. Peripheral pulses are normal and equal bilaterally without edema. No aortic or femoral bruits. Chest: symmetric with normal excursions and percussion.  Abdomen: Soft, with Nl bowel sounds. Nontender, no guarding, rebound, hernias, masses, or organomegaly.  Lymphatics: Non tender without lymphadenopathy.  Genitourinary: No hernias.Testes nl. DRE - prostate nl for age - smooth & firm w/o nodules. Musculoskeletal: Full ROM all peripheral extremities, joint stability, 5/5  strength, and normal gait. Skin: Warm and dry without rashes, lesions, cyanosis, clubbing or  ecchymosis.  Neuro: Cranial nerves intact, reflexes equal bilaterally. Normal muscle tone, no cerebellar symptoms. Sensation intact.  Pysch: Alert and oriented X 3 with normal affect, insight and judgment appropriate.   Assessment and Plan  1. Annual Preventative/Screening Exam    2. Essential hypertension  - Rx Losartan 100 mg #90 x 1 rf - take 1/2 to 1 tablet daily  - Microalbumin / creatinine urine ratio - EKG 12-Lead - [place], RETROPERITNL ABD,  LTD - Urinalysis, Routine w reflex microscopic - CBC with Differential/Platelet - BASIC METABOLIC PANEL WITH GFR - TSH  3. Mixed hyperlipidemia  - EKG 12-Lead - [place], RETROPERITNL ABD,  LTD - Hepatic function panel - Lipid panel - TSH  4. CKD stage 2 due to type 2 diabetes mellitus (HCC)  - Microalbumin / creatinine urine ratio - EKG  12-Lead - [place], RETROPERITNL ABD,  LTD - Urinalysis, Routine w reflex microscopic - BASIC METABOLIC PANEL WITH GFR - Hemoglobin A1c - Insulin, random  5. Vitamin D deficiency  - VITAMIN D 25 Hydroxy   6. Gastroesophageal reflux disease   7. Screening for rectal cancer  - POC Hemoccult Bld/Stl   8. Prostate cancer screening  - PSA  9. Screening for ischemic heart disease  - EKG 12-Lead  10. Screening for AAA (aortic abdominal aneurysm)  - [place], RETROPERITNL ABD,  LTD  11. Medication management  - Urinalysis, Routine w reflex microscopic - CBC with Differential/Platelet - BASIC METABOLIC PANEL WITH GFR - Hepatic function panel - Magnesium       Continue prudent diet as discussed, weight control, BP monitoring, regular exercise, and medications as discussed.  Discussed med effects and SE's. Routine screening labs and tests as requested with regular follow-up as recommended. Over 40 minutes of exam, counseling, chart review and high complex critical decision making was performed

## 2016-02-25 NOTE — Patient Instructions (Signed)

## 2016-02-26 ENCOUNTER — Other Ambulatory Visit: Payer: Self-pay | Admitting: *Deleted

## 2016-02-26 LAB — BASIC METABOLIC PANEL WITH GFR
BUN: 29 mg/dL — ABNORMAL HIGH (ref 7–25)
CALCIUM: 9.4 mg/dL (ref 8.6–10.3)
CHLORIDE: 109 mmol/L (ref 98–110)
CO2: 21 mmol/L (ref 20–31)
Creat: 1.49 mg/dL — ABNORMAL HIGH (ref 0.70–1.18)
GFR, EST NON AFRICAN AMERICAN: 45 mL/min — AB (ref >=60)
GFR, Est African American: 52 mL/min — ABNORMAL LOW (ref >=60)
GLUCOSE: 219 mg/dL — AB (ref 65–99)
POTASSIUM: 4.4 mmol/L (ref 3.5–5.3)
SODIUM: 140 mmol/L (ref 135–146)

## 2016-02-26 LAB — CBC WITH DIFFERENTIAL/PLATELET
BASOS ABS: 61 {cells}/uL (ref 0–200)
BASOS PCT: 1 %
EOS ABS: 183 {cells}/uL (ref 15–500)
Eosinophils Relative: 3 %
HCT: 37.8 % — ABNORMAL LOW (ref 38.5–50.0)
HEMOGLOBIN: 12.6 g/dL — AB (ref 13.2–17.1)
LYMPHS ABS: 1647 {cells}/uL (ref 850–3900)
Lymphocytes Relative: 27 %
MCH: 31.3 pg (ref 27.0–33.0)
MCHC: 33.3 g/dL (ref 32.0–36.0)
MCV: 94 fL (ref 80.0–100.0)
MONOS PCT: 4 %
MPV: 9.3 fL (ref 7.5–12.5)
Monocytes Absolute: 244 cells/uL (ref 200–950)
NEUTROS ABS: 3965 {cells}/uL (ref 1500–7800)
Neutrophils Relative %: 65 %
PLATELETS: 118 10*3/uL — AB (ref 140–400)
RBC: 4.02 MIL/uL — ABNORMAL LOW (ref 4.20–5.80)
RDW: 14.3 % (ref 11.0–15.0)
WBC: 6.1 10*3/uL (ref 3.8–10.8)

## 2016-02-26 LAB — MICROALBUMIN, URINE: Microalb, Ur: 5.9 mg/dL

## 2016-02-26 LAB — HEPATIC FUNCTION PANEL
ALBUMIN: 4.4 g/dL (ref 3.6–5.1)
ALT: 26 U/L (ref 9–46)
AST: 20 U/L (ref 10–35)
Alkaline Phosphatase: 69 U/L (ref 40–115)
BILIRUBIN TOTAL: 0.7 mg/dL (ref 0.2–1.2)
Bilirubin, Direct: 0.1 mg/dL (ref <=0.2)
Indirect Bilirubin: 0.6 mg/dL (ref 0.2–1.2)
Total Protein: 6.9 g/dL (ref 6.1–8.1)

## 2016-02-26 LAB — URINALYSIS, ROUTINE W REFLEX MICROSCOPIC
Bilirubin Urine: NEGATIVE
HGB URINE DIPSTICK: NEGATIVE
LEUKOCYTES UA: NEGATIVE
NITRITE: NEGATIVE
PH: 5 (ref 5.0–8.0)
SPECIFIC GRAVITY, URINE: 1.025 (ref 1.001–1.035)

## 2016-02-26 LAB — LIPID PANEL
CHOL/HDL RATIO: 8.6 ratio — AB (ref <5.0)
CHOLESTEROL: 215 mg/dL — AB (ref <200)
HDL: 25 mg/dL — AB (ref >40)
Triglycerides: 651 mg/dL — ABNORMAL HIGH (ref <150)

## 2016-02-26 LAB — URINALYSIS, MICROSCOPIC ONLY
BACTERIA UA: NONE SEEN [HPF]
CASTS: NONE SEEN [LPF]
CRYSTALS: NONE SEEN [HPF]
SQUAMOUS EPITHELIAL / LPF: NONE SEEN [HPF] (ref <=5)
YEAST: NONE SEEN [HPF]

## 2016-02-26 LAB — VITAMIN D 25 HYDROXY (VIT D DEFICIENCY, FRACTURES): Vit D, 25-Hydroxy: 25 ng/mL — ABNORMAL LOW (ref 30–100)

## 2016-02-26 LAB — TSH: TSH: 1.68 m[IU]/L (ref 0.40–4.50)

## 2016-02-26 LAB — HEMOGLOBIN A1C
Hgb A1c MFr Bld: 7 % — ABNORMAL HIGH (ref <5.7)
Mean Plasma Glucose: 154 mg/dL

## 2016-02-26 LAB — PSA: PSA: 0.4 ng/mL (ref <=4.0)

## 2016-02-26 LAB — MAGNESIUM: MAGNESIUM: 1.9 mg/dL (ref 1.5–2.5)

## 2016-02-26 MED ORDER — LOSARTAN POTASSIUM 100 MG PO TABS: ORAL_TABLET | ORAL | 1 refills | Status: DC

## 2016-02-27 ENCOUNTER — Encounter: Payer: Self-pay | Admitting: Internal Medicine

## 2016-03-02 ENCOUNTER — Other Ambulatory Visit: Payer: Self-pay | Admitting: *Deleted

## 2016-03-02 ENCOUNTER — Telehealth: Payer: Self-pay | Admitting: *Deleted

## 2016-03-02 MED ORDER — METFORMIN HCL ER 500 MG PO TB24: ORAL_TABLET | ORAL | 1 refills | Status: DC

## 2016-03-02 NOTE — Telephone Encounter (Signed)
Pt aware of lab results and an RX for Metformin 500 mg XR sent to the patient's pharmacy per Dr Melford Aase. Patient was instructed to increase his Vitamim D to 10000 units daily.

## 2016-03-08 ENCOUNTER — Other Ambulatory Visit: Payer: Self-pay | Admitting: Physician Assistant

## 2016-03-30 ENCOUNTER — Ambulatory Visit (INDEPENDENT_AMBULATORY_CARE_PROVIDER_SITE_OTHER): Payer: Medicare Other | Admitting: Internal Medicine

## 2016-03-30 VITALS — BP 138/66 | HR 94 | Temp 98.2°F | Resp 18 | Ht 70.25 in | Wt 228.0 lb

## 2016-03-30 DIAGNOSIS — J069 Acute upper respiratory infection, unspecified: Secondary | ICD-10-CM | POA: Diagnosis not present

## 2016-03-30 MED ORDER — FLUTICASONE PROPIONATE 50 MCG/ACT NA SUSP: 2.0000 | Freq: Every day | NASAL | 0 refills | Status: DC

## 2016-03-30 MED ORDER — PROMETHAZINE-DM 6.25-15 MG/5ML PO SYRP: ORAL_SOLUTION | ORAL | 1 refills | Status: DC

## 2016-03-30 MED ORDER — PREDNISONE 20 MG PO TABS: ORAL_TABLET | ORAL | 0 refills | Status: DC

## 2016-03-30 MED ORDER — AZITHROMYCIN 250 MG PO TABS: ORAL_TABLET | ORAL | 0 refills | Status: DC

## 2016-03-30 NOTE — Progress Notes (Signed)
HPI  Patient presents to the office for evaluation of cough.  It has been going on for 1 weeks.  Patient reports night > day, wet, barky, worse with lying down.  They also endorse change in voice, chills, postnasal drip and yellow nasal sputum, mild nose bleeds, some fatigue.  .  They have tried tylenol cold and flu.  They report that nothing has worked.  They denies other sick contacts.  Review of Systems  Constitutional: Positive for malaise/fatigue. Negative for chills and fever.  HENT: Positive for congestion, ear pain, hearing loss and sore throat.   Respiratory: Positive for cough. Negative for sputum production, shortness of breath and wheezing.   Cardiovascular: Negative for chest pain, palpitations and leg swelling.  Neurological: Positive for headaches.    PE:  Vitals:   03/30/16 1056  BP: 138/66  Pulse: 94  Resp: 18  Temp: 98.2 F (36.8 C)    General:  Alert and non-toxic, WDWN, NAD HEENT: NCAT, PERLA, EOM normal, no occular discharge or erythema.  Nasal mucosal edema with sinus tenderness to palpation.  Oropharynx clear with minimal oropharyngeal edema and erythema.  Mucous membranes moist and pink. Neck:  Cervical adenopathy Chest:  RRR no MRGs.  Lungs clear to auscultation A&P with no wheezes rhonchi or rales.   Abdomen: +BS x 4 quadrants, soft, non-tender, no guarding, rigidity, or rebound. Skin: warm and dry no rash Neuro: A&Ox4, CN II-XII grossly intact  Assessment and Plan:   1. Acute URI -prednisone -zpak -flonase -daily antihistamine -nasal saline -phenergan dm

## 2016-04-07 ENCOUNTER — Ambulatory Visit: Payer: Self-pay | Admitting: Internal Medicine

## 2016-04-21 ENCOUNTER — Ambulatory Visit (INDEPENDENT_AMBULATORY_CARE_PROVIDER_SITE_OTHER): Payer: Medicare Other | Admitting: Internal Medicine

## 2016-04-21 ENCOUNTER — Encounter: Payer: Self-pay | Admitting: Internal Medicine

## 2016-04-21 VITALS — BP 128/70 | HR 76 | Temp 97.3°F | Resp 16 | Ht 70.25 in | Wt 220.8 lb

## 2016-04-21 DIAGNOSIS — N182 Chronic kidney disease, stage 2 (mild): Secondary | ICD-10-CM

## 2016-04-21 DIAGNOSIS — E1122 Type 2 diabetes mellitus with diabetic chronic kidney disease: Secondary | ICD-10-CM | POA: Diagnosis not present

## 2016-04-21 DIAGNOSIS — R632 Polyphagia: Secondary | ICD-10-CM | POA: Diagnosis not present

## 2016-04-21 MED ORDER — PHENTERMINE HCL 37.5 MG PO TABS: ORAL_TABLET | ORAL | 5 refills | Status: DC

## 2016-04-21 NOTE — Patient Instructions (Signed)

## 2016-04-21 NOTE — Progress Notes (Signed)
  Subjective:    Patient ID: Joshua Peterson, male    DOB: 04/19/1940, 76 y.o.   MRN: [id number]  HPI  Patient returns for 1 month f/u (from Dec 7th)  with A1c jumping from 5.4% up to 7.0% with patient having for unknown reasons having stopped his Metformin and now restarted since that discovery. He does admit his recent overeating during the holidays and also was seen about a week ago for a respiratory infection tx'd with a Zpak and prednisone, so his CBG's have been elevated  Medication Sig  . aspirin 81 MG tablet Take 81 mg by mouth daily.  Marland Kitchen FLONASE nasal spray Place 2 sprays into both nostrils daily.  Marland Kitchen losartan  100 MG tablet Take 1/2 to 1 tablet by mouth daily for BP.  . metFORMIN-XR) 500 MG Take 1 tablet by mouth 2 times daily with the largest meals.  Marland Kitchen PROMETHAZINE-DM 6.25-15 syrup Take 5-10 ML PO q8hrs prn for cough and congestion  . Z-PAK 2 po day one, then 1 daily x 4 days  . predniSONE  20 MG tablet 3 tabs po daily x 3 days, then 2 tabs x 3 days, then 1.5 tabs x 3 days, then 1 tab x 3 days, then 0.5 tabs x 3 days   Allergies  Allergen Reactions  . Doxycycline   . Niacin And Related     flushing  . Sulfa Antibiotics Other (See Comments)   Past Medical History:  Diagnosis Date  . Diabetes mellitus   . DVT (deep venous thrombosis) (HCC)    bilateral legs  . Hx of colonic polyps   . Hyperlipidemia   . Hypertension   . Hypogonadism male   . Obesity   . Vitamin D deficiency    Past Surgical History:  Procedure Laterality Date  . COLONOSCOPY    . COLONOSCOPY W/ POLYPECTOMY  05/28/2007  . IVC Filter placed and REMOVED (right leg)     temporary  . POLYPECTOMY    . RIGHT CHEEK/MOUTH BIOPSY  09/16/2008  . RIGHT DEEP CERVICAL NODE BIOPSY  01/03/2001  . TONSILLECTOMY     Review of Systems  10 point systems review negative except as above.    Objective:   Physical Exam  BP 128/70   Pulse 76   Temp 97.3 F (36.3 C)   Resp 16   Ht 5' 10.25" (1.784 m)   Wt 220 lb 12.8 oz  (100.2 kg)   BMI 31.46 kg/m   HEENT - Eac's patent. TM's Nl. EOM's full. PERRLA. NasoOroPharynx clear. Neck - supple. Nl Thyroid. Carotids 2+ & No bruits, nodes, JVD Chest - Clear equal BS w/o Rales, rhonchi, wheezes. Cor - Nl HS. RRR w/o sig MGR. PP 1(+). No edema. MS- FROM w/o deformities. Muscle power, tone and bulk Nl. Gait Nl. Neuro - No obvious Cr N abnormalities. Sensory, motor and Cerebellar functions appear Nl w/o focal abnormalities.    Assessment & Plan:   1. CKD stage 2 due to type 2 diabetes mellitus ([place])  - educated in prudent diabetic diet.  - recommended to take Cinnamon 1,000 mg 2 x/day   - recommended to increase Metformin 500 mg ER from 3 to 4 tabs/day  2. Gluttony  - phentermine (ADIPEX-P) 37.5 MG tablet; Take 1/2 to 1 tablet every morning for dieting & weight loss  Dispense: 30 tablet; Refill: 5

## 2016-05-04 ENCOUNTER — Other Ambulatory Visit: Payer: Self-pay | Admitting: *Deleted

## 2016-05-04 MED ORDER — METFORMIN HCL ER 500 MG PO TB24: ORAL_TABLET | ORAL | 1 refills | Status: DC

## 2016-06-06 ENCOUNTER — Ambulatory Visit (INDEPENDENT_AMBULATORY_CARE_PROVIDER_SITE_OTHER): Payer: Medicare Other | Admitting: Internal Medicine

## 2016-06-06 ENCOUNTER — Encounter: Payer: Self-pay | Admitting: Internal Medicine

## 2016-06-06 VITALS — BP 124/62 | HR 74 | Temp 98.0°F | Resp 16 | Ht 70.25 in | Wt 220.0 lb

## 2016-06-06 DIAGNOSIS — K219 Gastro-esophageal reflux disease without esophagitis: Secondary | ICD-10-CM

## 2016-06-06 DIAGNOSIS — E349 Endocrine disorder, unspecified: Secondary | ICD-10-CM

## 2016-06-06 DIAGNOSIS — R310 Gross hematuria: Secondary | ICD-10-CM | POA: Diagnosis not present

## 2016-06-06 DIAGNOSIS — D696 Thrombocytopenia, unspecified: Secondary | ICD-10-CM | POA: Diagnosis not present

## 2016-06-06 DIAGNOSIS — E559 Vitamin D deficiency, unspecified: Secondary | ICD-10-CM | POA: Diagnosis not present

## 2016-06-06 DIAGNOSIS — Z0001 Encounter for general adult medical examination with abnormal findings: Secondary | ICD-10-CM | POA: Diagnosis not present

## 2016-06-06 DIAGNOSIS — I824Z9 Acute embolism and thrombosis of unspecified deep veins of unspecified distal lower extremity: Secondary | ICD-10-CM

## 2016-06-06 DIAGNOSIS — R6889 Other general symptoms and signs: Secondary | ICD-10-CM

## 2016-06-06 DIAGNOSIS — I10 Essential (primary) hypertension: Secondary | ICD-10-CM | POA: Diagnosis not present

## 2016-06-06 DIAGNOSIS — E1122 Type 2 diabetes mellitus with diabetic chronic kidney disease: Secondary | ICD-10-CM

## 2016-06-06 DIAGNOSIS — N182 Chronic kidney disease, stage 2 (mild): Secondary | ICD-10-CM

## 2016-06-06 DIAGNOSIS — J309 Allergic rhinitis, unspecified: Secondary | ICD-10-CM

## 2016-06-06 DIAGNOSIS — Z6831 Body mass index (BMI) 31.0-31.9, adult: Secondary | ICD-10-CM

## 2016-06-06 DIAGNOSIS — E1165 Type 2 diabetes mellitus with hyperglycemia: Secondary | ICD-10-CM

## 2016-06-06 DIAGNOSIS — Z79899 Other long term (current) drug therapy: Secondary | ICD-10-CM | POA: Diagnosis not present

## 2016-06-06 DIAGNOSIS — E782 Mixed hyperlipidemia: Secondary | ICD-10-CM | POA: Diagnosis not present

## 2016-06-06 DIAGNOSIS — H00015 Hordeolum externum left lower eyelid: Secondary | ICD-10-CM

## 2016-06-06 DIAGNOSIS — E6609 Other obesity due to excess calories: Secondary | ICD-10-CM

## 2016-06-06 DIAGNOSIS — Z0000 Encounter for general adult medical examination without abnormal findings: Secondary | ICD-10-CM

## 2016-06-06 LAB — URINALYSIS, ROUTINE W REFLEX MICROSCOPIC
BILIRUBIN URINE: NEGATIVE
Glucose, UA: NEGATIVE
KETONES UR: NEGATIVE
Leukocytes, UA: NEGATIVE
NITRITE: NEGATIVE
Specific Gravity, Urine: 1.012 (ref 1.001–1.035)
pH: 5 (ref 5.0–8.0)

## 2016-06-06 LAB — CBC WITH DIFFERENTIAL/PLATELET
Basophils Absolute: 65 cells/uL (ref 0–200)
Basophils Relative: 1 %
EOS PCT: 3 %
Eosinophils Absolute: 195 cells/uL (ref 15–500)
HCT: 35.6 % — ABNORMAL LOW (ref 38.5–50.0)
HEMOGLOBIN: 12.2 g/dL — AB (ref 13.2–17.1)
LYMPHS ABS: 1560 {cells}/uL (ref 850–3900)
LYMPHS PCT: 24 %
MCH: 32.2 pg (ref 27.0–33.0)
MCHC: 34.3 g/dL (ref 32.0–36.0)
MCV: 93.9 fL (ref 80.0–100.0)
MPV: 8.9 fL (ref 7.5–12.5)
Monocytes Absolute: 325 cells/uL (ref 200–950)
Monocytes Relative: 5 %
NEUTROS PCT: 67 %
Neutro Abs: 4355 cells/uL (ref 1500–7800)
PLATELETS: 141 10*3/uL (ref 140–400)
RBC: 3.79 MIL/uL — AB (ref 4.20–5.80)
RDW: 14.2 % (ref 11.0–15.0)
WBC: 6.5 10*3/uL (ref 3.8–10.8)

## 2016-06-06 LAB — BASIC METABOLIC PANEL WITH GFR
BUN: 26 mg/dL — ABNORMAL HIGH (ref 7–25)
CHLORIDE: 106 mmol/L (ref 98–110)
CO2: 23 mmol/L (ref 20–31)
Calcium: 9.7 mg/dL (ref 8.6–10.3)
Creat: 1.34 mg/dL — ABNORMAL HIGH (ref 0.70–1.18)
GFR, EST NON AFRICAN AMERICAN: 51 mL/min — AB (ref >=60)
GFR, Est African American: 59 mL/min — ABNORMAL LOW (ref >=60)
Glucose, Bld: 129 mg/dL — ABNORMAL HIGH (ref 65–99)
POTASSIUM: 4.3 mmol/L (ref 3.5–5.3)
SODIUM: 138 mmol/L (ref 135–146)

## 2016-06-06 LAB — HEPATIC FUNCTION PANEL
ALT: 23 U/L (ref 9–46)
AST: 18 U/L (ref 10–35)
Albumin: 4.4 g/dL (ref 3.6–5.1)
Alkaline Phosphatase: 49 U/L (ref 40–115)
BILIRUBIN DIRECT: 0.1 mg/dL (ref <=0.2)
BILIRUBIN TOTAL: 0.8 mg/dL (ref 0.2–1.2)
Indirect Bilirubin: 0.7 mg/dL (ref 0.2–1.2)
Total Protein: 6.7 g/dL (ref 6.1–8.1)

## 2016-06-06 LAB — URINALYSIS, MICROSCOPIC ONLY
BACTERIA UA: NONE SEEN [HPF]
Casts: NONE SEEN [LPF]
YEAST: NONE SEEN [HPF]

## 2016-06-06 LAB — LIPID PANEL
Cholesterol: 155 mg/dL (ref <200)
HDL: 25 mg/dL — AB (ref >40)
LDL CALC: 71 mg/dL (ref <100)
TRIGLYCERIDES: 297 mg/dL — AB (ref <150)
Total CHOL/HDL Ratio: 6.2 Ratio — ABNORMAL HIGH (ref <5.0)
VLDL: 59 mg/dL — AB (ref <30)

## 2016-06-06 LAB — TSH: TSH: 1.22 mIU/L (ref 0.40–4.50)

## 2016-06-06 MED ORDER — FLUTICASONE PROPIONATE 50 MCG/ACT NA SUSP: 2.0000 | Freq: Every day | NASAL | 3 refills | Status: DC

## 2016-06-06 MED ORDER — ERYTHROMYCIN 5 MG/GM OP OINT: TOPICAL_OINTMENT | Freq: Three times a day (TID) | OPHTHALMIC | 0 refills | Status: AC

## 2016-06-06 MED ORDER — TAMSULOSIN HCL 0.4 MG PO CAPS: 0.4000 mg | ORAL_CAPSULE | Freq: Every day | ORAL | 3 refills | Status: DC

## 2016-06-06 NOTE — Progress Notes (Signed)
MEDICARE ANNUAL WELLNESS VISIT AND FOLLOW UP Assessment:    1. Gross hematuria -history sounds most consistent with nephrolithiasis -will likely get CT abdomen and pelvis as he has not had one recently for futher information as to how many stones are present -he was told to stop drinking tea -start flomax for better dilation and easier passing of stones - Urinalysis, Routine w reflex microscopic - Culture, Urine  2. Mixed hyperlipidemia -not at goal -needs statin therapy -pateint has declined this in the past - Lipid panel  3. Type 2 diabetes mellitus with hyperglycemia, without long-term current use of insulin (HCC) -cont metformin -diet and exercise recommended -cont to check CBGs - Hemoglobin A1c  4. Essential hypertension -at goal -on ARB -cont dash diet -exercise as tolerated -monitor at home - TSH  5. Deep vein thrombosis (DVT) of distal vein of lower extremity, unspecified chronicity, unspecified laterality (HCC) -remote -cont daily baby asa  6. Gastroesophageal reflux disease, esophagitis presence not specified -not currently on medication -ranitidine prn  7. Medication management  - CBC with Differential/Platelet - Hepatic function panel - BASIC METABOLIC PANEL WITH GFR  8. CKD stage 2 due to type 2 diabetes mellitus (HCC) -cont metformin -cont to monitor CBG -A1c < 6.5  9. Class 1 obesity due to excess calories with serious comorbidity and body mass index (BMI) of 31.0 to 31.9 in adult -diet and exercise recommended  10. Testosterone deficiency -not currently on therapy -with history of DVT would not recommend therapy as this puts him at higher risk of erytherocytosis  11. Thrombocytopenia ([place]) -monitor CBC  12. Vitamin D deficiency -cont Vit D supplement  13.  Allergic rhinitis -flonase refilled -cont daily antihistamine  14.  Stye -romycin ointment -warm compresses       Over 30 minutes of exam, counseling, chart review, and  critical decision making was performed  Future Appointments Date Time Provider [place]  09/08/2016 2:30 PM Unk Pinto, MD [facility] None  03/31/2017 10:00 AM Unk Pinto, MD [facility] None     Plan:   During the course of the visit the patient was educated and counseled about appropriate screening and preventive services including:    Pneumococcal vaccine   Influenza vaccine  Prevnar 13  Td vaccine  Screening electrocardiogram  Colorectal cancer screening  Diabetes screening  Glaucoma screening  Nutrition counseling    Subjective:  Joshua Peterson is a 76 y.o. male who presents for Medicare Annual Wellness Visit and 3 month follow up for HTN, hyperlipidemia, diabetes, and vitamin D Def.   His blood pressure has been controlled at home, today their BP is BP: 124/62 He does not workout. He denies chest pain, shortness of breath, dizziness.    He is on cholesterol medication and denies myalgias. His cholesterol is not at goal. The cholesterol last visit was:   Lab Results  Component Value Date   CHOL 215 (H) 02/25/2016   HDL 25 (L) 02/25/2016   LDLCALC NOT CALC 02/25/2016   TRIG 651 (H) 02/25/2016   CHOLHDL 8.6 (H) 02/25/2016      Lab Results  Component Value Date   HGBA1C 7.0 (H) 02/25/2016   Last GFR Lab Results  Component Value Date   GFRNONAA 45 (L) 02/25/2016     Lab Results  Component Value Date   GFRAA 52 (L) 02/25/2016   Patient is on Vitamin D supplement.   Lab Results  Component Value Date   VD25OH 25 (L) 02/25/2016     Patient  reports that he has a history of kidney stones.  He reports that he has been passing stones again and he reports that he has been having some blood in his urine and has been having some more issues with urgency and frequency.  He reports that when he has to go if he doesn't he may have an accident.  He reports that he is drinking a lot of water.  He has seen a urologist in the past.    He is finding  that he is having relief from his allergies with his flonase.    He reports that has been having some shortness of breath going up stairs.  He reports that he is rushing to get up the stairs.  He is not doing any physical activity.    He has had a stye on his left lower lid now for a week.  It has improved some.  No drainage.  No vision changes.   Medication Review: Current Outpatient Prescriptions on File Prior to Visit  Medication Sig Dispense Refill  . aspirin 81 MG tablet Take 81 mg by mouth daily.    Marland Kitchen losartan (COZAAR) 100 MG tablet Take 1/2 to 1 tablet by mouth daily for BP. 90 tablet 1  . metFORMIN (GLUCOPHAGE-XR) 500 MG 24 hr tablet Take 2 tablet by mouth 2 times daily with the largest meals. 360 tablet 1  . ONE TOUCH ULTRA TEST test strip USE AS DIRECTED EVERY DAY-[id number]. 100 each 2  . phentermine (ADIPEX-P) 37.5 MG tablet Take 1/2 to 1 tablet every morning for dieting & weightloss 30 tablet 5   No current facility-administered medications on file prior to visit.     Allergies: Allergies  Allergen Reactions  . Doxycycline   . Niacin And Related     flushing  . Sulfa Antibiotics Other (See Comments)    Current Problems (verified) has DVT of lower extremity (deep venous thrombosis) ([place]); CKD stage 2 due to type 2 diabetes mellitus ([place]); Hypertension; Hyperlipidemia; Vitamin D deficiency; Testosterone deficiency; Obesity; Medication management; BMI 27.0,  adult; GERD ; and Thrombocytopenia ([place]) on his problem list.  Screening Tests Immunization History  Administered Date(s) Administered  . Influenza, High Dose Seasonal PF 01/14/2015, 11/26/2015  . Pneumococcal Conjugate-13 01/14/2015  . Pneumococcal-Unspecified 04/02/2008  . Td 04/02/2006  . Zoster 04/03/2007    Preventative care: Last colonoscopy: 2014 Declines TDAP due to cost  Names of Other Physician/Practitioners you currently use: 1. [facility] here for primary  care 2. Dr. Delman Cheadle, eye doctor, last visit 2016 3. Dr. Mariea Clonts, dentist, last visit 2017 Patient Care Team: Unk Pinto, MD as PCP - General (Internal Medicine)  Surgical: He  has a past surgical history that includes IVC Filter placed and REMOVED (right leg); RIGHT DEEP CERVICAL NODE BIOPSY (01/03/2001); Colonoscopy w/ polypectomy (05/28/2007); RIGHT CHEEK/MOUTH BIOPSY (09/16/2008); Colonoscopy; Polypectomy; and Tonsillectomy. Family His family history includes Alzheimer's disease in his mother; Breast cancer in his sister; Diabetes in his mother; Prostate cancer in his father. Social history  He reports that he quit smoking about 25 years ago. His smoking use included Cigarettes. He has a 20.00 pack-year smoking history. He has never used smokeless tobacco. He reports that he drinks about 1.2 oz of alcohol per week . He reports that he does not use drugs.  MEDICARE WELLNESS OBJECTIVES: Physical activity: Current Exercise Habits: The patient does not participate in regular exercise at present Cardiac risk factors: Cardiac Risk Factors include: advanced age (>53men, >  39 women);diabetes mellitus;dyslipidemia;hypertension;male gender Depression/mood screen:   Depression screen [facility] 2/9 06/06/2016  Decreased Interest 0  Down, Depressed, Hopeless 0  PHQ - 2 Score 0    ADLs:  In your present state of health, do you have any difficulty performing the following activities: 06/06/2016 02/27/2016  Hearing? N N  Vision? N N  Difficulty concentrating or making decisions? N N  Walking or climbing stairs? N N  Dressing or bathing? N N  Doing errands, shopping? N N  Preparing Food and eating ? N -  Using the Toilet? N -  In the past six months, have you accidently leaked urine? N -  Do you have problems with loss of bowel control? N -  Managing your Medications? N -  Managing your Finances? N -  Housekeeping or managing your Housekeeping? N -  Some recent data might be hidden     Cognitive  Testing  Alert? Yes  Normal Appearance?Yes  Oriented to person? Yes  Place? Yes   Time? Yes  Recall of three objects?  Yes  Can perform simple calculations? Yes  Displays appropriate judgment?Yes  Can read the correct time from a watch face?Yes  EOL planning: Does Patient Have a Medical Advance Directive?: Yes Type of Advance Directive: Healthcare Power of Attorney, Living will Copy of [place] in Chart?: No - copy requested   Objective:   Today's Vitals   06/06/16 1106  BP: 124/62  Pulse: 74  Resp: 16  Temp: 98 F (36.7 C)  TempSrc: Temporal  Weight: 220 lb (99.8 kg)  Height: 5' 10.25" (1.784 m)   Body mass index is 31.34 kg/m.  Wt Readings from Last 3 Encounters:  06/06/16 220 lb (99.8 kg)  04/21/16 220 lb 12.8 oz (100.2 kg)  03/30/16 228 lb (103.4 kg)     General appearance: alert, no distress, WD/WN, male obese HEENT: normocephalic, sclerae anicteric, TMs pearly, nares patent, no discharge or erythema, pharynx normal.  Left eye with external hordeoleum.  No active drainage or discharge.  No conjunctival injections Oral cavity: MMM, no lesions Neck: supple, no lymphadenopathy, no thyromegaly, no masses Heart: RRR, normal S1, S2, no murmurs Lungs: CTA bilaterally, no wheezes, rhonchi, or rales Abdomen: +bs, soft, non tender, non distended, no masses, no hepatomegaly, no splenomegaly Musculoskeletal: nontender, no swelling, no obvious deformity Extremities: no edema, no cyanosis, no clubbing Pulses: 2+ symmetric, upper and lower extremities, normal cap refill Neurological: alert, oriented x 3, CN2-12 intact, strength normal upper extremities and lower extremities, sensation normal throughout, DTRs 2+ throughout, no cerebellar signs, gait normal Psychiatric: normal affect, behavior normal, pleasant   Medicare Attestation I have personally reviewed: The patient's medical and social history Their use of alcohol, tobacco or illicit drugs Their  current medications and supplements The patient's functional ability including ADLs,fall risks, home safety risks, cognitive, and hearing and visual impairment Diet and physical activities Evidence for depression or mood disorders  The patient's weight, height, BMI, and visual acuity have been recorded in the chart.  I have made referrals, counseling, and provided education to the patient based on review of the above and I have provided the patient with a written personalized care plan for preventive services.     Starlyn Skeans, PA-C   06/06/2016

## 2016-06-07 ENCOUNTER — Other Ambulatory Visit: Payer: Self-pay | Admitting: Internal Medicine

## 2016-06-07 DIAGNOSIS — N200 Calculus of kidney: Secondary | ICD-10-CM

## 2016-06-07 LAB — HEMOGLOBIN A1C
Hgb A1c MFr Bld: 6 % — ABNORMAL HIGH (ref <5.7)
Mean Plasma Glucose: 126 mg/dL

## 2016-06-07 LAB — URINE CULTURE: Organism ID, Bacteria: NO GROWTH

## 2016-06-08 ENCOUNTER — Encounter (INDEPENDENT_AMBULATORY_CARE_PROVIDER_SITE_OTHER): Payer: Self-pay

## 2016-06-15 ENCOUNTER — Telehealth: Payer: Self-pay | Admitting: Internal Medicine

## 2016-06-15 NOTE — Telephone Encounter (Signed)
Patient called, stated maybe passing a stone, he is not in pain, just worked out vigerously, now may be passing stone. He is going to call [employer] to schedule previously ordered CT AB Pel w/o in Epic. [phone] opt1

## 2016-06-20 ENCOUNTER — Ambulatory Visit
Admission: RE | Admit: 2016-06-20 | Discharge: 2016-06-20 | Disposition: A | Discharge status: Home / Self Care | Payer: Medicare Other | Source: Ambulatory Visit | Attending: Internal Medicine | Admitting: Internal Medicine

## 2016-06-20 ENCOUNTER — Other Ambulatory Visit: Payer: Self-pay | Admitting: Internal Medicine

## 2016-06-20 DIAGNOSIS — N200 Calculus of kidney: Secondary | ICD-10-CM

## 2016-06-20 DIAGNOSIS — K8020 Calculus of gallbladder without cholecystitis without obstruction: Secondary | ICD-10-CM

## 2016-06-20 DIAGNOSIS — I723 Aneurysm of iliac artery: Secondary | ICD-10-CM

## 2016-06-20 DIAGNOSIS — N210 Calculus in bladder: Secondary | ICD-10-CM | POA: Insufficient documentation

## 2016-06-20 IMAGING — CT CT ABD-PELV W/O CM
1 of 2 series · 14 of 32 positions shown, 18 images · non-contrast
Comparison: None.

CLINICAL DATA: 75-year-old diabetic hypertensive male with episode
of pain during urination. Microscopic hematuria. Initial encounter.

EXAM:
CT ABDOMEN AND PELVIS WITHOUT CONTRAST
TECHNIQUE: Multidetector CT imaging of the abdomen and pelvis was performed
following the standard protocol without IV contrast.

[Series 2: renal standard/full · axial · 0.90mm/px · z∈[-496,-26]mm · 14 of 104 slices shown, 18 images]
[im 5/104  soft-tissue]
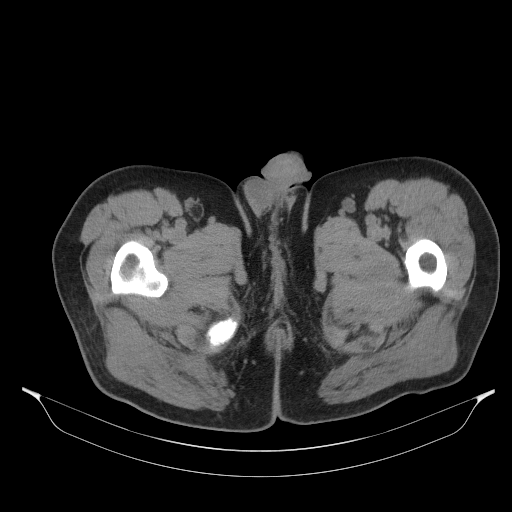
[im 5/104  bone]
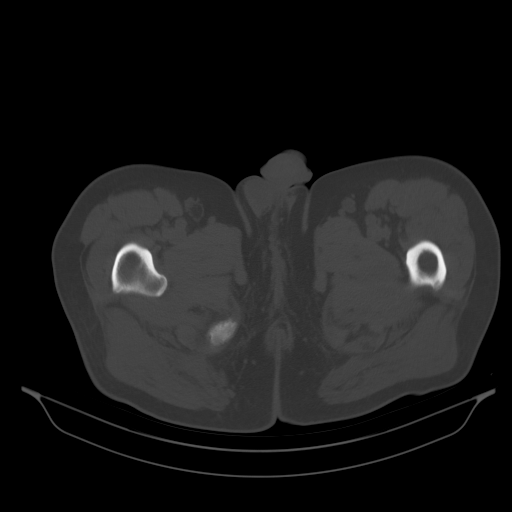
[im 13/104  soft-tissue]
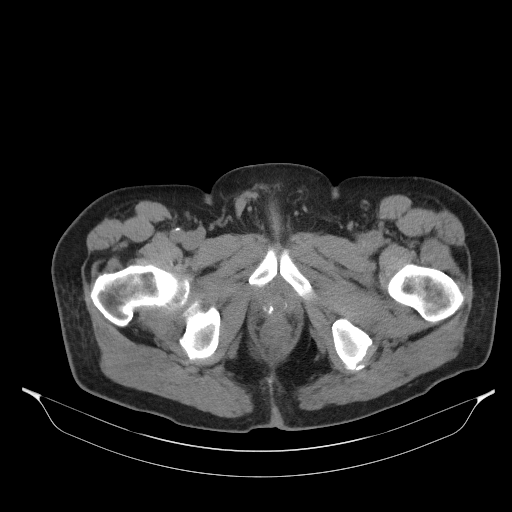
[im 22/104  soft-tissue]
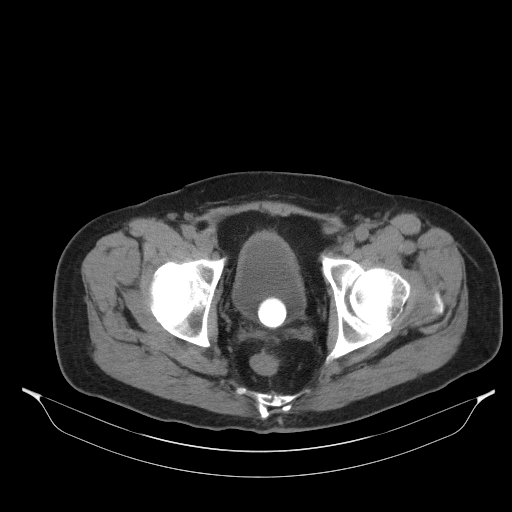
[im 31/104  soft-tissue]
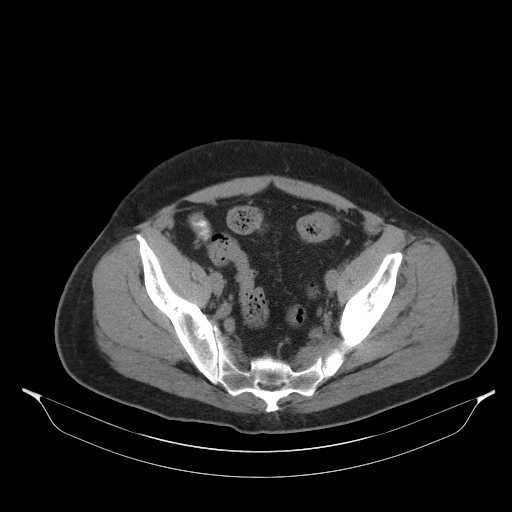
[im 39/104  soft-tissue]
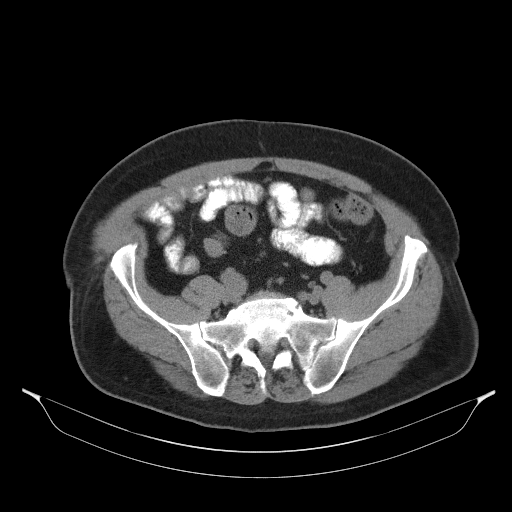
[im 48/104  soft-tissue]
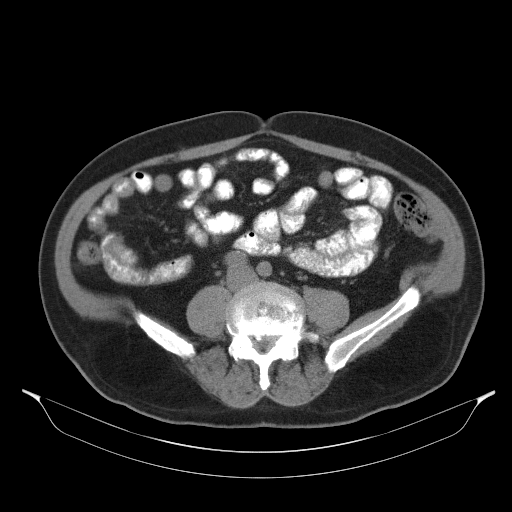
[im 56/104  soft-tissue]
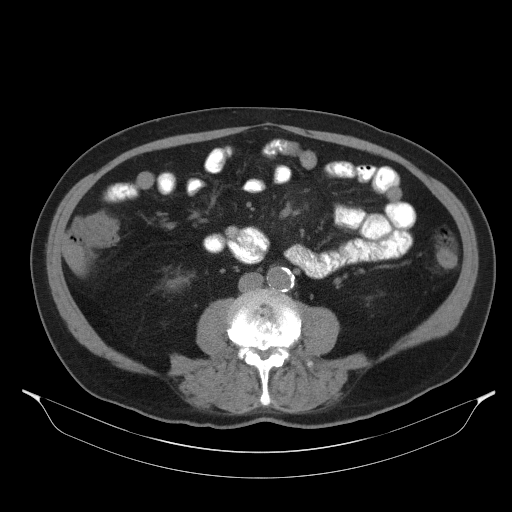
[im 65/104  soft-tissue]
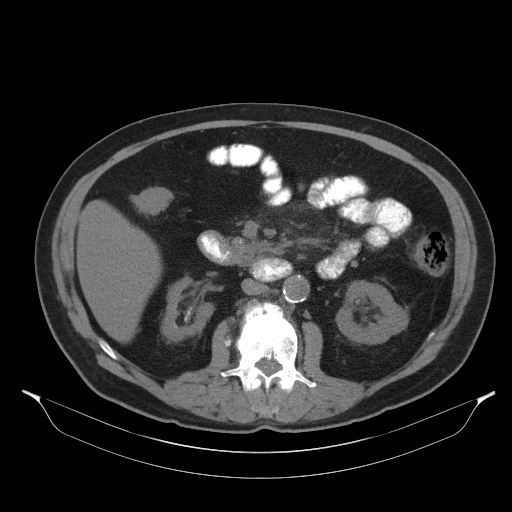
[im 73/104  soft-tissue]
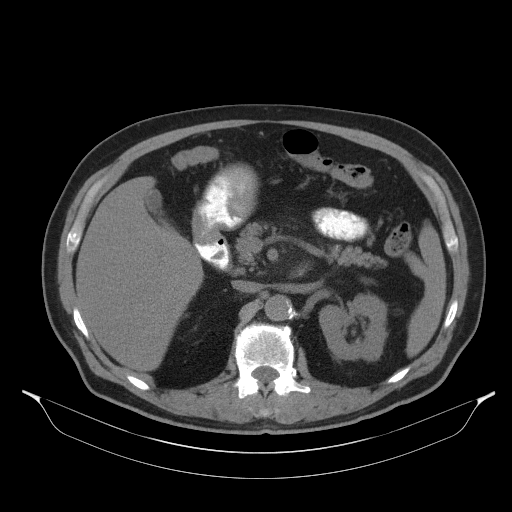
[im 73/104  bone]
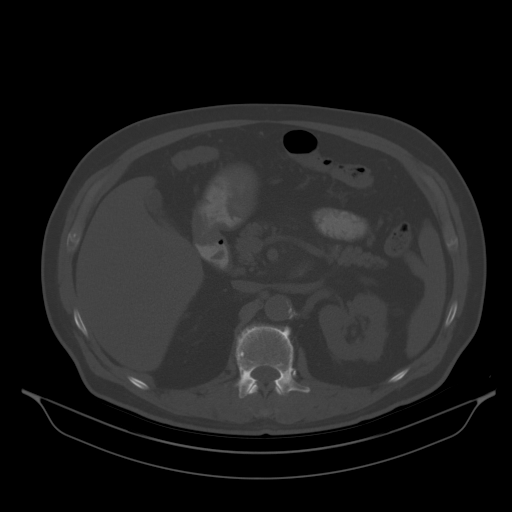
[im 82/104  soft-tissue]
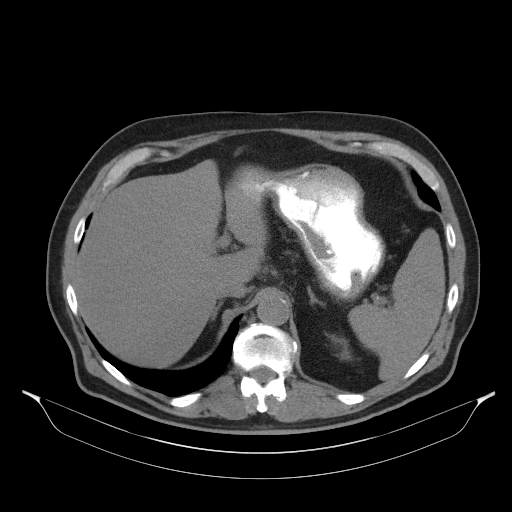
[im 86/104  lung]
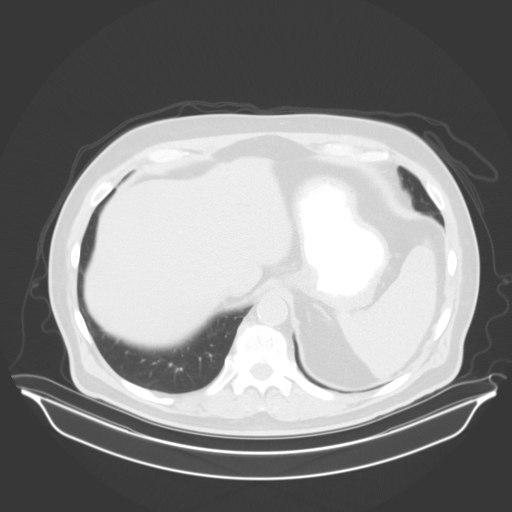
[im 91/104  soft-tissue]
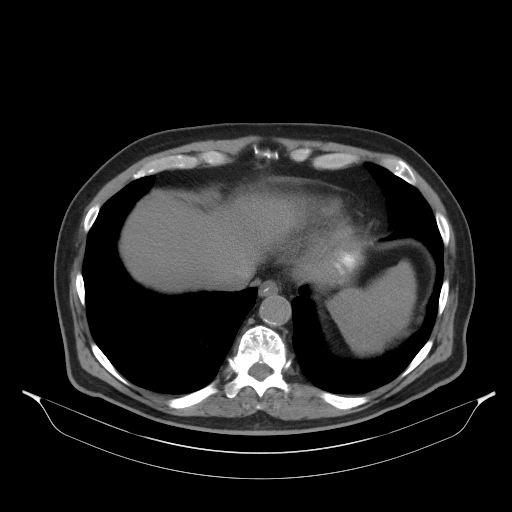
[im 91/104  lung]
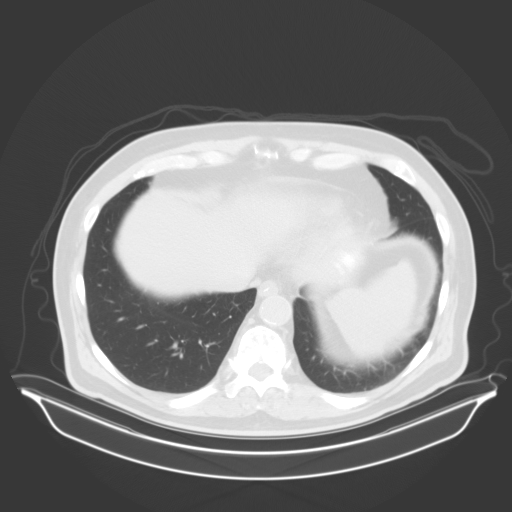
[im 95/104  lung]
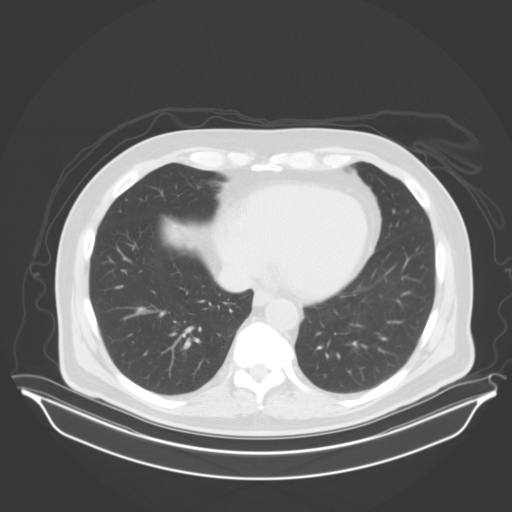
[im 99/104  soft-tissue]
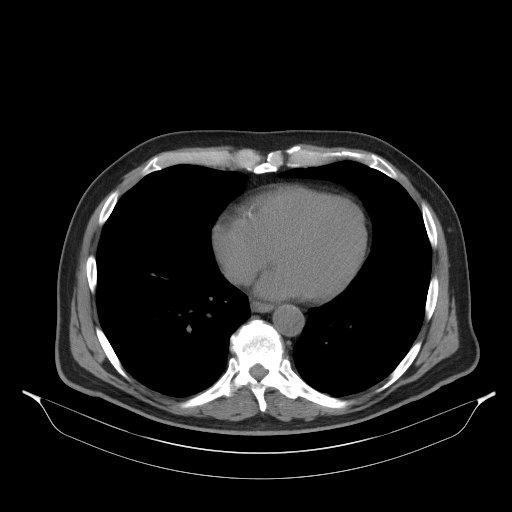
[im 99/104  lung]
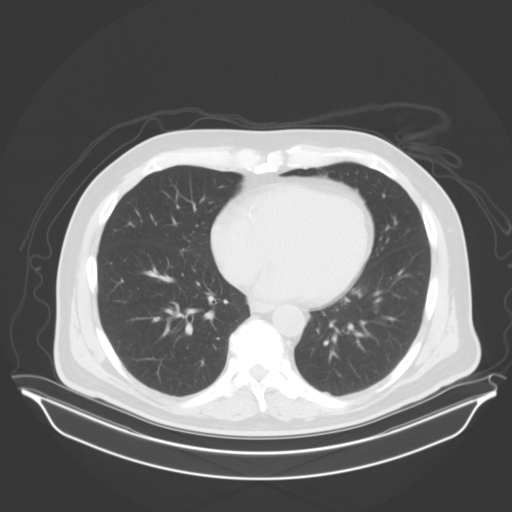

[14 of 32 positions shown; findings below may reference images not displayed]

FINDINGS: Lower chest: Tiny granulomas lung bases. Suggestion of coronary
artery calcifications. Mitral valve calcification. Heart size within
normal limits.

Hepatobiliary: Fatty slightly enlarged liver. Taking into account
limitation by non contrast imaging, no worrisome hepatic mass.

Two calcified gallstones.

Pancreas: Taking into account limitation by non contrast imaging, no
obvious pancreatic mass. Calcifications throughout the pancreas some
which may be vascular in origin.

Spleen: Taking into account limitation by non contrast imaging, no
mass or enlargement.

Adrenals/Urinary Tract: 9 mm nodule inferior aspect left adrenal
gland may represent a small adenoma.

Atrophic right kidney with several nonobstructing calculi
(configuration of early staghorn calculus).

No left renal collecting system renal or ureteral obstructing stone
or evidence hydronephrosis. Slightly lobulated contour of the
inferolateral aspect of the left kidney which may be normal for this
patient although limited for excluding mass without contrast. This
can be assessed with contrast-enhanced CT, MR or ultrasound.

Large bladder stone measures up to 3 cm. Bladder partially
decompressed.

Stomach/Bowel: Mild reflux distal esophagus which is partially
collapsed. Partial collapse gastric fundus. This limits evaluation
at this level although no gross abnormality noted.

Long segment of colon under distended without surrounding
inflammatory process.

Mild hazy infiltration of small bowel mesentery may be long-standing
and incidental. No discrete small bowel abnormality noted.

Vascular/Lymphatic: Atherosclerotic changes aorta with mild ectasia.
Atherosclerotic changes iliac arteries with ectasia. Dilated distal
right common carotid artery just above the bifurcation measuring up
to 2 cm.

Scattered small mesenteric and retroperitoneal lymph nodes. Small
lymph nodes peripancreatic and portacaval region.

Reproductive: Partial calcification prostate gland.

Other: No free air or abnormal fluid collection. No bowel containing
hernia. Prominent vessel in the right inguinal canal.

Musculoskeletal: Scoliosis/ kyphosis with prominent degenerative
changes lumbar spine L1-2 through L5-S1. Degenerative changes lower
thoracic spine most notable T10-11.
IMPRESSION: Large bladder stone measures up to 3 cm.

Atrophic right kidney with several nonobstructing calculi
(configuration of early staghorn calculus).

Slightly lobulated contour of the inferolateral aspect of the left
kidney which may be normal for this patient although limited for
excluding mass without contrast. This can be assessed with
contrast-enhanced CT, MR or ultrasound.

Fatty enlarged liver.

Gallstones.

Mild hazy infiltration of small bowel mesentery may be long-standing
and incidental.

Aortic atherosclerosis.

Dilated distal right common iliac artery just above the bifurcation
measuring up to 2 cm.

Probable 9 mm left adrenal adenoma.

Scoliosis/kyphosis lumbar spine with superimposed degenerative
changes.

## 2016-07-04 LAB — HM DIABETES EYE EXAM

## 2016-07-05 ENCOUNTER — Other Ambulatory Visit: Payer: Self-pay | Admitting: *Deleted

## 2016-07-05 MED ORDER — TAMSULOSIN HCL 0.4 MG PO CAPS: 0.4000 mg | ORAL_CAPSULE | Freq: Every day | ORAL | 3 refills | Status: DC

## 2016-07-06 ENCOUNTER — Other Ambulatory Visit: Payer: Self-pay | Admitting: Urology

## 2016-07-08 ENCOUNTER — Encounter: Payer: Self-pay | Admitting: Surgery

## 2016-07-11 ENCOUNTER — Encounter (HOSPITAL_BASED_OUTPATIENT_CLINIC_OR_DEPARTMENT_OTHER): Payer: Self-pay | Admitting: *Deleted

## 2016-07-11 NOTE — Progress Notes (Signed)
NPO AFTER MN.  ARRIVE AT 0800.  NEEDS ISTAT .   CURRENT EKG IN CHART AND EPIC.  WILL TAKE COZAAR AM DOS W/ SIPS OF WATER.

## 2016-07-17 NOTE — H&P (Signed)
HPI: Joshua Peterson is a 76 year-old male who presents today for cystolitholapaxy for a large bladder stone.  He first noticed the symptoms approximately 06/22/2016. He did see the blood in his urine. He has seen blood clots.   He does have a burning sensation when he urinates. He is currently having trouble urinating.   He has had kidney stones. He is not having pain. He has not recently had unwanted weight loss.   He reports that he has been trying to lose weight and has been walking. He finds that after he is done walking and he urinates he will seen grossly bloody urine. He has no pain. He says he never passed a kidney stone but he said many many years ago after he became dehydrated he passed some small stones and was told to increase his fluid intake and that went away. He has no significant voiding symptoms currently but is taking Flomax which he started about a month ago and has seen improvement in his urination. He says he still gets up 3-4 times at night. He denies any flank pain.     CC: I have bladder stones.  HPI: His problem began approximately 06/22/2016. His symptoms include blood in urine. Patient denies having flank pain, back pain, groin pain, nausea, vomiting, fever, and chills.   He has not had prior urinary tract or prostate infections.   He does not have urgency. He does not have frequency. He does have trouble emptying his bladder at this time. He does not have pain or burning when he urinates.   He has not previously had an indwelling catheter in for more than two weeks at a time. He has had no prostate surgery.   On his CT scan done on 06/20/16 for gross hematuria a 3 cm bladder stone with Hounsfield units of 700 was identified.     CC: I have kidney stones.  HPI: The problem is on the right side. He has had no symptoms. He is not currently having flank pain, back pain, groin pain, nausea, vomiting, fever or chills. He has not caught a stone in his urine strainer since his  symptoms began.   He has never had surgical treatment for calculi in the past.   CT scan 06/20/16 revealed several stones within the right kidney were peripherally located and not associated with any evidence of obstruction. He had fairly significant atrophy of the right kidney.     ALLERGIES: None   MEDICATIONS: Metformin Hcl 500 mg tablet  Tamsulosin Hcl 0.4 mg capsule, ext release 24 hr  Bayer Asp  Cinnamon  Losartan Potassium 100 mg tablet  Multivitamin  Phentermine Hcl 37.5 mg capsule  Vitamin D3     GU PSH: None   NON-GU PSH: None   GU PMH: None   NON-GU PMH: Diabetes Type 2 DVT, History GERD Hypercholesterolemia Hypertension    FAMILY HISTORY: Prostate Cancer - Father   SOCIAL HISTORY: Marital Status: Married Current Smoking Status: Patient does not smoke anymore. Smoked for 10 years.   Tobacco Use Assessment Completed: Used Tobacco in last 30 days? Drinks 2 caffeinated drinks per day.    REVIEW OF SYSTEMS:    GU Review Male:   Patient denies frequent urination, hard to postpone urination, burning/ pain with urination, get up at night to urinate, leakage of urine, stream starts and stops, trouble starting your stream, have to strain to urinate , erection problems, and penile pain.  Gastrointestinal (Upper):   Patient denies nausea,  vomiting, and indigestion/ heartburn.  Gastrointestinal (Lower):   Patient reports diarrhea. Patient denies constipation.  Constitutional:   Patient reports weight loss and fatigue. Patient denies fever and night sweats.  Skin:   Patient denies skin rash/ lesion and itching.  Eyes:   Patient denies blurred vision and double vision.  Ears/ Nose/ Throat:   Patient denies sore throat and sinus problems.  Hematologic/Lymphatic:   Patient denies swollen glands and easy bruising.  Cardiovascular:   Patient denies leg swelling and chest pains.  Respiratory:   Patient reports shortness of breath and cough.   Endocrine:   Patient reports  excessive thirst.   Musculoskeletal:   Patient reports back pain. Patient denies joint pain.  Neurological:   Patient reports dizziness. Patient denies headaches.  Psychologic:   Patient denies depression and anxiety.   VITAL SIGNS:  On Height 71 in / 180.34 cm   GU PHYSICAL EXAMINATION:    Anus and Perineum: No hemorrhoids. No anal stenosis. No rectal fissure, no anal fissure. No edema, no dimple, no perineal tenderness, no anal tenderness.  Scrotum: No lesions. No edema. No cysts. No warts.  Epididymides: Right: no spermatocele, no masses, no cysts, no tenderness, no induration, no enlargement. Left: no spermatocele, no masses, no cysts, no tenderness, no induration, no enlargement.  Testes: No tenderness, no swelling, no enlargement left testes. No tenderness, no swelling, no enlargement right testes. Normal location left testes. Normal location right testes. No mass, no cyst, no varicocele, no hydrocele left testes. No mass, no cyst, no varicocele, no hydrocele right testes.  Urethral Meatus: Normal size. No lesion, no wart, no discharge, no polyp. Normal location.  Penis: Circumcised, no warts, no cracks. No dorsal Peyronie's plaques, no left corporal Peyronie's plaques, no right corporal Peyronie's plaques, no scarring, no warts. No balanitis, no meatal stenosis.  Prostate: Prostate 2 1/2+ size. Left lobe normal consistency, right lobe normal consistency. Symmetrical lobes. No prostate nodule. Left lobe no tenderness, right lobe no tenderness.   Seminal Vesicles: Nonpalpable.  Sphincter Tone: Normal sphincter. No rectal tenderness. No rectal mass.    MULTI-SYSTEM PHYSICAL EXAMINATION:    Constitutional: Well-nourished. No physical deformities. Normally developed. Good grooming.  Neck: Neck symmetrical, not swollen. Normal tracheal position.  Respiratory: No labored breathing, no use of accessory muscles.   Cardiovascular: Normal temperature, normal extremity pulses, no swelling, no  varicosities.  Lymphatic: No enlargement of neck, axillae, groin.  Skin: No paleness, no jaundice, no cyanosis. No lesion, no ulcer, no rash.  Neurologic / Psychiatric: Oriented to time, oriented to place, oriented to person. No depression, no anxiety, no agitation.  Gastrointestinal: No mass, no tenderness, no rigidity, non obese abdomen.  Eyes: Normal conjunctivae. Normal eyelids.  Ears, Nose, Mouth, and Throat: Left ear no scars, no lesions, no masses. Right ear no scars, no lesions, no masses. Nose no scars, no lesions, no masses. Normal hearing. Normal lips.  Musculoskeletal: Normal gait and station of head and neck.     PAST DATA REVIEWED:  Source Of History:  Patient  Lab Test Review:   PSA, BUN/Creatinine  Records Review:   Previous Doctor Records, POC Tool  Urine Test Review:   Urine Culture  X-Ray Review: C.T. Abdomen/Pelvis: Reviewed Films. Reviewed Report. Discussed With Patient. In addition to the above-noted findings he also had an incidental 9 mm left adrenal adenoma identified.   Notes:                     On  06/06/16 his creatinine was found to be 1.36. A urine culture was negative. PSA in 12/17 was normal at 0.4.   PROCEDURES:          Urinalysis w/Scope Dipstick Dipstick Cont'd Micro  Color: Yellow Bilirubin: Neg WBC/hpf: 0 - 5/hpf  Appearance: Clear Ketones: Neg RBC/hpf: 3 - 10/hpf  Specific Gravity: 1.020 Blood: 1+ Bacteria: NS (Not Seen)  pH: <=5.0 Protein: Trace Cystals: NS (Not Seen)  Glucose: Trace Urobilinogen: 0.2 Casts: Granular    Nitrites: Neg Trichomonas: Not Present    Leukocyte Esterase: Neg Mucous: Present      Epithelial Cells: 0 - 5/hpf      Yeast: NS (Not Seen)      Sperm: Not Present    ASSESSMENT/PLAN:     ICD-10 Details  1 GU:   Gross hematuria - R31.0 His gross hematuria is clearly secondary to his large bladder calculus noted on CT scan.  2   Bladder Stone - N21.0 He has a 3 cm bladder stone that is symptomatic and requires therapy. We  discussed the procedure cystolitholapaxy in detail including how the procedure will be performed, the risks and complications, the probability of success, the outpatient nature of the procedure as well as the anticipated postoperative course.  3   Kidney Stone - N20.0 His right renal calculi are nonobstructing, not causing pain and not the source of his hematuria. Observation only is indicated.  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

## 2016-07-18 ENCOUNTER — Ambulatory Visit (HOSPITAL_BASED_OUTPATIENT_CLINIC_OR_DEPARTMENT_OTHER): Payer: Medicare Other | Admitting: Anesthesiology

## 2016-07-18 ENCOUNTER — Encounter (HOSPITAL_BASED_OUTPATIENT_CLINIC_OR_DEPARTMENT_OTHER): Payer: Self-pay | Admitting: *Deleted

## 2016-07-18 ENCOUNTER — Encounter (HOSPITAL_BASED_OUTPATIENT_CLINIC_OR_DEPARTMENT_OTHER)
Admission: RE | Disposition: A | Discharge status: Home / Self Care | Payer: Self-pay | Source: Ambulatory Visit | Attending: Urology

## 2016-07-18 ENCOUNTER — Ambulatory Visit (HOSPITAL_BASED_OUTPATIENT_CLINIC_OR_DEPARTMENT_OTHER)
Admission: RE | Admit: 2016-07-18 | Discharge: 2016-07-18 | Disposition: A | Discharge status: Home / Self Care | Payer: Medicare Other | Source: Ambulatory Visit | Attending: Urology | Admitting: Urology

## 2016-07-18 ENCOUNTER — Encounter: Payer: Medicare Other | Admitting: Surgery

## 2016-07-18 DIAGNOSIS — K219 Gastro-esophageal reflux disease without esophagitis: Secondary | ICD-10-CM | POA: Diagnosis not present

## 2016-07-18 DIAGNOSIS — E119 Type 2 diabetes mellitus without complications: Secondary | ICD-10-CM | POA: Diagnosis not present

## 2016-07-18 DIAGNOSIS — Z8042 Family history of malignant neoplasm of prostate: Secondary | ICD-10-CM | POA: Diagnosis not present

## 2016-07-18 DIAGNOSIS — Z7984 Long term (current) use of oral hypoglycemic drugs: Secondary | ICD-10-CM | POA: Insufficient documentation

## 2016-07-18 DIAGNOSIS — Z86718 Personal history of other venous thrombosis and embolism: Secondary | ICD-10-CM | POA: Insufficient documentation

## 2016-07-18 DIAGNOSIS — I10 Essential (primary) hypertension: Secondary | ICD-10-CM | POA: Insufficient documentation

## 2016-07-18 DIAGNOSIS — Z7982 Long term (current) use of aspirin: Secondary | ICD-10-CM | POA: Insufficient documentation

## 2016-07-18 DIAGNOSIS — N210 Calculus in bladder: Secondary | ICD-10-CM | POA: Diagnosis present

## 2016-07-18 DIAGNOSIS — E7800 Pure hypercholesterolemia, unspecified: Secondary | ICD-10-CM | POA: Insufficient documentation

## 2016-07-18 DIAGNOSIS — N3081 Other cystitis with hematuria: Secondary | ICD-10-CM | POA: Insufficient documentation

## 2016-07-18 DIAGNOSIS — Z87891 Personal history of nicotine dependence: Secondary | ICD-10-CM | POA: Diagnosis not present

## 2016-07-18 HISTORY — DX: Chronic kidney disease, stage 2 (mild): N18.2

## 2016-07-18 HISTORY — DX: Unspecified symptoms and signs involving the genitourinary system: R39.9

## 2016-07-18 HISTORY — DX: Presence of spectacles and contact lenses: Z97.3

## 2016-07-18 HISTORY — DX: Personal history of adenomatous and serrated colon polyps: Z86.0101

## 2016-07-18 HISTORY — DX: Type 2 diabetes mellitus without complications: E11.9

## 2016-07-18 HISTORY — DX: Calculus in bladder: N21.0

## 2016-07-18 HISTORY — DX: Personal history of pulmonary embolism: Z86.711

## 2016-07-18 HISTORY — DX: Occlusion and stenosis of bilateral carotid arteries: I65.23

## 2016-07-18 HISTORY — DX: Personal history of colonic polyps: Z86.010

## 2016-07-18 HISTORY — DX: Personal history of other venous thrombosis and embolism: Z86.718

## 2016-07-18 HISTORY — DX: Mixed hyperlipidemia: E78.2

## 2016-07-18 HISTORY — DX: Allergic rhinitis, unspecified: J30.9

## 2016-07-18 HISTORY — DX: Gross hematuria: R31.0

## 2016-07-18 HISTORY — PX: CYSTOSCOPY WITH LITHOLAPAXY: SHX1425

## 2016-07-18 HISTORY — DX: Occlusion and stenosis of unspecified carotid artery: I65.29

## 2016-07-18 LAB — POCT I-STAT, CHEM 8
BUN: 27 mg/dL — ABNORMAL HIGH (ref 6–20)
Calcium, Ion: 1.25 mmol/L (ref 1.15–1.40)
Chloride: 111 mmol/L (ref 101–111)
Creatinine, Ser: 1.5 mg/dL — ABNORMAL HIGH (ref 0.61–1.24)
Glucose, Bld: 157 mg/dL — ABNORMAL HIGH (ref 65–99)
HCT: 39 % (ref 39.0–52.0)
Hemoglobin: 13.3 g/dL (ref 13.0–17.0)
Potassium: 4.3 mmol/L (ref 3.5–5.1)
Sodium: 142 mmol/L (ref 135–145)
TCO2: 21 mmol/L (ref 0–100)

## 2016-07-18 LAB — GLUCOSE, CAPILLARY: Glucose-Capillary: 140 mg/dL — ABNORMAL HIGH (ref 65–99)

## 2016-07-18 SURGERY — CYSTOSCOPY, WITH BLADDER CALCULUS LITHOLAPAXY
Anesthesia: General | Site: Bladder

## 2016-07-18 MED ORDER — DEXAMETHASONE SODIUM PHOSPHATE 10 MG/ML IJ SOLN
INTRAMUSCULAR | Status: AC
  Filled 2016-07-18: qty 1

## 2016-07-18 MED ORDER — HYDROMORPHONE HCL 1 MG/ML IJ SOLN
0.2500 mg | INTRAMUSCULAR | Status: DC | PRN
  Filled 2016-07-18: qty 0.5

## 2016-07-18 MED ORDER — PHENAZOPYRIDINE HCL 100 MG PO TABS
ORAL_TABLET | ORAL | Status: AC
  Filled 2016-07-18: qty 2

## 2016-07-18 MED ORDER — LACTATED RINGERS IV SOLN
INTRAVENOUS | Status: DC
  Administered 2016-07-18: 09:00:00 via INTRAVENOUS
  Filled 2016-07-18: qty 1000

## 2016-07-18 MED ORDER — FENTANYL CITRATE (PF) 100 MCG/2ML IJ SOLN
INTRAMUSCULAR | Status: AC
  Filled 2016-07-18: qty 2

## 2016-07-18 MED ORDER — TRAMADOL HCL 50 MG PO TABS: 50.0000 mg | ORAL_TABLET | Freq: Four times a day (QID) | ORAL | 0 refills | Status: DC | PRN

## 2016-07-18 MED ORDER — PHENAZOPYRIDINE HCL 200 MG PO TABS: 200.0000 mg | ORAL_TABLET | Freq: Three times a day (TID) | ORAL | 0 refills | Status: DC | PRN

## 2016-07-18 MED ORDER — PROMETHAZINE HCL 25 MG/ML IJ SOLN
6.2500 mg | INTRAMUSCULAR | Status: DC | PRN
  Filled 2016-07-18: qty 1

## 2016-07-18 MED ORDER — PHENAZOPYRIDINE HCL 200 MG PO TABS
200.0000 mg | ORAL_TABLET | Freq: Three times a day (TID) | ORAL | Status: DC
  Administered 2016-07-18: 200 mg via ORAL
  Filled 2016-07-18: qty 1

## 2016-07-18 MED ORDER — DEXAMETHASONE SODIUM PHOSPHATE 4 MG/ML IJ SOLN
INTRAMUSCULAR | Status: DC | PRN
  Administered 2016-07-18: 8 mg via INTRAVENOUS

## 2016-07-18 MED ORDER — LIDOCAINE 2% (20 MG/ML) 5 ML SYRINGE
INTRAMUSCULAR | Status: AC
  Filled 2016-07-18: qty 5

## 2016-07-18 MED ORDER — CIPROFLOXACIN IN D5W 200 MG/100ML IV SOLN
INTRAVENOUS | Status: AC
  Filled 2016-07-18: qty 100

## 2016-07-18 MED ORDER — SODIUM CHLORIDE 0.9 % IR SOLN
Status: DC | PRN
  Administered 2016-07-18: 3000 mL via INTRAVESICAL
  Administered 2016-07-18: 6000 mL via INTRAVESICAL

## 2016-07-18 MED ORDER — CIPROFLOXACIN IN D5W 200 MG/100ML IV SOLN
200.0000 mg | INTRAVENOUS | Status: AC
  Administered 2016-07-18: 200 mg via INTRAVENOUS
  Filled 2016-07-18: qty 100

## 2016-07-18 MED ORDER — FENTANYL CITRATE (PF) 100 MCG/2ML IJ SOLN
INTRAMUSCULAR | Status: DC | PRN
  Administered 2016-07-18 (×2): 50 ug via INTRAVENOUS
  Administered 2016-07-18 (×2): 25 ug via INTRAVENOUS

## 2016-07-18 MED ORDER — PROPOFOL 10 MG/ML IV BOLUS
INTRAVENOUS | Status: DC | PRN
  Administered 2016-07-18: 150 mg via INTRAVENOUS

## 2016-07-18 MED ORDER — ONDANSETRON HCL 4 MG/2ML IJ SOLN
INTRAMUSCULAR | Status: AC
  Filled 2016-07-18: qty 2

## 2016-07-18 MED ORDER — LIDOCAINE 2% (20 MG/ML) 5 ML SYRINGE
INTRAMUSCULAR | Status: DC | PRN
  Administered 2016-07-18: 100 mg via INTRAVENOUS

## 2016-07-18 MED ORDER — PROPOFOL 10 MG/ML IV BOLUS
INTRAVENOUS | Status: AC
  Filled 2016-07-18: qty 20

## 2016-07-18 MED ORDER — ONDANSETRON HCL 4 MG/2ML IJ SOLN
INTRAMUSCULAR | Status: DC | PRN
  Administered 2016-07-18: 4 mg via INTRAVENOUS

## 2016-07-18 MED ORDER — STERILE WATER FOR IRRIGATION IR SOLN
Status: DC | PRN
  Administered 2016-07-18: 3000 mL via INTRAVESICAL

## 2016-07-18 SURGICAL SUPPLY — 20 items
BAG DRAIN URO-CYSTO SKYTR STRL (DRAIN) IMPLANT
CATH INTERMIT  6FR 70CM (CATHETERS) IMPLANT
CATH URET 5FR 28IN CONE TIP (BALLOONS)
CATH URET 5FR 70CM CONE TIP (BALLOONS) IMPLANT
CLOTH BEACON ORANGE TIMEOUT ST (SAFETY) ×2 IMPLANT
FIBER LASER FLEXIVA 1000 (UROLOGICAL SUPPLIES) ×2 IMPLANT
GLOVE BIO SURGEON STRL SZ8 (GLOVE) ×2 IMPLANT
GOWN STRL REUS W/ TWL XL LVL3 (GOWN DISPOSABLE) ×1 IMPLANT
GOWN STRL REUS W/TWL XL LVL3 (GOWN DISPOSABLE) ×1
GOWN XL W/COTTON TOWEL STD (GOWNS) ×2 IMPLANT
GUIDEWIRE 0.038 PTFE COATED (WIRE) ×2 IMPLANT
GUIDEWIRE ANG ZIPWIRE 038X150 (WIRE) IMPLANT
GUIDEWIRE STR DUAL SENSOR (WIRE) IMPLANT
IV NS IRRIG 3000ML ARTHROMATIC (IV SOLUTION) ×6 IMPLANT
KIT RM TURNOVER CYSTO AR (KITS) ×2 IMPLANT
MANIFOLD NEPTUNE II (INSTRUMENTS) ×2 IMPLANT
NS IRRIG 500ML POUR BTL (IV SOLUTION) IMPLANT
PACK CYSTO (CUSTOM PROCEDURE TRAY) ×2 IMPLANT
TUBE CONNECTING 12X1/4 (SUCTIONS) ×2 IMPLANT
WATER STERILE IRR 3000ML UROMA (IV SOLUTION) ×2 IMPLANT

## 2016-07-18 NOTE — Op Note (Signed)
PATIENT:  Joshua Peterson  PRE-OPERATIVE DIAGNOSIS: Bladder calculus  POST-OPERATIVE DIAGNOSIS: 1. Bladder calculus. 2. Bladder lesion  PROCEDURE: 1. Cystolitholapaxy (3.2 cm) 2. Cold cup biopsy of bladder lesion  SURGEON:  Claybon Jabs  INDICATION: Joshua Peterson is a 76 year old male who recently began to experience gross hematuria after going for a walk. He was found to have a 3 cm stone within the bladder. These brought to the operating room for cystolitholapaxy.  ANESTHESIA:  General  EBL:  Minimal  DRAINS: None  LOCAL MEDICATIONS USED:  None  SPECIMEN:  1. Bladder lesion to pathology 2. Stone fragments given to patient.  Description of procedure: After informed consent the patient was taken to the operating room and placed on the table in a supine position. General anesthesia was then administered. Once fully anesthetized the patient was moved to the dorsal lithotomy position and the genitalia were sterilely prepped and draped in standard fashion. An official timeout was then performed.  The 23 French rigid cystoscope with 30 lens was then passed under direct vision down the urethra which was noted be normal. The prostatic urethra revealed elongation with trilobar hypertrophy. Upon entering the bladder I noted a very large bladder stone on the floor with a smaller stone on the floor as well. Just inside the bladder neck was a single 2-3 mm lesion that was photographed. The remainder of the bladder was inspected and noted to be free of any other lesions. Both ureteral orifices were of normal configuration and position. There was 2+ trabeculation.  I introduced the cold cup biopsy forceps and biopsied the lesion which was small enough that this completely removed the lesion itself. I then used the Bugbee electrode to fulgurate the biopsy site.  I then used a 1000  holmium laser fiber to systematically fragment the large bladder stone as well as a smaller stone. Once this was  completed I inserted a resectoscope sheath with Timberlake obturator and used this to irrigate the bladder using a Microvasive evacuator and was able to remove all of the stone fragments. Reinspection of the bladder revealed all stone fragments had been removed and the mucosa was noted be intact throughout. The bladder was therefore drained and the cystoscope removed. The patient was awakened and taken to recovery room in stable and satisfactory condition. He tolerated procedure well no intraoperative complications.  PLAN OF CARE: Discharge to home after PACU  PATIENT DISPOSITION:  PACU - hemodynamically stable.

## 2016-07-18 NOTE — Anesthesia Procedure Notes (Signed)
Procedure Name: LMA Insertion Date/Time: 07/18/2016 9:25 AM Performed by: Rica Koyanagi Pre-anesthesia Checklist: Patient identified, Emergency Drugs available, Suction available and Patient being monitored Patient Re-evaluated:Patient Re-evaluated prior to inductionOxygen Delivery Method: Circle system utilized Preoxygenation: Pre-oxygenation with 100% oxygen Intubation Type: IV induction Ventilation: Mask ventilation without difficulty LMA: LMA inserted LMA Size: 5.0 Number of attempts: 1 Airway Equipment and Method: Bite block Placement Confirmation: positive ETCO2 Tube secured with: Tape Dental Injury: Teeth and Oropharynx as per pre-operative assessment

## 2016-07-18 NOTE — Discharge Instructions (Signed)
°  Post Anesthesia Home Care Instructions ° °Activity: °Get plenty of rest for the remainder of the day. A responsible individual must stay with you for 24 hours following the procedure.  °For the next 24 hours, DO NOT: °-Drive a car °-Operate machinery °-Drink alcoholic beverages °-Take any medication unless instructed by your physician °-Make any legal decisions or sign important papers. ° °Meals: °Start with liquid foods such as gelatin or soup. Progress to regular foods as tolerated. Avoid greasy, spicy, heavy foods. If nausea and/or vomiting occur, drink only clear liquids until the nausea and/or vomiting subsides. Call your physician if vomiting continues. ° °Special Instructions/Symptoms: °Your throat may feel dry or sore from the anesthesia or the breathing tube placed in your throat during surgery. If this causes discomfort, gargle with warm salt water. The discomfort should disappear within 24 hours. ° °If you had a scopolamine patch placed behind your ear for the management of post- operative nausea and/or vomiting: ° °1. The medication in the patch is effective for 72 hours, after which it should be removed.  Wrap patch in a tissue and discard in the trash. Wash hands thoroughly with soap and water. °2. You may remove the patch earlier than 72 hours if you experience unpleasant side effects which may include dry mouth, dizziness or visual disturbances. °3. Avoid touching the patch. Wash your hands with soap and water after contact with the patch. °   °Cystoscopy patient instructions ° °Following a cystoscopy, a catheter (a flexible rubber tube) is sometimes left in place to empty the bladder. This may cause some discomfort or a feeling that you need to urinate. Your doctor determines the period of time that the catheter will be left in place. °You may have bloody urine for two to three days (Call your doctor if the amount of bleeding increases or does not subside). ° °You may pass blood clots in your  urine, especially if you had a biopsy. It is not unusual to pass small blood clots and have some bloody urine a couple of weeks after your cystoscopy. Again, call your doctor if the bleeding does not subside. °You may have: °Dysuria (painful urination) °Frequency (urinating often) °Urgency (strong desire to urinate) ° °These symptoms are common especially if medicine is instilled into the bladder or a ureteral stent is placed. Avoiding alcohol and caffeine, such as coffee, tea, and chocolate, may help relieve these symptoms. Drink plenty of water, unless otherwise instructed. Your doctor may also prescribe an antibiotic or other medicine to reduce these symptoms. ° °Cystoscopy results are available soon after the procedure; biopsy results usually take two to four days. Your doctor will discuss the results of your exam with you. Before you go home, you will be given specific instructions for follow-up care. °Special Instructions: ° °1  If you are going home with a catheter in place do not take a tub bath until removed by your doctor.  °2  You may resume your normal activities.  °3  Do not drive or operate machinery if you are taking narcotic pain medicine.  °4  Be sure to keep all follow-up appointments with your doctor.  ° °5 Call Your Doctor If: The catheter is not draining ° You have severe pain ° You are unable to urinate ° You have a fever over 101 ° You have severe bleeding °        ° ° °

## 2016-07-18 NOTE — Anesthesia Preprocedure Evaluation (Signed)
Anesthesia Evaluation  Patient identified by MRN, date of birth, ID band Patient awake    Reviewed: Allergy & Precautions, NPO status , Patient's Chart, lab work & pertinent test results  History of Anesthesia Complications Negative for: history of anesthetic complications  Airway Mallampati: II   Neck ROM: Full    Dental   Pulmonary former smoker,    breath sounds clear to auscultation       Cardiovascular hypertension,  Rhythm:Regular Rate:Normal     Neuro/Psych    GI/Hepatic GERD  ,  Endo/Other  diabetes  Renal/GU Renal InsufficiencyRenal disease     Musculoskeletal   Abdominal (+) + obese,   Peds  Hematology   Anesthesia Other Findings   Reproductive/Obstetrics                             Anesthesia Physical Anesthesia Plan  ASA: III  Anesthesia Plan: General   Post-op Pain Management:    Induction: Intravenous  Airway Management Planned: LMA and Oral ETT  Additional Equipment:   Intra-op Plan:   Post-operative Plan:   Informed Consent: I have reviewed the patients History and Physical, chart, labs and discussed the procedure including the risks, benefits and alternatives for the proposed anesthesia with the patient or authorized representative who has indicated his/her understanding and acceptance.   Dental advisory given  Plan Discussed with:   Anesthesia Plan Comments:         Anesthesia Quick Evaluation

## 2016-07-18 NOTE — Anesthesia Postprocedure Evaluation (Addendum)
Anesthesia Post Note  Patient: Joshua Peterson  Procedure(s) Performed: Procedure(s) (LRB): CYSTOSCOPY WITH LITHOLAPAXY (N/A)  Patient location during evaluation: PACU Anesthesia Type: General Level of consciousness: awake Pain management: pain level controlled Vital Signs Assessment: post-procedure vital signs reviewed and stable Respiratory status: spontaneous breathing Cardiovascular status: stable Postop Assessment: no signs of nausea or vomiting Anesthetic complications: no        Last Vitals:  Vitals:   07/18/16 1100 07/18/16 1115  BP: 137/70 138/73  Pulse: 70 70  Resp: 15 17  Temp:      Last Pain:  Vitals:   07/18/16 1115  TempSrc:   PainSc: 2    Pain Goal: Patients Stated Pain Goal: 4 (07/18/16 0849)               [place]

## 2016-07-18 NOTE — Transfer of Care (Signed)
Last Vitals:  Vitals:   07/18/16 0803  BP: (!) 124/92  Pulse: 72  Resp: 16  Temp: 36.8 C    Last Pain:  Vitals:   07/18/16 0803  TempSrc: Oral      Patients Stated Pain Goal: 4 (07/18/16 0849)  Immediate Anesthesia Transfer of Care Note  Patient: Joshua Peterson  Procedure(s) Performed: Procedure(s) (LRB): CYSTOSCOPY WITH LITHOLAPAXY (N/A)  Patient Location: PACU  Anesthesia Type: General  Level of Consciousness: awake, alert  and oriented  Airway & Oxygen Therapy: Patient Spontanous Breathing and Patient connected to nasal cannula oxygen  Post-op Assessment: Report given to PACU RN and Post -op Vital signs reviewed and stable  Post vital signs: Reviewed and stable  Complications: No apparent anesthesia complications

## 2016-07-19 ENCOUNTER — Encounter (HOSPITAL_BASED_OUTPATIENT_CLINIC_OR_DEPARTMENT_OTHER): Payer: Self-pay | Admitting: Urology

## 2016-07-20 ENCOUNTER — Encounter: Payer: Self-pay | Admitting: Internal Medicine

## 2016-08-02 ENCOUNTER — Ambulatory Visit (INDEPENDENT_AMBULATORY_CARE_PROVIDER_SITE_OTHER): Payer: Medicare Other | Admitting: Physician Assistant

## 2016-08-02 ENCOUNTER — Encounter: Payer: Self-pay | Admitting: Surgery

## 2016-08-02 VITALS — BP 118/72 | HR 72 | Temp 97.5°F | Resp 16 | Ht 70.25 in | Wt 218.0 lb

## 2016-08-02 DIAGNOSIS — J069 Acute upper respiratory infection, unspecified: Secondary | ICD-10-CM

## 2016-08-02 DIAGNOSIS — I824Z9 Acute embolism and thrombosis of unspecified deep veins of unspecified distal lower extremity: Secondary | ICD-10-CM | POA: Diagnosis not present

## 2016-08-02 MED ORDER — PREDNISONE 20 MG PO TABS: ORAL_TABLET | ORAL | 1 refills | Status: DC

## 2016-08-02 MED ORDER — AZITHROMYCIN 250 MG PO TABS: ORAL_TABLET | ORAL | 1 refills | Status: AC

## 2016-08-02 NOTE — Progress Notes (Signed)
Subjective:    Patient ID: Joshua Peterson, male    DOB: 03/02/1941, 76 y.o.   MRN: [id number]  HPI 76 y.o. WM presents with sinus issues, he is going to [place] in 1 month for 62HU anniversary. Having sinus pressure, congestion, sore throat. No SOB, no CP, some night sweats, chills.   Blood pressure 118/72, pulse 72, temperature 97.5 F (36.4 C), resp. rate 16, height 5' 10.25" (1.784 m), weight 218 lb (98.9 kg).  Medications Current Outpatient Prescriptions on File Prior to Visit  Medication Sig  . aspirin 81 MG tablet Take 81 mg by mouth daily.  . Cholecalciferol (VITAMIN D3) 5000 units CAPS Take 1 capsule by mouth daily.  . Cinnamon 500 MG capsule Take 100 mg by mouth 2 (two) times daily.  . fluticasone (FLONASE) 50 MCG/ACT nasal spray Place 2 sprays into both nostrils daily. (Patient taking differently: Place 2 sprays into both nostrils daily as needed. )  . losartan (COZAAR) 100 MG tablet Take 1/2 to 1 tablet by mouth daily for BP. (Patient taking differently: Take 50 mg by mouth every morning. Take 1/2 to 1 tablet by mouth daily for BP.)  . metFORMIN (GLUCOPHAGE-XR) 500 MG 24 hr tablet Take 2 tablet by mouth 2 times daily with the largest meals.  . Multiple Vitamins-Minerals (MEGA MULTIVITAMIN FOR MEN) TABS Take 1 tablet by mouth daily.  . phentermine (ADIPEX-P) 37.5 MG tablet Take 1/2 to 1 tablet every morning for dieting & weightloss  . tamsulosin (FLOMAX) 0.4 MG CAPS capsule Take 1 capsule (0.4 mg total) by mouth daily. (Patient taking differently: Take 0.4 mg by mouth daily after breakfast. )   No current facility-administered medications on file prior to visit.     Problem list He has DVT of lower extremity (deep venous thrombosis) ([place]); CKD stage 2 due to type 2 diabetes mellitus ([place]); Hypertension; Hyperlipidemia; Vitamin D deficiency; Testosterone deficiency; Obesity; Medication management; GERD ; Thrombocytopenia ([place]); Aneurysm of left iliac artery ([place]); Right  nephrolithiasis; Bladder stone; and Gallstones on his problem list.   Review of Systems  Constitutional: Negative for chills, diaphoresis and fever.  HENT: Positive for congestion, sinus pressure, sneezing and sore throat. Negative for ear pain, trouble swallowing and voice change.   Eyes: Negative.   Respiratory: Positive for cough. Negative for chest tightness, shortness of breath and wheezing.   Cardiovascular: Negative.   Gastrointestinal: Negative.   Genitourinary: Negative.   Musculoskeletal: Negative for neck pain.  Neurological: Negative.  Negative for headaches.       Objective:   Physical Exam  Constitutional: He is oriented to person, place, and time. He appears well-developed and well-nourished.  HENT:  Head: Normocephalic and atraumatic.  Right Ear: Hearing and tympanic membrane normal.  Left Ear: Hearing and tympanic membrane normal.  Nose: Right sinus exhibits maxillary sinus tenderness. Left sinus exhibits maxillary sinus tenderness.  Mouth/Throat: Uvula is midline and mucous membranes are normal. Posterior oropharyngeal erythema present. No oropharyngeal exudate, posterior oropharyngeal edema or tonsillar abscesses.  Eyes: Conjunctivae are normal. Pupils are equal, round, and reactive to light.  Neck: Normal range of motion. Neck supple.  Cardiovascular: Normal rate and regular rhythm.   Pulmonary/Chest: Effort normal and breath sounds normal.  Abdominal: Soft. Bowel sounds are normal.  Musculoskeletal: Normal range of motion.  Lymphadenopathy:    He has no cervical adenopathy.  Neurological: He is alert and oriented to person, place, and time.  Skin: Skin is warm and dry. No rash noted.  Assessment & Plan:  1. Deep vein thrombosis (DVT) of distal vein of lower extremity, unspecified chronicity, unspecified laterality (HCC) Continue compression stockings.   2. Acute URI - predniSONE (DELTASONE) 20 MG tablet; 2 tablets daily for 3 days, 1 tablet daily  for 4 days.  Dispense: 10 tablet; Refill: 1 - azithromycin (ZITHROMAX) 250 MG tablet; Take 2 tablets (500 mg) on  Day 1,  followed by 1 tablet (250 mg) once daily on Days 2 through 5.  Dispense: 6 each; Refill: 1

## 2016-08-02 NOTE — Patient Instructions (Signed)

## 2016-08-08 ENCOUNTER — Encounter: Payer: Self-pay | Admitting: Surgery

## 2016-08-08 ENCOUNTER — Ambulatory Visit (INDEPENDENT_AMBULATORY_CARE_PROVIDER_SITE_OTHER): Payer: Medicare Other | Admitting: Surgery

## 2016-08-08 VITALS — BP 140/77 | HR 70 | Temp 97.4°F | Resp 20 | Ht 70.25 in | Wt 218.9 lb

## 2016-08-08 DIAGNOSIS — I723 Aneurysm of iliac artery: Secondary | ICD-10-CM

## 2016-08-08 NOTE — Progress Notes (Signed)
[facility]  Patient name: Joshua Peterson MRN: [id number] DOB: 07/23/1940 Sex: male   REFERRING PROVIDER:    Dr. Melford Aase   REASON FOR CONSULT:    Iliac aneurysm  HISTORY OF PRESENT ILLNESS:   Joshua Peterson is a 76 y.o. male, who is Referred today for evaluation of a right common iliac artery aneurysm measuring 2 cm.  This was an incidental finding on a CT scan for hematuria.  The patient is asymptomatic and does not have any consistent pain.  The patient does have bladder stones.  He is a diabetic which is well controlled.  He has a history of DVT in the remote past.  He is medically managed for hypertension    PAST MEDICAL HISTORY    Past Medical History:  Diagnosis Date  . Allergic rhinitis   . Bladder stone   . CKD (chronic kidney disease), stage II   . External carotid artery stenosis    PT ASYMPTOMATIC per last duplex 05-18-2011  right moderate stenosis and left moderate to severe  . Gross hematuria   . History of adenomatous polyp of colon    2006;  2009 and 2014 tubular adenoma's  . History of DVT in adulthood    08/ 2009 right lower extremity;  2013  left lower extremity  . History of pulmonary embolism    bilatera lung  08/ 2009  . Hypertension   . Hypogonadism male   . Internal carotid artery stenosis, bilateral    PT ASYMPTOMATIC per last duplex 05-18-2011  bilateral 40-59% stenosis  . Lower urinary tract symptoms (LUTS)   . Mixed hyperlipidemia   . Type 2 diabetes mellitus ([place])   . Vitamin D deficiency   . Wears glasses      FAMILY HISTORY   Family History  Problem Relation Age of Onset  . Alzheimer's disease Mother   . Diabetes Mother   . Prostate cancer Father   . Breast cancer Sister   . Colon cancer Neg Hx   . Rectal cancer Neg Hx   . Stomach cancer Neg Hx     SOCIAL HISTORY:   Social History   Social History  . Marital status: Married    Spouse name: N/A  . Number of  children: N/A  . Years of education: N/A   Occupational History  . Not on file.   Social History Main Topics  . Smoking status: Former Smoker    Packs/day: 1.00    Years: 20.00    Types: Cigarettes    Quit date: 04/16/1991  . Smokeless tobacco: Never Used  . Alcohol use 1.2 oz/week    2 Cans of beer per week     Comment: ocassional (1-2 x a week)  . Drug use: No  . Sexual activity: No   Other Topics Concern  . Not on file   Social History Narrative  . No narrative on file    ALLERGIES:    Allergies  Allergen Reactions  . Doxycycline   . Niacin And Related     flushing  . Sulfa Antibiotics Itching    CURRENT MEDICATIONS:    Current Outpatient Prescriptions  Medication Sig Dispense Refill  . aspirin 81 MG tablet Take 81 mg by mouth daily.    . Cholecalciferol (VITAMIN D3) 5000 units CAPS Take 1 capsule by mouth daily.    . Cinnamon 500 MG capsule Take 100 mg by mouth 2 (two) times daily.    Marland Kitchen  fluticasone (FLONASE) 50 MCG/ACT nasal spray Place 2 sprays into both nostrils daily. (Patient taking differently: Place 2 sprays into both nostrils daily as needed. ) 16 g 3  . losartan (COZAAR) 100 MG tablet Take 1/2 to 1 tablet by mouth daily for BP. (Patient taking differently: Take 50 mg by mouth every morning. Take 1/2 to 1 tablet by mouth daily for BP.) 90 tablet 1  . metFORMIN (GLUCOPHAGE-XR) 500 MG 24 hr tablet Take 2 tablet by mouth 2 times daily with the largest meals. 360 tablet 1  . Multiple Vitamins-Minerals (MEGA MULTIVITAMIN FOR MEN) TABS Take 1 tablet by mouth daily.    . phentermine (ADIPEX-P) 37.5 MG tablet Take 1/2 to 1 tablet every morning for dieting & weightloss 30 tablet 5  . predniSONE (DELTASONE) 20 MG tablet 2 tablets daily for 3 days, 1 tablet daily for 4 days. 10 tablet 1  . tamsulosin (FLOMAX) 0.4 MG CAPS capsule Take 1 capsule (0.4 mg total) by mouth daily. (Patient taking differently: Take 0.4 mg by mouth daily after breakfast. ) 90 capsule 3   No  current facility-administered medications for this visit.     REVIEW OF SYSTEMS:   [X]  denotes positive finding, [ ]  denotes negative finding Cardiac  Comments:  Chest pain or chest pressure:    Shortness of breath upon exertion:    Short of breath when lying flat:    Irregular heart rhythm:        Vascular    Pain in calf, thigh, or hip brought on by ambulation:    Pain in feet at night that wakes you up from your sleep:     Blood clot in your veins:    Leg swelling:         Pulmonary    Oxygen at home:    Productive cough:     Wheezing:         Neurologic    Sudden weakness in arms or legs:     Sudden numbness in arms or legs:     Sudden onset of difficulty speaking or slurred speech:    Temporary loss of vision in one eye:     Problems with dizziness:         Gastrointestinal    Blood in stool:      Vomited blood:         Genitourinary    Burning when urinating:  x   Blood in urine: x       Psychiatric    Major depression:         Hematologic    Bleeding problems:    Problems with blood clotting too easily:        Skin    Rashes or ulcers:        Constitutional    Fever or chills:     PHYSICAL EXAM:   Vitals:   08/08/16 1107  BP: 140/77  Pulse: 70  Resp: 20  Temp: 97.4 F (36.3 C)  TempSrc: Oral  SpO2: 99%  Weight: 218 lb 14.4 oz (99.3 kg)  Height: 5' 10.25" (1.784 m)    GENERAL: The patient is a well-nourished male, in no acute distress. The vital signs are documented above. CARDIAC: There is a regular rate and rhythm.  VASCULAR: Palpable pedal pulses.  No carotid bruits. PULMONARY: Nonlabored respirations ABDOMEN: Soft and non-tender   MUSCULOSKELETAL: There are no major deformities or cyanosis. NEUROLOGIC: No focal weakness or paresthesias are detected. SKIN: There are no ulcers  or rashes noted. PSYCHIATRIC: The patient has a normal affect.  STUDIES:   I have reviewed his CT scan which shows a 2 cm distal right common iliac  aneurysm  ASSESSMENT and PLAN   Iliac aneurysm: I discussed with the patient the indications for repair with the growth greater than 3 cm.  I will have a follow-up in one year with an ultrasound.  If he does get a CT scan for his urologic issues, that can be used in place of the ultrasound.   Annamarie Major, MD [facility] [phone] Pager [phone]

## 2016-08-10 NOTE — Addendum Note (Signed)
Addended by: Amy Gothard A on: 08/10/2016 03:05 PM   Modules accepted: Orders  

## 2016-08-19 NOTE — Addendum Note (Signed)
Addendum  created 08/19/16 1428 by Rica Koyanagi, MD   Sign clinical note

## 2016-08-31 ENCOUNTER — Encounter: Payer: Self-pay | Admitting: Internal Medicine

## 2016-09-08 ENCOUNTER — Ambulatory Visit (INDEPENDENT_AMBULATORY_CARE_PROVIDER_SITE_OTHER): Payer: Medicare Other | Admitting: Internal Medicine

## 2016-09-08 VITALS — BP 126/74 | HR 84 | Temp 97.3°F | Resp 16 | Ht 70.25 in | Wt 221.2 lb

## 2016-09-08 DIAGNOSIS — E782 Mixed hyperlipidemia: Secondary | ICD-10-CM | POA: Diagnosis not present

## 2016-09-08 DIAGNOSIS — Z79899 Other long term (current) drug therapy: Secondary | ICD-10-CM

## 2016-09-08 DIAGNOSIS — E1122 Type 2 diabetes mellitus with diabetic chronic kidney disease: Secondary | ICD-10-CM

## 2016-09-08 DIAGNOSIS — N183 Chronic kidney disease, stage 3 unspecified: Secondary | ICD-10-CM

## 2016-09-08 DIAGNOSIS — I10 Essential (primary) hypertension: Secondary | ICD-10-CM | POA: Diagnosis not present

## 2016-09-08 DIAGNOSIS — E559 Vitamin D deficiency, unspecified: Secondary | ICD-10-CM

## 2016-09-08 LAB — CBC WITH DIFFERENTIAL/PLATELET
Basophils Absolute: 83 {cells}/uL (ref 0–200)
Basophils Relative: 1 %
Eosinophils Absolute: 166 {cells}/uL (ref 15–500)
Eosinophils Relative: 2 %
HCT: 36.3 % — ABNORMAL LOW (ref 38.5–50.0)
Hemoglobin: 12.4 g/dL — ABNORMAL LOW (ref 13.2–17.1)
Lymphocytes Relative: 23 %
Lymphs Abs: 1909 {cells}/uL (ref 850–3900)
MCH: 31.4 pg (ref 27.0–33.0)
MCHC: 34.2 g/dL (ref 32.0–36.0)
MCV: 91.9 fL (ref 80.0–100.0)
MPV: 8.8 fL (ref 7.5–12.5)
Monocytes Absolute: 498 {cells}/uL (ref 200–950)
Monocytes Relative: 6 %
Neutro Abs: 5644 {cells}/uL (ref 1500–7800)
Neutrophils Relative %: 68 %
Platelets: 137 K/uL — ABNORMAL LOW (ref 140–400)
RBC: 3.95 MIL/uL — ABNORMAL LOW (ref 4.20–5.80)
RDW: 14.3 % (ref 11.0–15.0)
WBC: 8.3 K/uL (ref 3.8–10.8)

## 2016-09-08 LAB — TSH: TSH: 1.5 m[IU]/L (ref 0.40–4.50)

## 2016-09-08 NOTE — Patient Instructions (Addendum)

## 2016-09-08 NOTE — Progress Notes (Signed)
This very nice 76 y.o. MWM presents for 6 month follow up with Hypertension, Hyperlipidemia, Pre-Diabetes and Vitamin D Deficiency.      Other problems include hx/o DVT x 2 (2009 &2012) each episode following a long plane flight and hypercoag w/u was negative     Patient is treated for HTN (2004) & BP has been controlled at home. Today's BP is at goal -  126/74. Patient has had no complaints of any cardiac type chest pain, palpitations, dyspnea/orthopnea/PND, dizziness, claudication, or dependent edema.     Hyperlipidemia is controlled with diet & meds. Patient denies myalgias or other med SE's. Last Lipids were at goal albeit elevated Trig's: Lab Results  Component Value Date   CHOL 155 06/06/2016   HDL 25 (L) 06/06/2016   LDLCALC 71 06/06/2016   TRIG 297 (H) 06/06/2016   CHOLHDL 6.2 (H) 06/06/2016      Also, the patient has history of T2_NIDDM (2005) w/CKD2  and has had no symptoms of reactive hypoglycemia, diabetic polys, paresthesias or visual blurring.  Last A1c was not at goal: Lab Results  Component Value Date   HGBA1C 6.0 (H) 06/06/2016      Further, the patient also has history of Vitamin D Deficiency ("24" in 2008) and does not supplement vitamin D as repeatedly advised and last vitamin D was still very low: Lab Results  Component Value Date   VD25OH 25 (L) 02/25/2016   Current Outpatient Prescriptions on File Prior to Visit  Medication Sig  . aspirin 81 MG tablet Take 81 mg by mouth daily.  Marland Kitchen VITAMIN D 5000 units CAPS Take 1 cap daily.  . Cinnamon 500 MG Take 100 mg  2  times daily.  Marland Kitchen FLONASE nasal spray Place 2 sprays into both nostrils daily.   Marland Kitchen losartan  100 MG tablet Take 1/2 to 1 tab daily for BP.   . metFORMIN -XR 500 MG  Take 2 tab 2 times daily with the largest meals.  . Multiple Vitamins-Minerals  Take 1 tab daily.  . phentermine  37.5 MG tablet Take 1/2 to 1 tab every morning for dieting & weightloss  . tamsulosin 0.4 MG CAPS  Take 1 cap daily.     Allergies  Allergen Reactions  . Doxycycline   . Niacin And Related     flushing  . Sulfa Antibiotics Itching   PMHx:   Past Medical History:  Diagnosis Date  . Allergic rhinitis   . Bladder stone   . CKD (chronic kidney disease), stage II   . External carotid artery stenosis    PT ASYMPTOMATIC per last duplex 05-18-2011  right moderate stenosis and left moderate to severe  . Gross hematuria   . History of adenomatous polyp of colon    2006;  2009 and 2014 tubular adenoma's  . History of DVT in adulthood    08/ 2009 right lower extremity;  2013  left lower extremity  . History of pulmonary embolism    bilatera lung  08/ 2009  . Hypertension   . Hypogonadism male   . Internal carotid artery stenosis, bilateral    PT ASYMPTOMATIC per last duplex 05-18-2011  bilateral 40-59% stenosis  . Lower urinary tract symptoms (LUTS)   . Mixed hyperlipidemia   . Type 2 diabetes mellitus ([place])   . Vitamin D deficiency   . Wears glasses    Immunization History  Administered Date(s) Administered  . Influenza, High Dose Seasonal PF 01/14/2015, 11/26/2015  .  Pneumococcal Conjugate-13 01/14/2015  . Pneumococcal-Unspecified 04/02/2008  . Td 04/02/2006  . Zoster 04/03/2007   Past Surgical History:  Procedure Laterality Date  . COLONOSCOPY  last one 07-12-2012  . CYSTOSCOPY WITH LITHOLAPAXY N/A 07/18/2016   Procedure: CYSTOSCOPY WITH LITHOLAPAXY;  Surgeon: Kathie Rhodes, MD;  Location: [facility];  Service: Urology;  Laterality: N/A;  . IVC FILTER INSERTION  11/21/2007  . IVC FILTER REMOVAL  01/31/2008  . 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 RIGHT CHEEK/MOUTH BIOPSY  09/16/2008  (office)   squamous hyperplasia w/ hyperkeratosis  . RIGHT DEEP CERVICAL NODE BIOPSY  01/03/2001   right posterior neck (per path report-- lymphoid hyperplasia w/ florid follicular hyperplasia, no malignancy(  . TONSILLECTOMY  age 82   FHx:    Reviewed / unchanged  SHx:     Reviewed / unchanged  Systems Review:  Constitutional: Denies fever, chills, wt changes, headaches, insomnia, fatigue, night sweats, change in appetite. Eyes: Denies redness, blurred vision, diplopia, discharge, itchy, watery eyes.  ENT: Denies discharge, congestion, post nasal drip, epistaxis, sore throat, earache, hearing loss, dental pain, tinnitus, vertigo, sinus pain, snoring.  CV: Denies chest pain, palpitations, irregular heartbeat, syncope, dyspnea, diaphoresis, orthopnea, PND, claudication or edema. Respiratory: denies cough, dyspnea, DOE, pleurisy, hoarseness, laryngitis, wheezing.  Gastrointestinal: Denies dysphagia, odynophagia, heartburn, reflux, water brash, abdominal pain or cramps, nausea, vomiting, bloating, diarrhea, constipation, hematemesis, melena, hematochezia  or hemorrhoids. Genitourinary: Denies dysuria, frequency, urgency, nocturia, hesitancy, discharge, hematuria or flank pain. Musculoskeletal: Denies arthralgias, myalgias, stiffness, jt. swelling, pain, limping or strain/sprain.  Skin: Denies pruritus, rash, hives, warts, acne, eczema or change in skin lesion(s). Neuro: No weakness, tremor, incoordination, spasms, paresthesia or pain. Psychiatric: Denies confusion, memory loss or sensory loss. Endo: Denies change in weight, skin or hair change.  Heme/Lymph: No excessive bleeding, bruising or enlarged lymph nodes.  Physical Exam  BP 126/74   Pulse 84   Temp 97.3 F (36.3 C)   Resp 16   Ht 5' 10.25" (1.784 m)   Wt 221 lb 3.2 oz (100.3 kg)   BMI 31.51 kg/m   Appears well nourished, well groomed  and in no distress.  Eyes: PERRLA, EOMs, conjunctiva no swelling or erythema. Sinuses: No frontal/maxillary tenderness ENT/Mouth: EAC's clear, TM's nl w/o erythema, bulging. Nares clear w/o erythema, swelling, exudates. Oropharynx clear without erythema or exudates. Oral hygiene is good. Tongue normal, non obstructing. Hearing intact.  Neck: Supple. Thyroid nl. Car  2+/2+ without bruits, nodes or JVD. Chest: Respirations nl with BS clear & equal w/o rales, rhonchi, wheezing or stridor.  Cor: Heart sounds normal w/ regular rate and rhythm without sig. murmurs, gallops, clicks or rubs. Peripheral pulses normal and equal  without edema.  Abdomen: Soft & bowel sounds normal. Non-tender w/o guarding, rebound, hernias, masses or organomegaly.  Lymphatics: Unremarkable.  Musculoskeletal: Full ROM all peripheral extremities, joint stability, 5/5 strength and normal gait.  Skin: Warm, dry without exposed rashes, lesions or ecchymosis apparent.  Neuro: Cranial nerves intact, reflexes equal bilaterally. Sensory-motor testing grossly intact. Tendon reflexes grossly intact.  Pysch: Alert & oriented x 3.  Insight and judgement nl & appropriate. No ideations.  Assessment and Plan:  1. Essential hypertension  - Continue medication, monitor blood pressure at home.  - Continue DASH diet. Reminder to go to the ER if any CP,  SOB, nausea, dizziness, severe HA, changes vision/speech,  left arm numbness and tingling and jaw pain.  - CBC with Differential/Platelet - BASIC METABOLIC PANEL  WITH GFR - Magnesium - TSH  2. Hyperlipidemia, mixed  - Continue diet/meds, exercise,& lifestyle modifications.  - Continue monitor periodic cholesterol/liver & renal functions  - Hepatic function panel - Lipid panel - TSH  3. Type 2 diabetes mellitus with stage 3 chronic kidney disease, without long-term current use of insulin (HCC)  - Continue diet, exercise, lifestyle modifications.  - Monitor appropriate labs.  - Hemoglobin A1c - Insulin, random  4. Vitamin D deficiency  - Continue supplementation.  - VITAMIN D 25 Hydroxyl  5. Medication management  - CBC with Differential/Platelet - BASIC METABOLIC PANEL WITH GFR - Hepatic function panel - Magnesium - Lipid panel - TSH - Hemoglobin A1c - Insulin, random - VITAMIN D 25 Hydroxy       Discussed  regular  exercise, BP monitoring, weight control to achieve/maintain BMI less than 25 and discussed med and SE's. Recommended labs to assess and monitor clinical status with further disposition pending results of labs. Over 30 minutes of exam, counseling, chart review was performed.

## 2016-09-09 LAB — HEPATIC FUNCTION PANEL
ALBUMIN: 4.3 g/dL (ref 3.6–5.1)
ALT: 27 U/L (ref 9–46)
AST: 17 U/L (ref 10–35)
Alkaline Phosphatase: 55 U/L (ref 40–115)
Bilirubin, Direct: 0.1 mg/dL (ref <=0.2)
Indirect Bilirubin: 0.6 mg/dL (ref 0.2–1.2)
TOTAL PROTEIN: 6.8 g/dL (ref 6.1–8.1)
Total Bilirubin: 0.7 mg/dL (ref 0.2–1.2)

## 2016-09-09 LAB — VITAMIN D 25 HYDROXY (VIT D DEFICIENCY, FRACTURES): VIT D 25 HYDROXY: 32 ng/mL (ref 30–100)

## 2016-09-09 LAB — BASIC METABOLIC PANEL WITH GFR
BUN: 22 mg/dL (ref 7–25)
CHLORIDE: 106 mmol/L (ref 98–110)
CO2: 19 mmol/L — AB (ref 20–31)
CREATININE: 1.46 mg/dL — AB (ref 0.70–1.18)
Calcium: 9.5 mg/dL (ref 8.6–10.3)
GFR, Est African American: 53 mL/min — ABNORMAL LOW (ref >=60)
GFR, Est Non African American: 46 mL/min — ABNORMAL LOW (ref >=60)
GLUCOSE: 136 mg/dL — AB (ref 65–99)
Potassium: 4.1 mmol/L (ref 3.5–5.3)
Sodium: 139 mmol/L (ref 135–146)

## 2016-09-09 LAB — HEMOGLOBIN A1C
HEMOGLOBIN A1C: 7.1 % — AB (ref <5.7)
MEAN PLASMA GLUCOSE: 157 mg/dL

## 2016-09-09 LAB — LIPID PANEL
CHOL/HDL RATIO: 6.4 ratio — AB (ref <5.0)
Cholesterol: 180 mg/dL (ref <200)
HDL: 28 mg/dL — ABNORMAL LOW (ref >40)
Triglycerides: 481 mg/dL — ABNORMAL HIGH (ref <150)

## 2016-09-09 LAB — MAGNESIUM: MAGNESIUM: 1.6 mg/dL (ref 1.5–2.5)

## 2016-09-09 LAB — INSULIN, RANDOM: INSULIN: 29.3 u[IU]/mL — AB (ref 2.0–19.6)

## 2016-09-10 ENCOUNTER — Encounter: Payer: Self-pay | Admitting: Internal Medicine

## 2016-10-20 ENCOUNTER — Other Ambulatory Visit: Payer: Self-pay | Admitting: Internal Medicine

## 2016-11-04 ENCOUNTER — Other Ambulatory Visit: Payer: Self-pay | Admitting: Internal Medicine

## 2016-11-17 ENCOUNTER — Other Ambulatory Visit: Payer: Self-pay | Admitting: Internal Medicine

## 2016-12-29 ENCOUNTER — Encounter: Payer: Self-pay | Admitting: Physician Assistant

## 2016-12-29 ENCOUNTER — Ambulatory Visit (INDEPENDENT_AMBULATORY_CARE_PROVIDER_SITE_OTHER): Payer: Medicare Other | Admitting: Physician Assistant

## 2016-12-29 VITALS — BP 130/74 | HR 75 | Temp 97.5°F | Resp 16 | Ht 70.25 in | Wt 212.6 lb

## 2016-12-29 DIAGNOSIS — I824Z9 Acute embolism and thrombosis of unspecified deep veins of unspecified distal lower extremity: Secondary | ICD-10-CM | POA: Diagnosis not present

## 2016-12-29 DIAGNOSIS — I10 Essential (primary) hypertension: Secondary | ICD-10-CM

## 2016-12-29 DIAGNOSIS — E559 Vitamin D deficiency, unspecified: Secondary | ICD-10-CM | POA: Diagnosis not present

## 2016-12-29 DIAGNOSIS — E782 Mixed hyperlipidemia: Secondary | ICD-10-CM | POA: Diagnosis not present

## 2016-12-29 DIAGNOSIS — Z23 Encounter for immunization: Secondary | ICD-10-CM

## 2016-12-29 DIAGNOSIS — N183 Chronic kidney disease, stage 3 (moderate): Secondary | ICD-10-CM

## 2016-12-29 DIAGNOSIS — Z79899 Other long term (current) drug therapy: Secondary | ICD-10-CM

## 2016-12-29 DIAGNOSIS — E1122 Type 2 diabetes mellitus with diabetic chronic kidney disease: Secondary | ICD-10-CM | POA: Diagnosis not present

## 2016-12-29 NOTE — Progress Notes (Signed)
Assessment and Plan:   Essential hypertension - continue medications, DASH diet, exercise and monitor at home. Call if greater than 130/80.  -     Hepatic function panel -     CBC with Differential/Platelet -     BASIC METABOLIC PANEL WITH GFR  Hyperlipidemia, mixed -continue medications, check lipids, decrease fatty foods, increase activity.  -     Lipid panel  Type 2 diabetes mellitus with stage 3 chronic kidney disease, without long-term current use of insulin ([place]) Discussed general issues about diabetes pathophysiology and management., Educational material distributed., Suggested low cholesterol diet., Encouraged aerobic exercise., Discussed foot care., Reminded to get yearly retinal exam. -     Hemoglobin A1c  Medication management -     Magnesium  Deep vein thrombosis (DVT) of distal vein of lower extremity, unspecified chronicity, unspecified laterality (HCC)  Needs flu shot -     Flu vaccine HIGH DOSE PF  Need for prophylactic vaccination against diphtheria and tetanus -     Td vaccine greater than or equal to 7yo preservative free IM  Vitamin D deficiency -     VITAMIN D 25 Hydroxy (Vit-D Deficiency, Fractures)    Continue diet and meds as discussed. Further disposition pending results of labs. Discussed med's effects and SE's.  Future Appointments Date Time Provider [place]  03/31/2017 10:00 AM Unk Pinto, MD [facility] None  08/21/2017 9:00 AM [facility]  08/21/2017 9:45 AM Serafina Mitchell, MD [facility]    HPI 76 y.o. male  presents for 3 month follow up with hypertension, hyperlipidemia, diabetes and vitamin D.  His blood pressure has been controlled at home, today their BP is BP: 130/74 He does workout. He denies chest pain, shortness of breath, dizziness.  He is on cholesterol medication, fenofibrate 134 and denies myalgias. His cholesterol is at goal. The cholesterol was:  09/08/2016: Cholesterol 180; HDL 28; LDL Cholesterol  NOT CALC; Triglycerides 481 He has been working on diet and exercise for Diabetes with diabetic chronic kidney disease, he is on bASA, he is on ACE/ARB, he is on metformin,checking sugar 2-3 x a day, and denies polydipsia, polyuria and visual disturbances. Last A1C was: 09/08/2016: Hgb A1c MFr Bld 7.1 Patient is on Vitamin D supplement. 09/08/2016: Vit D, 25-Hydroxy 32  He has history of recurrent DVTs with travel.   BMI is Body mass index is 30.29 kg/m., he is working on diet and exercise. Going to start [employer] program for weight loss.  Wt Readings from Last 3 Encounters:  12/29/16 212 lb 9.6 oz (96.4 kg)  09/08/16 221 lb 3.2 oz (100.3 kg)  08/08/16 218 lb 14.4 oz (99.3 kg)    Current Medications:  Current Outpatient Prescriptions on File Prior to Visit  Medication Sig Dispense Refill  . aspirin 81 MG tablet Take 81 mg by mouth daily.    . Cholecalciferol (VITAMIN D3) 5000 units CAPS Take 1 capsule by mouth daily.    . Cinnamon 500 MG capsule Take 100 mg by mouth 2 (two) times daily.    . fluticasone (FLONASE) 50 MCG/ACT nasal spray Place 2 sprays into both nostrils daily. (Patient taking differently: Place 2 sprays into both nostrils daily as needed. ) 16 g 3  . glucose blood (ONE TOUCH ULTRA TEST) test strip CHECK BLOOD SUGAR 1 TIME EVERY DAY-DX-E11.22. 100 each 2  . losartan (COZAAR) 100 MG tablet TAKE 1/2 TO 1 TABLET BY MOUTH DAILY FOR BP. 90 tablet 1  .  metFORMIN (GLUCOPHAGE-XR) 500 MG 24 hr tablet TAKE 2 TABLET BY MOUTH 2 TIMES DAILY WITH THE LARGEST MEALS. 360 tablet 1  . Multiple Vitamins-Minerals (MEGA MULTIVITAMIN FOR MEN) TABS Take 1 tablet by mouth daily.    . tamsulosin (FLOMAX) 0.4 MG CAPS capsule Take 1 capsule (0.4 mg total) by mouth daily. (Patient taking differently: Take 0.4 mg by mouth daily after breakfast. ) 90 capsule 3   No current facility-administered medications on file prior to visit.    Medical History:  Past Medical History:  Diagnosis Date  . Allergic  rhinitis   . Bladder stone   . CKD (chronic kidney disease), stage II   . External carotid artery stenosis    PT ASYMPTOMATIC per last duplex 05-18-2011  right moderate stenosis and left moderate to severe  . Gross hematuria   . History of adenomatous polyp of colon    2006;  2009 and 2014 tubular adenoma's  . History of DVT in adulthood    08/ 2009 right lower extremity;  2013  left lower extremity  . History of pulmonary embolism    bilatera lung  08/ 2009  . Hypertension   . Hypogonadism male   . Internal carotid artery stenosis, bilateral    PT ASYMPTOMATIC per last duplex 05-18-2011  bilateral 40-59% stenosis  . Lower urinary tract symptoms (LUTS)   . Mixed hyperlipidemia   . Type 2 diabetes mellitus ([place])   . Vitamin D deficiency   . Wears glasses    Allergies:  Allergies  Allergen Reactions  . Doxycycline   . Niacin And Related     flushing  . Sulfa Antibiotics Itching    Review of Systems:  Review of Systems  Constitutional: Negative.   HENT: Negative.   Eyes: Negative.   Respiratory: Negative.   Cardiovascular: Negative.   Gastrointestinal: Negative.   Genitourinary: Negative.   Musculoskeletal: Negative.   Skin: Negative.   Neurological: Negative.   Endo/Heme/Allergies: Negative.   Psychiatric/Behavioral: Negative.     Family history- Review and unchanged Social history- Review and unchanged Physical Exam: BP 130/74   Pulse 75   Temp (!) 97.5 F (36.4 C)   Resp 16   Ht 5' 10.25" (1.784 m)   Wt 212 lb 9.6 oz (96.4 kg)   SpO2 95%   BMI 30.29 kg/m  Wt Readings from Last 3 Encounters:  12/29/16 212 lb 9.6 oz (96.4 kg)  09/08/16 221 lb 3.2 oz (100.3 kg)  08/08/16 218 lb 14.4 oz (99.3 kg)   General Appearance: Well nourished, in no apparent distress. Eyes: PERRLA, EOMs, conjunctiva no swelling or erythema Sinuses: No Frontal/maxillary tenderness ENT/Mouth: Ext aud canals clear, TMs without erythema, bulging. No erythema, swelling, or exudate on  post pharynx.  Tonsils not swollen or erythematous. Hearing normal.  Neck: Supple, thyroid normal.  Respiratory: Respiratory effort normal, BS equal bilaterally without rales, rhonchi, wheezing or stridor.  Cardio: RRR with no MRGs. Brisk peripheral pulses without edema.  Abdomen: Soft, + BS,  Non tender, no guarding, rebound, hernias, masses. Lymphatics: Non tender without lymphadenopathy.  Musculoskeletal: Full ROM, 5/5 strength, normal gait.  Skin: Warm, dry without rashes, lesions, ecchymosis.  Neuro: Cranial nerves intact. No cerebellar symptoms. Sensation intact.  Psych: Awake and oriented X 3, normal affect, Insight and Judgment appropriate.    Vicie Mutters, PA-C 9:47 AM [facility]

## 2016-12-30 LAB — HEPATIC FUNCTION PANEL
AG RATIO: 1.9 (calc) (ref 1.0–2.5)
ALT: 21 U/L (ref 9–46)
AST: 21 U/L (ref 10–35)
Albumin: 4.4 g/dL (ref 3.6–5.1)
Alkaline phosphatase (APISO): 47 U/L (ref 40–115)
BILIRUBIN DIRECT: 0.1 mg/dL (ref 0.0–0.2)
BILIRUBIN INDIRECT: 0.7 mg/dL (ref 0.2–1.2)
GLOBULIN: 2.3 g/dL (ref 1.9–3.7)
TOTAL PROTEIN: 6.7 g/dL (ref 6.1–8.1)
Total Bilirubin: 0.8 mg/dL (ref 0.2–1.2)

## 2016-12-30 LAB — CBC WITH DIFFERENTIAL/PLATELET
BASOS PCT: 1.1 %
Basophils Absolute: 58 cells/uL (ref 0–200)
EOS ABS: 180 {cells}/uL (ref 15–500)
Eosinophils Relative: 3.4 %
HCT: 34.8 % — ABNORMAL LOW (ref 38.5–50.0)
Hemoglobin: 12.1 g/dL — ABNORMAL LOW (ref 13.2–17.1)
Lymphs Abs: 1452 cells/uL (ref 850–3900)
MCH: 31.8 pg (ref 27.0–33.0)
MCHC: 34.8 g/dL (ref 32.0–36.0)
MCV: 91.3 fL (ref 80.0–100.0)
MONOS PCT: 6.5 %
MPV: 9.7 fL (ref 7.5–12.5)
Neutro Abs: 3265 cells/uL (ref 1500–7800)
Neutrophils Relative %: 61.6 %
PLATELETS: 133 10*3/uL — AB (ref 140–400)
RBC: 3.81 10*6/uL — ABNORMAL LOW (ref 4.20–5.80)
RDW: 12.8 % (ref 11.0–15.0)
TOTAL LYMPHOCYTE: 27.4 %
WBC mixed population: 345 cells/uL (ref 200–950)
WBC: 5.3 10*3/uL (ref 3.8–10.8)

## 2016-12-30 LAB — LIPID PANEL
CHOL/HDL RATIO: 5.8 (calc) — AB (ref <5.0)
Cholesterol: 145 mg/dL (ref <200)
HDL: 25 mg/dL — AB (ref >40)
LDL Cholesterol (Calc): 89 mg/dL (calc)
NON-HDL CHOLESTEROL (CALC): 120 mg/dL (ref <130)
Triglycerides: 211 mg/dL — ABNORMAL HIGH (ref <150)

## 2016-12-30 LAB — BASIC METABOLIC PANEL WITH GFR
BUN/Creatinine Ratio: 19 (calc) (ref 6–22)
BUN: 34 mg/dL — AB (ref 7–25)
CO2: 22 mmol/L (ref 20–32)
Calcium: 9.8 mg/dL (ref 8.6–10.3)
Chloride: 110 mmol/L (ref 98–110)
Creat: 1.76 mg/dL — ABNORMAL HIGH (ref 0.70–1.18)
GFR, Est African American: 43 mL/min/{1.73_m2} — ABNORMAL LOW (ref >=60)
GFR, Est Non African American: 37 mL/min/{1.73_m2} — ABNORMAL LOW (ref >=60)
GLUCOSE: 119 mg/dL — AB (ref 65–99)
Potassium: 4.8 mmol/L (ref 3.5–5.3)
Sodium: 142 mmol/L (ref 135–146)

## 2016-12-30 LAB — VITAMIN D 25 HYDROXY (VIT D DEFICIENCY, FRACTURES): Vit D, 25-Hydroxy: 49 ng/mL (ref 30–100)

## 2016-12-30 LAB — HEMOGLOBIN A1C
Hgb A1c MFr Bld: 5.6 % of total Hgb (ref <5.7)
MEAN PLASMA GLUCOSE: 114 (calc)
eAG (mmol/L): 6.3 (calc)

## 2016-12-30 LAB — MAGNESIUM: Magnesium: 1.9 mg/dL (ref 1.5–2.5)

## 2017-02-02 ENCOUNTER — Ambulatory Visit: Payer: Medicare Other

## 2017-02-02 ENCOUNTER — Ambulatory Visit (INDEPENDENT_AMBULATORY_CARE_PROVIDER_SITE_OTHER): Payer: Medicare Other | Admitting: Adult Health

## 2017-02-02 ENCOUNTER — Encounter: Payer: Self-pay | Admitting: Adult Health

## 2017-02-02 VITALS — BP 112/72 | HR 65 | Temp 97.2°F | Ht 70.25 in | Wt 208.8 lb

## 2017-02-02 DIAGNOSIS — M779 Enthesopathy, unspecified: Secondary | ICD-10-CM

## 2017-02-02 DIAGNOSIS — N182 Chronic kidney disease, stage 2 (mild): Secondary | ICD-10-CM | POA: Diagnosis not present

## 2017-02-02 DIAGNOSIS — F329 Major depressive disorder, single episode, unspecified: Secondary | ICD-10-CM | POA: Diagnosis not present

## 2017-02-02 DIAGNOSIS — F32A Depression, unspecified: Secondary | ICD-10-CM

## 2017-02-02 DIAGNOSIS — D696 Thrombocytopenia, unspecified: Secondary | ICD-10-CM | POA: Diagnosis not present

## 2017-02-02 DIAGNOSIS — E1122 Type 2 diabetes mellitus with diabetic chronic kidney disease: Secondary | ICD-10-CM | POA: Diagnosis not present

## 2017-02-02 DIAGNOSIS — M778 Other enthesopathies, not elsewhere classified: Secondary | ICD-10-CM

## 2017-02-02 LAB — CBC WITH DIFFERENTIAL/PLATELET
Basophils Absolute: 50 cells/uL (ref 0–200)
Basophils Relative: 0.8 %
EOS PCT: 3.1 %
Eosinophils Absolute: 192 cells/uL (ref 15–500)
HCT: 35.2 % — ABNORMAL LOW (ref 38.5–50.0)
Hemoglobin: 12.2 g/dL — ABNORMAL LOW (ref 13.2–17.1)
Lymphs Abs: 1742 cells/uL (ref 850–3900)
MCH: 31.5 pg (ref 27.0–33.0)
MCHC: 34.7 g/dL (ref 32.0–36.0)
MCV: 91 fL (ref 80.0–100.0)
MONOS PCT: 5.5 %
MPV: 9.7 fL (ref 7.5–12.5)
NEUTROS PCT: 62.5 %
Neutro Abs: 3875 cells/uL (ref 1500–7800)
PLATELETS: 132 10*3/uL — AB (ref 140–400)
RBC: 3.87 10*6/uL — AB (ref 4.20–5.80)
RDW: 12.8 % (ref 11.0–15.0)
TOTAL LYMPHOCYTE: 28.1 %
WBC mixed population: 341 cells/uL (ref 200–950)
WBC: 6.2 10*3/uL (ref 3.8–10.8)

## 2017-02-02 LAB — BASIC METABOLIC PANEL WITH GFR
BUN/Creatinine Ratio: 17 (calc) (ref 6–22)
BUN: 27 mg/dL — ABNORMAL HIGH (ref 7–25)
CHLORIDE: 112 mmol/L — AB (ref 98–110)
CO2: 23 mmol/L (ref 20–32)
Calcium: 9.4 mg/dL (ref 8.6–10.3)
Creat: 1.56 mg/dL — ABNORMAL HIGH (ref 0.70–1.18)
GFR, Est African American: 49 mL/min/{1.73_m2} — ABNORMAL LOW (ref >=60)
GFR, Est Non African American: 43 mL/min/{1.73_m2} — ABNORMAL LOW (ref >=60)
GLUCOSE: 89 mg/dL (ref 65–99)
POTASSIUM: 5.1 mmol/L (ref 3.5–5.3)
SODIUM: 144 mmol/L (ref 135–146)

## 2017-02-02 MED ORDER — MELOXICAM 15 MG PO TABS: ORAL_TABLET | ORAL | 1 refills | Status: DC

## 2017-02-02 MED ORDER — CITALOPRAM HYDROBROMIDE 20 MG PO TABS: 20.0000 mg | ORAL_TABLET | Freq: Every day | ORAL | 3 refills | Status: DC

## 2017-02-02 MED ORDER — PREDNISONE 20 MG PO TABS: ORAL_TABLET | ORAL | 0 refills | Status: DC

## 2017-02-02 NOTE — Patient Instructions (Addendum)
Start prednisone - may go down to 1 tab a day after the first day if significantly improved. Avoid NSAIDS- ibuprofen, aleve due to your kidney function. May take tylenol.  Rest hand as much as possible - obtain splint to prevent contracture at night. Let me know if symptoms are not improving or are worsening.   Tendinitis Tendinitis is inflammation of a tendon. A tendon is a strong cord of tissue that connects muscle to bone. Tendinitis can affect any tendon, but it most commonly affects the shoulder tendon (rotator cuff), ankle tendon (Achilles tendon), elbow tendon (triceps tendon), or one of the tendons in the wrist. What are the causes? This condition may be caused by:  Overusing a tendon or muscle. This is common.  Age-related wear and tear.  Injury.  Inflammatory conditions, such as arthritis.  Certain medicines.  What increases the risk? This condition is more likely to develop in people who do activities that involve repetitive motions. What are the signs or symptoms? Symptoms of this condition may include:  Pain.  Tenderness.  Mild swelling.  How is this diagnosed? This condition is diagnosed with a physical exam. You may also have tests, such as:  Ultrasound. This uses sound waves to make an image of your affected area.  MRI.  How is this treated? This condition may be treated by resting, icing, applying pressure (compression), and raising (elevating) the area above the level of your heart. This is known as RICE therapy. Treatment may also include:  Medicines to help reduce inflammation or to help reduce pain.  Exercises or physical therapy to strengthen and stretch the tendon.  A brace or splint.  Surgery (rare).  Follow these instructions at home:  If you have a splint or brace:  Wear the splint or brace as told by your health care provider. Remove it only as told by your health care provider.  Loosen the splint or brace if your fingers or toes tingle,  become numb, or turn cold and blue.  Do not take baths, swim, or use a hot tub until your health care provider approves. Ask your health care provider if you can take showers. You may only be allowed to take sponge baths for bathing.  Do not let your splint or brace get wet if it is not waterproof. ? If your splint or brace is not waterproof, cover it with a watertight plastic bag when you take a bath or a shower.  Keep the splint or brace clean. Managing pain, stiffness, and swelling  If directed, apply ice to the affected area. ? Put ice in a plastic bag. ? Place a towel between your skin and the bag. ? Leave the ice on for 20 minutes, 2-3 times a day.  If directed, apply heat to the affected area as often as told by your health care provider. Use the heat source that your health care provider recommends, such as a moist heat pack or a heating pad. ? Place a towel between your skin and the heat source. ? Leave the heat on for 20-30 minutes. ? Remove the heat if your skin turns bright red. This is especially important if you are unable to feel pain, heat, or cold. You may have a greater risk of getting burned.  Move the fingers or toes of the affected limb often, if this applies. This can help to prevent stiffness and lessen swelling.  If directed, elevate the affected area above the level of your heart while you are  sitting or lying down. Driving  Do not drive or operate heavy machinery while taking prescription pain medicine.  Ask your health care provider when it is safe to drive if you have a splint or brace on any part of your arm or leg. Activity  Return to your normal activities as told by your health care provider. Ask your health care provider what activities are safe for you.  Rest the affected area as told by your health care provider.  Avoid using the affected area while you are experiencing symptoms of tendinitis.  Do exercises as told by your health care  provider. General instructions  If you have a splint, do not put pressure on any part of the splint until it is fully hardened. This may take several hours.  Wear an elastic bandage or compression wrap only as told by your health care provider.  Take over-the-counter and prescription medicines only as told by your health care provider.  Keep all follow-up visits as told by your health care provider. This is important. Contact a health care provider if:  Your symptoms do not improve.  You develop new, unexplained problems, such as numbness in your hands. This information is not intended to replace advice given to you by your health care provider. Make sure you discuss any questions you have with your health care provider. Document Released: 03/04/2000 Document Revised: 11/05/2015 Document Reviewed: 12/08/2014 Elsevier Interactive Patient Education  [employer].

## 2017-02-02 NOTE — Progress Notes (Signed)
Assessment and Plan:  76 was seen today for hand problem.  Diagnoses and all orders for this visit:  Tendonitis of finger -     meloxicam (MOBIC) 15 MG tablet; Take one daily with food for 2 weeks, can take with tylenol, can not take with aleve, iburpofen, then as needed daily for pain       -     Rest hand as much as possible, avoid aggravating motions, obtain brace to immobilize finger at night if you continue to experience contractures.        -     Call if symptoms are not improving or worsen.    CKD stage 2 due to type 2 diabetes mellitus ([place]) -     Discussed avoiding NSAIDs, drinking 80-100 fluid ounces of water daily -     BASIC METABOLIC PANEL WITH GFR  Thrombocytopenia (HCC) -     CBC with Differential/Platelet  Depression/mood Start celexa 20 mg daily; follow up with Dr. Melford Peterson as scheduled  Lifestyle discussed: diet/exerise, sleep hygiene, stress management, hydration   Further disposition pending results of labs. Discussed med's effects and SE's.   Over 30 minutes of exam, counseling, chart review, and critical decision making was performed.   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 He has had a single episode where he woke up and found the 2nd digit contracted. He has tried ibuprofen which has helped. Denies radiating pain, changes in sensation. Denies weakness of extremity.   He also has CKD  in stage 3 with notable acute decline at last visit, and anemia/thrombocytopenia  that appear to be progressing. Discussed he needs to be avoiding NSAIDs and increase fluids. He denies excessive bleeding thought does endorse frequent bruising of forearms.  Will follow up on labs today as well.   The patient describes depressive symptoms, increased irritation over the past several weeks/months. He reports he becomes angry and has been snapping at his wife. He denies some difficulty with sleep as well. Medications discussed and will be initiated today.    Lab Results  Component Value Date   GFRNONAA 37 (L) 12/29/2016   CBC    Component Value Date/Time   WBC 5.3 12/29/2016 1002   RBC 3.81 (L) 12/29/2016 1002   HGB 12.1 (L) 12/29/2016 1002   HCT 34.8 (L) 12/29/2016 1002   PLT 133 (L) 12/29/2016 1002   MCV 91.3 12/29/2016 1002   MCH 31.8 12/29/2016 1002   MCHC 34.8 12/29/2016 1002   RDW 12.8 12/29/2016 1002   LYMPHSABS 1,452 12/29/2016 1002   MONOABS 498 09/08/2016 1523   EOSABS 180 12/29/2016 1002   BASOSABS 58 12/29/2016 1002       Past Medical History:  Diagnosis Date  . Allergic rhinitis   . Bladder stone   . CKD (chronic kidney disease), stage II   . External carotid artery stenosis    PT ASYMPTOMATIC per last duplex 05-18-2011  right moderate stenosis and left moderate to severe  . Gross hematuria   . History of adenomatous polyp of colon  2006;  2009 and 2014 tubular adenoma's  . History of DVT in adulthood    08/ 2009 right lower extremity;  2013  left lower extremity  . History of pulmonary embolism    bilatera lung  08/ 2009  . Hypertension   . Hypogonadism male   . Internal carotid artery stenosis, bilateral    PT ASYMPTOMATIC per last duplex 05-18-2011  bilateral 40-59% stenosis  . Lower urinary tract symptoms (LUTS)   . Mixed hyperlipidemia   . Type 2 diabetes mellitus ([place])   . Vitamin D deficiency   . Wears glasses      Allergies  Allergen Reactions  . Doxycycline   . Niacin And Related     flushing  . Sulfa Antibiotics  Itching    Current Outpatient Medications on File Prior to Visit  Medication Sig  . aspirin 81 MG tablet Take 81 mg by mouth daily.  . Cholecalciferol (VITAMIN D3) 5000 units CAPS Take 1 capsule by mouth daily.  . Cinnamon 500 MG capsule Take 100 mg by mouth 2 (two) times daily.  . fluticasone (FLONASE) 50 MCG/ACT nasal spray Place 2 sprays into both nostrils daily. (Patient taking differently: Place 2 sprays into both nostrils daily as needed. )  . glucose blood (ONE TOUCH ULTRA TEST) test strip CHECK BLOOD SUGAR 1 TIME EVERY DAY-DX-E11.22.  . losartan (COZAAR) 100 MG tablet TAKE 1/2 TO 1 TABLET BY MOUTH DAILY FOR BP.  . metFORMIN (GLUCOPHAGE-XR) 500 MG 24 hr tablet TAKE 2 TABLET BY MOUTH 2 TIMES DAILY WITH THE LARGEST MEALS.  . Multiple Vitamins-Minerals (MEGA MULTIVITAMIN FOR MEN) TABS Take 1 tablet by mouth daily.  . tamsulosin (FLOMAX) 0.4 MG CAPS capsule Take 1 capsule (0.4 mg total) by mouth daily. (Patient taking differently: Take 0.4 mg by mouth daily after breakfast. )   No current facility-administered medications on file prior to visit.     ROS: Review of Systems  Constitutional: Negative for malaise/fatigue and weight loss.  HENT: Negative for hearing loss and tinnitus.   Eyes: Negative for blurred vision and double vision.  Respiratory: Negative for cough, shortness of breath and wheezing.   Cardiovascular: Negative for chest pain, palpitations, orthopnea, claudication and leg swelling.  Gastrointestinal: Negative for abdominal pain, blood in stool, constipation, diarrhea, heartburn, melena, nausea and vomiting.  Genitourinary: Negative.   Musculoskeletal: Negative for joint pain and myalgias.  Skin: Negative for rash.  Neurological: Negative for dizziness, tingling, sensory change, weakness and headaches.  Endo/Heme/Allergies: Negative for polydipsia.  Psychiatric/Behavioral: Negative.   All other systems reviewed and are negative.    Physical Exam:  BP 112/72    Pulse 65   Temp (!) 97.2 F (36.2 C)   Ht 5' 10.25" (1.784 m)   Wt 208 lb 12.8 oz (94.7 kg)   SpO2 98%   BMI 29.75 kg/m   General Appearance: Well nourished, in no apparent distress. Neck: Supple.   Respiratory: Respiratory effort normal, BS equal bilaterally without rales, rhonchi, wheezing or stridor.  Cardio: RRR with no MRGs. Brisk peripheral pulses without edema.  Abdomen: Soft, + BS.  Non tender, no guarding, rebound, hernias, masses. Lymphatics: Non tender without lymphadenopathy.  Musculoskeletal: 5/5 strength, normal gait. No visible or palpable abnormality. Full ROM through L upper extremity. Skin: Warm, dry without rashes, lesions, ecchymosis.  Neuro: Cranial nerves intact. Normal muscle tone, no cerebellar symptoms. Sensation intact.  Psych: Awake and oriented X 3, normal affect, Insight and Judgment appropriate.     Gorden Harms  Wilma Flavin, NP 9:22 AM [facility]

## 2017-03-31 ENCOUNTER — Ambulatory Visit (INDEPENDENT_AMBULATORY_CARE_PROVIDER_SITE_OTHER): Payer: Medicare Other | Admitting: Internal Medicine

## 2017-03-31 VITALS — BP 110/62 | HR 60 | Temp 97.3°F | Resp 18 | Ht 69.75 in | Wt 210.4 lb

## 2017-03-31 DIAGNOSIS — Z1212 Encounter for screening for malignant neoplasm of rectum: Secondary | ICD-10-CM

## 2017-03-31 DIAGNOSIS — E782 Mixed hyperlipidemia: Secondary | ICD-10-CM

## 2017-03-31 DIAGNOSIS — N183 Chronic kidney disease, stage 3 (moderate): Secondary | ICD-10-CM

## 2017-03-31 DIAGNOSIS — Z136 Encounter for screening for cardiovascular disorders: Secondary | ICD-10-CM | POA: Diagnosis not present

## 2017-03-31 DIAGNOSIS — Z0000 Encounter for general adult medical examination without abnormal findings: Secondary | ICD-10-CM

## 2017-03-31 DIAGNOSIS — E1122 Type 2 diabetes mellitus with diabetic chronic kidney disease: Secondary | ICD-10-CM

## 2017-03-31 DIAGNOSIS — E559 Vitamin D deficiency, unspecified: Secondary | ICD-10-CM

## 2017-03-31 DIAGNOSIS — N401 Enlarged prostate with lower urinary tract symptoms: Secondary | ICD-10-CM

## 2017-03-31 DIAGNOSIS — K219 Gastro-esophageal reflux disease without esophagitis: Secondary | ICD-10-CM

## 2017-03-31 DIAGNOSIS — I723 Aneurysm of iliac artery: Secondary | ICD-10-CM

## 2017-03-31 DIAGNOSIS — Z87891 Personal history of nicotine dependence: Secondary | ICD-10-CM

## 2017-03-31 DIAGNOSIS — Z125 Encounter for screening for malignant neoplasm of prostate: Secondary | ICD-10-CM

## 2017-03-31 DIAGNOSIS — I10 Essential (primary) hypertension: Secondary | ICD-10-CM | POA: Diagnosis not present

## 2017-03-31 DIAGNOSIS — Z0001 Encounter for general adult medical examination with abnormal findings: Secondary | ICD-10-CM

## 2017-03-31 DIAGNOSIS — Z79899 Other long term (current) drug therapy: Secondary | ICD-10-CM

## 2017-03-31 DIAGNOSIS — Z1211 Encounter for screening for malignant neoplasm of colon: Secondary | ICD-10-CM

## 2017-03-31 NOTE — Progress Notes (Signed)
ADULT & ADOLESCENT INTERNAL MEDICINE   Unk Pinto, M.D.     Uvaldo Bristle. Silverio Lay, P.A.-C Liane Comber, [place]                [address] [place] [address] Telephone [phone] Telefax [phone] Annual  Screening/Preventative Visit  & Comprehensive Evaluation & Examination     This very nice 77 y.o. MWM presents for a Screening/Preventative Visit & comprehensive evaluation and management of multiple medical co-morbidities.  Patient has been followed for HTN, T2_NIDDM, Hyperlipidemia and Vitamin D Deficiency.  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 Other problem includes GERD controlled w/diet. Patient has obstructive BPH with LUTS.      HTN predates since 2004. Patient's BP has been controlled at home.  Today's BP is at goal - 110/62. Patient denies any cardiac symptoms as chest pain, palpitations, shortness of breath, dizziness or ankle swelling.     Patient's hyperlipidemia is controlled with diet and medications. Patient denies myalgias or other medication SE's. 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  He alleges that his CBG's range or average about 120 mg%. Last A1c was Normal at goal: Lab Results  Component Value Date   HGBA1C 5.6 03/31/2017       Finally, patient has history of Vitamin D Deficiency ("24"/2008) and last vitamin D was still low (goal 70-100): Lab Results  Component Value Date   VD25OH 42 03/31/2017   Current Outpatient Medications on File Prior to Visit  Medication Sig  . aspirin 81 MG  tablet Take 81 mg by mouth daily.  . Cholecalciferol (VITAMIN D3) 5000 units CAPS Take 1 capsule by mouth daily.  . Cinnamon 500 MG capsule Take 100 mg by mouth 2 (two) times daily.  . citalopram (CELEXA) 20 MG tablet Take 1 tablet (20 mg total) daily by mouth.  . fluticasone (FLONASE) 50 MCG/ACT nasal spray Place 2 sprays into both nostrils daily. (Patient taking differently: Place 2 sprays into both nostrils daily as needed. )  . glucose blood (ONE TOUCH ULTRA TEST) test strip CHECK BLOOD SUGAR 1 TIME EVERY DAY-DX-E11.22.  . losartan (COZAAR) 100 MG tablet TAKE 1/2 TO 1 TABLET BY MOUTH DAILY FOR BP.  . metFORMIN (GLUCOPHAGE-XR) 500 MG 24 hr tablet TAKE 2 TABLET BY MOUTH 2 TIMES DAILY WITH THE LARGEST MEALS.  . Multiple Vitamins-Minerals (MEGA MULTIVITAMIN FOR MEN) TABS Take 1 tablet by mouth daily.  . tamsulosin (FLOMAX) 0.4 MG CAPS capsule Take 1 capsule (0.4 mg total) by mouth daily. (Patient taking differently: Take 0.4 mg by mouth daily after breakfast. )   No current facility-administered medications on file prior to visit.    Allergies  Allergen Reactions  . Doxycycline   . Niacin And Related     flushing  . Sulfa Antibiotics Itching   Past Medical History:  Diagnosis Date  . Allergic rhinitis   . Bladder stone   . CKD (chronic kidney disease), stage  II   . External carotid artery stenosis    PT ASYMPTOMATIC per last duplex 05-18-2011  right moderate stenosis and left moderate to severe  . Gross hematuria   . History of adenomatous polyp of colon    2006;  2009 and 2014 tubular adenoma's  . History of DVT in adulthood    08/ 2009 right lower extremity;  2013  left lower extremity  . History of pulmonary embolism    bilatera lung  08/ 2009  . Hypertension   . Hypogonadism male   . Internal carotid artery stenosis, bilateral    PT ASYMPTOMATIC per last duplex 05-18-2011  bilateral 40-59% stenosis  . Lower urinary tract symptoms (LUTS)   . Mixed hyperlipidemia   . Type 2  diabetes mellitus ([place])   . Vitamin D deficiency   . Wears glasses    Health Maintenance  Topic Date Due  . OPHTHALMOLOGY EXAM  07/04/2017  . COLONOSCOPY  07/31/2017  . HEMOGLOBIN A1C  09/28/2017  . FOOT EXAM  03/31/2018  . TETANUS/TDAP  12/30/2026  . INFLUENZA VACCINE  Completed  . PNA vac Low Risk Adult  Completed   Immunization History  Administered Date(s) Administered  . Influenza, High Dose Seasonal PF 01/14/2015, 11/26/2015, 12/29/2016  . Pneumococcal Conjugate-13 01/14/2015  . Pneumococcal-Unspecified 04/02/2008  . Td 04/02/2006, 12/29/2016  . Zoster 04/03/2007   Last Colon -  Past Surgical History:  Procedure Laterality Date  . COLONOSCOPY  last one 07-12-2012  . CYSTOSCOPY WITH LITHOLAPAXY N/A 07/18/2016   Procedure: CYSTOSCOPY WITH LITHOLAPAXY;  Surgeon: Kathie Rhodes, MD;  Location: [facility];  Service: Urology;  Laterality: N/A;  . IVC FILTER INSERTION  11/21/2007  . IVC FILTER REMOVAL  01/31/2008  . 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 RIGHT CHEEK/MOUTH BIOPSY  09/16/2008  (office)   squamous hyperplasia w/ hyperkeratosis  . RIGHT DEEP CERVICAL NODE BIOPSY  01/03/2001   right posterior neck (per path report-- lymphoid hyperplasia w/ florid follicular hyperplasia, no malignancy(  . TONSILLECTOMY  age 16   Family History  Problem Relation Age of Onset  . Alzheimer's disease Mother   . Diabetes Mother   . Prostate cancer Father   . Breast cancer Sister   . Colon cancer Neg Hx   . Rectal cancer Neg Hx   . Stomach cancer Neg Hx    Social History   Socioeconomic History  . Marital status: Married    Spouse name: [name]  . Number of children: None  Occupational History  . Retired [employer]  Tobacco Use  . Smoking status: Former Smoker    Packs/day: 1.00    Years: 20.00    Pack years: 20.00    Types: Cigarettes    Last attempt to quit: 04/16/1991    Years since quitting: 25.9  . Smokeless tobacco: Never  Used  Substance and Sexual Activity  . Alcohol use: Yes    Alcohol/week: 1.2 oz    Types: 2 Cans of beer per week    Comment: ocassional (1-2 x a week)  . Drug use: No  . Sexual activity: No    ROS Constitutional: Denies fever, chills, weight loss/gain, headaches, insomnia,  night sweats or change in appetite. Does c/o fatigue. Eyes: Denies redness, blurred vision, diplopia, discharge, itchy or watery eyes.  ENT: Denies discharge, congestion, post nasal drip, epistaxis, sore throat, earache, hearing loss, dental pain, Tinnitus, Vertigo, Sinus pain or snoring.  Cardio: Denies chest pain, palpitations, irregular heartbeat,  syncope, dyspnea, diaphoresis, orthopnea, PND, claudication or edema Respiratory: denies cough, dyspnea, DOE, pleurisy, hoarseness, laryngitis or wheezing.  Gastrointestinal: Denies dysphagia, heartburn, reflux, water brash, pain, cramps, nausea, vomiting, bloating, diarrhea, constipation, hematemesis, melena, hematochezia, jaundice or hemorrhoids Genitourinary: Denies dysuria, frequency, urgency,  discharge, hematuria or flank pain. Has nocturia x 1-2 and occas hesitancy. Musculoskeletal: Denies arthralgia, myalgia, stiffness, Jt. Swelling, pain, limp or strain/sprain. Denies Falls. Skin: Denies puritis, rash, hives, warts, acne, eczema or change in skin lesion Neuro: No weakness, tremor, incoordination, spasms, paresthesia or pain Psychiatric: Denies confusion, memory loss or sensory loss. Denies Depression. Endocrine: Denies change in weight, skin, hair change, nocturia, and paresthesia, diabetic polys, visual blurring or hyper / hypo glycemic episodes.  Heme/Lymph: No excessive bleeding, bruising or enlarged lymph nodes.  Physical Exam  BP 110/62   Pulse 60   Temp (!) 97.3 F (36.3 C)   Resp 18   Ht 5' 9.75" (1.772 m)   Wt 210 lb 6.4 oz (95.4 kg)   BMI 30.41 kg/m   General Appearance: Over nourished and well groomed and in no apparent distress.  Eyes:  PERRLA, EOMs, conjunctiva no swelling or erythema, normal fundi and vessels. Sinuses: No frontal/maxillary tenderness ENT/Mouth: EACs patent / TMs  nl. Nares clear without erythema, swelling, mucoid exudates. Oral hygiene is good. No erythema, swelling, or exudate. Tongue normal, non-obstructing. Tonsils not swollen or erythematous. Hearing normal.  Neck: Supple, thyroid normal. No bruits, nodes or JVD. Respiratory: Respiratory effort normal.  BS equal and clear bilateral without rales, rhonci, wheezing or stridor. Cardio: Heart sounds are normal with regular rate and rhythm and no murmurs, rubs or gallops. Peripheral pulses are normal and equal bilaterally without edema. No aortic or femoral bruits. Chest: symmetric with normal excursions and percussion.  Abdomen: Soft, with Nl bowel sounds. Nontender, no guarding, rebound, hernias, masses, or organomegaly.  Lymphatics: Non tender without lymphadenopathy.  Genitourinary: No hernias.Testes nl. DRE - prostate nl for age - smooth & firm w/o nodules. Musculoskeletal: Full ROM all peripheral extremities, joint stability, 5/5 strength, and normal gait. Skin: Warm and dry without rashes, lesions, cyanosis, clubbing or  ecchymosis.  Neuro: Cranial nerves intact, reflexes equal bilaterally. Normal muscle tone, no cerebellar symptoms. Sensation intact to touch, vibratory and Monofilament to the toes bilaterally. Pysch: Alert and oriented X 3 with normal affect, insight and judgment appropriate.   Assessment and Plan  1. Annual Preventative/Screening Exam   2. Essential hypertension  - EKG 12-Lead - [place], RETROPERITNL ABD,  LTD - Urinalysis, Routine w reflex microscopic - Microalbumin / creatinine urine ratio - CBC with Differential/Platelet - BASIC METABOLIC PANEL WITH GFR - Magnesium - TSH  3. Hyperlipidemia, mixed  - EKG 12-Lead - [place], RETROPERITNL ABD,  LTD - Hepatic function panel - Lipid panel - TSH  4. Type 2 diabetes mellitus with  stage 3 chronic kidney disease, without long-term current use of insulin (HCC)  - EKG 12-Lead - [place], RETROPERITNL ABD,  LTD - Urinalysis, Routine w reflex microscopic - Microalbumin / creatinine urine ratio - HM DIABETES FOOT EXAM - LOW EXTREMITY NEUR EXAM DOCUM - Hemoglobin A1c - Insulin, random  5. Vitamin D deficiency  - VITAMIN D 25 Hydroxy  6. Gastroesophageal reflux disease  - CBC with Differential/Platelet  7. Screening for colorectal cancer  - POC Hemoccult Bld/St  8. Prostate cancer screening  - PSA  9. Benign localized prostatic hyperplasia with lower urinary tract symptoms (LUTS)  - PSA  10. Screening for ischemic heart disease  -  EKG 12-Lead  11. Screening for AAA (aortic abdominal aneurysm)  - [place], RETROPERITNL ABD,  LTD  12. Former smoker  - EKG 12-Lead - [place], RETROPERITNL ABD,  LTD  13. 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  Discussed med effects and SE's. Routine screening labs and tests as requested with regular follow-up as recommended. Over 40 minutes of exam, counseling, chart review and high complex critical decision making was performed

## 2017-03-31 NOTE — Patient Instructions (Signed)

## 2017-04-02 ENCOUNTER — Encounter: Payer: Self-pay | Admitting: Internal Medicine

## 2017-04-03 LAB — URINALYSIS, ROUTINE W REFLEX MICROSCOPIC
BILIRUBIN URINE: NEGATIVE
Glucose, UA: NEGATIVE
HGB URINE DIPSTICK: NEGATIVE
KETONES UR: NEGATIVE
Leukocytes, UA: NEGATIVE
Nitrite: NEGATIVE
Protein, ur: NEGATIVE
SPECIFIC GRAVITY, URINE: 1.01 (ref 1.001–1.03)

## 2017-04-03 LAB — CBC WITH DIFFERENTIAL/PLATELET
BASOS ABS: 52 {cells}/uL (ref 0–200)
Basophils Relative: 0.8 %
EOS PCT: 3.8 %
Eosinophils Absolute: 247 cells/uL (ref 15–500)
HEMATOCRIT: 37.2 % — AB (ref 38.5–50.0)
Hemoglobin: 12.9 g/dL — ABNORMAL LOW (ref 13.2–17.1)
Lymphs Abs: 1807 cells/uL (ref 850–3900)
MCH: 31.4 pg (ref 27.0–33.0)
MCHC: 34.7 g/dL (ref 32.0–36.0)
MCV: 90.5 fL (ref 80.0–100.0)
MPV: 9.5 fL (ref 7.5–12.5)
Monocytes Relative: 4.9 %
NEUTROS PCT: 62.7 %
Neutro Abs: 4076 cells/uL (ref 1500–7800)
PLATELETS: 140 10*3/uL (ref 140–400)
RBC: 4.11 10*6/uL — AB (ref 4.20–5.80)
RDW: 13.5 % (ref 11.0–15.0)
TOTAL LYMPHOCYTE: 27.8 %
WBC mixed population: 319 cells/uL (ref 200–950)
WBC: 6.5 10*3/uL (ref 3.8–10.8)

## 2017-04-03 LAB — BASIC METABOLIC PANEL WITH GFR
BUN / CREAT RATIO: 16 (calc) (ref 6–22)
BUN: 25 mg/dL (ref 7–25)
CO2: 25 mmol/L (ref 20–32)
Calcium: 9.9 mg/dL (ref 8.6–10.3)
Chloride: 108 mmol/L (ref 98–110)
Creat: 1.54 mg/dL — ABNORMAL HIGH (ref 0.70–1.18)
GFR, EST NON AFRICAN AMERICAN: 43 mL/min/{1.73_m2} — AB (ref >=60)
GFR, Est African American: 50 mL/min/{1.73_m2} — ABNORMAL LOW (ref >=60)
GLUCOSE: 111 mg/dL — AB (ref 65–99)
POTASSIUM: 4.5 mmol/L (ref 3.5–5.3)
SODIUM: 143 mmol/L (ref 135–146)

## 2017-04-03 LAB — MICROALBUMIN / CREATININE URINE RATIO
Creatinine, Urine: 58 mg/dL (ref 20–320)
Microalb Creat Ratio: 3 mcg/mg creat (ref <30)
Microalb, Ur: 0.2 mg/dL

## 2017-04-03 LAB — LIPID PANEL
CHOL/HDL RATIO: 4.7 (calc) (ref <5.0)
Cholesterol: 150 mg/dL (ref <200)
HDL: 32 mg/dL — ABNORMAL LOW (ref >40)
LDL Cholesterol (Calc): 90 mg/dL (calc)
NON-HDL CHOLESTEROL (CALC): 118 mg/dL (ref <130)
Triglycerides: 190 mg/dL — ABNORMAL HIGH (ref <150)

## 2017-04-03 LAB — HEPATIC FUNCTION PANEL
AG RATIO: 2.2 (calc) (ref 1.0–2.5)
ALKALINE PHOSPHATASE (APISO): 52 U/L (ref 40–115)
ALT: 18 U/L (ref 9–46)
AST: 16 U/L (ref 10–35)
Albumin: 4.8 g/dL (ref 3.6–5.1)
BILIRUBIN INDIRECT: 0.8 mg/dL (ref 0.2–1.2)
BILIRUBIN TOTAL: 1 mg/dL (ref 0.2–1.2)
Bilirubin, Direct: 0.2 mg/dL (ref 0.0–0.2)
Globulin: 2.2 g/dL (calc) (ref 1.9–3.7)
TOTAL PROTEIN: 7 g/dL (ref 6.1–8.1)

## 2017-04-03 LAB — PSA: PSA: 0.5 ng/mL (ref <=4.0)

## 2017-04-03 LAB — HEMOGLOBIN A1C
EAG (MMOL/L): 6.3 (calc)
HEMOGLOBIN A1C: 5.6 %{Hb} (ref <5.7)
MEAN PLASMA GLUCOSE: 114 (calc)

## 2017-04-03 LAB — TSH: TSH: 1.8 mIU/L (ref 0.40–4.50)

## 2017-04-03 LAB — MAGNESIUM: Magnesium: 2 mg/dL (ref 1.5–2.5)

## 2017-04-03 LAB — INSULIN, RANDOM: INSULIN: 11.6 u[IU]/mL (ref 2.0–19.6)

## 2017-04-03 LAB — VITAMIN D 25 HYDROXY (VIT D DEFICIENCY, FRACTURES): Vit D, 25-Hydroxy: 42 ng/mL (ref 30–100)

## 2017-04-17 ENCOUNTER — Encounter: Payer: Self-pay | Admitting: Adult Health

## 2017-04-17 ENCOUNTER — Ambulatory Visit: Payer: Medicare Other | Admitting: Adult Health

## 2017-04-17 VITALS — BP 116/64 | HR 73 | Temp 97.7°F | Wt 215.0 lb

## 2017-04-17 DIAGNOSIS — J00 Acute nasopharyngitis [common cold]: Secondary | ICD-10-CM

## 2017-04-17 MED ORDER — AZITHROMYCIN 250 MG PO TABS: ORAL_TABLET | ORAL | 1 refills | Status: AC

## 2017-04-17 MED ORDER — PROMETHAZINE-DM 6.25-15 MG/5ML PO SYRP: 5.0000 mL | ORAL_SOLUTION | Freq: Four times a day (QID) | ORAL | 1 refills | Status: DC | PRN

## 2017-04-17 MED ORDER — FLUTICASONE PROPIONATE 50 MCG/ACT NA SUSP: 2.0000 | Freq: Every day | NASAL | 2 refills | Status: DC | PRN

## 2017-04-17 NOTE — Patient Instructions (Addendum)

## 2017-04-17 NOTE — Progress Notes (Signed)
Assessment and Plan:  Moiz was seen today for uri.  Diagnoses and all orders for this visit:  Acute nasopharyngitis (common cold) Benign exam - Discussed the importance of avoiding unnecessary antibiotic therapy. Suggested symptomatic OTC remedies. Nasal saline spray for congestion. Nasal steroids, allergy pill Follow up as needed. -     promethazine-dextromethorphan (PROMETHAZINE-DM) 6.25-15 MG/5ML syrup; Take 5 mLs by mouth 4 (four) times daily as needed for cough. -     fluticasone (FLONASE) 50 MCG/ACT nasal spray; Place 2 sprays into both nostrils daily as needed.  Printed to be filled should symptoms worsen or he develops a fever: -     azithromycin (ZITHROMAX) 250 MG tablet; Take 2 tablets (500 mg) on  Day 1,  followed by 1 tablet (250 mg) once daily on Days 2 through 5.   Further disposition pending results of labs. Discussed med's effects and SE's.   Over 15 minutes of exam, counseling, chart review, and critical decision making was performed.   Future Appointments  Date Time Provider [place]  07/11/2017 11:15 AM Liane Comber, NP [facility] None  08/21/2017  9:00 AM [facility] 3 [facility]  08/21/2017  9:45 AM Serafina Mitchell, MD [facility]  10/10/2017 11:00 AM Unk Pinto, MD [facility] None  04/30/2018 10:00 AM Unk Pinto, MD [facility] None    ------------------------------------------------------------------------------------------------------------------   HPI BP 116/64   Pulse 73   Temp 97.7 F (36.5 C)   Wt 215 lb (97.5 kg)   SpO2 98%   BMI 31.07 kg/m   76 y.o.male presents for cough (productive of scant yellow phlegm), sore throat, chest congestion 4-5 days. Endorses intermittent mild headache. Denies fever/chills, myalgias, N/V/diarrhea. Denies chest pain, dyspnea, rashes. Symptoms are improving.   He has been sucking on halls throat lozenges and advil cold and sinus medication. He has been using flonase but is nearly out.    Past Medical History:  Diagnosis Date  . Allergic rhinitis   . Bladder stone   . CKD (chronic kidney disease), stage II   . External carotid artery stenosis    PT ASYMPTOMATIC per last duplex 05-18-2011  right moderate stenosis and left moderate to severe  . Gross hematuria   . History of adenomatous polyp of colon    2006;  2009 and 2014 tubular adenoma's  . History of DVT in adulthood    08/ 2009 right lower extremity;  2013  left lower extremity  . History of pulmonary embolism    bilatera lung  08/ 2009  . Hypertension   . Hypogonadism male   . Internal carotid artery stenosis, bilateral    PT ASYMPTOMATIC per last duplex 05-18-2011  bilateral 40-59% stenosis  . Lower urinary tract symptoms (LUTS)   . Mixed hyperlipidemia   . Type 2 diabetes mellitus ([place])   . Vitamin D deficiency   . Wears glasses      Allergies  Allergen Reactions  . Doxycycline   . Niacin And Related     flushing  . Sulfa Antibiotics Itching    Current Outpatient Medications on File Prior to Visit  Medication Sig  . aspirin 81 MG tablet Take 81 mg by mouth daily.  . Cholecalciferol (VITAMIN D3) 5000 units CAPS Take 1 capsule by mouth daily.  . Cinnamon 500 MG capsule Take 100 mg by mouth 2 (two) times daily.  . fluticasone (FLONASE) 50 MCG/ACT nasal spray Place 2 sprays into both nostrils daily. (Patient taking differently: Place 2 sprays into both nostrils  daily as needed. )  . glucose blood (ONE TOUCH ULTRA TEST) test strip CHECK BLOOD SUGAR 1 TIME EVERY DAY-DX-E11.22.  . losartan (COZAAR) 100 MG tablet TAKE 1/2 TO 1 TABLET BY MOUTH DAILY FOR BP.  . metFORMIN (GLUCOPHAGE-XR) 500 MG 24 hr tablet TAKE 2 TABLET BY MOUTH 2 TIMES DAILY WITH THE LARGEST MEALS.  . Multiple Vitamins-Minerals (MEGA MULTIVITAMIN FOR MEN) TABS Take 1 tablet by mouth daily.  . tamsulosin (FLOMAX) 0.4 MG CAPS capsule Take 1 capsule (0.4 mg total) by mouth daily. (Patient taking differently: Take 0.4 mg by mouth daily after  breakfast. )  . citalopram (CELEXA) 20 MG tablet Take 1 tablet (20 mg total) daily by mouth. (Patient not taking: Reported on 04/17/2017)   No current facility-administered medications on file prior to visit.     ROS: all negative except above.   Physical Exam:  BP 116/64   Pulse 73   Temp 97.7 F (36.5 C)   Wt 215 lb (97.5 kg)   SpO2 98%   BMI 31.07 kg/m   General Appearance: Well nourished, in no apparent distress. Eyes: PERRLA, EOMs, conjunctiva no swelling or erythema Sinuses: No Frontal/maxillary tenderness ENT/Mouth: Ext aud canals clear, TMs without erythema, bulging. No erythema, swelling, or exudate on post pharynx.  Tonsils not swollen or erythematous. Hearing normal.  Neck: Supple, thyroid normal.  Respiratory: Respiratory effort normal, BS equal bilaterally without rales, rhonchi, wheezing or stridor.  Cardio: RRR with no MRGs. Brisk peripheral pulses without edema.  Abdomen: Soft, + BS.  Non tender, no guarding, rebound, hernias, masses. Lymphatics: Non tender without lymphadenopathy.  Musculoskeletal: normal gait.  Skin: Warm, dry without rashes, lesions, ecchymosis.  Neuro: Cranial nerves intact. Normal muscle tone, no cerebellar symptoms. Sensation intact.  Psych: Awake and oriented X 3, normal affect, Insight and Judgment appropriate.     Izora Ribas, NP 2:47 PM [facility]

## 2017-04-26 ENCOUNTER — Other Ambulatory Visit: Payer: Self-pay

## 2017-04-26 DIAGNOSIS — F329 Major depressive disorder, single episode, unspecified: Secondary | ICD-10-CM

## 2017-04-26 DIAGNOSIS — F32A Depression, unspecified: Secondary | ICD-10-CM

## 2017-04-26 MED ORDER — CITALOPRAM HYDROBROMIDE 20 MG PO TABS: 20.0000 mg | ORAL_TABLET | Freq: Every day | ORAL | 3 refills | Status: DC

## 2017-05-10 ENCOUNTER — Other Ambulatory Visit: Payer: Self-pay | Admitting: *Deleted

## 2017-05-10 MED ORDER — METFORMIN HCL ER 500 MG PO TB24: ORAL_TABLET | ORAL | 1 refills | Status: DC

## 2017-05-15 ENCOUNTER — Other Ambulatory Visit: Payer: Self-pay | Admitting: Adult Health

## 2017-05-15 ENCOUNTER — Telehealth: Payer: Self-pay

## 2017-05-15 MED ORDER — MECLIZINE HCL 25 MG PO TABS: ORAL_TABLET | ORAL | 0 refills | Status: DC

## 2017-05-15 NOTE — Telephone Encounter (Signed)
Patient is requesting an rx for patches for motion sickness. Please advise

## 2017-05-16 NOTE — Telephone Encounter (Signed)
LMTCB

## 2017-05-17 NOTE — Telephone Encounter (Signed)
Patient aware of rx being sent in.

## 2017-07-08 ENCOUNTER — Other Ambulatory Visit: Payer: Self-pay | Admitting: Adult Health

## 2017-07-09 ENCOUNTER — Encounter: Payer: Self-pay | Admitting: Physician Assistant

## 2017-07-09 NOTE — Progress Notes (Signed)
MEDICARE ANNUAL WELLNESS VISIT AND FOLLOW UP Assessment:   Essential hypertension - continue medications, DASH diet, exercise and monitor at home. Call if greater than 130/80.  -     CBC with Differential/Platelet -     COMPLETE METABOLIC PANEL WITH GFR -     TSH  Aneurysm of left iliac artery (HCC) Control blood pressure, cholesterol, glucose, increase exercise.   CKD stage 2 due to type 2 diabetes mellitus ([place]) Discussed general issues about diabetes pathophysiology and management., Educational material distributed., Suggested low cholesterol diet., Encouraged aerobic exercise., Discussed foot care., Reminded to get yearly retinal exam. -     TSH -     Hemoglobin A1c  Mixed hyperlipidemia -continue medications, check lipids, decrease fatty foods, increase activity.  -     Lipid panel  Thrombocytopenia ([place]) Will monitor -     CBC with Differential/Platelet  Recurrent major depressive disorder, in partial remission ([place]) - continue medications, stress management techniques discussed, increase water, good sleep hygiene discussed, increase exercise, and increase veggies.   Vitamin D deficiency Continue supplement  Medication management -     Hepatic function panel; Future  Testosterone deficiency Hypogonadism- continue replacement therapy, check testosterone levels as needed.   Class 1 obesity due to excess calories with serious comorbidity and body mass index (BMI) of 31.0 to 31.9 in adult - follow up 3 months for progress monitoring - increase veggies, decrease carbs - long discussion about weight loss, diet, and exercise  Encounter for Medicare annual wellness exam 1 year  Toenail fungus -     Hepatic function panel; Future -     terbinafine (LAMISIL) 250 MG tablet; Take 1 tablet (250 mg total) by mouth daily. Take one daily for 3 months, need liver function lab at 6 weeks.   Over 30 minutes of exam, counseling, chart review, and critical decision making was  performed  Future Appointments  Date Time Provider [place]  09/04/2017  8:00 AM [facility] - [facility]  09/04/2017  9:00 AM Serafina Mitchell, MD [facility]  10/10/2017 11:00 AM Unk Pinto, MD [facility] None  04/30/2018 10:00 AM Unk Pinto, MD [facility] None     Plan:   During the course of the visit the patient was educated and counseled about appropriate screening and preventive services including:    Pneumococcal vaccine   Influenza vaccine  Prevnar 13  Td vaccine  Screening electrocardiogram  Colorectal cancer screening  Diabetes screening  Glaucoma screening  Nutrition counseling    Subjective:  Joshua Peterson is a 77 y.o. male who presents for Medicare Annual Wellness Visit and 3 month follow up for HTN, hyperlipidemia, diabetes, and vitamin D Def.   His blood pressure has been controlled at home, today their BP is BP: 126/74 He does not workout. He denies chest pain, shortness of breath, dizziness.   He has history of recurrent DVTs with travel. He is on cholesterol medication and denies myalgias. His cholesterol is not at goal. The cholesterol last visit was:   Lab Results  Component Value Date   CHOL 150 03/31/2017   HDL 32 (L) 03/31/2017   LDLCALC 90 03/31/2017   TRIG 190 (H) 03/31/2017   CHOLHDL 4.7 03/31/2017   He has been working on diet and exercise for Diabetes with diabetic chronic kidney disease, he has worked very hard to get down his weight and his sugars, he is on metformin 4 a day, his weight is back up and  his sugars have been elevated  and denies paresthesia of the feet, polydipsia, polyuria and visual disturbances. Last A1C was:  Lab Results  Component Value Date   HGBA1C 5.6 03/31/2017   Last GFR Lab Results  Component Value Date   GFRNONAA 43 (L) 03/31/2017    Patient is on Vitamin D supplement.   Lab Results  Component Value Date   VD25OH 42 03/31/2017     BMI is Body mass index is 32.34 kg/m.,  he is working on diet and exercise. He was doing blu sky. Started back yesterday.  Wt Readings from Last 3 Encounters:  07/11/17 223 lb 12.8 oz (101.5 kg)  04/17/17 215 lb (97.5 kg)  03/31/17 210 lb 6.4 oz (95.4 kg)    Medication Review:  Current Outpatient Medications (Endocrine & Metabolic):  .  metFORMIN (GLUCOPHAGE-XR) 500 MG 24 hr tablet, TAKE 2 TABLET BY MOUTH 2 TIMES DAILY WITH THE LARGEST MEALS.  Current Outpatient Medications (Cardiovascular):  .  losartan (COZAAR) 100 MG tablet, TAKE 1/2 TO 1 TABLET BY MOUTH DAILY FOR BP.  Current Outpatient Medications (Respiratory):  .  fluticasone (FLONASE) 50 MCG/ACT nasal spray, USE 2 SPRAYS IN EACH NOSTRIL DAILY AS NEEDED  Current Outpatient Medications (Analgesics):  .  aspirin 81 MG tablet, Take 81 mg by mouth daily.   Current Outpatient Medications (Other):  Marland Kitchen  Cholecalciferol (VITAMIN D3) 5000 units CAPS, Take 1 capsule by mouth daily. .  Cinnamon 500 MG capsule, Take 100 mg by mouth 2 (two) times daily. .  citalopram (CELEXA) 20 MG tablet, Take 1 tablet (20 mg total) by mouth daily. Marland Kitchen  glucose blood (ONE TOUCH ULTRA TEST) test strip, CHECK BLOOD SUGAR 1 TIME EVERY DAY-[id number]. .  meclizine (ANTIVERT) 25 MG tablet, 1/2-1 pill up to 3 times daily for motion sickness/dizziness .  Multiple Vitamins-Minerals (MEGA MULTIVITAMIN FOR MEN) TABS, Take 1 tablet by mouth daily. .  tamsulosin (FLOMAX) 0.4 MG CAPS capsule, Take 1 capsule (0.4 mg total) by mouth daily. (Patient taking differently: Take 0.4 mg by mouth daily after breakfast. )  Allergies: Allergies  Allergen Reactions  . Doxycycline   . Niacin And Related     flushing  . Sulfa Antibiotics Itching    Current Problems (verified) has History of DVT of lower extremity; CKD stage 2 due to type 2 diabetes mellitus ([place]); Hypertension; Hyperlipidemia; Vitamin D deficiency; Testosterone deficiency; Obesity; Medication management; GERD ; Thrombocytopenia ([place]); Aneurysm of left  iliac artery (HCC); Gallstones; and Depression on their problem list.  Screening Tests Immunization History  Administered Date(s) Administered  . Influenza, High Dose Seasonal PF 01/14/2015, 11/26/2015, 12/29/2016  . Pneumococcal Conjugate-13 01/14/2015  . Pneumococcal-Unspecified 04/02/2008  . Td 04/02/2006, 12/29/2016  . Zoster 04/03/2007    Preventative care: Last colonoscopy: 07/2012 Dr. Deatra Ina due this year Declines TDAP due to cost CXR 2017  Names of Other Physician/Practitioners you currently use: 1. [facility] here for primary care 2. Dr. Delman Cheadle, eye doctor, last visit 2018 he is DUE 3. Dr. Mariea Clonts, dentist, last visit 5 months ago Patient Care Team: Unk Pinto, MD as PCP - General (Internal Medicine)  Surgical: He  has a past surgical history that includes RIGHT DEEP CERVICAL NODE BIOPSY (01/03/2001); RIGHT CHEEK/MOUTH BIOPSY (09/16/2008  (office)); Colonoscopy (last one 07-12-2012); IVC FILTER INSERTION (11/21/2007); IVC FILTER REMOVAL (01/31/2008); Tonsillectomy (age 55); Orchiopexy (Bilateral, 1950's); and Cystoscopy with litholapaxy (N/A, 07/18/2016). Family His family history includes Alzheimer's disease in his mother; Breast cancer in his  sister; Diabetes in his mother; Prostate cancer in his father. Social history  He reports that he quit smoking about 26 years ago. His smoking use included cigarettes. He has a 20.00 pack-year smoking history. He has never used smokeless tobacco. He reports that he drinks about 1.2 oz of alcohol per week. He reports that he does not use drugs.  MEDICARE WELLNESS OBJECTIVES: Physical activity: Current Exercise Habits: Home exercise routine, Type of exercise: walking(plays golf), Time (Minutes): 30, Frequency (Times/Week): 4, Weekly Exercise (Minutes/Week): 120, Intensity: Mild Cardiac risk factors: Cardiac Risk Factors include: advanced age (>49men, >39 women);diabetes  mellitus;dyslipidemia;hypertension;male gender;obesity (BMI >30kg/m2);sedentary lifestyle Depression/mood screen:   Depression screen [facility] 2/9 07/11/2017  Decreased Interest 0  Down, Depressed, Hopeless 0  PHQ - 2 Score 0    ADLs:  In your present state of health, do you have any difficulty performing the following activities: 07/11/2017 04/02/2017  Hearing? N N  Vision? N N  Difficulty concentrating or making decisions? Y N  Walking or climbing stairs? N -  Dressing or bathing? N N  Doing errands, shopping? N N  Some recent data might be hidden     Cognitive Testing  Alert? Yes  Normal Appearance?Yes  Oriented to person? Yes  Place? Yes   Time? Yes  Recall of three objects?  Yes  Can perform simple calculations? Yes  Displays appropriate judgment?Yes  Can read the correct time from a watch face?Yes  EOL planning: Does Patient Have a Medical Advance Directive?: Yes Type of Advance Directive: Healthcare Power of Attorney, Living will Copy of [place] in Chart?: No - copy requested   Objective:   Today's Vitals   07/11/17 1100  BP: 126/74  Pulse: 67  Temp: (!) 97.3 F (36.3 C)  SpO2: 98%  Weight: 223 lb 12.8 oz (101.5 kg)  Height: 5' 9.75" (1.772 m)  PainSc: 1   PainLoc: Back   Body mass index is 32.34 kg/m.  Wt Readings from Last 3 Encounters:  07/11/17 223 lb 12.8 oz (101.5 kg)  04/17/17 215 lb (97.5 kg)  03/31/17 210 lb 6.4 oz (95.4 kg)     General appearance: alert, no distress, WD/WN, male obese HEENT: normocephalic, sclerae anicteric, TMs pearly, nares patent, no discharge or erythema, pharynx normal.  Left eye with external hordeoleum.  No active drainage or discharge.  No conjunctival injections Oral cavity: MMM, no lesions Neck: supple, no lymphadenopathy, no thyromegaly, no masses Heart: RRR, normal S1, S2, no murmurs Lungs: CTA bilaterally, no wheezes, rhonchi, or rales Abdomen: +bs, soft, non tender, non distended, no masses, no  hepatomegaly, no splenomegaly Musculoskeletal: nontender, no swelling, no obvious deformity Extremities: no edema, no cyanosis, no clubbing Pulses: 2+ symmetric, upper and lower extremities, normal cap refill Neurological: alert, oriented x 3, CN2-12 intact, strength normal upper extremities and lower extremities, sensation normal throughout, DTRs 2+ throughout, no cerebellar signs, gait normal Psychiatric: normal affect, behavior normal, pleasant   Medicare Attestation I have personally reviewed: The patient's medical and social history Their use of alcohol, tobacco or illicit drugs Their current medications and supplements The patient's functional ability including ADLs,fall risks, home safety risks, cognitive, and hearing and visual impairment Diet and physical activities Evidence for depression or mood disorders  The patient's weight, height, BMI, and visual acuity have been recorded in the chart.  I have made referrals, counseling, and provided education to the patient based on review of the above and I have provided the patient with a written personalized  care plan for preventive services.     Vicie Mutters, PA-C   07/11/2017

## 2017-07-11 ENCOUNTER — Ambulatory Visit: Payer: Self-pay | Admitting: Adult Health

## 2017-07-11 ENCOUNTER — Ambulatory Visit: Payer: Medicare Other | Admitting: Physician Assistant

## 2017-07-11 ENCOUNTER — Encounter: Payer: Self-pay | Admitting: Physician Assistant

## 2017-07-11 VITALS — BP 126/74 | HR 67 | Temp 97.3°F | Ht 69.75 in | Wt 223.8 lb

## 2017-07-11 DIAGNOSIS — B351 Tinea unguium: Secondary | ICD-10-CM | POA: Diagnosis not present

## 2017-07-11 DIAGNOSIS — E349 Endocrine disorder, unspecified: Secondary | ICD-10-CM | POA: Diagnosis not present

## 2017-07-11 DIAGNOSIS — Z0001 Encounter for general adult medical examination with abnormal findings: Secondary | ICD-10-CM

## 2017-07-11 DIAGNOSIS — I723 Aneurysm of iliac artery: Secondary | ICD-10-CM

## 2017-07-11 DIAGNOSIS — F3341 Major depressive disorder, recurrent, in partial remission: Secondary | ICD-10-CM

## 2017-07-11 DIAGNOSIS — R6889 Other general symptoms and signs: Secondary | ICD-10-CM | POA: Diagnosis not present

## 2017-07-11 DIAGNOSIS — N182 Chronic kidney disease, stage 2 (mild): Secondary | ICD-10-CM

## 2017-07-11 DIAGNOSIS — E559 Vitamin D deficiency, unspecified: Secondary | ICD-10-CM | POA: Diagnosis not present

## 2017-07-11 DIAGNOSIS — Z6832 Body mass index (BMI) 32.0-32.9, adult: Secondary | ICD-10-CM

## 2017-07-11 DIAGNOSIS — E782 Mixed hyperlipidemia: Secondary | ICD-10-CM

## 2017-07-11 DIAGNOSIS — I10 Essential (primary) hypertension: Secondary | ICD-10-CM

## 2017-07-11 DIAGNOSIS — Z0000 Encounter for general adult medical examination without abnormal findings: Secondary | ICD-10-CM

## 2017-07-11 DIAGNOSIS — Z79899 Other long term (current) drug therapy: Secondary | ICD-10-CM

## 2017-07-11 DIAGNOSIS — E6609 Other obesity due to excess calories: Secondary | ICD-10-CM

## 2017-07-11 DIAGNOSIS — E1122 Type 2 diabetes mellitus with diabetic chronic kidney disease: Secondary | ICD-10-CM

## 2017-07-11 DIAGNOSIS — Z6831 Body mass index (BMI) 31.0-31.9, adult: Secondary | ICD-10-CM

## 2017-07-11 DIAGNOSIS — D696 Thrombocytopenia, unspecified: Secondary | ICD-10-CM

## 2017-07-11 MED ORDER — TERBINAFINE HCL 250 MG PO TABS: 250.0000 mg | ORAL_TABLET | Freq: Every day | ORAL | 0 refills | Status: AC

## 2017-07-11 NOTE — Patient Instructions (Addendum)
Do you have astelin or atrovent nasal spray at home? This is good for runny nose, can do up to 3 x a day.   Check out  Mini habits for weight loss book  2 apps for tracking food is myfitness pal  loseit OR can take picture of your food  Are you an emotional eater? Do you eat more when you're feeling stressed? Do you eat when you're not hungry or when you're full? Do you eat to feel better (to calm and soothe yourself when you're sad, mad, bored, anxious, etc.)? Do you reward yourself with food? Do you regularly eat until you've stuffed yourself? Does food make you feel safe? Do you feel like food is a friend? Do you feel powerless or out of control around food?  If you answered yes to some of these questions than it is likely that you are an emotional eater. This is normally a learned behavior and can take time to first recognize the signs and second BREAK THE HABIT. But here is more information and tips to help.   The difference between emotional hunger and physical hunger Emotional hunger can be powerful, so it's easy to mistake it for physical hunger. But there are clues you can look for to help you tell physical and emotional hunger apart.  Emotional hunger comes on suddenly. It hits you in an instant and feels overwhelming and urgent. Physical hunger, on the other hand, comes on more gradually. The urge to eat doesn't feel as dire or demand instant satisfaction (unless you haven't eaten for a very long time).  Emotional hunger craves specific comfort foods. When you're physically hungry, almost anything sounds good-including healthy stuff like vegetables. But emotional hunger craves junk food or sugary snacks that provide an instant rush. You feel like you need cheesecake or pizza, and nothing else will do.  Emotional hunger often leads to mindless eating. Before you know it, you've eaten a whole bag of chips or an entire pint of ice cream without really paying attention or fully  enjoying it. When you're eating in response to physical hunger, you're typically more aware of what you're doing.  Emotional hunger isn't satisfied once you're full. You keep wanting more and more, often eating until you're uncomfortably stuffed. Physical hunger, on the other hand, doesn't need to be stuffed. You feel satisfied when your stomach is full.  Emotional hunger isn't located in the stomach. Rather than a growling belly or a pang in your stomach, you feel your hunger as a craving you can't get out of your head. You're focused on specific textures, tastes, and smells.  Emotional hunger often leads to regret, guilt, or shame. When you eat to satisfy physical hunger, you're unlikely to feel guilty or ashamed because you're simply giving your body what it needs. If you feel guilty after you eat, it's likely because you know deep down that you're not eating for nutritional reasons.  Identify your emotional eating triggers What situations, places, or feelings make you reach for the comfort of food? Most emotional eating is linked to unpleasant feelings, but it can also be triggered by positive emotions, such as rewarding yourself for achieving a goal or celebrating a holiday or happy event. Common causes of emotional eating include:  Stuffing emotions - Eating can be a way to temporarily silence or "stuff down" uncomfortable emotions, including anger, fear, sadness, anxiety, loneliness, resentment, and shame. While you're numbing yourself with food, you can avoid the difficult emotions you'd rather  not feel.  Boredom or feelings of emptiness - Do you ever eat simply to give yourself something to do, to relieve boredom, or as a way to fill a void in your life? You feel unfulfilled and empty, and food is a way to occupy your mouth and your time. In the moment, it fills you up and distracts you from underlying feelings of purposelessness and dissatisfaction with your life.  Childhood habits - Think  back to your childhood memories of food. Did your parents reward good behavior with ice cream, take you out for pizza when you got a good report card, or serve you sweets when you were feeling sad? These habits can often carry over into adulthood. Or your eating may be driven by nostalgia-for cherished memories of grilling burgers in the backyard with your dad or baking and eating cookies with your mom.  Social influences - Getting together with other people for a meal is a great way to relieve stress, but it can also lead to overeating. It's easy to overindulge simply because the food is there or because everyone else is eating. You may also overeat in social situations out of nervousness. Or perhaps your family or circle of friends encourages you to overeat, and it's easier to go along with the group.  Stress - Ever notice how stress makes you hungry? It's not just in your mind. When stress is chronic, as it so often is in our chaotic, fast-paced world, your body produces high levels of the stress hormone, cortisol. Cortisol triggers cravings for salty, sweet, and fried foods-foods that give you a burst of energy and pleasure. The more uncontrolled stress in your life, the more likely you are to turn to food for emotional relief.  Find other ways to feed your feelings If you don't know how to manage your emotions in a way that doesn't involve food, you won't be able to control your eating habits for very long. Diets so often fail because they offer logical nutritional advice which only works if you have conscious control over your eating habits. It doesn't work when emotions hijack the process, demanding an immediate payoff with food.  In order to stop emotional eating, you have to find other ways to fulfill yourself emotionally. It's not enough to understand the cycle of emotional eating or even to understand your triggers, although that's a huge first step. You need alternatives to food that you can  turn to for emotional fulfillment.  Alternatives to emotional eating If you're depressed or lonely, call someone who always makes you feel better, play with your dog or cat, or look at a favorite photo or cherished memento.  If you're anxious, expend your nervous energy by dancing to your favorite song, squeezing a stress ball, or taking a brisk walk.  If you're exhausted, treat yourself with a hot cup of tea, take a bath, light some scented candles, or wrap yourself in a warm blanket.  If you're bored, read a good book, watch a comedy show, explore the outdoors, or turn to an activity you enjoy (woodworking, playing the guitar, shooting hoops, scrapbooking, etc.).  What is mindful eating? Mindful eating is a practice that develops your awareness of eating habits and allows you to pause between your triggers and your actions. Most emotional eaters feel powerless over their food cravings. When the urge to eat hits, you feel an almost unbearable tension that demands to be fed, right now. Because you've tried to resist in the past  and failed, you believe that your willpower just isn't up to snuff. But the truth is that you have more power over your cravings than you think.  Take 5 before you give in to a craving Emotional eating tends to be automatic and virtually mindless. Before you even realize what you're doing, you've reached for a tub of ice cream and polished off half of it. But if you can take a moment to pause and reflect when you're hit with a craving, you give yourself the opportunity to make a different decision.  Can you put off eating for five minutes? Or just start with one minute. Don't tell yourself you can't give in to the craving; remember, the forbidden is extremely tempting. Just tell yourself to wait.  While you're waiting, check in with yourself. How are you feeling? What's going on emotionally? Even if you end up eating, you'll have a better understanding of why you did it.  This can help you set yourself up for a different response next time.  How to practice mindful eating Eating while you're also doing other things-such as watching TV, driving, or playing with your phone-can prevent you from fully enjoying your food. Since your mind is elsewhere, you may not feel satisfied or continue eating even though you're no longer hungry. Eating more mindfully can help focus your mind on your food and the pleasure of a meal and curb overeating.   Eat your meals in a calm place with no distractions, aside from any dining companions.  Try eating with your non-dominant hand or using chopsticks instead of a knife and fork. Eating in such a non-familiar way can slow down how fast you eat and ensure your mind stays focused on your food.  Allow yourself enough time not to have to rush your meal. Set a timer for 20 minutes and pace yourself so you spend at least that much time eating.  Take small bites and chew them well, taking time to notice the different flavors and textures of each mouthful.  Put your utensils down between bites. Take time to consider how you feel-hungry, satiated-before picking up your utensils again.  Try to stop eating before you are full.It takes time for the signal to reach your brain that you've had enough. Don't feel obligated to always clean your plate.  When you've finished your food, take a few moments to assess if you're really still hungry before opting for an extra serving or dessert.  Learn to accept your feelings-even the bad ones  While it may seem that the core problem is that you're powerless over food, emotional eating actually stems from feeling powerless over your emotions. You don't feel capable of dealing with your feelings head on, so you avoid them with food.  Recommended reading  Mini Habits for weight loss  Healthy Eating: A guide to the new nutrition - [employer] Report  10 Tips for Mindful Eating  - How mindfulness can help you fully enjoy a meal and the experience of eating-with moderation and restraint. ([place])  Weight Loss: Gain Control of Emotional Eating - Tips to regain control of your eating habits. [facility])  Why Stress Causes People to Overeat -Tips on controlling stress eating. ([place])  Mindful Eating Meditations -Free online mindfulness meditations. ([employer])    Veggies are great because you can eat a ton! They are low in calories, great to fill you up, and have a  ton of vitamins, minerals, and protein.

## 2017-07-17 LAB — HEMOGLOBIN A1C
HEMOGLOBIN A1C: 6.4 %{Hb} — AB (ref <5.7)
Mean Plasma Glucose: 137 (calc)
eAG (mmol/L): 7.6 (calc)

## 2017-07-17 LAB — CBC WITH DIFFERENTIAL/PLATELET
BASOS PCT: 1 %
Basophils Absolute: 71 cells/uL (ref 0–200)
EOS ABS: 192 {cells}/uL (ref 15–500)
EOS PCT: 2.7 %
HCT: 36.8 % — ABNORMAL LOW (ref 38.5–50.0)
HEMOGLOBIN: 12.6 g/dL — AB (ref 13.2–17.1)
Lymphs Abs: 1995 cells/uL (ref 850–3900)
MCH: 32 pg (ref 27.0–33.0)
MCHC: 34.2 g/dL (ref 32.0–36.0)
MCV: 93.4 fL (ref 80.0–100.0)
MONOS PCT: 5.5 %
MPV: 9.4 fL (ref 7.5–12.5)
NEUTROS ABS: 4452 {cells}/uL (ref 1500–7800)
Neutrophils Relative %: 62.7 %
Platelets: 141 10*3/uL (ref 140–400)
RBC: 3.94 10*6/uL — ABNORMAL LOW (ref 4.20–5.80)
RDW: 13.2 % (ref 11.0–15.0)
Total Lymphocyte: 28.1 %
WBC mixed population: 391 cells/uL (ref 200–950)
WBC: 7.1 10*3/uL (ref 3.8–10.8)

## 2017-07-17 LAB — COMPLETE METABOLIC PANEL WITH GFR
AG Ratio: 1.9 (calc) (ref 1.0–2.5)
ALBUMIN MSPROF: 4.8 g/dL (ref 3.6–5.1)
ALT: 25 U/L (ref 9–46)
AST: 21 U/L (ref 10–35)
Alkaline phosphatase (APISO): 58 U/L (ref 40–115)
BUN / CREAT RATIO: 20 (calc) (ref 6–22)
BUN: 33 mg/dL — AB (ref 7–25)
CALCIUM: 10.3 mg/dL (ref 8.6–10.3)
CO2: 22 mmol/L (ref 20–32)
CREATININE: 1.66 mg/dL — AB (ref 0.70–1.18)
Chloride: 108 mmol/L (ref 98–110)
GFR, EST AFRICAN AMERICAN: 46 mL/min/{1.73_m2} — AB (ref >=60)
GFR, Est Non African American: 39 mL/min/{1.73_m2} — ABNORMAL LOW (ref >=60)
GLUCOSE: 156 mg/dL — AB (ref 65–99)
Globulin: 2.5 g/dL (calc) (ref 1.9–3.7)
Potassium: 4.6 mmol/L (ref 3.5–5.3)
Sodium: 142 mmol/L (ref 135–146)
Total Bilirubin: 0.7 mg/dL (ref 0.2–1.2)
Total Protein: 7.3 g/dL (ref 6.1–8.1)

## 2017-07-17 LAB — LIPID PANEL
Cholesterol: 195 mg/dL (ref <200)
HDL: 30 mg/dL — ABNORMAL LOW (ref >40)
LDL CHOLESTEROL (CALC): 119 mg/dL — AB
NON-HDL CHOLESTEROL (CALC): 165 mg/dL — AB (ref <130)
TRIGLYCERIDES: 323 mg/dL — AB (ref <150)
Total CHOL/HDL Ratio: 6.5 (calc) — ABNORMAL HIGH (ref <5.0)

## 2017-07-17 LAB — TSH: TSH: 1.71 m[IU]/L (ref 0.40–4.50)

## 2017-07-18 ENCOUNTER — Encounter: Payer: Self-pay | Admitting: *Deleted

## 2017-07-18 ENCOUNTER — Telehealth: Payer: Self-pay | Admitting: Internal Medicine

## 2017-07-18 NOTE — Telephone Encounter (Signed)
Calling for labs, explained delay. Please return call w/ results

## 2017-07-18 NOTE — Telephone Encounter (Signed)
LVM for patient to return phone call for lab results

## 2017-07-31 ENCOUNTER — Other Ambulatory Visit: Payer: Self-pay | Admitting: Internal Medicine

## 2017-08-03 ENCOUNTER — Encounter: Payer: Self-pay | Admitting: Gastroenterology

## 2017-08-05 ENCOUNTER — Other Ambulatory Visit: Payer: Self-pay | Admitting: Internal Medicine

## 2017-08-17 ENCOUNTER — Encounter: Payer: Self-pay | Admitting: Gastroenterology

## 2017-08-21 ENCOUNTER — Ambulatory Visit: Payer: Medicare Other | Admitting: Surgery

## 2017-08-21 ENCOUNTER — Other Ambulatory Visit (HOSPITAL_COMMUNITY): Payer: Medicare Other

## 2017-08-22 ENCOUNTER — Other Ambulatory Visit: Payer: Self-pay

## 2017-08-27 ENCOUNTER — Other Ambulatory Visit: Payer: Self-pay | Admitting: Internal Medicine

## 2017-09-04 ENCOUNTER — Other Ambulatory Visit: Payer: Self-pay | Admitting: Surgery

## 2017-09-04 ENCOUNTER — Other Ambulatory Visit: Payer: Self-pay

## 2017-09-04 ENCOUNTER — Ambulatory Visit (HOSPITAL_COMMUNITY)
Admission: RE | Admit: 2017-09-04 | Discharge: 2017-09-04 | Disposition: A | Discharge status: Home / Self Care | Payer: Medicare Other | Source: Ambulatory Visit | Attending: Surgery | Admitting: Surgery

## 2017-09-04 ENCOUNTER — Encounter: Payer: Self-pay | Admitting: Surgery

## 2017-09-04 ENCOUNTER — Ambulatory Visit (INDEPENDENT_AMBULATORY_CARE_PROVIDER_SITE_OTHER): Payer: Medicare Other | Admitting: Surgery

## 2017-09-04 VITALS — BP 124/77 | HR 65 | Temp 97.4°F | Resp 18 | Ht 70.0 in | Wt 217.0 lb

## 2017-09-04 DIAGNOSIS — I723 Aneurysm of iliac artery: Secondary | ICD-10-CM

## 2017-09-04 NOTE — Progress Notes (Signed)
[facility] [place]  Patient name: Joshua Peterson MRN: [id number] DOB: 04/22/1940 Sex: male   REASON FOR VISIT:    Follow up   HISOTRY OF PRESENT ILLNESS:    Joshua Peterson is a 77 y.o. male who returns today for follow-up of his right common iliac artery aneurysm that was found incidentally on a CT scan in 2018 for hematuria.  At that time, maximum diameter was 2 cm.  The patient does have bladder stones.  He is a diabetic which is well controlled.  He has a history of DVT in the remote past.  He is medically managed for hypertension  He has no complaints today  PAST MEDICAL HISTORY:   Past Medical History:  Diagnosis Date  . Allergic rhinitis   . Bladder stone   . CKD (chronic kidney disease), stage II   . External carotid artery stenosis    PT ASYMPTOMATIC per last duplex 05-18-2011  right moderate stenosis and left moderate to severe  . Gross hematuria   . History of adenomatous polyp of colon    2006;  2009 and 2014 tubular adenoma's  . History of DVT in adulthood    08/ 2009 right lower extremity;  2013  left lower extremity  . History of pulmonary embolism    bilatera lung  08/ 2009  . Hypertension   . Hypogonadism male   . Internal carotid artery stenosis, bilateral    PT ASYMPTOMATIC per last duplex 05-18-2011  bilateral 40-59% stenosis  . Lower urinary tract symptoms (LUTS)   . Mixed hyperlipidemia   . Right nephrolithiasis 06/20/2016  . Type 2 diabetes mellitus ([place])   . Vitamin D deficiency   . Wears glasses      FAMILY HISTORY:   Family History  Problem Relation Age of Onset  . Alzheimer's disease Mother   . Diabetes Mother   . Prostate cancer Father   . Breast cancer Sister   . Colon cancer Neg Hx   . Rectal cancer Neg Hx   . Stomach cancer Neg Hx     SOCIAL HISTORY:   Social History   Tobacco Use  . Smoking status: Former Smoker    Packs/day: 1.00    Years: 20.00    Pack years:  20.00    Types: Cigarettes    Last attempt to quit: 04/16/1991    Years since quitting: 26.4  . Smokeless tobacco: Never Used  Substance Use Topics  . Alcohol use: Yes    Alcohol/week: 1.2 oz    Types: 2 Cans of beer per week    Comment: ocassional (1-2 x a week)     ALLERGIES:   Allergies  Allergen Reactions  . Doxycycline   . Niacin And Related     flushing  . Sulfa Antibiotics Itching     CURRENT MEDICATIONS:   Current Outpatient Medications  Medication Sig Dispense Refill  . aspirin 81 MG tablet Take 81 mg by mouth daily.    . Cholecalciferol (VITAMIN D3) 5000 units CAPS Take 1 capsule by mouth daily.    . Cinnamon 500 MG capsule Take 100 mg by mouth 2 (two) times daily.    . citalopram (CELEXA) 20 MG tablet Take 1 tablet (20 mg total) by mouth daily. 90 tablet 3  . fluticasone (FLONASE) 50 MCG/ACT nasal spray USE 2 SPRAYS IN EACH NOSTRIL DAILY AS NEEDED 48 g 3  . glucose blood (ONE TOUCH ULTRA TEST) test strip USE TO CHECK BLOOD  SUGAR 1 TIME EVERY DAY-DX-E11.22. 100 each 2  . losartan (COZAAR) 100 MG tablet TAKE 1/2 TO 1 TABLET BY MOUTH DAILY FOR BP. 90 tablet 1  . meclizine (ANTIVERT) 25 MG tablet 1/2-1 pill up to 3 times daily for motion sickness/dizziness 30 tablet 0  . metFORMIN (GLUCOPHAGE-XR) 500 MG 24 hr tablet TAKE 2 TABLET BY MOUTH 2 TIMES DAILY WITH THE LARGEST MEALS. 360 tablet 1  . Multiple Vitamins-Minerals (MEGA MULTIVITAMIN FOR MEN) TABS Take 1 tablet by mouth daily.    . tamsulosin (FLOMAX) 0.4 MG CAPS capsule TAKE ONE CAPSULE BY MOUTH EVERY DAY 90 capsule 3  . terbinafine (LAMISIL) 250 MG tablet Take 1 tablet (250 mg total) by mouth daily. Take one daily for 3 months, need liver function lab at 6 weeks. 90 tablet 0  . azithromycin (ZITHROMAX) 250 MG tablet TAKE 2 TABLETS BY MOUTH TODAY, THEN TAKE 1 TABLET DAILY FOR 4 DAYS  0   No current facility-administered medications for this visit.     REVIEW OF SYSTEMS:   [X]  denotes positive finding, [ ]   denotes negative finding Cardiac  Comments:  Chest pain or chest pressure:    Shortness of breath upon exertion:    Short of breath when lying flat:    Irregular heart rhythm:        Vascular    Pain in calf, thigh, or hip brought on by ambulation:    Pain in feet at night that wakes you up from your sleep:     Blood clot in your veins:    Leg swelling:         Pulmonary    Oxygen at home:    Productive cough:     Wheezing:         Neurologic    Sudden weakness in arms or legs:     Sudden numbness in arms or legs:     Sudden onset of difficulty speaking or slurred speech:    Temporary loss of vision in one eye:     Problems with dizziness:         Gastrointestinal    Blood in stool:     Vomited blood:         Genitourinary    Burning when urinating:     Blood in urine:        Psychiatric    Major depression:         Hematologic    Bleeding problems:    Problems with blood clotting too easily:        Skin    Rashes or ulcers:        Constitutional    Fever or chills:      PHYSICAL EXAM:   Vitals:   09/04/17 0854  BP: 124/77  Pulse: 65  Resp: 18  Temp: (!) 97.4 F (36.3 C)  TempSrc: Oral  SpO2: 99%  Weight: 217 lb (98.4 kg)  Height: 5\' 10"  (1.778 m)    GENERAL: The patient is a well-nourished male, in no acute distress. The vital signs are documented above. CARDIAC: There is a regular rate and rhythm.  VASCULAR: No carotid bruits. PULMONARY: Non-labored respirations ABDOMEN: Soft and non-tender.  No pulsatile mass MUSCULOSKELETAL: There are no major deformities or cyanosis. NEUROLOGIC: No focal weakness or paresthesias are detected. SKIN: There are no ulcers or rashes noted. PSYCHIATRIC: The patient has a normal affect.  STUDIES:   I have ordered and reviewed his aortoiliac duplex.  Maximum aortic diameter  is 2.3 cm.  Right common iliac measures 2.0 cm.  The left is 1.4  MEDICAL ISSUES:   Right common iliac aneurysm: I discussed the  ultrasound findings today with the patient.  I told him that we would consider repair once his aneurysm gets to be greater than 3 cm.  He will continue with annual surveillance.    Annamarie Major, MD [facility] [phone] Pager [phone]

## 2017-10-03 ENCOUNTER — Telehealth: Payer: Self-pay | Admitting: *Deleted

## 2017-10-03 NOTE — Telephone Encounter (Signed)
Patient called and states he has been having diarrhea, since starting Terbinafine. Per Dr Melford Aase, he can try Imodium, up to 12 tablets daily, if needed.

## 2017-10-10 ENCOUNTER — Ambulatory Visit: Payer: Medicare Other | Admitting: Internal Medicine

## 2017-10-10 ENCOUNTER — Encounter: Payer: Self-pay | Admitting: Internal Medicine

## 2017-10-10 VITALS — BP 140/62 | HR 73 | Temp 97.8°F | Ht 70.0 in | Wt 225.0 lb

## 2017-10-10 DIAGNOSIS — N183 Chronic kidney disease, stage 3 unspecified: Secondary | ICD-10-CM

## 2017-10-10 DIAGNOSIS — E1122 Type 2 diabetes mellitus with diabetic chronic kidney disease: Secondary | ICD-10-CM | POA: Diagnosis not present

## 2017-10-10 DIAGNOSIS — E559 Vitamin D deficiency, unspecified: Secondary | ICD-10-CM | POA: Diagnosis not present

## 2017-10-10 DIAGNOSIS — E782 Mixed hyperlipidemia: Secondary | ICD-10-CM | POA: Diagnosis not present

## 2017-10-10 DIAGNOSIS — I10 Essential (primary) hypertension: Secondary | ICD-10-CM | POA: Diagnosis not present

## 2017-10-10 DIAGNOSIS — Z79899 Other long term (current) drug therapy: Secondary | ICD-10-CM

## 2017-10-10 DIAGNOSIS — K219 Gastro-esophageal reflux disease without esophagitis: Secondary | ICD-10-CM

## 2017-10-10 NOTE — Patient Instructions (Signed)

## 2017-10-10 NOTE — Progress Notes (Signed)
This very nice 77 y.o. MWM presents for 6 month follow up with HTN, HLD, Pre-Diabetes, GERD, BPH/LUTS and Vitamin D Deficiency. 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      Patient is treated for HTN (2004) & BP has been controlled at home. Today's BP is at goal - 140/62. Patient has had no complaints of any cardiac type chest pain, palpitations, dyspnea / orthopnea / PND, dizziness, claudication, or dependent edema.     Hyperlipidemia was not controlled with diet & was off meds. Patient denies myalgias or other med SE's. Current  Lipids were not at goal: Lab Results  Component Value Date   CHOL 167 10/10/2017   HDL 25 (L) 10/10/2017   LDLCALC LDL cholesterol not calculated.  10/10/2017   TRIG 476 (H) 10/10/2017   CHOLHDL 6.7 (H) 10/10/2017      Also, the patient has history of T2_NIDDM (2005) w/CKD (GFR 37) and has had no symptoms of reactive hypoglycemia, diabetic polys, paresthesias or visual blurring.  Last A1c was not at goal: Lab Results  Component Value Date   HGBA1C 6.1 (H) 10/10/2017      Further, the patient also has history of Vitamin D Deficiency ("24"/2008)  and sporadically supplements vitamin D. Last vitamin D was still low:  Lab Results  Component Value Date   VD25OH 30 10/10/2017   Current Outpatient Medications on File Prior to Visit  Medication Sig  . aspirin 81 MG tablet Take 81 mg by mouth daily.  . Cholecalciferol (VITAMIN D3) 5000 units CAPS Take 1 capsule by mouth daily.  . Cinnamon 500 MG capsule Take 100 mg by mouth 2 (two) times daily.  . citalopram (CELEXA) 20 MG tablet Take 1 tablet (20 mg total) by mouth daily. (Patient not taking: Reported on 10/11/2017)  . fluticasone (FLONASE) 50 MCG/ACT nasal spray USE 2 SPRAYS IN EACH NOSTRIL DAILY AS NEEDED  . glucose blood (ONE TOUCH ULTRA TEST) test strip USE TO CHECK BLOOD SUGAR 1 TIME EVERY DAY-DX-E11.22.  . losartan (COZAAR) 100 MG  tablet TAKE 1/2 TO 1 TABLET BY MOUTH DAILY FOR BP.  . meclizine (ANTIVERT) 25 MG tablet 1/2-1 pill up to 3 times daily for motion sickness/dizziness  . metFORMIN (GLUCOPHAGE-XR) 500 MG 24 hr tablet TAKE 2 TABLET BY MOUTH 2 TIMES DAILY WITH THE LARGEST MEALS.  . Multiple Vitamins-Minerals (MEGA MULTIVITAMIN FOR MEN) TABS Take 1 tablet by mouth daily.  . tamsulosin (FLOMAX) 0.4 MG CAPS capsule TAKE ONE CAPSULE BY MOUTH EVERY DAY   No current facility-administered medications on file prior to visit.    Allergies  Allergen Reactions  . Doxycycline   . Niacin And Related     flushing  . Sulfa Antibiotics Itching   PMHx:   Past Medical History:  Diagnosis Date  . Allergic rhinitis   . Allergy   . Bladder stone   . CKD (chronic kidney disease), stage II   . Clotting disorder ([place])   . External carotid artery stenosis    PT ASYMPTOMATIC per last duplex 05-18-2011  right moderate stenosis and left moderate to severe  . Gross hematuria   . History of adenomatous polyp of colon    2006;  2009 and 2014 tubular adenoma's  . History of DVT in adulthood    08/ 2009 right lower extremity;  2013  left lower extremity  . History of pulmonary embolism  bilatera lung  08/ 2009  . Hypertension   . Hypogonadism male   . Internal carotid artery stenosis, bilateral    PT ASYMPTOMATIC per last duplex 05-18-2011  bilateral 40-59% stenosis  . Lower urinary tract symptoms (LUTS)   . Mixed hyperlipidemia   . Right nephrolithiasis 06/20/2016  . Type 2 diabetes mellitus ([place])   . Vitamin D deficiency   . Wears glasses    Immunization History  Administered Date(s) Administered  . Influenza, High Dose Seasonal PF 01/14/2015, 11/26/2015, 12/29/2016  . Pneumococcal Conjugate-13 01/14/2015  . Pneumococcal-Unspecified 04/02/2008  . Td 04/02/2006, 12/29/2016  . Zoster 04/03/2007   Past Surgical History:  Procedure Laterality Date  . COLONOSCOPY  last one 07-12-2012  . CYSTOSCOPY WITH LITHOLAPAXY N/A  07/18/2016   Procedure: CYSTOSCOPY WITH LITHOLAPAXY;  Surgeon: Kathie Rhodes, MD;  [facility];  Service: Urology;  Laterality: N/A;  . IVC FILTER INSERTION  11/21/2007  . IVC FILTER REMOVAL  01/31/2008  . 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 RIGHT CHEEK/MOUTH BIOPSY  09/16/2008  (office)   squamous hyperplasia w/ hyperkeratosis  . RIGHT DEEP CERVICAL NODE BIOPSY  01/03/2001   right posterior neck (per path report-- lymphoid hyperplasia w/ florid follicular hyperplasia, no malignancy(  . TONSILLECTOMY  age 80   FHx:    Reviewed / unchanged  SHx:    Reviewed / unchanged   Systems Review:  Constitutional: Denies fever, chills, wt changes, headaches, insomnia, fatigue, night sweats, change in appetite. Eyes: Denies redness, blurred vision, diplopia, discharge, itchy, watery eyes.  ENT: Denies discharge, congestion, post nasal drip, epistaxis, sore throat, earache, hearing loss, dental pain, tinnitus, vertigo, sinus pain, snoring.  CV: Denies chest pain, palpitations, irregular heartbeat, syncope, dyspnea, diaphoresis, orthopnea, PND, claudication or edema. Respiratory: denies cough, dyspnea, DOE, pleurisy, hoarseness, laryngitis, wheezing.  Gastrointestinal: Denies dysphagia, odynophagia, heartburn, reflux, water brash, abdominal pain or cramps, nausea, vomiting, bloating, diarrhea, constipation, hematemesis, melena, hematochezia  or hemorrhoids. Genitourinary: Denies dysuria, frequency, urgency, nocturia, hesitancy, discharge, hematuria or flank pain. Musculoskeletal: Denies arthralgias, myalgias, stiffness, jt. swelling, pain, limping or strain/sprain.  Skin: Denies pruritus, rash, hives, warts, acne, eczema or change in skin lesion(s). Neuro: No weakness, tremor, incoordination, spasms, paresthesia or pain. Psychiatric: Denies confusion, memory loss or sensory loss. Endo: Denies change in weight, skin or hair change.  Heme/Lymph: No excessive  bleeding, bruising or enlarged lymph nodes.  Physical Exam  BP 140/62   Pulse 73   Temp 97.8 F (36.6 C)   Ht 5\' 10"  (1.778 m)   Wt 225 lb (102.1 kg)   SpO2 96%   BMI 32.28 kg/m   Appears  over nourished, well groomed  and in no distress.  Eyes: PERRLA, EOMs, conjunctiva no swelling or erythema. Sinuses: No frontal/maxillary tenderness ENT/Mouth: EAC's clear, TM's nl w/o erythema, bulging. Nares clear w/o erythema, swelling, exudates. Oropharynx clear without erythema or exudates. Oral hygiene is good. Tongue normal, non obstructing. Hearing intact.  Neck: Supple. Thyroid not palpable. Car 2+/2+ without bruits, nodes or JVD. Chest: Respirations nl with BS clear & equal w/o rales, rhonchi, wheezing or stridor.  Cor: Heart sounds normal w/ regular rate and rhythm without sig. murmurs, gallops, clicks or rubs. Peripheral pulses normal and equal  without edema.  Abdomen: Soft & bowel sounds normal. Non-tender w/o guarding, rebound, hernias, masses or organomegaly.  Lymphatics: Unremarkable.  Musculoskeletal: Full ROM all peripheral extremities, joint stability, 5/5 strength and normal gait.  Skin: Warm, dry without exposed rashes,  lesions or ecchymosis apparent.  Neuro: Cranial nerves intact, reflexes equal bilaterally. Sensory-motor testing grossly intact. Tendon reflexes grossly intact.  Pysch: Alert & oriented x 3.  Insight and judgement nl & appropriate. No ideations.  Assessment and Plan:  1. Essential hypertension  - Continue medication, monitor blood pressure at home.  - Continue DASH diet.  Reminder to go to the ER if any CP,  SOB, nausea, dizziness, severe HA, changes vision/speech.  - CBC with Differential/Platelet - COMPLETE METABOLIC PANEL WITH GFR - Magnesium - TSH  2. Hyperlipidemia, mixed  - Continue diet/meds, exercise,& lifestyle modifications.  - Continue monitor periodic cholesterol/liver & renal functions   - Lipid panel - TSH  3. Type 2 diabetes  mellitus with stage 3 chronic kidney disease, w/o use of insulin (HCC)  - Continue diet, exercise, lifestyle modifications.  - Monitor appropriate labs.  - Hemoglobin A1c - Insulin, random  4. Vitamin D deficiency  - Continue supplementation.   - VITAMIN D 25 Hydroxyl  5. Gastroesophageal reflux disease  - CBC with Differential/Platelet  6. Medication management  - CBC with Differential/Platelet - COMPLETE METABOLIC PANEL WITH GFR - Magnesium - Lipid panel - TSH - Hemoglobin A1c - Insulin, random - VITAMIN D 25 Hydroxyl      Discussed  regular exercise, BP monitoring, weight control to achieve/maintain BMI less than 25 and discussed med and SE's. Recommended labs to assess and monitor clinical status with further disposition pending results of labs. Over 30 minutes of exam, counseling, chart review was performed.

## 2017-10-11 ENCOUNTER — Other Ambulatory Visit: Payer: Self-pay | Admitting: Internal Medicine

## 2017-10-11 ENCOUNTER — Ambulatory Visit (AMBULATORY_SURGERY_CENTER): Payer: Self-pay

## 2017-10-11 VITALS — Ht 70.0 in | Wt 224.8 lb

## 2017-10-11 DIAGNOSIS — Z86010 Personal history of colonic polyps: Secondary | ICD-10-CM

## 2017-10-11 LAB — LIPID PANEL
CHOL/HDL RATIO: 6.7 (calc) — AB (ref <5.0)
Cholesterol: 167 mg/dL (ref <200)
HDL: 25 mg/dL — AB (ref >40)
NON-HDL CHOLESTEROL (CALC): 142 mg/dL — AB (ref <130)
Triglycerides: 476 mg/dL — ABNORMAL HIGH (ref <150)

## 2017-10-11 LAB — CBC WITH DIFFERENTIAL/PLATELET
BASOS PCT: 0.9 %
Basophils Absolute: 53 cells/uL (ref 0–200)
Eosinophils Absolute: 218 cells/uL (ref 15–500)
Eosinophils Relative: 3.7 %
HEMATOCRIT: 36.2 % — AB (ref 38.5–50.0)
Hemoglobin: 12.5 g/dL — ABNORMAL LOW (ref 13.2–17.1)
LYMPHS ABS: 1646 {cells}/uL (ref 850–3900)
MCH: 32.4 pg (ref 27.0–33.0)
MCHC: 34.5 g/dL (ref 32.0–36.0)
MCV: 93.8 fL (ref 80.0–100.0)
MPV: 9.8 fL (ref 7.5–12.5)
Monocytes Relative: 3.9 %
NEUTROS ABS: 3752 {cells}/uL (ref 1500–7800)
Neutrophils Relative %: 63.6 %
Platelets: 126 10*3/uL — ABNORMAL LOW (ref 140–400)
RBC: 3.86 10*6/uL — AB (ref 4.20–5.80)
RDW: 13.1 % (ref 11.0–15.0)
Total Lymphocyte: 27.9 %
WBC: 5.9 10*3/uL (ref 3.8–10.8)
WBCMIX: 230 {cells}/uL (ref 200–950)

## 2017-10-11 LAB — MAGNESIUM: Magnesium: 1.8 mg/dL (ref 1.5–2.5)

## 2017-10-11 LAB — COMPLETE METABOLIC PANEL WITH GFR
AG RATIO: 2.2 (calc) (ref 1.0–2.5)
ALBUMIN MSPROF: 4.6 g/dL (ref 3.6–5.1)
ALT: 18 U/L (ref 9–46)
AST: 15 U/L (ref 10–35)
Alkaline phosphatase (APISO): 55 U/L (ref 40–115)
BUN / CREAT RATIO: 16 (calc) (ref 6–22)
BUN: 28 mg/dL — AB (ref 7–25)
CALCIUM: 9.4 mg/dL (ref 8.6–10.3)
CHLORIDE: 108 mmol/L (ref 98–110)
CO2: 20 mmol/L (ref 20–32)
Creat: 1.74 mg/dL — ABNORMAL HIGH (ref 0.70–1.18)
GFR, EST AFRICAN AMERICAN: 43 mL/min/{1.73_m2} — AB (ref >=60)
GFR, EST NON AFRICAN AMERICAN: 37 mL/min/{1.73_m2} — AB (ref >=60)
GLUCOSE: 137 mg/dL — AB (ref 65–99)
Globulin: 2.1 g/dL (calc) (ref 1.9–3.7)
POTASSIUM: 4.2 mmol/L (ref 3.5–5.3)
SODIUM: 140 mmol/L (ref 135–146)
TOTAL PROTEIN: 6.7 g/dL (ref 6.1–8.1)
Total Bilirubin: 0.5 mg/dL (ref 0.2–1.2)

## 2017-10-11 LAB — INSULIN, RANDOM: INSULIN: 44.9 u[IU]/mL — AB (ref 2.0–19.6)

## 2017-10-11 LAB — HEMOGLOBIN A1C
EAG (MMOL/L): 7.1 (calc)
HEMOGLOBIN A1C: 6.1 %{Hb} — AB (ref <5.7)
Mean Plasma Glucose: 128 (calc)

## 2017-10-11 LAB — VITAMIN D 25 HYDROXY (VIT D DEFICIENCY, FRACTURES): Vit D, 25-Hydroxy: 30 ng/mL (ref 30–100)

## 2017-10-11 LAB — TSH: TSH: 1.55 mIU/L (ref 0.40–4.50)

## 2017-10-11 MED ORDER — NA SULFATE-K SULFATE-MG SULF 17.5-3.13-1.6 GM/177ML PO SOLN: 1.0000 | Freq: Once | ORAL | 0 refills | Status: AC

## 2017-10-11 MED ORDER — FENOFIBRATE 145 MG PO TABS: ORAL_TABLET | ORAL | 3 refills | Status: DC

## 2017-10-11 NOTE — Progress Notes (Signed)
Denies allergies to eggs or soy products. Denies complication of anesthesia or sedation. Denies use of weight loss medication. Denies use of O2.   Emmi instructions declined.  

## 2017-10-12 ENCOUNTER — Encounter: Payer: Self-pay | Admitting: Gastroenterology

## 2017-10-14 ENCOUNTER — Encounter: Payer: Self-pay | Admitting: Internal Medicine

## 2017-10-25 ENCOUNTER — Ambulatory Visit (AMBULATORY_SURGERY_CENTER): Payer: Medicare Other | Admitting: Gastroenterology

## 2017-10-25 VITALS — BP 131/62 | HR 64 | Temp 97.8°F | Resp 10

## 2017-10-25 DIAGNOSIS — D122 Benign neoplasm of ascending colon: Secondary | ICD-10-CM | POA: Diagnosis not present

## 2017-10-25 DIAGNOSIS — Z86010 Personal history of colonic polyps: Secondary | ICD-10-CM

## 2017-10-25 DIAGNOSIS — D120 Benign neoplasm of cecum: Secondary | ICD-10-CM

## 2017-10-25 DIAGNOSIS — D125 Benign neoplasm of sigmoid colon: Secondary | ICD-10-CM

## 2017-10-25 MED ORDER — SODIUM CHLORIDE 0.9 % IV SOLN: 500.0000 mL | Freq: Once | INTRAVENOUS | Status: DC

## 2017-10-25 NOTE — Op Note (Signed)
[facility] Patient Name: Joshua Peterson Procedure Date: 10/25/2017 8:25 AM MRN: [id number] Endoscopist: Remo Lipps P. Havery Moros , MD Age: 77 Referring MD:  Date of Birth: 1940-11-04 Gender: Male Account #: [id number] Procedure:                Colonoscopy Indications:              Surveillance: Personal history of adenomatous                            polyps on last colonoscopy 5 years ago Medicines:                Monitored Anesthesia Care Procedure:                Pre-Anesthesia Assessment:                           - Prior to the procedure, a History and Physical                            was performed, and patient medications and                            allergies were reviewed. The patient's tolerance of                            previous anesthesia was also reviewed. The risks                            and benefits of the procedure and the sedation                            options and risks were discussed with the patient.                            All questions were answered, and informed consent                            was obtained. Prior Anticoagulants: The patient has                            taken no previous anticoagulant or antiplatelet                            agents. ASA Grade Assessment: II - A patient with                            mild systemic disease. After reviewing the risks                            and benefits, the patient was deemed in                            satisfactory condition to undergo the procedure.  After obtaining informed consent, the colonoscope                            was passed under direct vision. Throughout the                            procedure, the patient's blood pressure, pulse, and                            oxygen saturations were monitored continuously. The                            Colonoscope was introduced through the anus and                            advanced to the the  terminal ileum, with                            identification of the appendiceal orifice and IC                            valve. The colonoscopy was performed without                            difficulty. The patient tolerated the procedure                            well. The quality of the bowel preparation was                            good. The terminal ileum, ileocecal valve,                            appendiceal orifice, and rectum were photographed. Scope In: 8:36:56 AM Scope Out: 8:58:08 AM Scope Withdrawal Time: 0 hours 18 minutes 10 seconds  Total Procedure Duration: 0 hours 21 minutes 12 seconds  Findings:                 The perianal and digital rectal examinations were                            normal.                           The terminal ileum appeared normal.                           Two sessile polyps were found in the cecum. The                            polyps were 3 to 4 mm in size. These polyps were                            removed with a cold snare. Resection and retrieval  were complete.                           Four sessile polyps were found in the ascending                            colon. The polyps were 3 to 5 mm in size. These                            polyps were removed with a cold snare. Resection                            and retrieval were complete.                           Three sessile polyps were found in the sigmoid                            colon. The polyps were 3 to 4 mm in size. These                            polyps were removed with a cold snare. Resection                            and retrieval were complete.                           Internal hemorrhoids were found during retroflexion.                           The exam was otherwise without abnormality. Complications:            No immediate complications. Estimated blood loss:                            Minimal. Estimated Blood Loss:      Estimated blood loss was minimal. Impression:               - Two 3 to 4 mm polyps in the cecum, removed with a                            cold snare. Resected and retrieved.                           - Four 3 to 5 mm polyps in the ascending colon,                            removed with a cold snare. Resected and retrieved.                           - Three 3 to 4 mm polyps in the sigmoid colon,                            removed with a  cold snare. Resected and retrieved.                           - Internal hemorrhoids.                           - The examination was otherwise normal. Recommendation:           - Patient has a contact number available for                            emergencies. The signs and symptoms of potential                            delayed complications were discussed with the                            patient. Return to normal activities tomorrow.                            Written discharge instructions were provided to the                            patient.                           - Resume previous diet.                           - Continue present medications.                           - Await pathology results. Remo Lipps P. Armbruster, MD 10/25/2017 9:03:07 AM This report has been signed electronically.

## 2017-10-25 NOTE — Patient Instructions (Signed)
Await pathology report on polyps removed   Information on polyps and hemorrhoids given to you today   YOU HAD AN ENDOSCOPIC PROCEDURE TODAY AT [facility]:   Refer to the procedure report that was given to you for any specific questions about what was found during the examination.  If the procedure report does not answer your questions, please call your gastroenterologist to clarify.  If you requested that your care partner not be given the details of your procedure findings, then the procedure report has been included in a sealed envelope for you to review at your convenience later.  YOU SHOULD EXPECT: Some feelings of bloating in the abdomen. Passage of more gas than usual.  Walking can help get rid of the air that was put into your GI tract during the procedure and reduce the bloating. If you had a lower endoscopy (such as a colonoscopy or flexible sigmoidoscopy) you may notice spotting of blood in your stool or on the toilet paper. If you underwent a bowel prep for your procedure, you may not have a normal bowel movement for a few days.  Please Note:  You might notice some irritation and congestion in your nose or some drainage.  This is from the oxygen used during your procedure.  There is no need for concern and it should clear up in a day or so.  SYMPTOMS TO REPORT IMMEDIATELY:   Following lower endoscopy (colonoscopy or flexible sigmoidoscopy):  Excessive amounts of blood in the stool  Significant tenderness or worsening of abdominal pains  Swelling of the abdomen that is new, acute  Fever of 100F or higher   Following upper endoscopy (EGD)  Vomiting of blood or coffee ground material  New chest pain or pain under the shoulder blades  Painful or persistently difficult swallowing  New shortness of breath  Fever of 100F or higher  Black, tarry-looking stools  For urgent or emergent issues, a gastroenterologist can be reached at any hour by calling [phone]  [phone].   DIET:  We do recommend a small meal at first, but then you may proceed to your regular diet.  Drink plenty of fluids but you should avoid alcoholic beverages for 24 hours.  ACTIVITY:  You should plan to take it easy for the rest of today and you should NOT DRIVE or use heavy machinery until tomorrow (because of the sedation medicines used during the test).    FOLLOW UP: Our staff will call the number listed on your records the next business day following your procedure to check on you and address any questions or concerns that you may have regarding the information given to you following your procedure. If we do not reach you, we will leave a message.  However, if you are feeling well and you are not experiencing any problems, there is no need to return our call.  We will assume that you have returned to your regular daily activities without incident.  If any biopsies were taken you will be contacted by phone or by letter within the next 1-3 weeks.  Please call us at [phone] if you have not heard about the biopsies in 3 weeks.    SIGNATURES/CONFIDENTIALITY: You and/or your care partner have signed paperwork which will be entered into your electronic medical record.  These signatures attest to the fact that that the information above on your After Visit Summary has been reviewed and is understood.  Full responsibility of the confidentiality of this  discharge information lies with you and/or your care-partner. 

## 2017-10-25 NOTE — Progress Notes (Signed)
Called to room to assist during endoscopic procedure.  Patient ID and intended procedure confirmed with present staff. Received instructions for my participation in the procedure from the performing physician.  

## 2017-10-25 NOTE — Progress Notes (Signed)
Report to PACU, RN, vss, BBS= Clear.  

## 2017-10-25 NOTE — Progress Notes (Signed)
Pt's states no medical or surgical changes since previsit or office visit. 

## 2017-10-26 ENCOUNTER — Telehealth: Payer: Self-pay

## 2017-10-26 NOTE — Telephone Encounter (Signed)
  Follow up Call-  Call back number 10/25/2017  Post procedure Call Back phone  # [phone]  Permission to leave phone message Yes  Some recent data might be hidden     Patient questions:  Do you have a fever, pain , or abdominal swelling? No. Pain Score  0 *  Have you tolerated food without any problems? Yes.    Have you been able to return to your normal activities? Yes.    Do you have any questions about your discharge instructions: Diet   No. Medications  No. Follow up visit  No.  Do you have questions or concerns about your Care? No.  Actions: * If pain score is 4 or above: No action needed, pain <4.

## 2017-10-31 ENCOUNTER — Encounter: Payer: Self-pay | Admitting: Gastroenterology

## 2017-11-17 ENCOUNTER — Other Ambulatory Visit: Payer: Self-pay | Admitting: Internal Medicine

## 2018-01-10 DIAGNOSIS — E119 Type 2 diabetes mellitus without complications: Secondary | ICD-10-CM

## 2018-01-10 DIAGNOSIS — E1122 Type 2 diabetes mellitus with diabetic chronic kidney disease: Secondary | ICD-10-CM | POA: Insufficient documentation

## 2018-01-10 NOTE — Progress Notes (Signed)
FOLLOW UP  Assessment and Plan:   Hypertension Well controlled with current medications  Monitor blood pressure at home; patient to call if consistently greater than 130/80 Continue DASH diet.   Reminder to go to the ER if any CP, SOB, nausea, dizziness, severe HA, changes vision/speech, left arm numbness and tingling and jaw pain.  Cholesterol Currently with severely elevated trigs, newly on fenofibrate Continue low cholesterol diet and exercise.  Check lipid panel.   Diabetes with diabetic chronic kidney disease Continue medication: metformin Continue diet and exercise.  Perform daily foot/skin check, notify office of any concerning changes.  Check A1C  Obesity with co morbidities Long discussion about weight loss, diet, and exercise Recommended diet heavy in fruits and veggies and low in animal meats, cheeses, and dairy products, appropriate calorie intake Discussed ideal weight for height (below 180 lb) and initial weight goal (215lb) Patient will work on lower carb diet - suggested low GI, modified keto, information provided for eating out Keep a daily low of food and bring to next appoint Will follow up in 3 months  Vitamin D Def Below goal at last visit; he has changed dose continue supplementation to maintain goal of 70-100 Check Vit D level  Continue diet and meds as discussed. Further disposition pending results of labs. Discussed med's effects and SE's.   Over 30 minutes of exam, counseling, chart review, and critical decision making was performed.   Future Appointments  Date Time Provider [place]  01/11/2018 10:45 AM Liane Comber, NP [facility] None  04/30/2018 10:00 AM Unk Pinto, MD [facility] None  07/18/2018 11:15 AM Vicie Mutters, PA-C [facility] None    ----------------------------------------------------------------------------------------------------------------------  HPI 77 y.o. male  presents for 3 month follow up on  hypertension, cholesterol, diabetes, obesity and vitamin D deficiency.   BMI is Body mass index is 33.15 kg/m., he has not been working on diet and exercise, admits he is stressed and eating entirely too much, struggling with appetite. Eating out every evening and eating late due to his wife. He is interested in the keto diet.  Wt Readings from Last 3 Encounters:  01/11/18 231 lb (104.8 kg)  10/11/17 224 lb 12.8 oz (102 kg)  10/10/17 225 lb (102.1 kg)   His blood pressure has been controlled at home, today their BP is BP: 118/68  He does not workout. He denies chest pain, shortness of breath, dizziness.   He is on cholesterol medication Fenofibrate and fish oil and denies myalgias. His cholesterol is not at goal. The cholesterol last visit was:   Lab Results  Component Value Date   CHOL 167 10/10/2017   HDL 25 (L) 10/10/2017   [place]  10/10/2017     Comment:     . LDL cholesterol not calculated. Triglyceride levels greater than 400 mg/dL invalidate calculated LDL results. . Reference range: <100 . Desirable range <100 mg/dL for primary prevention;   <70 mg/dL for patients with CHD or diabetic patients  with > or = 2 CHD risk factors. Marland Kitchen LDL-C is now calculated using the Martin-Hopkins  calculation, which is a validated novel method providing  better accuracy than the Friedewald equation in the  estimation of LDL-C.  Cresenciano Genre et al. Annamaria Helling. 2263;335(45): 2061-2068  (http://education.[employer].com/faq/FAQ164)    TRIG 476 (H) 10/10/2017   CHOLHDL 6.7 (H) 10/10/2017    He has not been working on diet and exercise for T2DM on metformin 1000 mg BID and cinnamon supplemtn, and denies hypoglycemia , nausea, paresthesia of the  feet, polydipsia, polyuria, visual disturbances and vomiting. Last A1C in the office was:  Lab Results  Component Value Date   HGBA1C 6.1 (H) 10/10/2017   Patient is on Vitamin D supplement.   Lab Results  Component Value Date   VD25OH 30 10/10/2017         Current Medications:  Current Outpatient Medications on File Prior to Visit  Medication Sig  . aspirin 81 MG tablet Take 81 mg by mouth daily.  . Cholecalciferol (VITAMIN D3) 5000 units CAPS Take 1 capsule by mouth daily.  . Cinnamon 500 MG capsule Take 100 mg by mouth 2 (two) times daily.  . citalopram (CELEXA) 20 MG tablet Take 1 tablet (20 mg total) by mouth daily.  . fenofibrate (TRICOR) 145 MG tablet Take 1 tablet daily for Blood Fats  . fluticasone (FLONASE) 50 MCG/ACT nasal spray USE 2 SPRAYS IN EACH NOSTRIL DAILY AS NEEDED  . glucose blood (ONE TOUCH ULTRA TEST) test strip USE TO CHECK BLOOD SUGAR 1 TIME EVERY DAY-[id number].  . losartan (COZAAR) 100 MG tablet TAKE 1/2 TO 1 TABLET BY MOUTH DAILY FOR BP.  . metFORMIN (GLUCOPHAGE-XR) 500 MG 24 hr tablet TAKE 2 TABLET BY MOUTH 2 TIMES DAILY WITH THE LARGEST MEALS.  . Multiple Vitamins-Minerals (MEGA MULTIVITAMIN FOR MEN) TABS Take 1 tablet by mouth daily.  . tamsulosin (FLOMAX) 0.4 MG CAPS capsule TAKE ONE CAPSULE BY MOUTH EVERY DAY  . terbinafine (LAMISIL) 250 MG tablet Take 250 mg by mouth daily.   No current facility-administered medications on file prior to visit.      Allergies:  Allergies  Allergen Reactions  . Doxycycline   . Niacin And Related     flushing  . Sulfa Antibiotics Itching     Medical History:  Past Medical History:  Diagnosis Date  . Allergic rhinitis   . Allergy   . Bladder stone   . CKD (chronic kidney disease), stage II   . Clotting disorder ([place])   . External carotid artery stenosis    PT ASYMPTOMATIC per last duplex 05-18-2011  right moderate stenosis and left moderate to severe  . Gross hematuria   . History of adenomatous polyp of colon    2006;  2009 and 2014 tubular adenoma's  . History of DVT in adulthood    08/ 2009 right lower extremity;  2013  left lower extremity  . History of pulmonary embolism    bilatera lung  08/ 2009  . Hypertension   . Hypogonadism male   .  Internal carotid artery stenosis, bilateral    PT ASYMPTOMATIC per last duplex 05-18-2011  bilateral 40-59% stenosis  . Lower urinary tract symptoms (LUTS)   . Mixed hyperlipidemia   . Right nephrolithiasis 06/20/2016  . Type 2 diabetes mellitus ([place])   . Vitamin D deficiency   . Wears glasses    Family history- Reviewed and unchanged Social history- Reviewed and unchanged   Review of Systems:  Review of Systems  Constitutional: Negative for malaise/fatigue and weight loss.  HENT: Negative for hearing loss and tinnitus.   Eyes: Negative for blurred vision and double vision.  Respiratory: Negative for cough, shortness of breath and wheezing.   Cardiovascular: Negative for chest pain, palpitations, orthopnea, claudication and leg swelling.  Gastrointestinal: Negative for abdominal pain, blood in stool, constipation, diarrhea, heartburn, melena, nausea and vomiting.  Genitourinary: Negative.   Musculoskeletal: Negative for joint pain and myalgias.  Skin: Negative for rash.  Neurological: Negative for dizziness, tingling, sensory change,  weakness and headaches.  Endo/Heme/Allergies: Negative for polydipsia.  Psychiatric/Behavioral: Negative.   All other systems reviewed and are negative.    Physical Exam: BP 118/68   Pulse 78   Temp (!) 97.3 F (36.3 C)   Ht 5\' 10"  (1.778 m)   Wt 231 lb (104.8 kg)   SpO2 98%   BMI 33.15 kg/m  Wt Readings from Last 3 Encounters:  01/11/18 231 lb (104.8 kg)  10/11/17 224 lb 12.8 oz (102 kg)  10/10/17 225 lb (102.1 kg)   General Appearance: Well nourished, in no apparent distress. Eyes: PERRLA, EOMs, conjunctiva no swelling or erythema Sinuses: No Frontal/maxillary tenderness ENT/Mouth: Ext aud canals clear, TMs without erythema, bulging. No erythema, swelling, or exudate on post pharynx.  Tonsils not swollen or erythematous. Hearing normal.  Neck: Supple, thyroid normal.  Respiratory: Respiratory effort normal, BS equal bilaterally without  rales, rhonchi, wheezing or stridor.  Cardio: RRR with no MRGs. Brisk peripheral pulses without edema.  Abdomen: Soft, + BS.  Non tender, no guarding, rebound, hernias, masses. Lymphatics: Non tender without lymphadenopathy.  Musculoskeletal: Full ROM, 5/5 strength, Normal gait Skin: Warm, dry without rashes, lesions, ecchymosis.  Neuro: Cranial nerves intact. No cerebellar symptoms.  Psych: Awake and oriented X 3, normal affect, Insight and Judgment appropriate.    Izora Ribas, NP 10:35 AM [facility]

## 2018-01-11 ENCOUNTER — Ambulatory Visit: Payer: Medicare Other | Admitting: Adult Health

## 2018-01-11 ENCOUNTER — Encounter: Payer: Self-pay | Admitting: Adult Health

## 2018-01-11 VITALS — BP 118/68 | HR 78 | Temp 97.3°F | Ht 70.0 in | Wt 231.0 lb

## 2018-01-11 DIAGNOSIS — E1122 Type 2 diabetes mellitus with diabetic chronic kidney disease: Secondary | ICD-10-CM | POA: Diagnosis not present

## 2018-01-11 DIAGNOSIS — E669 Obesity, unspecified: Secondary | ICD-10-CM

## 2018-01-11 DIAGNOSIS — E785 Hyperlipidemia, unspecified: Secondary | ICD-10-CM

## 2018-01-11 DIAGNOSIS — E559 Vitamin D deficiency, unspecified: Secondary | ICD-10-CM

## 2018-01-11 DIAGNOSIS — E1169 Type 2 diabetes mellitus with other specified complication: Secondary | ICD-10-CM

## 2018-01-11 DIAGNOSIS — I10 Essential (primary) hypertension: Secondary | ICD-10-CM

## 2018-01-11 DIAGNOSIS — N182 Chronic kidney disease, stage 2 (mild): Secondary | ICD-10-CM

## 2018-01-11 DIAGNOSIS — D696 Thrombocytopenia, unspecified: Secondary | ICD-10-CM

## 2018-01-11 DIAGNOSIS — Z79899 Other long term (current) drug therapy: Secondary | ICD-10-CM

## 2018-01-11 NOTE — Patient Instructions (Addendum)
Goals    . Weight (lb) < 215 lb (97.5 kg)        Look into Low glycemic index diet   Recommend a modified keto or a low carb diet - no sugar, flour products, potatoes/high starch   Keep a daily log of food (or can take photos, log calories/macros on an app) and bring with you next visit  Start checking fasting sugars daily for accountibility   Dining out: help on how to choose  It is better if you can meal plan and eat at your house but sometimes life happens so here are some tips to help you make healthier choices while eating out:  1) Ask for your server to pack up 1/2 of your meal to take home for lunch or later. Portion sizes are huge so if you don't have all of it in front of you, you are less likely to eat it all.    2) Ask if they have a smaller portion or lunch portion available, this is often cheaper as well!  3) Ask to not have the bread basket brought to the table  4) Ask for the dressing on the side.   5) Look at the nutrition menu BEFORE you go to a restaurant or fast food chain and have what you will eat in mind. You can also look it up on food tracking ups like My[employer]itness Pal or [employer] it. However the web site for the restaurant is usually the best bet.   6) Don't forget that you are the costumer, it is okay to ask them to leave things out, ask about substituting, or ask them to cook with less oil!  Here is some more general information for you!

## 2018-01-12 ENCOUNTER — Other Ambulatory Visit: Payer: Self-pay | Admitting: Adult Health

## 2018-01-12 LAB — COMPLETE METABOLIC PANEL WITH GFR
AG Ratio: 1.9 (calc) (ref 1.0–2.5)
ALT: 29 U/L (ref 9–46)
AST: 14 U/L (ref 10–35)
Albumin: 4.7 g/dL (ref 3.6–5.1)
Alkaline phosphatase (APISO): 45 U/L (ref 40–115)
BUN/Creatinine Ratio: 19 (calc) (ref 6–22)
BUN: 32 mg/dL — ABNORMAL HIGH (ref 7–25)
CALCIUM: 10 mg/dL (ref 8.6–10.3)
CO2: 24 mmol/L (ref 20–32)
CREATININE: 1.66 mg/dL — AB (ref 0.70–1.18)
Chloride: 107 mmol/L (ref 98–110)
GFR, EST NON AFRICAN AMERICAN: 39 mL/min/{1.73_m2} — AB (ref >=60)
GFR, Est African American: 45 mL/min/{1.73_m2} — ABNORMAL LOW (ref >=60)
GLOBULIN: 2.5 g/dL (ref 1.9–3.7)
Glucose, Bld: 244 mg/dL — ABNORMAL HIGH (ref 65–99)
Potassium: 4.4 mmol/L (ref 3.5–5.3)
Sodium: 140 mmol/L (ref 135–146)
Total Bilirubin: 0.5 mg/dL (ref 0.2–1.2)
Total Protein: 7.2 g/dL (ref 6.1–8.1)

## 2018-01-12 LAB — CBC WITH DIFFERENTIAL/PLATELET
BASOS ABS: 73 {cells}/uL (ref 0–200)
Basophils Relative: 1.1 %
EOS PCT: 4.4 %
Eosinophils Absolute: 290 cells/uL (ref 15–500)
HEMATOCRIT: 36.7 % — AB (ref 38.5–50.0)
Hemoglobin: 12.6 g/dL — ABNORMAL LOW (ref 13.2–17.1)
LYMPHS ABS: 1802 {cells}/uL (ref 850–3900)
MCH: 32.6 pg (ref 27.0–33.0)
MCHC: 34.3 g/dL (ref 32.0–36.0)
MCV: 94.8 fL (ref 80.0–100.0)
MPV: 9.8 fL (ref 7.5–12.5)
Monocytes Relative: 5 %
NEUTROS ABS: 4105 {cells}/uL (ref 1500–7800)
Neutrophils Relative %: 62.2 %
Platelets: 141 10*3/uL (ref 140–400)
RBC: 3.87 10*6/uL — AB (ref 4.20–5.80)
RDW: 13.1 % (ref 11.0–15.0)
Total Lymphocyte: 27.3 %
WBC mixed population: 330 cells/uL (ref 200–950)
WBC: 6.6 10*3/uL (ref 3.8–10.8)

## 2018-01-12 LAB — VITAMIN D 25 HYDROXY (VIT D DEFICIENCY, FRACTURES): VIT D 25 HYDROXY: 42 ng/mL (ref 30–100)

## 2018-01-12 LAB — LIPID PANEL
CHOLESTEROL: 191 mg/dL (ref <200)
HDL: 28 mg/dL — AB (ref >40)
LDL CHOLESTEROL (CALC): 108 mg/dL — AB
Non-HDL Cholesterol (Calc): 163 mg/dL (calc) — ABNORMAL HIGH (ref <130)
Total CHOL/HDL Ratio: 6.8 (calc) — ABNORMAL HIGH (ref <5.0)
Triglycerides: 385 mg/dL — ABNORMAL HIGH (ref <150)

## 2018-01-12 LAB — HEMOGLOBIN A1C
Hgb A1c MFr Bld: 7.6 % of total Hgb — ABNORMAL HIGH (ref <5.7)
Mean Plasma Glucose: 171 (calc)
eAG (mmol/L): 9.5 (calc)

## 2018-01-12 LAB — TSH: TSH: 1.85 m[IU]/L (ref 0.40–4.50)

## 2018-01-12 LAB — MAGNESIUM: Magnesium: 1.8 mg/dL (ref 1.5–2.5)

## 2018-01-12 MED ORDER — ROSUVASTATIN CALCIUM 5 MG PO TABS: 5.0000 mg | ORAL_TABLET | Freq: Every day | ORAL | 1 refills | Status: DC

## 2018-01-12 MED ORDER — VITAMIN D3 125 MCG (5000 UT) PO CAPS: 2.0000 | ORAL_CAPSULE | Freq: Every day | ORAL | Status: AC

## 2018-04-30 ENCOUNTER — Encounter: Payer: Self-pay | Admitting: Internal Medicine

## 2018-05-09 ENCOUNTER — Ambulatory Visit: Payer: Self-pay | Admitting: Internal Medicine

## 2018-05-09 ENCOUNTER — Encounter: Payer: Self-pay | Admitting: Internal Medicine

## 2018-05-09 NOTE — Progress Notes (Signed)
    C  A  N  C  E  L  L  E D       A  M          of           A  P  P  'T                                                                                                                                                                                                                                                                       This very nice 78 y.o.male presents for a Screening /Preventative Visit & comprehensive evaluation and management of multiple medical co-morbidities.  Patient has been followed for HTN, HLD, T2_NIDDM  and Vitamin D Deficiency.     HTN predates since     . Patient's BP has been controlled at home.  Today's  . Patient denies any cardiac symptoms as chest pain, palpitations, shortness of breath, dizziness or ankle swelling.     Patient's hyperlipidemia is not controlled with diet and medications. Patient denies myalgias or other medication SE's. Last lipids were not at goal: Lab Results  Component Value Date   CHOL 191 01/11/2018   HDL 28 (L) 01/11/2018   LDLCALC 108 (H) 01/11/2018   TRIG 385 (H) 01/11/2018   CHOLHDL 6.8 (H) 01/11/2018      Patient has hx/o T2_NIDDM since    and patient denies reactive hypoglycemic symptoms, visual blurring, diabetic polys or paresthesias. Last A1c was  Lab Results  Component Value Date   HGBA1C 7.6 (H) 01/11/2018       Finally, patient has history of Vitamin D Deficiency of    and last vitamin D was still low: Lab Results  Component Value Date   VD25OH 42 01/11/2018

## 2018-05-29 ENCOUNTER — Other Ambulatory Visit: Payer: Self-pay | Admitting: Internal Medicine

## 2018-05-29 ENCOUNTER — Other Ambulatory Visit: Payer: Self-pay | Admitting: Adult Health

## 2018-05-30 ENCOUNTER — Other Ambulatory Visit: Payer: Self-pay | Admitting: Adult Health

## 2018-05-30 DIAGNOSIS — F32A Depression, unspecified: Secondary | ICD-10-CM

## 2018-05-30 DIAGNOSIS — F329 Major depressive disorder, single episode, unspecified: Secondary | ICD-10-CM

## 2018-06-16 ENCOUNTER — Other Ambulatory Visit: Payer: Self-pay | Admitting: Internal Medicine

## 2018-06-18 ENCOUNTER — Other Ambulatory Visit: Payer: Medicare Other | Admitting: Adult Health

## 2018-06-18 ENCOUNTER — Telehealth: Payer: Self-pay

## 2018-06-18 ENCOUNTER — Telehealth: Payer: Self-pay | Admitting: Adult Health

## 2018-06-18 ENCOUNTER — Encounter: Payer: Self-pay | Admitting: Adult Health

## 2018-06-18 DIAGNOSIS — J309 Allergic rhinitis, unspecified: Secondary | ICD-10-CM

## 2018-06-18 MED ORDER — FEXOFENADINE HCL 180 MG PO TABS: 180.0000 mg | ORAL_TABLET | Freq: Every day | ORAL | 2 refills | Status: DC

## 2018-06-18 MED ORDER — ALBUTEROL SULFATE HFA 108 (90 BASE) MCG/ACT IN AERS: 2.0000 | INHALATION_SPRAY | RESPIRATORY_TRACT | 0 refills | Status: DC | PRN

## 2018-06-18 MED ORDER — PREDNISONE 20 MG PO TABS: ORAL_TABLET | ORAL | 0 refills | Status: DC

## 2018-06-18 NOTE — Progress Notes (Signed)
Virtual Visit via Telephone Note  I connected with Joshua Peterson on 06/18/18 at  by telephone and verified that I am speaking with the correct person using two identifiers.   I discussed the limitations, risks, security and privacy concerns of performing an evaluation and management service by telephone and the availability of in person appointments. I also discussed with the patient that there may be a patient responsible charge related to this service. The patient expressed understanding and agreed to proceed.   History of Present Illness:   78 y.o. former smoker (20 pack year hx quit in 1993, last CXR 09/18/2015 normal) with hx of allergic rhinitis, htn, T2DM, GERD called reporting URI symptoms and requested telehealth visit and spoke with patient via telephone.   He reports sx began 1 month ago; began with sneezing, AM congestion, watery/itchy eyes and has developed to having a mild sore with productive of thick yellow secretions. He reports sx have been waxing and waning, worst in the morning, not notably progressive or significantly worse. He endorses mild headache only occasionally that quickly resolves with NSAID. He denies fever/chills at home. He denies sinus pain, ear pressure or pain, dizziness, chest pain, wheezing, n/v/d, rash, myalgia/arthralgia. He denies known sick contacts, covid 19 or otherwise, denies recent travel. He reports "this feels like my regular yearly infection." He does endorse mild dyspnea with walking the dog, but denies any accompaniments.   He has been taking  flonase and cold/sinus medication with ibuprofen + sudafed which has helped some with symptoms. He is not currently taking any oral antihistamine.   He did have flu vaccine this season.    Allergies, med list, problem list reviewed   Observations/Objective:  General: Well sounding male, in no apparent distress.  ENT/Mouth: No hoarseness, No cough for duration of visit.  Respiratory: Speaks in complete  sentences, no apparent distress  Neuro: Awake and oriented X 3,  Psych:  normal affect, Insight and Judgment appropriate.    Assessment and Plan:  Diagnoses and all orders for this visit:  Acute allergic rhinitis Discussed the importance of avoiding unnecessary antibiotic therapy. Suggested symptomatic OTC remedies. Nasal saline spray for congestion. Nasal steroids, start allergy pill, oral steroids offered Follow up as needed; call if developing fever, chest achiness, increasingly productive or persistent cough He has promethazine DM at home PRN cough -     fexofenadine (ALLEGRA) 180 MG tablet; Take 1 tablet (180 mg total) by mouth daily. -     predniSONE (DELTASONE) 20 MG tablet; 2 tablets daily for 3 days, 1 tablet daily for 4 days.  Other orders -     albuterol (VENTOLIN HFA) 108 (90 Base) MCG/ACT inhaler; Inhale 2 puffs into the lungs every 4 (four) hours as needed for wheezing or shortness of breath.   Follow Up Instructions:    I discussed the assessment and treatment plan with the patient. The patient was provided an opportunity to ask questions and all were answered. The patient agreed with the plan and demonstrated an understanding of the instructions.   The patient was advised to call back or seek an in-person evaluation if the symptoms worsen or if the condition fails to improve as anticipated.  I provided 15 minutes of non-face-to-face time during this encounter.   Izora Ribas, NP

## 2018-06-18 NOTE — Telephone Encounter (Signed)
Called patient to complete telephone based televisit. See note with orders for complete documentation.

## 2018-06-18 NOTE — Telephone Encounter (Signed)
Patient complaining of sore throat, nasal drainage, congestion, sneezing, watery/itchy eyes and has a cough. States that he is having shortness of breath. When coughing, mucus is yellowish. Cold and sinus meds have worked in the past. Please advise. Patient is ok to do phone visit.

## 2018-07-11 ENCOUNTER — Other Ambulatory Visit: Payer: Self-pay | Admitting: Adult Health

## 2018-07-12 ENCOUNTER — Other Ambulatory Visit: Payer: Self-pay | Admitting: Adult Health

## 2018-07-17 NOTE — Progress Notes (Signed)
MEDICARE ANNUAL WELLNESS VISIT AND FOLLOW UP Assessment:   Essential hypertension - continue medications, DASH diet, exercise and monitor at home. Call if greater than 130/80.  -     CBC with Differential/Platelet -     COMPLETE METABOLIC PANEL WITH GFR -     TSH  Aneurysm of left iliac artery (HCC) Control blood pressure, cholesterol, glucose, increase exercise.   CKD stage 2 due to type 2 diabetes mellitus ([place]) Discussed general issues about diabetes pathophysiology and management., Educational material distributed., Suggested low cholesterol diet., Encouraged aerobic exercise., Discussed foot care., Reminded to get yearly retinal exam. START TO CHECK SUGARS AT HOME -     TSH -     Hemoglobin A1c  Mixed hyperlipidemia -continue medications, check lipids, decrease fatty foods, increase activity.  -     Lipid panel  Thrombocytopenia ([place]) Will monitor -     CBC with Differential/Platelet  Recurrent major depressive disorder, in partial remission ([place]) - continue medications, stress management techniques discussed, increase water, good sleep hygiene discussed, increase exercise, and increase veggies.   Vitamin D deficiency Continue supplement  Medication management -     Hepatic function panel; Future  Testosterone deficiency Hypogonadism- continue replacement therapy, check testosterone levels as needed.   Class 1 obesity due to excess calories with serious comorbidity and body mass index (BMI) of 31.0 to 31.9 in adult - follow up 3 months for progress monitoring - increase veggies, decrease carbs - long discussion about weight loss, diet, and exercise  Encounter for Medicare annual wellness exam 1 year   Over 30 minutes of exam, counseling, chart review, and critical decision making was performed  Future Appointments  Date Time Provider [place]  11/01/2018  3:45 PM Unk Pinto, MD [facility] None  07/29/2019 11:15 AM Vicie Mutters, PA-C [facility]  None     Plan:   During the course of the visit the patient was educated and counseled about appropriate screening and preventive services including:    Pneumococcal vaccine   Influenza vaccine  Prevnar 13  Td vaccine  Screening electrocardiogram  Colorectal cancer screening  Diabetes screening  Glaucoma screening  Nutrition counseling    Subjective:  Joshua Peterson is a 78 y.o. male who presents for Medicare Annual Wellness Visit and 3 month follow up for HTN, hyperlipidemia, diabetes, and vitamin D Def.   His blood pressure has been controlled at home, today their BP is BP: 122/70 He does not workout. He denies chest pain, shortness of breath, dizziness.   Saw Urologist 2-3 weeks ago, Dr. Karsten Ro, has stones again and urinary frequency due to it, will need cystoscopy again after COVID.   BMI is Body mass index is 32.28 kg/m., he is working on diet and exercise. Wt Readings from Last 3 Encounters:  07/18/18 225 lb (102.1 kg)  01/11/18 231 lb (104.8 kg)  10/11/17 224 lb 12.8 oz (102 kg)   He has history of recurrent DVTs with travel. He is on cholesterol medication and denies myalgias. His cholesterol is not at goal. The cholesterol last visit was:   Lab Results  Component Value Date   CHOL 191 01/11/2018   HDL 28 (L) 01/11/2018   LDLCALC 108 (H) 01/11/2018   TRIG 385 (H) 01/11/2018   CHOLHDL 6.8 (H) 01/11/2018   He has been working on diet and exercise for Diabetes with diabetic chronic kidney disease, he is NOT checking his sugars,  he is on metformin 4 a day, and denies paresthesia of  the feet, polydipsia, polyuria and visual disturbances. Last A1C was:  Lab Results  Component Value Date   HGBA1C 7.6 (H) 01/11/2018   Last GFR Lab Results  Component Value Date   GFRNONAA 39 (L) 01/11/2018    Patient is on Vitamin D supplement.   Lab Results  Component Value Date   VD25OH 42 01/11/2018   Medication Review:  Current Outpatient Medications (Endocrine  & Metabolic):  .  metFORMIN (GLUCOPHAGE-XR) 500 MG 24 hr tablet, TAKE 2 TABLET BY MOUTH 2 TIMES DAILY WITH THE LARGEST MEALS. Marland Kitchen  predniSONE (DELTASONE) 20 MG tablet, 2 tablets daily for 3 days, 1 tablet daily for 4 days. (Patient not taking: Reported on 07/18/2018)  Current Outpatient Medications (Cardiovascular):  .  fenofibrate (TRICOR) 145 MG tablet, Take 1 tablet daily for Blood Fats .  losartan (COZAAR) 100 MG tablet, TAKE 1/2 TO 1 TABLET BY MOUTH DAILY FOR BP. .  rosuvastatin (CRESTOR) 5 MG tablet, TAKE 1 TABLET BY MOUTH EVERY DAY  Current Outpatient Medications (Respiratory):  .  fexofenadine (ALLEGRA) 180 MG tablet, Take 1 tablet (180 mg total) by mouth daily. .  fluticasone (FLONASE) 50 MCG/ACT nasal spray, USE 2 SPRAYS IN EACH NOSTRIL DAILY AS NEEDED .  PROAIR HFA 108 (90 Base) MCG/ACT inhaler, INHALE 2 PUFFS INTO THE LUNGS EVERY 4 (FOUR) HOURS AS NEEDED FOR WHEEZING OR SHORTNESS OF BREATH.  Current Outpatient Medications (Analgesics):  .  aspirin 81 MG tablet, Take 81 mg by mouth daily.   Current Outpatient Medications (Other):  Marland Kitchen  Cholecalciferol (VITAMIN D3) 5000 units CAPS, Take 2 capsules (10,000 Units total) by mouth daily. .  Cinnamon 500 MG capsule, Take 100 mg by mouth 2 (two) times daily. .  citalopram (CELEXA) 20 MG tablet, TAKE 1 TABLET BY MOUTH EVERY DAY .  famotidine (PEPCID) 40 MG tablet, Take 1 tablet (40 mg total) by mouth every evening. Marland Kitchen  glucose blood test strip, USE TO CHECK BLOOD SUGAR 1 TIME DAILY-DX-E11.22 .  Multiple Vitamins-Minerals (MEGA MULTIVITAMIN FOR MEN) TABS, Take 1 tablet by mouth daily. .  tamsulosin (FLOMAX) 0.4 MG CAPS capsule, TAKE ONE CAPSULE BY MOUTH EVERY DAY .  terbinafine (LAMISIL) 250 MG tablet, Take 250 mg by mouth daily.  Allergies: Allergies  Allergen Reactions  . Doxycycline   . Niacin And Related     flushing  . Sulfa Antibiotics Itching    Current Problems (verified) has History of DVT of lower extremity; CKD stage 2  due to type 2 diabetes mellitus ([place]); Hypertension; Hyperlipidemia associated with type 2 diabetes mellitus ([place]); Vitamin D deficiency; Testosterone deficiency; Obesity (BMI 30.0-34.9); Medication management; GERD ; Thrombocytopenia ([place]); Aneurysm of left iliac artery (HCC); Gallstones; Depression; and Type 2 diabetes mellitus ([place]) on their problem list.  Screening Tests Immunization History  Administered Date(s) Administered  . Influenza, High Dose Seasonal PF 01/14/2015, 11/26/2015, 12/29/2016, 12/18/2017  . Pneumococcal Conjugate-13 01/14/2015  . Pneumococcal-Unspecified 04/02/2008  . Td 04/02/2006, 12/29/2016  . Zoster 04/03/2007    Preventative care: Last colonoscopy: 10/2017 Dr. Havery Moros  TDAP 2018 CXR 2017  Names of Other Physician/Practitioners you currently use: 1. [facility] here for primary care 2. Dr. Delman Cheadle, eye doctor, last visit 2019 he is DUE will get next month 3. Dr. Mariea Clonts, dentist, last visit 5 months ago Patient Care Team: Unk Pinto, MD as PCP - General (Internal Medicine) Kathie Rhodes, MD as Consulting Physician (Urology)  Surgical: He  has a past surgical history that includes RIGHT DEEP  CERVICAL NODE BIOPSY (01/03/2001); RIGHT CHEEK/MOUTH BIOPSY (09/16/2008  (office)); Colonoscopy (last one 07-12-2012); IVC FILTER INSERTION (11/21/2007); IVC FILTER REMOVAL (01/31/2008); Tonsillectomy (age 53); Orchiopexy (Bilateral, 1950's); and Cystoscopy with litholapaxy (N/A, 07/18/2016). Family His family history includes Alzheimer's disease in his mother; Breast cancer in his sister; Diabetes in his mother; Prostate cancer in his father. Social history  He reports that he quit smoking about 27 years ago. His smoking use included cigarettes. He has a 20.00 pack-year smoking history. He has never used smokeless tobacco. He reports current alcohol use of about 2.0 standard drinks of alcohol per week. He reports that he does not  use drugs.  MEDICARE WELLNESS OBJECTIVES: Physical activity: Current Exercise Habits: The patient does not participate in regular exercise at present Cardiac risk factors: Cardiac Risk Factors include: advanced age (>52men, >62 women);diabetes mellitus;dyslipidemia;hypertension;male gender;sedentary lifestyle;obesity (BMI >30kg/m2) Depression/mood screen:   Depression screen [facility] 2/9 07/18/2018  Decreased Interest 0  Down, Depressed, Hopeless 0  PHQ - 2 Score 0    ADLs:  In your present state of health, do you have any difficulty performing the following activities: 07/18/2018 10/14/2017  Hearing? N N  Vision? N N  Difficulty concentrating or making decisions? N N  Walking or climbing stairs? N N  Dressing or bathing? N N  Doing errands, shopping? N N  Some recent data might be hidden     Cognitive Testing  Alert? Yes  Normal Appearance?Yes  Oriented to person? Yes  Place? Yes   Time? Yes  Recall of three objects?  Yes  Can perform simple calculations? Yes  Displays appropriate judgment?Yes  Can read the correct time from a watch face?Yes  EOL planning: Does Patient Have a Medical Advance Directive?: Yes Type of Advance Directive: Healthcare Power of Attorney, Living will Copy of [place] in Chart?: No - copy requested   Objective:   Today's Vitals   07/18/18 1128  BP: 122/70  Pulse: 86  Temp: 97.6 F (36.4 C)  SpO2: 97%  Weight: 225 lb (102.1 kg)  PainSc: 1    Body mass index is 32.28 kg/m.  Wt Readings from Last 3 Encounters:  07/18/18 225 lb (102.1 kg)  01/11/18 231 lb (104.8 kg)  10/11/17 224 lb 12.8 oz (102 kg)     General appearance: alert, no distress, WD/WN, male obese HEENT: normocephalic, sclerae anicteric, TMs pearly, nares patent, no discharge or erythema, pharynx normal.  Left eye with external hordeoleum.  No active drainage or discharge.  No conjunctival injections Oral cavity: MMM, no lesions Neck: supple, no lymphadenopathy,  no thyromegaly, no masses Heart: RRR, normal S1, S2, no murmurs Lungs: CTA bilaterally, no wheezes, rhonchi, or rales Abdomen: +bs, soft, non tender, non distended, no masses, no hepatomegaly, no splenomegaly Musculoskeletal: nontender, no swelling, no obvious deformity Extremities: no edema, no cyanosis, no clubbing Pulses: 2+ symmetric, upper and lower extremities, normal cap refill Neurological: alert, oriented x 3, CN2-12 intact, strength normal upper extremities and lower extremities, sensation normal throughout, DTRs 2+ throughout, no cerebellar signs, gait normal Psychiatric: normal affect, behavior normal, pleasant   Medicare Attestation I have personally reviewed: The patient's medical and social history Their use of alcohol, tobacco or illicit drugs Their current medications and supplements The patient's functional ability including ADLs,fall risks, home safety risks, cognitive, and hearing and visual impairment Diet and physical activities Evidence for depression or mood disorders  The patient's weight, height, BMI, and visual acuity have been recorded in the chart.  I  have made referrals, counseling, and provided education to the patient based on review of the above and I have provided the patient with a written personalized care plan for preventive services.     Vicie Mutters, PA-C   07/18/2018

## 2018-07-18 ENCOUNTER — Encounter: Payer: Self-pay | Admitting: Physician Assistant

## 2018-07-18 ENCOUNTER — Ambulatory Visit: Payer: Medicare Other | Admitting: Physician Assistant

## 2018-07-18 ENCOUNTER — Other Ambulatory Visit: Payer: Self-pay

## 2018-07-18 VITALS — BP 122/70 | HR 86 | Temp 97.6°F | Wt 225.0 lb

## 2018-07-18 DIAGNOSIS — R6889 Other general symptoms and signs: Secondary | ICD-10-CM

## 2018-07-18 DIAGNOSIS — E1169 Type 2 diabetes mellitus with other specified complication: Secondary | ICD-10-CM

## 2018-07-18 DIAGNOSIS — Z0001 Encounter for general adult medical examination with abnormal findings: Secondary | ICD-10-CM

## 2018-07-18 DIAGNOSIS — D696 Thrombocytopenia, unspecified: Secondary | ICD-10-CM

## 2018-07-18 DIAGNOSIS — K219 Gastro-esophageal reflux disease without esophagitis: Secondary | ICD-10-CM

## 2018-07-18 DIAGNOSIS — F3341 Major depressive disorder, recurrent, in partial remission: Secondary | ICD-10-CM

## 2018-07-18 DIAGNOSIS — K8020 Calculus of gallbladder without cholecystitis without obstruction: Secondary | ICD-10-CM

## 2018-07-18 DIAGNOSIS — Z0000 Encounter for general adult medical examination without abnormal findings: Secondary | ICD-10-CM

## 2018-07-18 DIAGNOSIS — I10 Essential (primary) hypertension: Secondary | ICD-10-CM

## 2018-07-18 DIAGNOSIS — Z79899 Other long term (current) drug therapy: Secondary | ICD-10-CM

## 2018-07-18 DIAGNOSIS — E669 Obesity, unspecified: Secondary | ICD-10-CM

## 2018-07-18 DIAGNOSIS — N182 Chronic kidney disease, stage 2 (mild): Secondary | ICD-10-CM

## 2018-07-18 DIAGNOSIS — E349 Endocrine disorder, unspecified: Secondary | ICD-10-CM

## 2018-07-18 DIAGNOSIS — E1122 Type 2 diabetes mellitus with diabetic chronic kidney disease: Secondary | ICD-10-CM | POA: Diagnosis not present

## 2018-07-18 DIAGNOSIS — E785 Hyperlipidemia, unspecified: Secondary | ICD-10-CM

## 2018-07-18 DIAGNOSIS — E559 Vitamin D deficiency, unspecified: Secondary | ICD-10-CM

## 2018-07-18 DIAGNOSIS — Z86718 Personal history of other venous thrombosis and embolism: Secondary | ICD-10-CM

## 2018-07-18 DIAGNOSIS — I723 Aneurysm of iliac artery: Secondary | ICD-10-CM

## 2018-07-18 MED ORDER — FAMOTIDINE 40 MG PO TABS: 40.0000 mg | ORAL_TABLET | Freq: Every evening | ORAL | 1 refills | Status: DC

## 2018-07-18 NOTE — Patient Instructions (Signed)
What does your A1C results mean?  Your A1C is a measure of your sugar over the past 3 months   Use this chart as a guide to compare the results of your A1C blood test to your estimated average daily blood sugar:  A1C Range Average Sugar  4.0-6.0% 60-120 mg/dl  6.1-7.0% 121 - 150 mg/dl  7.1-8.0% 151-180 mg/dl  8.1-9.0% 181-210 mg/dl  10.1-11% 211-240 mg/dl  11.1-12.0% 271-300 mg/dl  12.1-13.0% 301-330 mg/dl  13.1-14.0% 331-360 mg/dl  Greater than 14.0% Greater than 360 mg/dl       When it comes to diets, agreement about the perfect plan isn't easy to find, even among the experts. Experts at the [employer] developed an idea known as the Healthy Eating Plate. Just imagine a plate divided into logical, healthy portions.  The emphasis is on diet quality:  Load up on vegetables and fruits - one-half of your plate: Aim for color and variety, and remember that potatoes don't count.  Go for whole grains - one-quarter of your plate: Whole wheat, barley, wheat berries, quinoa, oats, brown rice, and foods made with them. If you want pasta, go with whole wheat pasta.  Protein power - one-quarter of your plate: Fish, chicken, beans, and nuts are all healthy, versatile protein sources. Limit red meat.  The diet, however, does go beyond the plate, offering a few other suggestions.  Use healthy plant oils, such as olive, canola, soy, corn, sunflower and peanut. Check the labels, and avoid partially hydrogenated oil, which have unhealthy trans fats.  If you're thirsty, drink water. Coffee and tea are good in moderation, but skip sugary drinks and limit milk and dairy products to one or two daily servings.  The type of carbohydrate in the diet is more important than the amount. Some sources of carbohydrates, such as vegetables, fruits, whole grains, and beans-are healthier than others.  Finally, stay active.

## 2018-07-19 LAB — CBC WITH DIFFERENTIAL/PLATELET
Absolute Monocytes: 274 cells/uL (ref 200–950)
Basophils Absolute: 68 cells/uL (ref 0–200)
Basophils Relative: 1.2 %
Eosinophils Absolute: 154 cells/uL (ref 15–500)
Eosinophils Relative: 2.7 %
HCT: 37.1 % — ABNORMAL LOW (ref 38.5–50.0)
Hemoglobin: 12.6 g/dL — ABNORMAL LOW (ref 13.2–17.1)
Lymphs Abs: 1300 cells/uL (ref 850–3900)
MCH: 31.8 pg (ref 27.0–33.0)
MCHC: 34 g/dL (ref 32.0–36.0)
MCV: 93.7 fL (ref 80.0–100.0)
MPV: 9.9 fL (ref 7.5–12.5)
Monocytes Relative: 4.8 %
Neutro Abs: 3905 cells/uL (ref 1500–7800)
Neutrophils Relative %: 68.5 %
Platelets: 140 10*3/uL (ref 140–400)
RBC: 3.96 10*6/uL — ABNORMAL LOW (ref 4.20–5.80)
RDW: 12.7 % (ref 11.0–15.0)
Total Lymphocyte: 22.8 %
WBC: 5.7 10*3/uL (ref 3.8–10.8)

## 2018-07-19 LAB — COMPLETE METABOLIC PANEL WITH GFR
AG Ratio: 1.9 (calc) (ref 1.0–2.5)
ALT: 25 U/L (ref 9–46)
AST: 17 U/L (ref 10–35)
Albumin: 4.4 g/dL (ref 3.6–5.1)
Alkaline phosphatase (APISO): 43 U/L (ref 35–144)
BUN/Creatinine Ratio: 17 (calc) (ref 6–22)
BUN: 30 mg/dL — ABNORMAL HIGH (ref 7–25)
CO2: 21 mmol/L (ref 20–32)
Calcium: 9.6 mg/dL (ref 8.6–10.3)
Chloride: 104 mmol/L (ref 98–110)
Creat: 1.8 mg/dL — ABNORMAL HIGH (ref 0.70–1.18)
GFR, Est African American: 41 mL/min/{1.73_m2} — ABNORMAL LOW (ref >=60)
GFR, Est Non African American: 36 mL/min/{1.73_m2} — ABNORMAL LOW (ref >=60)
Globulin: 2.3 g/dL (calc) (ref 1.9–3.7)
Glucose, Bld: 354 mg/dL — ABNORMAL HIGH (ref 65–99)
Potassium: 4.2 mmol/L (ref 3.5–5.3)
Sodium: 136 mmol/L (ref 135–146)
Total Bilirubin: 0.7 mg/dL (ref 0.2–1.2)
Total Protein: 6.7 g/dL (ref 6.1–8.1)

## 2018-07-19 LAB — HEMOGLOBIN A1C
Hgb A1c MFr Bld: 11.4 % of total Hgb — ABNORMAL HIGH (ref <5.7)
Mean Plasma Glucose: 280 (calc)
eAG (mmol/L): 15.5 (calc)

## 2018-07-19 LAB — MICROALBUMIN / CREATININE URINE RATIO
Creatinine, Urine: 89 mg/dL (ref 20–320)
Microalb Creat Ratio: 30 mcg/mg creat — ABNORMAL HIGH (ref <30)
Microalb, Ur: 2.7 mg/dL

## 2018-07-19 LAB — TSH: TSH: 1.57 mIU/L (ref 0.40–4.50)

## 2018-07-19 LAB — LIPID PANEL
Cholesterol: 144 mg/dL (ref <200)
HDL: 24 mg/dL — ABNORMAL LOW (ref >=40)
Non-HDL Cholesterol (Calc): 120 mg/dL (calc) (ref <130)
Total CHOL/HDL Ratio: 6 (calc) — ABNORMAL HIGH (ref <5.0)
Triglycerides: 577 mg/dL — ABNORMAL HIGH (ref <150)

## 2018-07-19 LAB — MAGNESIUM: Magnesium: 1.8 mg/dL (ref 1.5–2.5)

## 2018-07-19 MED ORDER — GLIPIZIDE 5 MG PO TABS: ORAL_TABLET | ORAL | 0 refills | Status: DC

## 2018-07-19 NOTE — Addendum Note (Signed)
Addended by: Vladimir Crofts on: 07/19/2018 08:34 AM   Modules accepted: Orders

## 2018-08-07 LAB — HM DIABETES EYE EXAM

## 2018-08-09 ENCOUNTER — Encounter: Payer: Self-pay | Admitting: *Deleted

## 2018-08-10 ENCOUNTER — Other Ambulatory Visit: Payer: Self-pay | Admitting: Physician Assistant

## 2018-08-10 DIAGNOSIS — K219 Gastro-esophageal reflux disease without esophagitis: Secondary | ICD-10-CM

## 2018-08-22 NOTE — Progress Notes (Signed)
Diabetes Education and Follow-Up Visit  78 y.o.male presents for diabetic education. He has Diabetes Mellitus type 2  He is on metformin 4 a day He has CKD, on an ARB (losartan) He has hyperlipidemia, on crestor unknown if at goal due to Trigs He has glipizide at home, 5 mg a day with largest meal Last hemoglobin A1c was: Lab Results  Component Value Date   HGBA1C 11.4 (H) 07/18/2018   HGBA1C 7.6 (H) 01/11/2018   HGBA1C 6.1 (H) 10/10/2017   BMI is There is no height or weight on file to calculate BMI., he is working on diet and exercise. Wt Readings from Last 3 Encounters:  07/18/18 225 lb (102.1 kg)  01/11/18 231 lb (104.8 kg)  10/11/17 224 lb 12.8 oz (102 kg)   HE COMPLAINS OF TOENAIL FUNGUS, WE HAVE TREATED INT HE OFFICE AND NOT BETTER, SINCE DM WILL SEND TO PODIATRY  Pt checks his sugars 3 x day  In AM 233 average, up to 350 after food.  Taking glipizide with largest meal   Glucometer: One touch ultra    Lab Results  Component Value Date   GFRNONAA 36 (L) 07/18/2018    Lab Results  Component Value Date   CREATININE 1.80 (H) 07/18/2018   BUN 30 (H) 07/18/2018   NA 136 07/18/2018   K 4.2 07/18/2018   CL 104 07/18/2018   CO2 21 07/18/2018    Lab Results  Component Value Date   MICROALBUR 2.7 07/18/2018    Lab Results  Component Value Date   CHOL 144 07/18/2018   HDL 24 (L) 07/18/2018   [place]  07/18/2018     Comment:     . LDL cholesterol not calculated. Triglyceride levels greater than 400 mg/dL invalidate calculated LDL results. . Reference range: <100 . Desirable range <100 mg/dL for primary prevention;   <70 mg/dL for patients with CHD or diabetic patients  with > or = 2 CHD risk factors. Marland Kitchen LDL-C is now calculated using the Martin-Hopkins  calculation, which is a validated novel method providing  better accuracy than the Friedewald equation in the  estimation of LDL-C.  Cresenciano Genre et al. Annamaria Helling. 1610;960(45): 2061-2068   (http://education.[employer].com/faq/FAQ164)    TRIG 577 (H) 07/18/2018   CHOLHDL 6.0 (H) 07/18/2018     Problem List has History of DVT of lower extremity; CKD stage 2 due to type 2 diabetes mellitus ([place]); Hypertension; Hyperlipidemia associated with type 2 diabetes mellitus ([place]); Vitamin D deficiency; Testosterone deficiency; Obesity (BMI 30.0-34.9); Medication management; GERD ; Thrombocytopenia ([place]); Aneurysm of left iliac artery (HCC); Gallstones; Depression; and Type 2 diabetes mellitus ([place]) on their problem list.  Medications Current Outpatient Medications on File Prior to Visit  Medication Sig  . aspirin 81 MG tablet Take 81 mg by mouth daily.  . Cholecalciferol (VITAMIN D3) 5000 units CAPS Take 2 capsules (10,000 Units total) by mouth daily.  . Cinnamon 500 MG capsule Take 100 mg by mouth 2 (two) times daily.  . citalopram (CELEXA) 20 MG tablet TAKE 1 TABLET BY MOUTH EVERY DAY  . famotidine (PEPCID) 40 MG tablet TAKE 1 TABLET BY MOUTH EVERY DAY IN THE EVENING  . fenofibrate (TRICOR) 145 MG tablet Take 1 tablet daily for Blood Fats  . fexofenadine (ALLEGRA) 180 MG tablet Take 1 tablet (180 mg total) by mouth daily.  . fluticasone (FLONASE) 50 MCG/ACT nasal spray USE 2 SPRAYS IN EACH NOSTRIL DAILY AS NEEDED  . glipiZIDE (GLUCOTROL) 5 MG tablet One pill daily WITH  largest meal, stop if any low sugars, only take with food  . glucose blood test strip USE TO CHECK BLOOD SUGAR 1 TIME DAILY-DX-E11.22  . losartan (COZAAR) 100 MG tablet TAKE 1/2 TO 1 TABLET BY MOUTH DAILY FOR BP.  . metFORMIN (GLUCOPHAGE-XR) 500 MG 24 hr tablet TAKE 2 TABLET BY MOUTH 2 TIMES DAILY WITH THE LARGEST MEALS.  . Multiple Vitamins-Minerals (MEGA MULTIVITAMIN FOR MEN) TABS Take 1 tablet by mouth daily.  . predniSONE (DELTASONE) 20 MG tablet 2 tablets daily for 3 days, 1 tablet daily for 4 days. (Patient not taking: Reported on 07/18/2018)  . PROAIR HFA 108 (90 Base) MCG/ACT inhaler INHALE 2 PUFFS INTO  THE LUNGS EVERY 4 (FOUR) HOURS AS NEEDED FOR WHEEZING OR SHORTNESS OF BREATH.  . rosuvastatin (CRESTOR) 5 MG tablet TAKE 1 TABLET BY MOUTH EVERY DAY  . tamsulosin (FLOMAX) 0.4 MG CAPS capsule TAKE ONE CAPSULE BY MOUTH EVERY DAY  . terbinafine (LAMISIL) 250 MG tablet Take 250 mg by mouth daily.   No current facility-administered medications on file prior to visit.     ROS- see HPI  Physical Exam: There were no vitals taken for this visit. There is no height or weight on file to calculate BMI. General Appearance:Well sounding, in no apparent distress.  ENT/Mouth: No hoarseness, No cough for duration of visit.  Respiratory: completing full sentences without distress, without audible wheeze Neuro: Awake and oriented X 3,  Psych:  Insight and Judgment appropriate.     Plan and Assessment: Diabetes Education: Reviewed 'ABCs' of diabetes management (respective goals in parentheses):  A1C (<7), blood pressure (<130/80), and cholesterol (LDL <70) Eye Exam yearly and Dental Exam every 6 months. Dietary recommendations Physical Activity recommendations - Strongly advised him to start checking sugars at different times of the day - check 2 times a day, rotating checks - given sugar log and advised how to fill it and to bring it at next appt  - given foot care handout and explained the principles  - given instructions for hypoglycemia management  WILL CUT BACK ON METFORMIN DUE TO CKD INCREASE GLIPIZIDE TO 5MG  TWICE A DAY WILL WORK VERY HARD ON DIET  Patient does not want additional medication, states he will get his sugars down with diet which he has done in the past  HE COMPLAINS OF TOENAIL FUNGUS, WE HAVE TREATED INT HE OFFICE AND NOT BETTER, SINCE DM WILL SEND TO PODIATRY  CALL IN 1 MONTH WITH NUMBERS  Future Appointments  Date Time Provider [place]  08/24/2018 10:00 AM Vicie Mutters, PA-C [facility] None  11/01/2018  3:45 PM Unk Pinto, MD [facility] None  07/29/2019  11:15 AM Vicie Mutters, PA-C [facility] None

## 2018-08-24 ENCOUNTER — Encounter: Payer: Self-pay | Admitting: Physician Assistant

## 2018-08-24 ENCOUNTER — Ambulatory Visit: Payer: Medicare Other | Admitting: Physician Assistant

## 2018-08-24 ENCOUNTER — Other Ambulatory Visit: Payer: Self-pay

## 2018-08-24 DIAGNOSIS — B351 Tinea unguium: Secondary | ICD-10-CM

## 2018-08-24 DIAGNOSIS — N183 Chronic kidney disease, stage 3 unspecified: Secondary | ICD-10-CM

## 2018-08-24 DIAGNOSIS — E1122 Type 2 diabetes mellitus with diabetic chronic kidney disease: Secondary | ICD-10-CM

## 2018-08-24 NOTE — Patient Instructions (Addendum)
BACK OFF TO METFORMIN TO 2 A DAY INCREASE GLIPIZIDE TO 2 A DAY WITH FOOD  Please be careful with glipizide (generic).   This medication forces your blood sugar down no matter what it is starting at.  ONLY TAKE THIS MEDICATION WITH FOOD  WILL SEND TO PODIATRIST    If at any time you start to have low blood sugars in the morning or during the day please stop this medication.  Please never take this medication if you are sick or can not eat.  A low blood sugar is much more dangerous than a high blood sugar. Your brain needs two things, sugar and oxygen.      Bad carbs also include fruit juice, alcohol, and sweet tea. These are empty calories that do not signal to your brain that you are full.   Please remember the good carbs are still carbs which convert into sugar. So please measure them out no more than 1/2-1 cup of rice, oatmeal, pasta, and beans  Veggies are however free foods! Pile them on.   Not all fruit is created equal. Please see the list below, the fruit at the bottom is higher in sugars than the fruit at the top. Please avoid all dried fruits.

## 2018-08-25 ENCOUNTER — Other Ambulatory Visit: Payer: Self-pay | Admitting: Physician Assistant

## 2018-09-07 ENCOUNTER — Other Ambulatory Visit: Payer: Self-pay | Admitting: Adult Health Nurse Practitioner

## 2018-09-07 DIAGNOSIS — K219 Gastro-esophageal reflux disease without esophagitis: Secondary | ICD-10-CM

## 2018-09-09 ENCOUNTER — Other Ambulatory Visit: Payer: Self-pay | Admitting: Internal Medicine

## 2018-09-10 ENCOUNTER — Other Ambulatory Visit: Payer: Self-pay | Admitting: Adult Health

## 2018-09-10 DIAGNOSIS — J309 Allergic rhinitis, unspecified: Secondary | ICD-10-CM

## 2018-09-13 ENCOUNTER — Encounter: Payer: Self-pay | Admitting: Podiatry

## 2018-09-13 ENCOUNTER — Other Ambulatory Visit: Payer: Self-pay

## 2018-09-13 ENCOUNTER — Ambulatory Visit (INDEPENDENT_AMBULATORY_CARE_PROVIDER_SITE_OTHER): Payer: Medicare Other | Admitting: Podiatry

## 2018-09-13 VITALS — BP 138/80 | HR 82 | Temp 97.6°F | Resp 16

## 2018-09-13 DIAGNOSIS — L603 Nail dystrophy: Secondary | ICD-10-CM | POA: Diagnosis not present

## 2018-09-13 NOTE — Progress Notes (Signed)
Subjective:  Patient ID: Joshua Peterson, male    DOB: Nov 02, 1940,  MRN: [id number] HPI Chief Complaint  Patient presents with  . Nail Problem    Toenails left - thick and discolored nails x years, hallux nail left - bilateral borders is ingrown, tender, PCP rx'd terbinafine few months ago x 30 days-completed and says he did have some clearing  . Diabetes    Last a1c was 11.1  . New Patient (Initial Visit)    78 y.o. male presents with the above complaint.   ROS: He denies fever chills nausea vomiting muscle aches pains calf pain back pain chest pain shortness of breath.  Past Medical History:  Diagnosis Date  . Allergic rhinitis   . Allergy   . Bladder stone   . CKD (chronic kidney disease), stage II   . Clotting disorder ([place])   . External carotid artery stenosis    PT ASYMPTOMATIC per last duplex 05-18-2011  right moderate stenosis and left moderate to severe  . Gross hematuria   . History of adenomatous polyp of colon    2006;  2009 and 2014 tubular adenoma's  . History of DVT in adulthood    08/ 2009 right lower extremity;  2013  left lower extremity  . History of pulmonary embolism    bilatera lung  08/ 2009  . Hypertension   . Hypogonadism male   . Internal carotid artery stenosis, bilateral    PT ASYMPTOMATIC per last duplex 05-18-2011  bilateral 40-59% stenosis  . Lower urinary tract symptoms (LUTS)   . Mixed hyperlipidemia   . Right nephrolithiasis 06/20/2016  . Type 2 diabetes mellitus ([place])   . Vitamin D deficiency   . Wears glasses    Past Surgical History:  Procedure Laterality Date  . COLONOSCOPY  last one 07-12-2012  . CYSTOSCOPY WITH LITHOLAPAXY N/A 07/18/2016   Procedure: CYSTOSCOPY WITH LITHOLAPAXY;  Surgeon: Kathie Rhodes, MD;  Location: [facility];  Service: Urology;  Laterality: N/A;  . IVC FILTER INSERTION  11/21/2007  . IVC FILTER REMOVAL  01/31/2008  . 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 RIGHT CHEEK/MOUTH BIOPSY   09/16/2008  (office)   squamous hyperplasia w/ hyperkeratosis  . RIGHT DEEP CERVICAL NODE BIOPSY  01/03/2001   right posterior neck (per path report-- lymphoid hyperplasia w/ florid follicular hyperplasia, no malignancy(  . TONSILLECTOMY  age 34    Current Outpatient Medications:  .  albuterol (VENTOLIN HFA) 108 (90 Base) MCG/ACT inhaler, , Disp: , Rfl:  .  aspirin 81 MG tablet, Take 81 mg by mouth daily., Disp: , Rfl:  .  Cholecalciferol (VITAMIN D3) 5000 units CAPS, Take 2 capsules (10,000 Units total) by mouth daily., Disp: , Rfl:  .  Cinnamon 500 MG capsule, Take 100 mg by mouth 2 (two) times daily., Disp: , Rfl:  .  citalopram (CELEXA) 20 MG tablet, , Disp: , Rfl:  .  famotidine (PEPCID) 40 MG tablet, , Disp: , Rfl:  .  fenofibrate (TRICOR) 145 MG tablet, , Disp: , Rfl:  .  fexofenadine (ALLEGRA) 180 MG tablet, Take 1 tablet Daily for Allergies, Disp: 90 tablet, Rfl: 3 .  fluticasone (FLONASE) 50 MCG/ACT nasal spray, , Disp: , Rfl:  .  glipiZIDE (GLUCOTROL) 5 MG tablet, , Disp: , Rfl:  .  glucose blood test strip, , Disp: , Rfl:  .  losartan (COZAAR) 100 MG tablet, , Disp: , Rfl:  .  metFORMIN (GLUCOPHAGE-XR) 500 MG 24 hr  tablet, TAKE 2 TABLET BY MOUTH 2 TIMES DAILY WITH THE LARGEST MEALS., Disp: 360 tablet, Rfl: 1 .  Multiple Vitamins-Minerals (MEGA MULTIVITAMIN FOR MEN) TABS, Take 1 tablet by mouth daily., Disp: , Rfl:  .  PROAIR HFA 108 (90 Base) MCG/ACT inhaler, , Disp: , Rfl:  .  rosuvastatin (CRESTOR) 5 MG tablet, , Disp: , Rfl:  .  tamsulosin (FLOMAX) 0.4 MG CAPS capsule, , Disp: , Rfl:   Allergies  Allergen Reactions  . Doxycycline   . Niacin And Related     flushing  . Sulfa Antibiotics Itching   Review of Systems Objective:   Vitals:   09/13/18 1006  BP: 138/80  Pulse: 82  Resp: 16  Temp: 97.6 F (36.4 C)    General: Well developed, nourished, in no acute distress, alert and oriented x3   Dermatological: Skin is warm, dry and supple bilateral. Nails x 10  are well maintained; remaining integument appears unremarkable at this time. There are no open sores, no preulcerative lesions, no rash or signs of infection present.  Sharply incurvated nail margin thick yellow dystrophic with mycotic nails 1 through 5 of the left foot.  Mild paronychia around the fibular border of the hallux left with onychocryptosis.  However his hemoglobin A1c is 11.1 so no surgical procedures will be performed.  Vascular: Dorsalis Pedis artery and Posterior Tibial artery pedal pulses are 2/4 bilateral with immedate capillary fill time. Pedal hair growth present. No varicosities and no lower extremity edema present bilateral.   Neruologic: Grossly intact via light touch bilateral. Vibratory intact via tuning fork bilateral. Protective threshold with Semmes Wienstein monofilament intact to all pedal sites bilateral. Patellar and Achilles deep tendon reflexes 2+ bilateral. No Babinski or clonus noted bilateral.   Musculoskeletal: No gross boney pedal deformities bilateral. No pain, crepitus, or limitation noted with foot and ankle range of motion bilateral. Muscular strength 5/5 in all groups tested bilateral.  Gait: Unassisted, Nonantalgic.    Radiographs:  None taken  Assessment & Plan:   Assessment: Mild paronychia, probable onychomycosis.  Plan: Samples of the skin and nail were taken today to be sent for pathologic evaluation also removed a small ingrown from the fibular border of the hallux left.  Encouraged him to soak Epson salts and warm water.  He will notify us with any questions or concerns.     Yzabella Crunk T. [place], [place]

## 2018-09-14 ENCOUNTER — Other Ambulatory Visit: Payer: Self-pay | Admitting: Urology

## 2018-09-24 NOTE — Progress Notes (Signed)
Diabetes Education and Follow-Up Visit   Plan and Assessment:  CKD stage 2 due to type 2 diabetes mellitus (HCC) -     Fructosamine -     COMPLETE METABOLIC PANEL WITH GFR Recheck kidney funuction Keep log of sugars and follow up here in the office Continue glipizide twice a day Continue metformin just twice a day and stop depending on GFR Continue to work on diet. .   Hyperlipidemia associated with type 2 diabetes mellitus ([place]) Monitor  Type 2 diabetes mellitus with stage 2 chronic kidney disease, without long-term current use of insulin (HCC) -     Fructosamine -     COMPLETE METABOLIC PANEL WITH GFR Keep log of sugars and follow up here in the office Continue glipizide twice a day Continue metformin just twice a day and stop depending on GFR Continue to work on diet. Marland Kitchen   HPI  78 y.o.male presents for diabetic education. He has Diabetes Mellitus type 2  He is on metformin 2 a day METFORMIN CUT BACK LAST VISIT DUE TO CKD He has CKD, on an ARB (losartan)   GLIPIZIDE INCREASED TO 2 A DAY  He has hyperlipidemia, on crestor unknown if at goal due to Trigs Last hemoglobin A1c was: Lab Results  Component Value Date   HGBA1C 11.4 (H) 07/18/2018   HGBA1C 7.6 (H) 01/11/2018   HGBA1C 6.1 (H) 10/10/2017   BMI is Body mass index is 32.2 kg/m., he is working on diet and exercise. Started nutrisystem today and wants to stay on this Wt Readings from Last 3 Encounters:  09/26/18 224 lb 6.4 oz (101.8 kg)  07/18/18 225 lb (102.1 kg)  01/11/18 231 lb (104.8 kg)   Pt checks his sugars 3 x day  In AM 233 average, up to 350 after food.   Glucometer: One touch ultra  He is walking and doing exercises with bonnie.   Lab Results  Component Value Date   GFRNONAA 36 (L) 07/18/2018    Lab Results  Component Value Date   CREATININE 1.80 (H) 07/18/2018   BUN 30 (H) 07/18/2018   NA 136 07/18/2018   K 4.2 07/18/2018   CL 104 07/18/2018   CO2 21 07/18/2018   Lab Results   Component Value Date   MICROALBUR 2.7 07/18/2018    Lab Results  Component Value Date   CHOL 144 07/18/2018   HDL 24 (L) 07/18/2018   [place]  07/18/2018     Comment:     . LDL cholesterol not calculated. Triglyceride levels greater than 400 mg/dL invalidate calculated LDL results. . Reference range: <100 . Desirable range <100 mg/dL for primary prevention;   <70 mg/dL for patients with CHD or diabetic patients  with > or = 2 CHD risk factors. Marland Kitchen LDL-C is now calculated using the Martin-Hopkins  calculation, which is a validated novel method providing  better accuracy than the Friedewald equation in the  estimation of LDL-C.  Cresenciano Genre et al. Annamaria Helling. 7829;562(13): 2061-2068  (http://education.[employer].com/faq/FAQ164)    TRIG 577 (H) 07/18/2018   CHOLHDL 6.0 (H) 07/18/2018     Problem List has History of DVT of lower extremity; CKD stage 2 due to type 2 diabetes mellitus ([place]); Hypertension; Hyperlipidemia associated with type 2 diabetes mellitus ([place]); Vitamin D deficiency; Testosterone deficiency; Obesity (BMI 30.0-34.9); Medication management; GERD ; Thrombocytopenia ([place]); Aneurysm of left iliac artery (HCC); Gallstones; Depression; and Type 2 diabetes mellitus ([place]) on their problem list.  Medications Current Outpatient Medications on File Prior  to Visit  Medication Sig  . albuterol (VENTOLIN HFA) 108 (90 Base) MCG/ACT inhaler   . aspirin 81 MG tablet Take 81 mg by mouth daily.  . Cholecalciferol (VITAMIN D3) 5000 units CAPS Take 2 capsules (10,000 Units total) by mouth daily.  . Cinnamon 500 MG capsule Take 100 mg by mouth 2 (two) times daily.  . citalopram (CELEXA) 20 MG tablet   . famotidine (PEPCID) 40 MG tablet   . fenofibrate (TRICOR) 145 MG tablet   . fexofenadine (ALLEGRA) 180 MG tablet Take 1 tablet Daily for Allergies  . fluticasone (FLONASE) 50 MCG/ACT nasal spray   . glipiZIDE (GLUCOTROL) 5 MG tablet   . glucose blood test strip   . losartan  (COZAAR) 100 MG tablet   . metFORMIN (GLUCOPHAGE-XR) 500 MG 24 hr tablet TAKE 2 TABLET BY MOUTH 2 TIMES DAILY WITH THE LARGEST MEALS.  . Multiple Vitamins-Minerals (MEGA MULTIVITAMIN FOR MEN) TABS Take 1 tablet by mouth daily.  Marland Kitchen PROAIR HFA 108 (90 Base) MCG/ACT inhaler   . rosuvastatin (CRESTOR) 5 MG tablet   . tamsulosin (FLOMAX) 0.4 MG CAPS capsule    No current facility-administered medications on file prior to visit.     ROS- see HPI  Physical Exam: Blood pressure 120/80, pulse 66, temperature (!) 97.5 F (36.4 C), height 5\' 10"  (1.778 m), weight 224 lb 6.4 oz (101.8 kg), SpO2 99 %. Body mass index is 32.2 kg/m. General Appearance: Well nourished, in no apparent distress. Eyes: PERRLA, EOMs, conjunctiva no swelling or erythema Sinuses: No Frontal/maxillary tenderness ENT/Mouth: Ext aud canals clear, TMs without erythema, bulging. No erythema, swelling, or exudate on post pharynx.  Tonsils not swollen or erythematous. Hearing normal.  Neck: Supple, thyroid normal.  Respiratory: Respiratory effort normal, BS equal bilaterally without rales, rhonchi, wheezing or stridor.  Cardio: RRR with no MRGs. Brisk peripheral pulses without edema.  Abdomen: Soft, + BS.  Non tender, no guarding, rebound, hernias, masses. Lymphatics: Non tender without lymphadenopathy.  Musculoskeletal: Full ROM, 5/5 strength, Normal gait Skin: Warm, dry without rashes, lesions, ecchymosis.  Neuro: Cranial nerves intact. No cerebellar symptoms.  Psych: Awake and oriented X 3, normal affect, Insight and Judgment appropriate.    Future Appointments  Date Time Provider [place]  10/16/2018  9:45 AM [place], [place] T, [place] [facility]  11/01/2018  3:45 PM Unk Pinto, MD [facility] None  07/29/2019 11:15 AM Vicie Mutters, PA-C [facility] None

## 2018-09-26 ENCOUNTER — Other Ambulatory Visit: Payer: Self-pay

## 2018-09-26 ENCOUNTER — Ambulatory Visit: Payer: Medicare Other | Admitting: Physician Assistant

## 2018-09-26 ENCOUNTER — Encounter: Payer: Self-pay | Admitting: Physician Assistant

## 2018-09-26 VITALS — BP 120/80 | HR 66 | Temp 97.5°F | Ht 70.0 in | Wt 224.4 lb

## 2018-09-26 DIAGNOSIS — N182 Chronic kidney disease, stage 2 (mild): Secondary | ICD-10-CM

## 2018-09-26 DIAGNOSIS — E785 Hyperlipidemia, unspecified: Secondary | ICD-10-CM

## 2018-09-26 DIAGNOSIS — E1122 Type 2 diabetes mellitus with diabetic chronic kidney disease: Secondary | ICD-10-CM | POA: Diagnosis not present

## 2018-09-26 DIAGNOSIS — E1169 Type 2 diabetes mellitus with other specified complication: Secondary | ICD-10-CM

## 2018-09-26 MED ORDER — GLIPIZIDE 5 MG PO TABS: ORAL_TABLET | ORAL | 0 refills | Status: DC

## 2018-09-26 NOTE — Patient Instructions (Addendum)
    When it comes to diets, agreement about the perfect plan isn't easy to find, even among the experts. Experts at the [place] developed an idea known as the Healthy Eating Plate. Just imagine a plate divided into logical, healthy portions.  The emphasis is on diet quality:  Load up on vegetables and fruits - one-half of your plate: Aim for color and variety, and remember that potatoes don't count.  Go for whole grains - one-quarter of your plate: Whole wheat, barley, wheat berries, quinoa, oats, brown rice, and foods made with them. If you want pasta, go with whole wheat pasta.  Protein power - one-quarter of your plate: Fish, chicken, beans, and nuts are all healthy, versatile protein sources. Limit red meat.  The diet, however, does go beyond the plate, offering a few other suggestions.  Use healthy plant oils, such as olive, canola, soy, corn, sunflower and peanut. Check the labels, and avoid partially hydrogenated oil, which have unhealthy trans fats.  If you're thirsty, drink water. Coffee and tea are good in moderation, but skip sugary drinks and limit milk and dairy products to one or two daily servings.  The type of carbohydrate in the diet is more important than the amount. Some sources of carbohydrates, such as vegetables, fruits, whole grains, and beans-are healthier than others.  Finally, stay active.    Drink 1/2 your body weight in fluid ounces of water daily; drink a tall glass of water 30 min before meals  Don't eat until you're stuffed- listen to your stomach and eat until you are 80% full   Try eating off of a salad plate; wait 10 min after finishing before going back for seconds  Start by eating the vegetables on your plate; aim for 50% of your meals to be fruits or vegetables  Then eat your protein - lean meats (grass fed if possible), fish, beans, nuts in moderation  Eat your carbs/starch last ONLY if you still are hungry. If you can,  stop before finishing it all  Avoid sugar and flour - the closer it looks to it's original form in nature, typically the better it is for you  Splurge in moderation - "assign" days when you get to splurge and have the "bad stuff" - I like to follow a 80% - 20% plan- "good" choices 80 % of the time, "bad" choices in moderation 20% of the time  Simple equation is: Calories out > calories in = weight loss - even if you eat the bad stuff, if you limit portions, you will still lose weight  What does your A1C results mean?  Your A1C is a measure of your sugar over the past 3 months   Use this chart as a guide to compare the results of your A1C blood test to your estimated average daily blood sugar:  A1C Range Average Sugar  4.0-6.0% 60-120 mg/dl  6.1-7.0% 121 - 150 mg/dl  7.1-8.0% 151-180 mg/dl  8.1-9.0% 181-210 mg/dl  10.1-11% 211-240 mg/dl  11.1-12.0% 271-300 mg/dl  12.1-13.0% 301-330 mg/dl  13.1-14.0% 331-360 mg/dl  Greater than 14.0% Greater than 360 mg/dl   If your morning sugar is always below 100 then the issue is with your sugar spiking after meals. Try to take your blood sugar approximately 2 hours after eating, this number should be less than 200. If it is not, think about the foods that you ate and better choices you can make.

## 2018-10-02 LAB — COMPLETE METABOLIC PANEL WITH GFR
AG Ratio: 1.9 (calc) (ref 1.0–2.5)
ALT: 21 U/L (ref 9–46)
AST: 16 U/L (ref 10–35)
Albumin: 4.2 g/dL (ref 3.6–5.1)
Alkaline phosphatase (APISO): 38 U/L (ref 35–144)
BUN/Creatinine Ratio: 15 (calc) (ref 6–22)
BUN: 23 mg/dL (ref 7–25)
CO2: 23 mmol/L (ref 20–32)
Calcium: 9.8 mg/dL (ref 8.6–10.3)
Chloride: 109 mmol/L (ref 98–110)
Creat: 1.54 mg/dL — ABNORMAL HIGH (ref 0.70–1.18)
GFR, Est African American: 49 mL/min/{1.73_m2} — ABNORMAL LOW (ref >=60)
GFR, Est Non African American: 43 mL/min/{1.73_m2} — ABNORMAL LOW (ref >=60)
Globulin: 2.2 g/dL (calc) (ref 1.9–3.7)
Glucose, Bld: 233 mg/dL — ABNORMAL HIGH (ref 65–99)
Potassium: 4.3 mmol/L (ref 3.5–5.3)
Sodium: 139 mmol/L (ref 135–146)
Total Bilirubin: 0.7 mg/dL (ref 0.2–1.2)
Total Protein: 6.4 g/dL (ref 6.1–8.1)

## 2018-10-02 LAB — FRUCTOSAMINE: Fructosamine: 378 umol/L — ABNORMAL HIGH (ref 205–285)

## 2018-10-10 ENCOUNTER — Other Ambulatory Visit: Payer: Self-pay | Admitting: Internal Medicine

## 2018-10-11 ENCOUNTER — Encounter (HOSPITAL_BASED_OUTPATIENT_CLINIC_OR_DEPARTMENT_OTHER): Payer: Self-pay | Admitting: *Deleted

## 2018-10-11 ENCOUNTER — Other Ambulatory Visit: Payer: Self-pay

## 2018-10-11 ENCOUNTER — Other Ambulatory Visit (HOSPITAL_COMMUNITY)
Admission: RE | Admit: 2018-10-11 | Discharge: 2018-10-11 | Disposition: A | Discharge status: Home / Self Care | Payer: Medicare Other | Source: Ambulatory Visit | Attending: Urology | Admitting: Urology

## 2018-10-11 DIAGNOSIS — Z1159 Encounter for screening for other viral diseases: Secondary | ICD-10-CM | POA: Insufficient documentation

## 2018-10-11 LAB — SARS CORONAVIRUS 2 (TAT 6-24 HRS): SARS Coronavirus 2: NEGATIVE

## 2018-10-11 NOTE — Progress Notes (Addendum)
Spoke w/ pt via phone for pre-op interview.  Npo after mn.  Arrive at 0730.  Needs istat and ekg.  Pt had covid test 10-11-2018.  Will take tricor, crestor, pepcid, and flomax am dos w/ sips of water.  Chart to be reviewed by anesthesia , Konrad Felix PA.  ADDENDUM:  Per anesthesia ok to proceed, Konrad Felix PA.

## 2018-10-12 NOTE — Progress Notes (Signed)
Anesthesia Chart Review   Case: [id number] Date/Time: 10/15/18 0916   Procedure: CYSTOSCOPY WITH LITHOLAPAXY (N/A )   Anesthesia type: General   Pre-op diagnosis: BLADDER STONES   Location: [facility] OR ROOM 2 / [place]   Surgeon: Kathie Rhodes, MD      DISCUSSION:78 y.o. former smoker (20 pack years, quit 04/16/91) with h/o HTN, PE, DVT 10/2007, HLD, DM II, carotid stenosis,  Common iliac aneurysm, CKD Stage III, bladder stones scheduled for above procedure 10/15/2018 with Dr. Kathie Rhodes.    Right common iliac aneurysm measuring 2.0 cm on [place] 09/04/17 followed by vascular surgeon, Dr. Harold Barban.  Last seen 09/04/17.  Stable at this visit with annual surveillance recommended.   Last seen by PCP 09/26/2018.   Anticipate pt can proceed with planned procedure barring acute status change.   VS: Ht 5\' 10"  (1.778 m)   Wt 98 kg   BMI 30.99 kg/m   PROVIDERS: Unk Pinto, MD is PCP    LABS: SDW (all labs ordered are listed, but only abnormal results are displayed)  Labs Reviewed - No data to display   IMAGES: VAS [place] AAA 09/04/2017   Final Interpretation: Abdominal Aorta: There is evidence of abnormal dilation of the Right Common Iliac artery measuring 2.0 x 1.8 cms. No distal aortic aneurysm visualized..   Carotid Doppler 05/18/2011   Final Interpretation: Abdominal Aorta: There is evidence of abnormal dilation of the Right Common Iliac artery measuring 2.0 x 1.8 cms. No distal aortic aneurysm visualized..     EKG:   CV:  Past Medical History:  Diagnosis Date  . Allergic rhinitis   . Bladder stones   . CKD (chronic kidney disease), stage III ([place])   . Common iliac aneurysm (HCC) right side   followed by vascular-- dr Trula Slade (note in epic)  . Environmental and seasonal allergies   . External carotid artery stenosis     per last duplex 05-18-2011  right moderate stenosis and left moderate to severe  . Hematuria   . History of adenomatous polyp of colon     tubular adenoma's  . History of bladder stone   . History of DVT in adulthood    08/ 2009 right lower extremity;  2013  left lower extremity  (10-11-2018 per pt no dvt since 2013)  . History of kidney stones   . History of pulmonary embolism    bilatera lung  08/ 2009  (10-11-2018  per pt no pe since 2009)  . History of wheezing    allergies--- prn inhaler  . Hypertension   . Hypogonadism male   . Internal carotid artery stenosis, bilateral     per last duplex 05-18-2011 in epic,   bilateral 40-59% stenosis  (10-11-2018  per pt denies stroke/tia S&S)  . Lower urinary tract symptoms (LUTS)   . Mixed hyperlipidemia   . Nephrolithiasis   . Type 2 diabetes mellitus ([place])   . Vitamin D deficiency   . Wears glasses     Past Surgical History:  Procedure Laterality Date  . COLONOSCOPY  last one 10-25-2017  . CYSTOSCOPY WITH LITHOLAPAXY N/A 07/18/2016   Procedure: CYSTOSCOPY WITH LITHOLAPAXY;  Surgeon: Kathie Rhodes, MD;  [facility];  Service: Urology;  Laterality: N/A;  . IVC FILTER INSERTION  11/21/2007  . IVC FILTER REMOVAL  01/31/2008  . 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 RIGHT CHEEK/MOUTH BIOPSY  09/16/2008  (office)   squamous hyperplasia w/ hyperkeratosis  .  RIGHT DEEP CERVICAL NODE BIOPSY  01/03/2001   right posterior neck (per path report-- lymphoid hyperplasia w/ florid follicular hyperplasia, no malignancy(  . TONSILLECTOMY  age 54    MEDICATIONS: No current facility-administered medications for this encounter.    Marland Kitchen aspirin 81 MG tablet  . Chromium-Cinnamon (CINNAMON PLUS CHROMIUM) [phone] MCG-MG CAPS  . Cyanocobalamin (B-12 PO)  . famotidine (PEPCID) 40 MG tablet  . fexofenadine (ALLEGRA) 180 MG tablet  . fluticasone (FLONASE) 50 MCG/ACT nasal spray  . glipiZIDE (GLUCOTROL) 5 MG tablet  . losartan (COZAAR) 100 MG tablet  . Multiple Vitamins-Minerals (MEGA MULTIVITAMIN FOR MEN) TABS  . rosuvastatin (CRESTOR) 5 MG tablet   . albuterol (VENTOLIN HFA) 108 (90 Base) MCG/ACT inhaler  . Cholecalciferol (VITAMIN D3) 5000 units CAPS  . fenofibrate (TRICOR) 145 MG tablet  . glucose blood test strip  . metFORMIN (GLUCOPHAGE-XR) 500 MG 24 hr tablet  . PROAIR HFA 108 (90 Base) MCG/ACT inhaler  . tamsulosin (FLOMAX) 0.4 MG CAPS capsule    Maia Plan [facility] Pre-Surgical Testing [phone] 10/12/18  9:27 AM

## 2018-10-12 NOTE — Anesthesia Preprocedure Evaluation (Addendum)
Anesthesia Evaluation  Patient identified by MRN, date of birth, ID band Patient awake    Reviewed: Allergy & Precautions, NPO status , Patient's Chart, lab work & pertinent test results  History of Anesthesia Complications Negative for: history of anesthetic complications  Airway Mallampati: II  TM Distance: >3 FB Neck ROM: Full    Dental no notable dental hx.    Pulmonary former smoker,  Hx of DVT/PE 2009, DVT in 2013   Pulmonary exam normal        Cardiovascular hypertension, Pt. on medications Normal cardiovascular exam     Neuro/Psych Depression negative neurological ROS     GI/Hepatic Neg liver ROS, GERD  Controlled and Medicated,  Endo/Other  diabetes, Type 2, Oral Hypoglycemic Agents  Renal/GU Renal InsufficiencyRenal diseaseRenal stones     Musculoskeletal negative musculoskeletal ROS (+)   Abdominal   Peds  Hematology negative hematology ROS (+)   Anesthesia Other Findings Day of surgery medications reviewed with the patient.  Reproductive/Obstetrics                          Anesthesia Physical Anesthesia Plan  ASA: II  Anesthesia Plan: General   Post-op Pain Management:    Induction: Intravenous  PONV Risk Score and Plan: 2 and Treatment may vary due to age or medical condition and Ondansetron  Airway Management Planned: LMA  Additional Equipment:   Intra-op Plan:   Post-operative Plan: Extubation in OR  Informed Consent: I have reviewed the patients History and Physical, chart, labs and discussed the procedure including the risks, benefits and alternatives for the proposed anesthesia with the patient or authorized representative who has indicated his/her understanding and acceptance.     Dental advisory given  Plan Discussed with: CRNA  Anesthesia Plan Comments: (See PAT note 10/12/2018, Konrad Felix, PA-C)      Anesthesia Quick Evaluation

## 2018-10-14 NOTE — H&P (Signed)
HPI: Joshua Peterson is a 78 year-old male with bladder stones.  He has had no symptoms.   He has not had prior urinary tract or prostate infections.   He does not have trouble emptying his bladder at this time. He does not have pain or burning when he urinates. He does not have an abnormal sensation when needing to urinate. He does not have to strain or bear down to start his urinary stream. He does not dribble at the end of urination.   He has had no prostate surgery.   Bladder calculi: He was found to have a 3 cm bladder stone and underwent cystolitholapaxy on 07/18/16.   06/12/18: He returns today for follow-up of a history of bladder and renal calculi. Has not had any flank pain or hematuria. He does however report that he is getting up 6-7 times at night and when he urinates his urinary stream will suddenly. And he will have some slight discomfort. He walked around and his stream will start back up. He reports that he noted significant improvement in his urinary stream after his cystolitholapaxy. He said he continues to void with a strong stream now.   09/11/18: He finds that he has some pain with urination at times with his stream cutting off. No hematuria. He denies any dysuria.     ALLERGIES: No Allergies    MEDICATIONS: Metformin Hcl 500 mg tablet  Tamsulosin Hcl 0.4 mg capsule  Adult Aspirin 81 mg tablet, delayed release  Cinnamon  Losartan Potassium 100 mg tablet  Multivitamin  Vitamin D3     GU PSH: Cysto Bladder Stone >2.5cm - 2018 Cysto Bladder Ureth Biopsy - 2018     NON-GU PSH: No Non-GU PSH    GU PMH: Bladder Stone, He has developed 2 new bladder calculi. They are quite large and also quite faint. This raises the question of whether these might be uric acid so he has fragments from his previous cystolitholapaxy and I have asked him to bring those by the office for stone analysis. I told him I might be able to dissolve his stones if they are uric acid. Since elective  surgeries are currently on hold due to the COVID-19 virus we have decided to re-evaluate him in 3 months with a KUB. - 06/12/2018, He has a 3 cm bladder stone that is symptomatic and requires therapy. We discussed the procedure cystolitholapaxy in detail including how the procedure will be performed, the risks and complications, the probability of success, the outpatient nature of the procedure as well as the anticipated postoperative course., - 2018 Renal cyst (Stable), Bilateral, Bilateral simple cysts are noted. - 06/12/2018 Benign Neo Lft adrenal gland, Left, This incidental finding appears to be asymptomatic and insignificant. - 2018 Gross hematuria, His gross hematuria is clearly secondary to his large bladder calculus noted on CT scan. - 2018 Renal atrophy, Right, This appeared chronic and is of minimal clinical significance. - 2018 Renal calculus, His right renal calculi are nonobstructing, not causing pain and not the source of his hematuria. Observation only is indicated. - 2018    NON-GU PMH: Diabetes Type 2 DVT, History GERD Hypercholesterolemia Hypertension    FAMILY HISTORY: Prostate Cancer - Father   SOCIAL HISTORY: Marital Status: Married Preferred Language: English; Ethnicity: Not Hispanic Or Latino; Race: White Current Smoking Status: Patient does not smoke anymore. Smoked for 10 years.   Tobacco Use Assessment Completed: Used Tobacco in last 30 days? Drinks 2 caffeinated drinks per day.  REVIEW OF SYSTEMS:    GU Review Male:   Patient denies frequent urination, hard to postpone urination, burning/ pain with urination, get up at night to urinate, leakage of urine, stream starts and stops, trouble starting your stream, have to strain to urinate , erection problems, and penile pain.  Gastrointestinal (Upper):   Patient denies nausea, vomiting, and indigestion/ heartburn.  Gastrointestinal (Lower):   Patient denies diarrhea and constipation.  Constitutional:   Patient  denies fever, night sweats, weight loss, and fatigue.  Skin:   Patient denies skin rash/ lesion and itching.  Eyes:   Patient denies blurred vision and double vision.  Ears/ Nose/ Throat:   Patient denies sore throat and sinus problems.  Hematologic/Lymphatic:   Patient denies swollen glands and easy bruising.  Cardiovascular:   Patient denies leg swelling and chest pains.  Respiratory:   Patient denies cough and shortness of breath.  Endocrine:   Patient denies excessive thirst.  Musculoskeletal:   Patient denies back pain and joint pain.  Neurological:   Patient denies headaches and dizziness.  Psychologic:   Patient denies depression and anxiety.   VITAL SIGNS:    Weight 225 lb / 102.06 kg  Height 70 in / 177.8 cm  BP 94/60 mmHg  Pulse 80 /min  BMI 32.3 kg/m   MULTI-SYSTEM PHYSICAL EXAMINATION:    Constitutional: Well-nourished. No physical deformities. Normally developed. Good grooming.  Neck: Neck symmetrical, not swollen. Normal tracheal position.  Respiratory: No labored breathing, no use of accessory muscles.   Cardiovascular: Normal temperature, normal extremity pulses, no swelling, no varicosities.  Lymphatic: No enlargement of neck, axillae, groin.  Skin: No paleness, no jaundice, no cyanosis. No lesion, no ulcer, no rash.  Neurologic / Psychiatric: Oriented to time, oriented to place, oriented to person. No depression, no anxiety, no agitation.  Gastrointestinal: No mass, no tenderness, no rigidity, non obese abdomen.  Eyes: Normal conjunctivae. Normal eyelids.  Ears, Nose, Mouth, and Throat: Left ear no scars, no lesions, no masses. Right ear no scars, no lesions, no masses. Nose no scars, no lesions, no masses. Normal hearing. Normal lips.  Musculoskeletal: Normal gait and station of head and neck.     PAST DATA REVIEWED:  Source Of History:  Patient  Lab Test Review:   BUN/Creatinine  Records Review:   Previous Patient Records, POC Tool  X-Ray Review: Renal  Ultrasound: Reviewed Films. Previous renal ultrasound images were reviewed and compared with today's study.    03/31/17  PSA  Total PSA 0.50 ng/dl   Notes:                     A creatinine in 4/20 was 1.80.   PROCEDURES:         Bladder Ultrasound - 53614  Bladder:  Normal bladder.  Prostate:  Normal prostate.      Independent review of his bladder ultrasound reveals bladder stone present with minimal PVR.   ASSESSMENT/PLAN:      ICD-10 Details  1 GU:   Bladder Stone - N21.0 Stable - I am going to perform cystolitholapaxy and then will send some of his stone for compositional analysis. I told him that this would likely help [place] prevent further stone formation in the future          Notes:   He has had cystolitholapaxy before but we discussed the procedure again today. I went over the risks and complications, the outpatient nature of the procedure and the probability of  success as well as the anticipated postop course. He understands and has elected to proceed.

## 2018-10-15 ENCOUNTER — Other Ambulatory Visit: Payer: Self-pay

## 2018-10-15 ENCOUNTER — Ambulatory Visit (HOSPITAL_BASED_OUTPATIENT_CLINIC_OR_DEPARTMENT_OTHER)
Admission: RE | Admit: 2018-10-15 | Discharge: 2018-10-15 | Disposition: A | Discharge status: Home / Self Care | Payer: Medicare Other | Attending: Urology | Admitting: Urology

## 2018-10-15 ENCOUNTER — Encounter (HOSPITAL_BASED_OUTPATIENT_CLINIC_OR_DEPARTMENT_OTHER): Payer: Self-pay

## 2018-10-15 ENCOUNTER — Encounter (HOSPITAL_BASED_OUTPATIENT_CLINIC_OR_DEPARTMENT_OTHER)
Admission: RE | Disposition: A | Discharge status: Home / Self Care | Payer: Self-pay | Source: Home / Self Care | Attending: Urology

## 2018-10-15 ENCOUNTER — Ambulatory Visit (HOSPITAL_BASED_OUTPATIENT_CLINIC_OR_DEPARTMENT_OTHER): Payer: Medicare Other | Admitting: Physician Assistant

## 2018-10-15 DIAGNOSIS — N183 Chronic kidney disease, stage 3 (moderate): Secondary | ICD-10-CM | POA: Insufficient documentation

## 2018-10-15 DIAGNOSIS — K219 Gastro-esophageal reflux disease without esophagitis: Secondary | ICD-10-CM | POA: Diagnosis not present

## 2018-10-15 DIAGNOSIS — Z7984 Long term (current) use of oral hypoglycemic drugs: Secondary | ICD-10-CM | POA: Diagnosis not present

## 2018-10-15 DIAGNOSIS — Z87891 Personal history of nicotine dependence: Secondary | ICD-10-CM | POA: Insufficient documentation

## 2018-10-15 DIAGNOSIS — Z7982 Long term (current) use of aspirin: Secondary | ICD-10-CM | POA: Diagnosis not present

## 2018-10-15 DIAGNOSIS — N210 Calculus in bladder: Secondary | ICD-10-CM | POA: Diagnosis present

## 2018-10-15 DIAGNOSIS — Z86718 Personal history of other venous thrombosis and embolism: Secondary | ICD-10-CM | POA: Diagnosis not present

## 2018-10-15 DIAGNOSIS — E7800 Pure hypercholesterolemia, unspecified: Secondary | ICD-10-CM | POA: Diagnosis not present

## 2018-10-15 DIAGNOSIS — Z86711 Personal history of pulmonary embolism: Secondary | ICD-10-CM | POA: Diagnosis not present

## 2018-10-15 DIAGNOSIS — F329 Major depressive disorder, single episode, unspecified: Secondary | ICD-10-CM | POA: Insufficient documentation

## 2018-10-15 DIAGNOSIS — Z79899 Other long term (current) drug therapy: Secondary | ICD-10-CM | POA: Diagnosis not present

## 2018-10-15 DIAGNOSIS — E1122 Type 2 diabetes mellitus with diabetic chronic kidney disease: Secondary | ICD-10-CM | POA: Insufficient documentation

## 2018-10-15 DIAGNOSIS — Z8042 Family history of malignant neoplasm of prostate: Secondary | ICD-10-CM | POA: Diagnosis not present

## 2018-10-15 DIAGNOSIS — I129 Hypertensive chronic kidney disease with stage 1 through stage 4 chronic kidney disease, or unspecified chronic kidney disease: Secondary | ICD-10-CM | POA: Diagnosis not present

## 2018-10-15 HISTORY — PX: CYSTOSCOPY WITH LITHOLAPAXY: SHX1425

## 2018-10-15 HISTORY — DX: Other allergic rhinitis: J30.89

## 2018-10-15 HISTORY — DX: Chronic kidney disease, stage 3 unspecified: N18.30

## 2018-10-15 HISTORY — DX: Calculus in bladder: N21.0

## 2018-10-15 HISTORY — DX: Hematuria, unspecified: R31.9

## 2018-10-15 HISTORY — DX: Calculus of kidney: N20.0

## 2018-10-15 HISTORY — DX: Personal history of urinary calculi: Z87.442

## 2018-10-15 HISTORY — DX: Personal history of other specified conditions: Z87.898

## 2018-10-15 HISTORY — DX: Personal history of other diseases of urinary system: Z87.448

## 2018-10-15 HISTORY — DX: Aneurysm of iliac artery: I72.3

## 2018-10-15 LAB — POCT I-STAT 4, (NA,K, GLUC, HGB,HCT)
Glucose, Bld: 179 mg/dL — ABNORMAL HIGH (ref 70–99)
HCT: 34 % — ABNORMAL LOW (ref 39.0–52.0)
Hemoglobin: 11.6 g/dL — ABNORMAL LOW (ref 13.0–17.0)
Potassium: 4 mmol/L (ref 3.5–5.1)
Sodium: 142 mmol/L (ref 135–145)

## 2018-10-15 LAB — GLUCOSE, CAPILLARY: Glucose-Capillary: 197 mg/dL — ABNORMAL HIGH (ref 70–99)

## 2018-10-15 SURGERY — CYSTOSCOPY, WITH BLADDER CALCULUS LITHOLAPAXY
Anesthesia: General | Site: Bladder

## 2018-10-15 MED ORDER — ONDANSETRON HCL 4 MG/2ML IJ SOLN
INTRAMUSCULAR | Status: AC
  Filled 2018-10-15: qty 2

## 2018-10-15 MED ORDER — PROPOFOL 10 MG/ML IV BOLUS
INTRAVENOUS | Status: DC | PRN
  Administered 2018-10-15: 50 mg via INTRAVENOUS
  Administered 2018-10-15: 150 mg via INTRAVENOUS

## 2018-10-15 MED ORDER — ACETAMINOPHEN 10 MG/ML IV SOLN
1000.0000 mg | Freq: Once | INTRAVENOUS | Status: DC | PRN
  Filled 2018-10-15: qty 100

## 2018-10-15 MED ORDER — HYDROCODONE-ACETAMINOPHEN 10-325 MG PO TABS: 1.0000 | ORAL_TABLET | ORAL | 0 refills | Status: DC | PRN

## 2018-10-15 MED ORDER — PHENAZOPYRIDINE HCL 200 MG PO TABS: 200.0000 mg | ORAL_TABLET | Freq: Three times a day (TID) | ORAL | 0 refills | Status: DC | PRN

## 2018-10-15 MED ORDER — PROPOFOL 10 MG/ML IV BOLUS
INTRAVENOUS | Status: AC
  Filled 2018-10-15: qty 40

## 2018-10-15 MED ORDER — DEXAMETHASONE SODIUM PHOSPHATE 10 MG/ML IJ SOLN
INTRAMUSCULAR | Status: DC | PRN
  Administered 2018-10-15: 4 mg via INTRAVENOUS

## 2018-10-15 MED ORDER — CIPROFLOXACIN IN D5W 400 MG/200ML IV SOLN
INTRAVENOUS | Status: AC
  Filled 2018-10-15: qty 200

## 2018-10-15 MED ORDER — ONDANSETRON HCL 4 MG/2ML IJ SOLN
INTRAMUSCULAR | Status: DC | PRN
  Administered 2018-10-15: 4 mg via INTRAVENOUS

## 2018-10-15 MED ORDER — FENTANYL CITRATE (PF) 100 MCG/2ML IJ SOLN
INTRAMUSCULAR | Status: DC | PRN
  Administered 2018-10-15 (×8): 25 ug via INTRAVENOUS

## 2018-10-15 MED ORDER — SODIUM CHLORIDE 0.9 % IR SOLN
Status: DC | PRN
  Administered 2018-10-15: 3000 mL via INTRAVESICAL
  Administered 2018-10-15: 6000 mL via INTRAVESICAL
  Administered 2018-10-15: 3000 mL via INTRAVESICAL

## 2018-10-15 MED ORDER — SODIUM CHLORIDE 0.9 % IV SOLN
INTRAVENOUS | Status: DC
  Administered 2018-10-15: 50 mL/h via INTRAVENOUS
  Administered 2018-10-15: 08:00:00 1000 mL via INTRAVENOUS
  Filled 2018-10-15: qty 1000

## 2018-10-15 MED ORDER — DEXAMETHASONE SODIUM PHOSPHATE 10 MG/ML IJ SOLN
INTRAMUSCULAR | Status: AC
  Filled 2018-10-15: qty 1

## 2018-10-15 MED ORDER — FENTANYL CITRATE (PF) 100 MCG/2ML IJ SOLN
INTRAMUSCULAR | Status: AC
  Filled 2018-10-15: qty 2

## 2018-10-15 MED ORDER — CIPROFLOXACIN IN D5W 400 MG/200ML IV SOLN
400.0000 mg | Freq: Once | INTRAVENOUS | Status: AC
  Administered 2018-10-15: 400 mg via INTRAVENOUS
  Filled 2018-10-15: qty 200

## 2018-10-15 MED ORDER — PROMETHAZINE HCL 25 MG/ML IJ SOLN
6.2500 mg | INTRAMUSCULAR | Status: DC | PRN
  Filled 2018-10-15: qty 1

## 2018-10-15 MED ORDER — LIDOCAINE 2% (20 MG/ML) 5 ML SYRINGE
INTRAMUSCULAR | Status: AC
  Filled 2018-10-15: qty 5

## 2018-10-15 MED ORDER — FENTANYL CITRATE (PF) 100 MCG/2ML IJ SOLN
25.0000 ug | INTRAMUSCULAR | Status: DC | PRN
  Filled 2018-10-15: qty 1

## 2018-10-15 MED ORDER — LIDOCAINE HCL (CARDIAC) PF 100 MG/5ML IV SOSY
PREFILLED_SYRINGE | INTRAVENOUS | Status: DC | PRN
  Administered 2018-10-15: 100 mg via INTRAVENOUS

## 2018-10-15 SURGICAL SUPPLY — 24 items
BAG DRAIN URO-CYSTO SKYTR STRL (DRAIN) ×2 IMPLANT
CATH INTERMIT  6FR 70CM (CATHETERS) IMPLANT
CATH URET 5FR 28IN CONE TIP (BALLOONS)
CATH URET 5FR 70CM CONE TIP (BALLOONS) IMPLANT
CLOTH BEACON ORANGE TIMEOUT ST (SAFETY) ×3 IMPLANT
EVACUATOR MICROVAS BLADDER (UROLOGICAL SUPPLIES) ×1 IMPLANT
FIBER LASER FLEXIVA 1000 (UROLOGICAL SUPPLIES) ×1 IMPLANT
GLOVE BIO SURGEON STRL SZ8 (GLOVE) ×2 IMPLANT
GLOVE BIOGEL PI IND STRL 6 (GLOVE) IMPLANT
GLOVE BIOGEL PI INDICATOR 6 (GLOVE) ×2
GOWN STRL REUS W/ TWL LRG LVL3 (GOWN DISPOSABLE) IMPLANT
GOWN STRL REUS W/ TWL XL LVL3 (GOWN DISPOSABLE) ×1 IMPLANT
GOWN STRL REUS W/TWL LRG LVL3 (GOWN DISPOSABLE) ×2
GOWN STRL REUS W/TWL XL LVL3 (GOWN DISPOSABLE) ×1
GOWN XL W/COTTON TOWEL STD (GOWNS) ×2 IMPLANT
GUIDEWIRE ANG ZIPWIRE 038X150 (WIRE) IMPLANT
GUIDEWIRE STR DUAL SENSOR (WIRE) IMPLANT
IV NS IRRIG 3000ML ARTHROMATIC (IV SOLUTION) ×4 IMPLANT
KIT TURNOVER CYSTO (KITS) ×2 IMPLANT
MANIFOLD NEPTUNE II (INSTRUMENTS) ×1 IMPLANT
NS IRRIG 500ML POUR BTL (IV SOLUTION) IMPLANT
PACK CYSTO (CUSTOM PROCEDURE TRAY) ×2 IMPLANT
TUBE CONNECTING 12X1/4 (SUCTIONS) ×1 IMPLANT
WATER STERILE IRR 3000ML UROMA (IV SOLUTION) ×1 IMPLANT

## 2018-10-15 NOTE — Discharge Instructions (Signed)
Cystoscopy patient instructions  Following a cystoscopy, a catheter (a flexible rubber tube) is sometimes left in place to empty the bladder. This may cause some discomfort or a feeling that you need to urinate. Your doctor determines the period of time that the catheter will be left in place. You may have bloody urine for two to three days (Call your doctor if the amount of bleeding increases or does not subside).  You may pass blood clots in your urine, especially if you had a biopsy. It is not unusual to pass small blood clots and have some bloody urine a couple of weeks after your cystoscopy. Again, call your doctor if the bleeding does not subside. You may have: Dysuria (painful urination) Frequency (urinating often) Urgency (strong desire to urinate)  These symptoms are common especially if medicine is instilled into the bladder or a ureteral stent is placed. Avoiding alcohol and caffeine, such as coffee, tea, and chocolate, may help relieve these symptoms. Drink plenty of water, unless otherwise instructed. Your doctor may also prescribe an antibiotic or other medicine to reduce these symptoms.  Cystoscopy results are available soon after the procedure; biopsy results usually take two to four days. Your doctor will discuss the results of your exam with you. Before you go home, you will be given specific instructions for follow-up care. Special Instructions:  1 If you are going home with a catheter in place do not take a tub bath until removed by your doctor.  2 You may resume your normal activities.  3 Do not drive or operate machinery if you are taking narcotic pain medicine.  4 Be sure to keep all follow-up appointments with your doctor.   5 Call Your Doctor If: The catheter is not draining  You have severe pain  You are unable to urinate  You have a fever over 101  You have severe bleeding           Post Anesthesia Home Care Instructions  Activity: Get plenty of rest for  the remainder of the day. A responsible individual must stay with you for 24 hours following the procedure.  For the next 24 hours, DO NOT: -Drive a car -Operate machinery -Drink alcoholic beverages -Take any medication unless instructed by your physician -Make any legal decisions or sign important papers.  Meals: Start with liquid foods such as gelatin or soup. Progress to regular foods as tolerated. Avoid greasy, spicy, heavy foods. If nausea and/or vomiting occur, drink only clear liquids until the nausea and/or vomiting subsides. Call your physician if vomiting continues.  Special Instructions/Symptoms: Your throat may feel dry or sore from the anesthesia or the breathing tube placed in your throat during surgery. If this causes discomfort, gargle with warm salt water. The discomfort should disappear within 24 hours.        

## 2018-10-15 NOTE — Transfer of Care (Signed)
Immediate Anesthesia Transfer of Care Note  Patient: Joshua Peterson  Procedure(s) Performed: Procedure(s) (LRB): CYSTOSCOPY WITH LITHOLAPAXY (N/A)  Patient Location: PACU  Anesthesia Type: General  Level of Consciousness: awake, sedated, patient cooperative and responds to stimulation  Airway & Oxygen Therapy: Patient Spontanous Breathing and Patient connected to [place] and soft FM  Post-op Assessment: Report given to PACU RN, Post -op Vital signs reviewed and stable and Patient moving all extremities  Post vital signs: Reviewed and stable  Complications: No apparent anesthesia complications

## 2018-10-15 NOTE — Op Note (Signed)
PATIENT:  Joshua Peterson  PRE-OPERATIVE DIAGNOSIS: Bladder calculi  POST-OPERATIVE DIAGNOSIS: Same  PROCEDURE: Cystolitholapaxy (6 cm)  SURGEON:  Claybon Jabs  INDICATION: Joshua Peterson is a 78 year old male who was found to have recurrent bladder stones after presenting with significant voiding symptoms consisting of nocturia 6-7 times and sudden cutting off of his urinary stream as well as discomfort.  He indicated he voided with a strong stream typically.  He was found to have two 3 cm bladder stones and presents for cystolitholapaxy.  ANESTHESIA:  General  EBL:  Minimal  DRAINS: None  LOCAL MEDICATIONS USED:  None  SPECIMEN: Stone given the patient  Description of procedure: After informed consent the patient was taken to the operating room and placed on the table in a supine position. General anesthesia was then administered. Once fully anesthetized the patient was moved to the dorsal lithotomy position and the genitalia were sterilely prepped and draped in standard fashion. An official timeout was then performed.  The 23 French rigid cystoscope with 30 degree lens was advanced under direct vision down the urethra was noted to be normal.  The prostatic urethra revealed some slight bilobar hypertrophy with some enlargement of the median lobe with no lesions.  The bladder was then entered.  It was noted to be free of any worrisome lesions.  The right and left ureteral orifice appeared to be of normal configuration and position.  2 large bladder stones were noted on the floor the bladder.  A 1000 m holmium laser fiber was chosen and passed through the cystoscope and then used to fragment the stones completely.  I intermittently switched to a resectoscope sheath and used this as well as the Microvasive evacuator to remove stone fragments until all stone fragments had been removed from the bladder.  Reinspection of the bladder revealed no injury to the bladder wall.  I therefore drained the  bladder, removed the cystoscope and the patient was awakened and taken to the recovery room in stable and satisfactory condition.  He tolerated the procedure well with no intraoperative complications.    PLAN OF CARE: Discharge to home after PACU  PATIENT DISPOSITION:  PACU - hemodynamically stable.

## 2018-10-15 NOTE — Anesthesia Procedure Notes (Signed)
Procedure Name: LMA Insertion Date/Time: 10/15/2018 9:27 AM Performed by: Justice Rocher, CRNA Pre-anesthesia Checklist: Patient identified, Emergency Drugs available, Suction available and Patient being monitored Patient Re-evaluated:Patient Re-evaluated prior to induction Oxygen Delivery Method: Circle system utilized Preoxygenation: Pre-oxygenation with 100% oxygen Induction Type: IV induction Ventilation: Mask ventilation without difficulty LMA: LMA inserted LMA Size: 5.0 Number of attempts: 1 Airway Equipment and Method: Bite block Placement Confirmation: positive ETCO2 and breath sounds checked- equal and bilateral Tube secured with: Tape Dental Injury: Teeth and Oropharynx as per pre-operative assessment

## 2018-10-15 NOTE — Anesthesia Postprocedure Evaluation (Signed)
Anesthesia Post Note  Patient: Joshua Peterson  Procedure(s) Performed: CYSTOSCOPY WITH LITHOLAPAXY (N/A Bladder)     Patient location during evaluation: PACU Anesthesia Type: General Level of consciousness: awake and alert Pain management: pain level controlled Vital Signs Assessment: post-procedure vital signs reviewed and stable Respiratory status: spontaneous breathing, nonlabored ventilation and respiratory function stable Cardiovascular status: blood pressure returned to baseline and stable Postop Assessment: no apparent nausea or vomiting Anesthetic complications: no    Last Vitals:  Vitals:   10/15/18 1100 10/15/18 1115  BP: (!) 104/50 (!) 108/40  Pulse: 65 64  Resp: 13 10  Temp:    SpO2: 97% 97%    Last Pain:  Vitals:   10/15/18 1040  TempSrc:   PainSc: 0-No pain                 Brennan Bailey

## 2018-10-16 ENCOUNTER — Ambulatory Visit: Payer: Medicare Other | Admitting: Podiatry

## 2018-10-16 ENCOUNTER — Encounter (HOSPITAL_BASED_OUTPATIENT_CLINIC_OR_DEPARTMENT_OTHER): Payer: Self-pay | Admitting: Urology

## 2018-10-25 ENCOUNTER — Other Ambulatory Visit: Payer: Self-pay

## 2018-10-25 ENCOUNTER — Ambulatory Visit (INDEPENDENT_AMBULATORY_CARE_PROVIDER_SITE_OTHER): Payer: Medicare Other | Admitting: Podiatry

## 2018-10-25 DIAGNOSIS — Z79899 Other long term (current) drug therapy: Secondary | ICD-10-CM

## 2018-10-25 DIAGNOSIS — L603 Nail dystrophy: Secondary | ICD-10-CM

## 2018-10-25 MED ORDER — TERBINAFINE HCL 250 MG PO TABS: 250.0000 mg | ORAL_TABLET | Freq: Every day | ORAL | 0 refills | Status: DC

## 2018-10-25 NOTE — Progress Notes (Signed)
He presents today for follow-up of his pathology report.  Objective: Vital signs are stable he is alert and oriented x3 no change in his physical exam.  Pathology report does demonstrate onychomycosis with a dermatophyte known is T rubrum.  I reviewed his past complete metabolic panel which appears to be within normal limits.  Assessment: Onychomycosis.  Plan: We discussed in great detail today the use of laser therapy versus oral therapy he would like to try oral therapy since it worked for him once before.  He was states that if this does not work then he will use laser therapy at that point.  We discussed the pros and cons of the oral therapy and the risks involved he understands that and is amenable to it.  At this point we do not have to be blood work since he just had 1 done July 8 and it was a perfect concentrations.  I will follow-up with him in third 30 days we went ahead and wrote him a prescription for Lamisil 250 mg tablets 1 p.o. daily

## 2018-10-25 NOTE — Patient Instructions (Signed)
Terbinafine tablets What is this medicine? TERBINAFINE (TER bin a feen) is an antifungal medicine. It is used to treat certain kinds of fungal or yeast infections. This medicine may be used for other purposes; ask your health care provider or pharmacist if you have questions. COMMON BRAND NAME(S): Lamisil, Terbinex What should I tell my health care provider before I take this medicine? They need to know if you have any of these conditions:  drink alcoholic beverages  kidney disease  liver disease  an unusual or allergic reaction to terbinafine, other medicines, foods, dyes, or preservatives  pregnant or trying to get pregnant  breast-feeding How should I use this medicine? Take this medicine by mouth with a full glass of water. Follow the directions on the prescription label. You can take this medicine with food or on an empty stomach. Take your medicine at regular intervals. Do not take your medicine more often than directed. Do not skip doses or stop your medicine early even if you feel better. Do not stop taking except on your doctor's advice. Talk to your pediatrician regarding the use of this medicine in children. Special care may be needed. Overdosage: If you think you have taken too much of this medicine contact a poison control center or emergency room at once. NOTE: This medicine is only for you. Do not share this medicine with others. What if I miss a dose? If you miss a dose, take it as soon as you can. If it is almost time for your next dose, take only that dose. Do not take double or extra doses. What may interact with this medicine? Do not take this medicine with any of the following medications:  thioridazine This medicine may also interact with the following medications:  beta-blockers  caffeine  cimetidine  cyclosporine  medicines for depression, anxiety, or psychotic disturbances  medicines for fungal infections like fluconazole and ketoconazole  medicines  for irregular heartbeat like amiodarone, flecainide and propafenone  rifampin  warfarin This list may not describe all possible interactions. Give your health care provider a list of all the medicines, herbs, non-prescription drugs, or dietary supplements you use. Also tell them if you smoke, drink alcohol, or use illegal drugs. Some items may interact with your medicine. What should I watch for while using this medicine? Visit your doctor or health care provider regularly. Tell your doctor right away if you have nausea or vomiting, loss of appetite, stomach pain on your right upper side, yellow skin, dark urine, light stools, or are over tired. Some fungal infections need many weeks or months of treatment to cure. If you are taking this medicine for a long time, you will need to have important blood work done. This medicine may cause serious skin reactions. They can happen weeks to months after starting the medicine. Contact your health care provider right away if you notice fevers or flu-like symptoms with a rash. The rash may be red or purple and then turn into blisters or peeling of the skin. Or, you might notice a red rash with swelling of the face, lips or lymph nodes in your neck or under your arms. What side effects may I notice from receiving this medicine? Side effects that you should report to your doctor or health care professional as soon as possible:  allergic reactions like skin rash or hives, swelling of the face, lips, or tongue  changes in vision  dark urine  fever or infection  general ill feeling or flu-like symptoms    light-colored stools  loss of appetite, nausea  rash, fever, and swollen lymph nodes  redness, blistering, peeling or loosening of the skin, including inside the mouth  right upper belly pain  unusually weak or tired  yellowing of the eyes or skin Side effects that usually do not require medical attention (report to your doctor or health care  professional if they continue or are bothersome):  changes in taste  diarrhea  hair loss  muscle or joint pain  stomach gas  stomach upset This list may not describe all possible side effects. Call your doctor for medical advice about side effects. You may report side effects to FDA at 1-800-FDA-1088. Where should I keep my medicine? Keep out of the reach of children. Store at room temperature below 25 degrees C (77 degrees F). Protect from light. Throw away any unused medicine after the expiration date. NOTE: This sheet is a summary. It may not cover all possible information. If you have questions about this medicine, talk to your doctor, pharmacist, or health care provider.  2020 [employer] Standard (2018-06-15 15:37:07)  

## 2018-10-31 ENCOUNTER — Encounter: Payer: Self-pay | Admitting: Internal Medicine

## 2018-10-31 DIAGNOSIS — N138 Other obstructive and reflux uropathy: Secondary | ICD-10-CM | POA: Insufficient documentation

## 2018-10-31 DIAGNOSIS — E782 Mixed hyperlipidemia: Secondary | ICD-10-CM | POA: Insufficient documentation

## 2018-10-31 NOTE — Progress Notes (Signed)
Annual  Screening/Preventative Visit  & Comprehensive Evaluation & Examination     This very nice 78 y.o. MWM presents for a Screening /Preventative Visit & comprehensive evaluation and management of multiple medical co-morbidities.  Patient has been followed for HTN, HLD, T2_NIDDM  and Vitamin D Deficiency. He also has a R iliac Aneursym being followed by Dr Trula Slade.     Labile HTN predates since 2004. Patient's BP has been controlled at home.  Today's BP was at goal -  118/78. Patient denies any cardiac symptoms as chest pain, palpitations, shortness of breath, dizziness or ankle swelling.     Patient's hyperlipidemia is controlled with diet and medications. Patient denies myalgias or other medication SE's. 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 Last A1c was not at goal and his Metformin ha been tapered due to declining kidney function: Lab Results  Component Value Date   HGBA1C 7.9 (H) 11/01/2018       Finally, patient has history of Vitamin D Deficiency ("24"/2008) and last vitamin D was still low: Lab Results  Component Value Date   VD25OH 43 11/01/2018   Current Outpatient Medications on File Prior to Visit  Medication Sig  . albuterol (VENTOLIN HFA) 108 (90 Base) MCG/ACT inhaler   . aspirin 81 MG tablet Take 81 mg by mouth daily.  . Cholecalciferol (VITAMIN D3) 5000 units CAPS Take 2 capsules (10,000 Units total) by mouth daily. (Patient taking differently: Take 2 capsules by mouth daily. )  . Chromium-Cinnamon (CINNAMON PLUS CHROMIUM) [phone] MCG-MG CAPS Take 2 capsules by mouth daily.  . Cyanocobalamin (B-12 PO) Take by mouth  daily.  . famotidine (PEPCID) 40 MG tablet Take 40 mg by mouth as needed.   . fenofibrate (TRICOR) 145 MG tablet Take 1 tablet Daily for Triglycerides (Blood Fats) (Patient taking differently: Take 145 mg by mouth daily. Take 1 tablet Daily for Triglycerides (Blood Fats))  . fexofenadine (ALLEGRA) 180 MG tablet Take 1 tablet Daily for Allergies (Patient taking differently: Take 180 mg by mouth daily as needed. Take 1 tablet Daily for Allergies)  . fluticasone (FLONASE) 50 MCG/ACT nasal spray Place 1 spray into both nostrils daily as needed.   Marland Kitchen glipiZIDE (GLUCOTROL) 5 MG tablet One pill daily WITH largest meal, stop if any low sugars, only take with food  . glucose blood test strip   . losartan (COZAAR) 100 MG tablet Take 100 mg by mouth daily.   . metFORMIN (GLUCOPHAGE-XR) 500 MG 24 hr tablet TAKE 2 TABLET BY MOUTH 2 TIMES DAILY WITH THE LARGEST MEALS. (Patient taking differently: Take 100 mg by mouth daily with breakfast. TAKE 2 TABLET BY MOUTH 2 TIMES DAILY WITH THE LARGEST MEALS.)  . Multiple Vitamins-Minerals (MEGA MULTIVITAMIN FOR MEN) TABS Take 1 tablet by mouth daily.  Marland Kitchen PROAIR HFA 108 (90 Base) MCG/ACT inhaler   . rosuvastatin (CRESTOR) 5 MG tablet Take 5 mg by mouth daily.   . tamsulosin (FLOMAX) 0.4 MG CAPS capsule Take 0.4 mg by mouth daily.   Marland Kitchen terbinafine (LAMISIL) 250 MG tablet Take 1 tablet (250 mg total) by mouth daily.   No current facility-administered medications on  file prior to visit.    Allergies  Allergen Reactions  . Doxycycline     unknown  . Niacin And Related Other (See Comments)    flushing  . Sulfa Antibiotics Itching   Past Medical History:  Diagnosis Date  . Allergic rhinitis   . Bladder stones   . CKD (chronic kidney disease), stage III ([place])   . Common iliac aneurysm (HCC) right side   followed by vascular-- dr Trula Slade (note in epic)  . Environmental and seasonal allergies   . External carotid artery stenosis     per last duplex 05-18-2011  right  moderate stenosis and left moderate to severe  . Hematuria   . History of adenomatous polyp of colon    tubular adenoma's  . History of bladder stone   . History of DVT in adulthood    08/ 2009 right lower extremity;  2013  left lower extremity  (10-11-2018 per pt no dvt since 2013)  . History of kidney stones   . History of pulmonary embolism    bilatera lung  08/ 2009  (10-11-2018  per pt no pe since 2009)  . History of wheezing    allergies--- prn inhaler  . Hypertension   . Hypogonadism male   . Internal carotid artery stenosis, bilateral     per last duplex 05-18-2011 in epic,   bilateral 40-59% stenosis  (10-11-2018  per pt denies stroke/tia S&S)  . Lower urinary tract symptoms (LUTS)   . Mixed hyperlipidemia   . Nephrolithiasis   . Type 2 diabetes mellitus ([place])   . Vitamin D deficiency   . Wears glasses    Health Maintenance  Topic Date Due  . INFLUENZA VACCINE  10/20/2018  . HEMOGLOBIN A1C  05/04/2019  . OPHTHALMOLOGY EXAM  08/07/2019  . FOOT EXAM  10/31/2019  . COLONOSCOPY  10/25/2020  . TETANUS/TDAP  12/30/2026  . PNA vac Low Risk Adult  Completed   Immunization History  Administered Date(s) Administered  . Influenza, High Dose Seasonal PF 01/14/2015, 11/26/2015, 12/29/2016, 12/18/2017  . Pneumococcal Conjugate-13 01/14/2015  . Pneumococcal-Unspecified 04/02/2008  . Td 04/02/2006, 12/29/2016  . Zoster 04/03/2007   Last Colon - 10/25/2017 - Dr Havery Moros - Recc 3 yr f/u due Aug 2022  Past Surgical History:  Procedure Laterality Date  . COLONOSCOPY  last one 10-25-2017  . CYSTOSCOPY WITH LITHOLAPAXY N/A 07/18/2016   Procedure: CYSTOSCOPY WITH LITHOLAPAXY;  Surgeon: Kathie Rhodes, MD;  Location[facility];  Service: Urology;  Laterality: N/A;  . CYSTOSCOPY WITH LITHOLAPAXY N/A 10/15/2018   Procedure: CYSTOSCOPY WITH LITHOLAPAXY;  Surgeon: Kathie Rhodes, MD;  Location: [facility];  Service: Urology;  Laterality: N/A;  . IVC FILTER  INSERTION  11/21/2007  . IVC FILTER REMOVAL  01/31/2008  . 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 RIGHT CHEEK/MOUTH BIOPSY  09/16/2008  (office)   squamous hyperplasia w/ hyperkeratosis  . RIGHT DEEP CERVICAL NODE BIOPSY  01/03/2001   right posterior neck (per path report-- lymphoid hyperplasia w/ florid follicular hyperplasia, no malignancy(  . TONSILLECTOMY  age 38   Family History  Problem Relation Age of Onset  . Alzheimer's disease Mother   . Diabetes Mother   . Prostate cancer Father   . Breast cancer Sister   . Colon cancer Neg Hx   . Rectal cancer Neg Hx   . Stomach cancer Neg Hx   . Esophageal cancer Neg Hx    Social History   Socioeconomic  History  . Marital status: Married    Spouse name: Not on file  . Number of children: Not on file  Occupational History  . retired  Tobacco Use  . Smoking status: Former Smoker    Packs/day: 1.00    Years: 20.00    Pack years: 20.00    Types: Cigarettes    Quit date: 04/16/1991    Years since quitting: 27.5  . Smokeless tobacco: Never Used  Substance and Sexual Activity  . Alcohol use: Yes    Comment: ocassional   . Drug use: Never  . Sexual activity: Not on file    ROS Constitutional: Denies fever, chills, weight loss/gain, headaches, insomnia,  night sweats or change in appetite. Does c/o fatigue. Eyes: Denies redness, blurred vision, diplopia, discharge, itchy or watery eyes.  ENT: Denies discharge, congestion, post nasal drip, epistaxis, sore throat, earache, hearing loss, dental pain, Tinnitus, Vertigo, Sinus pain or snoring.  Cardio: Denies chest pain, palpitations, irregular heartbeat, syncope, dyspnea, diaphoresis, orthopnea, PND, claudication or edema Respiratory: denies cough, dyspnea, DOE, pleurisy, hoarseness, laryngitis or wheezing.  Gastrointestinal: Denies dysphagia, heartburn, reflux, water brash, pain, cramps, nausea, vomiting, bloating, diarrhea, constipation, hematemesis, melena,  hematochezia, jaundice or hemorrhoids Genitourinary: Denies dysuria, frequency, discharge, hematuria or flank pain. Has urgency, nocturia x 2-3 & occasional hesitancy. Musculoskeletal: Denies arthralgia, myalgia, stiffness, Jt. Swelling, pain, limp or strain/sprain. Denies Falls. Skin: Denies puritis, rash, hives, warts, acne, eczema or change in skin lesion Neuro: No weakness, tremor, incoordination, spasms, paresthesia or pain Psychiatric: Denies confusion, memory loss or sensory loss. Denies Depression. Endocrine: Denies change in weight, skin, hair change, nocturia, and paresthesia, diabetic polys, visual blurring or hyper / hypo glycemic episodes.  Heme/Lymph: No excessive bleeding, bruising or enlarged lymph nodes.  Physical Exam  BP 118/78   Pulse 64   Temp (!) 97.5 F (36.4 C)   Resp 16   Ht 5\' 11"  (1.803 m)   Wt 213 lb 6.4 oz (96.8 kg)   BMI 29.76 kg/m   General Appearance: Well nourished and well groomed and in no apparent distress.  Eyes: PERRLA, EOMs, conjunctiva no swelling or erythema, normal fundi and vessels. Sinuses: No frontal/maxillary tenderness ENT/Mouth: EACs patent / TMs  nl. Nares clear without erythema, swelling, mucoid exudates. Oral hygiene is good. No erythema, swelling, or exudate. Tongue normal, non-obstructing. Tonsils not swollen or erythematous. Hearing normal.  Neck: Supple, thyroid not palpable. No bruits, nodes or JVD. Respiratory: Respiratory effort normal.  BS equal and clear bilateral without rales, rhonci, wheezing or stridor. Cardio: Heart sounds are normal with regular rate and rhythm and no murmurs, rubs or gallops. Peripheral pulses are normal and equal bilaterally without edema. No aortic or femoral bruits. Chest: symmetric with normal excursions and percussion.  Abdomen: Soft, with Nl bowel sounds. Nontender, no guarding, rebound, hernias, masses, or organomegaly.  Lymphatics: Non tender without lymphadenopathy.  Musculoskeletal: Full ROM  all peripheral extremities, joint stability, 5/5 strength, and normal gait. Skin: Warm and dry without rashes, lesions, cyanosis, clubbing or  ecchymosis.  Neuro: Cranial nerves intact, reflexes equal bilaterally. Normal muscle tone, no cerebellar symptoms. Sensation intact to touch, vibratory and Monofilament to the toes bilaterally.   Pysch: Alert and oriented X 3 with normal affect, insight and judgment appropriate.   Assessment and Plan  1. Annual Preventative/Screening Exam   2. Labile hypertension  - EKG 12-Lead - [place], RETROPERITNL ABD,  LTD - Urinalysis, Routine w reflex microscopic - Microalbumin / creatinine urine ratio - CBC with  Differential/Platelet - COMPLETE METABOLIC PANEL WITH GFR - Magnesium - TSH  3. Hyperlipidemia, mixed  - EKG 12-Lead - [place], RETROPERITNL ABD,  LTD - Lipid panel - TSH  4. Type 2 diabetes mellitus with stage 3 chronic kidney disease, without long-term current use of insulin (HCC)  - EKG 12-Lead - [place], RETROPERITNL ABD,  LTD - Urinalysis, Routine w reflex microscopic - Microalbumin / creatinine urine ratio - HM DIABETES FOOT EXAM - LOW EXTREMITY NEUR EXAM DOCUM - PTH, intact and calcium - Hemoglobin A1c - Insulin, random  5. Vitamin D deficiency  - VITAMIN D 25 Hydroxyl  6. Gastroesophageal reflux disease  - CBC with Differential/Platelet  7. BPH with obstruction/lower urinary tract symptoms  - PSA  8. Prostate cancer screening  - PSA  9. Screening for colorectal cancer  - POC Hemoccult Bld/Stl   10. Screening for ischemic heart disease  - EKG 12-Lead - [place], RETROPERITNL ABD,  LTD  11. Former smoker  - EKG 12-Lead - [place], RETROPERITNL ABD,  LTD  12. Screening for AAA (aortic abdominal aneurysm)  - [place], RETROPERITNL ABD,  LTD  13. Medication management  - Urinalysis, Routine w reflex microscopic - Microalbumin / creatinine urine ratio - CBC with Differential/Platelet - COMPLETE METABOLIC PANEL WITH GFR -  Magnesium - Lipid panel - TSH - Hemoglobin A1c - Insulin, random - VITAMIN D 25 Hydroxyl       Patient was counseled in prudent diet, weight control to achieve/maintain BMI less than 25, BP monitoring, regular exercise and medications as discussed.  Discussed med effects and SE's. Routine screening labs and tests as requested with regular follow-up as recommended. Over 40 minutes of exam, counseling, chart review and high complex critical decision making was performed   Kirtland Bouchard, MD

## 2018-10-31 NOTE — Patient Instructions (Signed)

## 2018-11-01 ENCOUNTER — Other Ambulatory Visit: Payer: Self-pay

## 2018-11-01 ENCOUNTER — Ambulatory Visit (INDEPENDENT_AMBULATORY_CARE_PROVIDER_SITE_OTHER): Payer: Medicare Other | Admitting: Internal Medicine

## 2018-11-01 VITALS — BP 118/78 | HR 64 | Temp 97.5°F | Resp 16 | Ht 71.0 in | Wt 213.4 lb

## 2018-11-01 DIAGNOSIS — I10 Essential (primary) hypertension: Secondary | ICD-10-CM | POA: Diagnosis not present

## 2018-11-01 DIAGNOSIS — E1122 Type 2 diabetes mellitus with diabetic chronic kidney disease: Secondary | ICD-10-CM

## 2018-11-01 DIAGNOSIS — N138 Other obstructive and reflux uropathy: Secondary | ICD-10-CM

## 2018-11-01 DIAGNOSIS — E782 Mixed hyperlipidemia: Secondary | ICD-10-CM

## 2018-11-01 DIAGNOSIS — R0989 Other specified symptoms and signs involving the circulatory and respiratory systems: Secondary | ICD-10-CM | POA: Diagnosis not present

## 2018-11-01 DIAGNOSIS — Z125 Encounter for screening for malignant neoplasm of prostate: Secondary | ICD-10-CM

## 2018-11-01 DIAGNOSIS — E559 Vitamin D deficiency, unspecified: Secondary | ICD-10-CM

## 2018-11-01 DIAGNOSIS — Z136 Encounter for screening for cardiovascular disorders: Secondary | ICD-10-CM

## 2018-11-01 DIAGNOSIS — Z0000 Encounter for general adult medical examination without abnormal findings: Secondary | ICD-10-CM

## 2018-11-01 DIAGNOSIS — Z0001 Encounter for general adult medical examination with abnormal findings: Secondary | ICD-10-CM

## 2018-11-01 DIAGNOSIS — Z79899 Other long term (current) drug therapy: Secondary | ICD-10-CM

## 2018-11-01 DIAGNOSIS — Z1211 Encounter for screening for malignant neoplasm of colon: Secondary | ICD-10-CM

## 2018-11-01 DIAGNOSIS — Z87891 Personal history of nicotine dependence: Secondary | ICD-10-CM

## 2018-11-01 DIAGNOSIS — K219 Gastro-esophageal reflux disease without esophagitis: Secondary | ICD-10-CM

## 2018-11-02 LAB — LIPID PANEL
Cholesterol: 121 mg/dL (ref <200)
HDL: 24 mg/dL — ABNORMAL LOW (ref >=40)
LDL Cholesterol (Calc): 60 mg/dL (calc)
Non-HDL Cholesterol (Calc): 97 mg/dL (calc) (ref <130)
Total CHOL/HDL Ratio: 5 (calc) — ABNORMAL HIGH (ref <5.0)
Triglycerides: 348 mg/dL — ABNORMAL HIGH (ref <150)

## 2018-11-02 LAB — COMPLETE METABOLIC PANEL WITH GFR
AG Ratio: 2 (calc) (ref 1.0–2.5)
ALT: 13 U/L (ref 9–46)
AST: 13 U/L (ref 10–35)
Albumin: 4.5 g/dL (ref 3.6–5.1)
Alkaline phosphatase (APISO): 52 U/L (ref 35–144)
BUN/Creatinine Ratio: 17 (calc) (ref 6–22)
BUN: 26 mg/dL — ABNORMAL HIGH (ref 7–25)
CO2: 23 mmol/L (ref 20–32)
Calcium: 10.1 mg/dL (ref 8.6–10.3)
Chloride: 106 mmol/L (ref 98–110)
Creat: 1.54 mg/dL — ABNORMAL HIGH (ref 0.70–1.18)
GFR, Est African American: 49 mL/min/{1.73_m2} — ABNORMAL LOW (ref >=60)
GFR, Est Non African American: 43 mL/min/{1.73_m2} — ABNORMAL LOW (ref >=60)
Globulin: 2.3 g/dL (calc) (ref 1.9–3.7)
Glucose, Bld: 157 mg/dL — ABNORMAL HIGH (ref 65–99)
Potassium: 4.1 mmol/L (ref 3.5–5.3)
Sodium: 139 mmol/L (ref 135–146)
Total Bilirubin: 0.6 mg/dL (ref 0.2–1.2)
Total Protein: 6.8 g/dL (ref 6.1–8.1)

## 2018-11-02 LAB — CBC WITH DIFFERENTIAL/PLATELET
Absolute Monocytes: 347 cells/uL (ref 200–950)
Basophils Absolute: 50 cells/uL (ref 0–200)
Basophils Relative: 0.8 %
Eosinophils Absolute: 143 cells/uL (ref 15–500)
Eosinophils Relative: 2.3 %
HCT: 37 % — ABNORMAL LOW (ref 38.5–50.0)
Hemoglobin: 12.6 g/dL — ABNORMAL LOW (ref 13.2–17.1)
Lymphs Abs: 1637 cells/uL (ref 850–3900)
MCH: 31 pg (ref 27.0–33.0)
MCHC: 34.1 g/dL (ref 32.0–36.0)
MCV: 91.1 fL (ref 80.0–100.0)
MPV: 10 fL (ref 7.5–12.5)
Monocytes Relative: 5.6 %
Neutro Abs: 4024 cells/uL (ref 1500–7800)
Neutrophils Relative %: 64.9 %
Platelets: 168 10*3/uL (ref 140–400)
RBC: 4.06 10*6/uL — ABNORMAL LOW (ref 4.20–5.80)
RDW: 12.7 % (ref 11.0–15.0)
Total Lymphocyte: 26.4 %
WBC: 6.2 10*3/uL (ref 3.8–10.8)

## 2018-11-02 LAB — URINALYSIS, ROUTINE W REFLEX MICROSCOPIC
Bacteria, UA: NONE SEEN /HPF
Bilirubin Urine: NEGATIVE
Hgb urine dipstick: NEGATIVE
Hyaline Cast: NONE SEEN /LPF
Ketones, ur: NEGATIVE
Nitrite: NEGATIVE
Protein, ur: NEGATIVE
RBC / HPF: NONE SEEN /HPF (ref 0–2)
Specific Gravity, Urine: 1.014 (ref 1.001–1.03)
Squamous Epithelial / HPF: NONE SEEN /HPF (ref <=5)
pH: <=5 (ref 5.0–8.0)

## 2018-11-02 LAB — VITAMIN D 25 HYDROXY (VIT D DEFICIENCY, FRACTURES): Vit D, 25-Hydroxy: 43 ng/mL (ref 30–100)

## 2018-11-02 LAB — PTH, INTACT AND CALCIUM
Calcium: 10.1 mg/dL (ref 8.6–10.3)
PTH: 26 pg/mL (ref 14–64)

## 2018-11-02 LAB — MICROALBUMIN / CREATININE URINE RATIO
Creatinine, Urine: 74 mg/dL (ref 20–320)
Microalb Creat Ratio: 7 mcg/mg creat (ref <30)
Microalb, Ur: 0.5 mg/dL

## 2018-11-02 LAB — TSH: TSH: 1.54 mIU/L (ref 0.40–4.50)

## 2018-11-02 LAB — MAGNESIUM: Magnesium: 2 mg/dL (ref 1.5–2.5)

## 2018-11-02 LAB — HEMOGLOBIN A1C
Hgb A1c MFr Bld: 7.9 % of total Hgb — ABNORMAL HIGH (ref <5.7)
Mean Plasma Glucose: 180 (calc)
eAG (mmol/L): 10 (calc)

## 2018-11-02 LAB — INSULIN, RANDOM: Insulin: 43.1 u[IU]/mL — ABNORMAL HIGH

## 2018-11-02 LAB — PSA: PSA: 0.2 ng/mL (ref <=4.0)

## 2018-11-03 ENCOUNTER — Encounter: Payer: Self-pay | Admitting: Internal Medicine

## 2018-11-22 ENCOUNTER — Other Ambulatory Visit: Payer: Self-pay | Admitting: Internal Medicine

## 2018-11-22 ENCOUNTER — Encounter: Payer: Self-pay | Admitting: Podiatry

## 2018-11-22 ENCOUNTER — Other Ambulatory Visit: Payer: Self-pay

## 2018-11-22 ENCOUNTER — Ambulatory Visit (INDEPENDENT_AMBULATORY_CARE_PROVIDER_SITE_OTHER): Payer: Medicare Other | Admitting: Podiatry

## 2018-11-22 DIAGNOSIS — Z79899 Other long term (current) drug therapy: Secondary | ICD-10-CM

## 2018-11-22 MED ORDER — TERBINAFINE HCL 250 MG PO TABS: 250.0000 mg | ORAL_TABLET | Freq: Every day | ORAL | 0 refills | Status: DC

## 2018-11-22 NOTE — Progress Notes (Signed)
He presents today for bilateral nail fungus he states that he taken his first 30 days of pills denies fever chills nausea vomiting muscle aches pains itching or rashes.  Objective: No change in physical exam of the nail plates yet.  Sharply rated nail margin along the fibular border of the hallux left.  Assessment: Pain limb secondary to onychomycosis long-term therapy with Lamisil.  Ingrown nail fibular border hallux left.  Plan: Debridement of hallux left.  Also started him on another 90 days worth of Lamisil.  Also requested a liver profile and send him with a requisition today.  I will follow-up with him in the near future otherwise we will notify him should his blood work come back abnormal.

## 2018-11-23 LAB — HEPATIC FUNCTION PANEL
AG Ratio: 2 (calc) (ref 1.0–2.5)
ALT: 14 U/L (ref 9–46)
AST: 12 U/L (ref 10–35)
Albumin: 4.5 g/dL (ref 3.6–5.1)
Alkaline phosphatase (APISO): 53 U/L (ref 35–144)
Bilirubin, Direct: 0.1 mg/dL (ref 0.0–0.2)
Globulin: 2.3 g/dL (calc) (ref 1.9–3.7)
Indirect Bilirubin: 0.4 mg/dL (calc) (ref 0.2–1.2)
Total Bilirubin: 0.5 mg/dL (ref 0.2–1.2)
Total Protein: 6.8 g/dL (ref 6.1–8.1)

## 2018-11-27 ENCOUNTER — Other Ambulatory Visit: Payer: Self-pay | Admitting: Physician Assistant

## 2018-11-27 DIAGNOSIS — E1122 Type 2 diabetes mellitus with diabetic chronic kidney disease: Secondary | ICD-10-CM

## 2018-11-28 ENCOUNTER — Telehealth: Payer: Self-pay | Admitting: *Deleted

## 2018-11-28 NOTE — Telephone Encounter (Signed)
Left message informing pt Dr. Milinda Pointer had reviewed results and he could take his medication as instructed.

## 2018-11-28 NOTE — Telephone Encounter (Signed)
-----   Message from Garrel Ridgel, [place] sent at 11/28/2018  7:05 AM EDT ----- Blood work looks perfect and may continue medication.

## 2018-12-04 ENCOUNTER — Other Ambulatory Visit: Payer: Self-pay | Admitting: Physician Assistant

## 2018-12-04 DIAGNOSIS — E1122 Type 2 diabetes mellitus with diabetic chronic kidney disease: Secondary | ICD-10-CM

## 2018-12-04 MED ORDER — GLIPIZIDE 10 MG PO TABS: ORAL_TABLET | ORAL | 2 refills | Status: DC

## 2018-12-12 ENCOUNTER — Other Ambulatory Visit: Payer: Self-pay | Admitting: Adult Health

## 2019-01-09 ENCOUNTER — Other Ambulatory Visit: Payer: Self-pay | Admitting: Internal Medicine

## 2019-01-22 ENCOUNTER — Other Ambulatory Visit: Payer: Self-pay | Admitting: Internal Medicine

## 2019-01-22 DIAGNOSIS — E1122 Type 2 diabetes mellitus with diabetic chronic kidney disease: Secondary | ICD-10-CM

## 2019-01-22 DIAGNOSIS — N182 Chronic kidney disease, stage 2 (mild): Secondary | ICD-10-CM

## 2019-01-22 MED ORDER — GLIPIZIDE 10 MG PO TABS: ORAL_TABLET | ORAL | 3 refills | Status: DC

## 2019-02-04 ENCOUNTER — Encounter: Payer: Self-pay | Admitting: Adult Health

## 2019-02-04 NOTE — Progress Notes (Deleted)
FOLLOW UP  Assessment and Plan:   Aneurysm of left iliac artery Control BP; followed annually by vascular Dr. Trula Slade  Hypertension Well controlled with current medications  Monitor blood pressure at home; patient to call if consistently greater than 130/80 Continue DASH diet.   Reminder to go to the ER if any CP, SOB, nausea, dizziness, severe HA, changes vision/speech, left arm numbness and tingling and jaw pain.  Hyperlipidemia associated with T2DM (HCC) Continue rosuvastatin 5 mg (LDL at goal <70), continue fish oil and fenofibrate for trigs Continue low cholesterol diet and exercise.  Check lipid panel.   Diabetes with diabetic chronic kidney disease ([name]) Continue medication: metformin Continue diet and exercise.  Perform daily foot/skin check, notify office of any concerning changes.  Check A1C  CKD IIIb associated with T2DM (HCC) Increase fluids, avoid NSAIDS, monitor sugars, will monitor  Obesity with co morbidities Long discussion about weight loss, diet, and exercise Recommended diet heavy in fruits and veggies and low in animal meats, cheeses, and dairy products, appropriate calorie intake Discussed ideal weight for height (below 180 lb) and initial weight goal (215lb) Patient will work on lower carb diet - suggested low GI, modified keto, information provided for eating out Keep a daily low of food and bring to next appoint Will follow up in 3 months  Vitamin D Def Below goal at last visit; he has ***changed dose continue supplementation to maintain goal of 60-100 Defer Vit D level  Continue diet and meds as discussed. Further disposition pending results of labs. Discussed med's effects and SE's.   Over 30 minutes of exam, counseling, chart review, and critical decision making was performed.   Future Appointments  Date Time Provider [place]  02/05/2019 10:30 AM Liane Comber, Joshua Peterson [facility] None  05/13/2019 10:30 AM Unk Pinto, MD  [facility] None  08/12/2019 10:00 AM Vicie Mutters, PA-C [facility] None  12/16/2019  3:00 PM Unk Pinto, MD [facility] None    ----------------------------------------------------------------------------------------------------------------------  HPI 78 y.o. male  presents for 3 month follow up on hypertension, cholesterol, diabetes, weight and vitamin D deficiency.   He is following with Dr. Milinda Pointer for toe nail fungus on lamisil therapy. ***  BMI is There is no height or weight on file to calculate BMI., he has not been working on diet and exercise, admits he is stressed and eating entirely too much, struggling with appetite. Eating out every evening and eating late due to his wife. He is interested in the keto diet.  Wt Readings from Last 3 Encounters:  11/01/18 213 lb 6.4 oz (96.8 kg)  10/15/18 218 lb (98.9 kg)  09/26/18 224 lb 6.4 oz (101.8 kg)   He has right common iliac artery aneurysm that was found incidentally on a CT scan in 2018 for hematuria.  At that time, maximum diameter was 2 cm. He follows annually with Dr. Trula Slade.  His blood pressure has been controlled at home, today their BP is    He does not workout. He denies chest pain, shortness of breath, dizziness.   He is on cholesterol medication Rosuvastatin 5 mg daily and Fenofibrate *** and fish oil and denies myalgias. His LDL cholesterol is at goal. The cholesterol last visit was:   Lab Results  Component Value Date   CHOL 121 11/01/2018   HDL 24 (L) 11/01/2018   LDLCALC 60 11/01/2018   TRIG 348 (H) 11/01/2018   CHOLHDL 5.0 (H) 11/01/2018    He has not been working on diet and exercise for  T2DM on metformin 1000 mg BID, glipizide 10 mg TID *** and cinnamon supplement, and denies hypoglycemia , nausea, paresthesia of the feet, polydipsia, polyuria, visual disturbances and vomiting. Last A1C in the office was:  Lab Results  Component Value Date   HGBA1C 7.9 (H) 11/01/2018    He has CKD IIIb monitored via this  office:  Lab Results  Component Value Date   GFRNONAA 43 (L) 11/01/2018   Patient is on Vitamin D supplement.   Lab Results  Component Value Date   VD25OH 43 11/01/2018        Current Medications:  Current Outpatient Medications on File Prior to Visit  Medication Sig  . albuterol (VENTOLIN HFA) 108 (90 Base) MCG/ACT inhaler   . aspirin 81 MG tablet Take 81 mg by mouth daily.  . Cholecalciferol (VITAMIN D3) 5000 units CAPS Take 2 capsules (10,000 Units total) by mouth daily. (Patient taking differently: Take 2 capsules by mouth daily. )  . Chromium-Cinnamon (CINNAMON PLUS CHROMIUM) [phone] MCG-MG CAPS Take 2 capsules by mouth daily.  . Cyanocobalamin (B-12 PO) Take by mouth daily.  . famotidine (PEPCID) 40 MG tablet Take 40 mg by mouth as needed.   . fenofibrate (TRICOR) 145 MG tablet Take 1 tablet Daily for Triglycerides (Blood Fats) (Patient taking differently: Take 145 mg by mouth daily. Take 1 tablet Daily for Triglycerides (Blood Fats))  . fexofenadine (ALLEGRA) 180 MG tablet Take 1 tablet Daily for Allergies (Patient taking differently: Take 180 mg by mouth daily as needed. Take 1 tablet Daily for Allergies)  . fluticasone (FLONASE) 50 MCG/ACT nasal spray Place 1 spray into both nostrils daily as needed.   Marland Kitchen glipiZIDE (GLUCOTROL) 10 MG tablet Take 1 tablet  3 x  /day with Meals for Diabetes  (Dx: E11.21)  . glucose blood test strip   . losartan (COZAAR) 100 MG tablet Take 1 tablet Daily for BP  . metFORMIN (GLUCOPHAGE-XR) 500 MG 24 hr tablet Take 2 tablets  2 x /day with Meals for Diabetes  . Multiple Vitamins-Minerals (MEGA MULTIVITAMIN FOR MEN) TABS Take 1 tablet by mouth daily.  Marland Kitchen PROAIR HFA 108 (90 Base) MCG/ACT inhaler   . rosuvastatin (CRESTOR) 5 MG tablet TAKE 1 TABLET BY MOUTH EVERY DAY  . tamsulosin (FLOMAX) 0.4 MG CAPS capsule Take 0.4 mg by mouth daily.   Marland Kitchen terbinafine (LAMISIL) 250 MG tablet Take 1 tablet (250 mg total) by mouth daily.  Marland Kitchen terbinafine (LAMISIL) 250  MG tablet Take 1 tablet (250 mg total) by mouth daily.   No current facility-administered medications on file prior to visit.      Allergies:  Allergies  Allergen Reactions  . Doxycycline     unknown  . Niacin And Related Other (See Comments)    flushing  . Sulfa Antibiotics Itching     Medical History:  Past Medical History:  Diagnosis Date  . Allergic rhinitis   . Bladder stones   . CKD (chronic kidney disease), stage III   . Common iliac aneurysm (HCC) right side   followed by vascular-- dr Trula Slade (note in epic)  . Environmental and seasonal allergies   . External carotid artery stenosis     per last duplex 05-18-2011  right moderate stenosis and left moderate to severe  . Hematuria   . History of adenomatous polyp of colon    tubular adenoma's  . History of bladder stone   . History of DVT in adulthood    08/ 2009 right lower extremity;  2013  left lower extremity  (10-11-2018 per pt no dvt since 2013)  . History of kidney stones   . History of pulmonary embolism    bilatera lung  08/ 2009  (10-11-2018  per pt no pe since 2009)  . History of wheezing    allergies--- prn inhaler  . Hypertension   . Hypogonadism male   . Internal carotid artery stenosis, bilateral     per last duplex 05-18-2011 in epic,   bilateral 40-59% stenosis  (10-11-2018  per pt denies stroke/tia S&S)  . Lower urinary tract symptoms (LUTS)   . Mixed hyperlipidemia   . Nephrolithiasis   . Testosterone deficiency   . Type 2 diabetes mellitus ([place])   . Vitamin D deficiency   . Wears glasses    Family history- Reviewed and unchanged Social history- Reviewed and unchanged   Review of Systems:  Review of Systems  Constitutional: Negative for malaise/fatigue and weight loss.  HENT: Negative for hearing loss and tinnitus.   Eyes: Negative for blurred vision and double vision.  Respiratory: Negative for cough, shortness of breath and wheezing.   Cardiovascular: Negative for chest pain,  palpitations, orthopnea, claudication and leg swelling.  Gastrointestinal: Negative for abdominal pain, blood in stool, constipation, diarrhea, heartburn, melena, nausea and vomiting.  Genitourinary: Negative.   Musculoskeletal: Negative for joint pain and myalgias.  Skin: Negative for rash.  Neurological: Negative for dizziness, tingling, sensory change, weakness and headaches.  Endo/Heme/Allergies: Negative for polydipsia.  Psychiatric/Behavioral: Negative.   All other systems reviewed and are negative.    Physical Exam: There were no vitals taken for this visit. Wt Readings from Last 3 Encounters:  11/01/18 213 lb 6.4 oz (96.8 kg)  10/15/18 218 lb (98.9 kg)  09/26/18 224 lb 6.4 oz (101.8 kg)   General Appearance: Well nourished, in no apparent distress. Eyes: PERRLA, EOMs, conjunctiva no swelling or erythema Sinuses: No Frontal/maxillary tenderness ENT/Mouth: Ext aud canals clear, TMs without erythema, bulging. No erythema, swelling, or exudate on post pharynx.  Tonsils not swollen or erythematous. Hearing normal.  Neck: Supple, thyroid normal.  Respiratory: Respiratory effort normal, BS equal bilaterally without rales, rhonchi, wheezing or stridor.  Cardio: RRR with no MRGs. Brisk peripheral pulses without edema.  Abdomen: Soft, + BS.  Non tender, no guarding, rebound, hernias, masses. Lymphatics: Non tender without lymphadenopathy.  Musculoskeletal: Full ROM, 5/5 strength, Normal gait Skin: Warm, dry without rashes, lesions, ecchymosis.  Neuro: Cranial nerves intact. No cerebellar symptoms.  Psych: Awake and oriented X 3, normal affect, Insight and Judgment appropriate.    Joshua Ribas, Joshua Peterson 8:39 AM [facility]

## 2019-02-05 ENCOUNTER — Ambulatory Visit: Payer: Medicare Other | Admitting: Adult Health

## 2019-02-13 NOTE — Progress Notes (Signed)
FOLLOW UP  Assessment and Plan:   Aneurysm of left iliac artery Control BP; followed annually by vascular Dr. Trula Slade  Hypertension Well controlled with current medications  Monitor blood pressure at home; patient to call if consistently greater than 130/80 Continue DASH diet.   Reminder to go to the ER if any CP, SOB, nausea, dizziness, severe HA, changes vision/speech, left arm numbness and tingling and jaw pain.  Hyperlipidemia associated with T2DM (HCC) Continue rosuvastatin 5 mg (LDL at goal <70), continue fish oil and fenofibrate for trigs, lifestyle for trigs reviewed Continue low cholesterol diet and exercise.  Check lipid panel.   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  Perform daily foot/skin check, notify office of any concerning changes.  Check A1C  CKD IIIb associated with T2DM (HCC) Increase fluids, avoid NSAIDS, monitor sugars, will monitor  Obesity with co morbidities Long discussion about weight loss, diet, and exercise Recommended diet heavy in fruits and veggies and low in animal meats, cheeses, and dairy products, appropriate calorie intake Discussed ideal weight for height and initial weight goal (200 lb) Patient will work on lower carb diet - suggested low GI, modified keto, information provided for eating out Continue with portions, reminded to limit processed carbs, doing nutrisystem with progress Will follow up in 3 months  Vitamin D Def Below goal at last visit; he has not changed dose continue supplementation to maintain goal of 60-100 Defer Vit D level  Continue diet and meds as discussed. Further disposition pending results of labs. Discussed med's effects and SE's.   Over 30 minutes of exam, counseling, chart review, and critical decision  making was performed.   Future Appointments  Date Time Provider [place]  05/13/2019 10:30 AM Unk Pinto, MD [facility] None  08/12/2019 10:00 AM Vicie Mutters, PA-C [facility] None  12/16/2019  3:00 PM Unk Pinto, MD [facility] None    ----------------------------------------------------------------------------------------------------------------------  HPI 78 y.o. male  presents for 3 month follow up on hypertension, cholesterol, diabetes, weight and vitamin D deficiency.   He is following with Dr. Milinda Pointer for toe nail fungus on lamisil therapy, taking 250 mg daily, has seen improvement.    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   He has hx of GERD but currently managing by lifestyle, has pepcid to take if needed but hasn't needed in 6-12 months.   BMI is Body mass index is 29.21 kg/m., he has been working on diet, has been on nutrisystem and losing weight gradually, doing portion control with containers, and small frequent meals. His goal is <200 lb. Weighs daily at home. He isn't exercising much due to his wife. He does admit to splurging too much with candy with recent holidays.  Wt Readings from Last 3 Encounters:  02/18/19 209 lb 6.4 oz (95 kg)  11/01/18 213 lb 6.4 oz (96.8 kg)  10/15/18 218 lb (98.9 kg)   He has right common iliac artery aneurysm that was found incidentally on a CT scan in 2018 for hematuria.  At that time, maximum diameter was 2 cm. He follows annually with Dr. Trula Slade.  His blood pressure has been controlled at home, today their BP is BP: 112/68  He does not workout. He denies chest  pain, shortness of breath, dizziness.   He is on cholesterol medication Rosuvastatin 5 mg daily and Fenofibrate 145 mg and fish oil and denies myalgias. His LDL cholesterol is at goal. The cholesterol last visit was:   Lab Results  Component Value Date    CHOL 121 11/01/2018   HDL 24 (L) 11/01/2018   LDLCALC 60 11/01/2018   TRIG 348 (H) 11/01/2018   CHOLHDL 5.0 (H) 11/01/2018    He has not been working on diet and exercise for T2DM on metformin 1000 mg BID, glipizide 10 mg TID (new) and cinnamon supplement, and denies hypoglycemia , nausea, paresthesia of the feet, polydipsia, polyuria, visual disturbances and vomiting. He does check fasting glucose (reports labile, has been 112-200+) and occasionally after meals. Last A1C in the office was:  Lab Results  Component Value Date   HGBA1C 7.9 (H) 11/01/2018    He has CKD IIIb monitored via this office:  Lab Results  Component Value Date   GFRNONAA 43 (L) 11/01/2018   Patient is on Vitamin D supplement, taking 10000 IU, dose has not change    Lab Results  Component Value Date   VD25OH 43 11/01/2018        Current Medications:  Current Outpatient Medications on File Prior to Visit  Medication Sig  . aspirin 81 MG tablet Take 81 mg by mouth daily.  . Cholecalciferol (VITAMIN D3) 5000 units CAPS Take 2 capsules (10,000 Units total) by mouth daily. (Patient taking differently: Take 2 capsules by mouth daily. )  . Chromium-Cinnamon (CINNAMON PLUS CHROMIUM) [phone] MCG-MG CAPS Take 2 capsules by mouth daily.  . Cyanocobalamin (B-12 PO) Take by mouth daily.  . fenofibrate (TRICOR) 145 MG tablet Take 1 tablet Daily for Triglycerides (Blood Fats) (Patient taking differently: Take 145 mg by mouth daily. Take 1 tablet Daily for Triglycerides (Blood Fats))  . fexofenadine (ALLEGRA) 180 MG tablet Take 1 tablet Daily for Allergies (Patient taking differently: Take 180 mg by mouth daily as needed. Take 1 tablet Daily for Allergies)  . fluticasone (FLONASE) 50 MCG/ACT nasal spray Place 1 spray into both nostrils daily as needed.   Marland Kitchen glipiZIDE (GLUCOTROL) 10 MG tablet Take 1 tablet  3 x  /day with Meals for Diabetes  (Dx: E11.21)  . glucose blood test strip   . losartan (COZAAR) 100 MG tablet Take 1  tablet Daily for BP  . metFORMIN (GLUCOPHAGE-XR) 500 MG 24 hr tablet Take 2 tablets  2 x /day with Meals for Diabetes  . Multiple Vitamins-Minerals (MEGA MULTIVITAMIN FOR MEN) TABS Take 1 tablet by mouth daily.  . rosuvastatin (CRESTOR) 5 MG tablet TAKE 1 TABLET BY MOUTH EVERY DAY  . tamsulosin (FLOMAX) 0.4 MG CAPS capsule Take 0.4 mg by mouth daily.   Marland Kitchen terbinafine (LAMISIL) 250 MG tablet Take 1 tablet (250 mg total) by mouth daily.  Marland Kitchen albuterol (VENTOLIN HFA) 108 (90 Base) MCG/ACT inhaler   . famotidine (PEPCID) 40 MG tablet Take 40 mg by mouth as needed.   Marland Kitchen PROAIR HFA 108 (90 Base) MCG/ACT inhaler   . terbinafine (LAMISIL) 250 MG tablet Take 1 tablet (250 mg total) by mouth daily. (Patient not taking: Reported on 02/18/2019)   No current facility-administered medications on file prior to visit.      Allergies:  Allergies  Allergen Reactions  . Doxycycline     unknown  . Niacin And Related Other (See Comments)    flushing  . Sulfa Antibiotics Itching     Medical History:  Past Medical History:  Diagnosis Date  . Allergic rhinitis   . Bladder stones   . CKD (chronic kidney disease), stage III   . Common iliac aneurysm (HCC) right side   followed by vascular-- dr Trula Slade (note in epic)  . Environmental and seasonal allergies   . External carotid artery stenosis     per last duplex 05-18-2011  right moderate stenosis and left moderate to severe  . Hematuria   . History of adenomatous polyp of colon    tubular adenoma's  . History of bladder stone   . History of DVT in adulthood    08/ 2009 right lower extremity;  2013  left lower extremity  (10-11-2018 per pt no dvt since 2013)  . History of kidney stones   . History of pulmonary embolism    bilatera lung  08/ 2009  (10-11-2018  per pt no pe since 2009)  . History of wheezing    allergies--- prn inhaler  . Hypertension   . Hypogonadism male   . Internal carotid artery stenosis, bilateral     per last duplex  05-18-2011 in epic,   bilateral 40-59% stenosis  (10-11-2018  per pt denies stroke/tia S&S)  . Lower urinary tract symptoms (LUTS)   . Mixed hyperlipidemia   . Nephrolithiasis   . Testosterone deficiency   . Type 2 diabetes mellitus ([place])   . Vitamin D deficiency   . Wears glasses    Family history- Reviewed and unchanged Social history- Reviewed and unchanged   Review of Systems:  Review of Systems  Constitutional: Negative for malaise/fatigue and weight loss.  HENT: Negative for hearing loss and tinnitus.   Eyes: Negative for blurred vision and double vision.  Respiratory: Negative for cough, shortness of breath and wheezing.   Cardiovascular: Negative for chest pain, palpitations, orthopnea, claudication and leg swelling.  Gastrointestinal: Negative for abdominal pain, blood in stool, constipation, diarrhea, heartburn, melena, nausea and vomiting.  Genitourinary: Negative.   Musculoskeletal: Negative for joint pain and myalgias.  Skin: Negative for rash.  Neurological: Negative for dizziness, tingling, sensory change, weakness and headaches.  Endo/Heme/Allergies: Negative for polydipsia.  Psychiatric/Behavioral: Negative.   All other systems reviewed and are negative.    Physical Exam: BP 112/68   Pulse 92   Temp (!) 97.3 F (36.3 C)   Ht 5\' 11"  (1.803 m)   Wt 209 lb 6.4 oz (95 kg)   SpO2 98%   BMI 29.21 kg/m  Wt Readings from Last 3 Encounters:  02/18/19 209 lb 6.4 oz (95 kg)  11/01/18 213 lb 6.4 oz (96.8 kg)  10/15/18 218 lb (98.9 kg)   General Appearance: Well nourished, in no apparent distress. Eyes: PERRLA, EOMs, conjunctiva no swelling or erythema Sinuses: No Frontal/maxillary tenderness ENT/Mouth: Ext aud canals clear, TMs without erythema, bulging. No erythema, swelling, or exudate on post pharynx.  Tonsils not swollen or erythematous. Hearing normal.  Neck: Supple, thyroid normal.  Respiratory: Respiratory effort normal, BS equal bilaterally without  rales, rhonchi, wheezing or stridor.  Cardio: RRR with no MRGs. Brisk peripheral pulses without edema.  Abdomen: Soft, + BS.  Non tender, no guarding, rebound, hernias, masses. Lymphatics: Non tender without lymphadenopathy.  Musculoskeletal: Full ROM, 5/5 strength, Normal gait Skin: Warm, dry without rashes, lesions, ecchymosis. L foot 1st-3rd toe nails distally 1/2-1/3 yellow/thick; normal/pink at proximally Neuro: Cranial nerves intact. No cerebellar symptoms.  Psych: Awake and oriented X 3, normal affect, Insight and Judgment appropriate.    Izora Ribas,  NP 11:30 AM [facility]

## 2019-02-18 ENCOUNTER — Ambulatory Visit (INDEPENDENT_AMBULATORY_CARE_PROVIDER_SITE_OTHER): Payer: Medicare Other | Admitting: Adult Health

## 2019-02-18 ENCOUNTER — Other Ambulatory Visit: Payer: Self-pay

## 2019-02-18 ENCOUNTER — Encounter: Payer: Self-pay | Admitting: Adult Health

## 2019-02-18 VITALS — BP 112/68 | HR 92 | Temp 97.3°F | Ht 71.0 in | Wt 209.4 lb

## 2019-02-18 DIAGNOSIS — E1122 Type 2 diabetes mellitus with diabetic chronic kidney disease: Secondary | ICD-10-CM

## 2019-02-18 DIAGNOSIS — E785 Hyperlipidemia, unspecified: Secondary | ICD-10-CM

## 2019-02-18 DIAGNOSIS — R0989 Other specified symptoms and signs involving the circulatory and respiratory systems: Secondary | ICD-10-CM | POA: Diagnosis not present

## 2019-02-18 DIAGNOSIS — Z79899 Other long term (current) drug therapy: Secondary | ICD-10-CM

## 2019-02-18 DIAGNOSIS — E1169 Type 2 diabetes mellitus with other specified complication: Secondary | ICD-10-CM | POA: Diagnosis not present

## 2019-02-18 DIAGNOSIS — N183 Chronic kidney disease, stage 3 unspecified: Secondary | ICD-10-CM

## 2019-02-18 DIAGNOSIS — I723 Aneurysm of iliac artery: Secondary | ICD-10-CM | POA: Diagnosis not present

## 2019-02-18 DIAGNOSIS — E66811 Obesity, class 1: Secondary | ICD-10-CM

## 2019-02-18 DIAGNOSIS — E1121 Type 2 diabetes mellitus with diabetic nephropathy: Secondary | ICD-10-CM

## 2019-02-18 DIAGNOSIS — E669 Obesity, unspecified: Secondary | ICD-10-CM

## 2019-02-18 DIAGNOSIS — N1832 Chronic kidney disease, stage 3b: Secondary | ICD-10-CM

## 2019-02-18 DIAGNOSIS — E559 Vitamin D deficiency, unspecified: Secondary | ICD-10-CM

## 2019-02-18 NOTE — Patient Instructions (Addendum)
Goals    . Exercise 150 min/wk Moderate Activity    . Fasting Blood Glucose <130    . Weight (lb) < 200 lb (90.7 kg)       Ideal fasting glucose <130    Exercise helps with insulin sensitivity - 15-20 min after a meal will help cells be more sensitive to insulin and bring down - very similar to what glipizide does  Ideally need to cut OUT bad carbs except for occasional splurges - focus on modest portions of good carbs, - mostly high fiber low starch veggies (leafy greens, cruciferous, peppers, cucumbers, onions, garlic), lean protein, healthy fat   Particularly want to avoid sugar, high fructose corn syrup (in a lot of processed foods) and white flour       Bad carbs also include fruit juice, alcohol, and sweet tea. These are empty calories that do not signal to your brain that you are full.   Please remember the good carbs are still carbs which convert into sugar. So please measure them out no more than 1/2-1 cup of rice, oatmeal, pasta, and beans  Veggies are however free foods! Pile them on.   Not all fruit is created equal. Please see the list below, the fruit at the bottom is higher in sugars than the fruit at the top. Please avoid all dried fruits.        Glipizide tablets What is this medicine? GLIPIZIDE (GLIP i zide) helps to treat type 2 diabetes. Treatment is combined with diet and exercise. The medicine helps your body to use insulin better. This medicine may be used for other purposes; ask your health care provider or pharmacist if you have questions. COMMON BRAND NAME(S): Glucotrol What should I tell my health care provider before I take this medicine? They need to know if you have any of these conditions:  diabetic ketoacidosis  glucose-6-phosphate dehydrogenase deficiency  heart disease  kidney disease  liver disease  porphyria  severe infection or injury  thyroid disease  an unusual or allergic reaction to glipizide, sulfa drugs, other  medicines, foods, dyes, or preservatives  pregnant or trying to get pregnant  breast-feeding How should I use this medicine? Take this medicine by mouth. Swallow with a drink of water. Do not take with food. Take it 30 minutes before a meal. Follow the directions on the prescription label. If you take this medicine once a day, take it 30 minutes before breakfast. Take your doses at the same time each day. Do not take more often than directed. Talk to your pediatrician regarding the use of this medicine in children. Special care may be needed. Elderly patients over 30 years old may have a stronger reaction and need a smaller dose. Overdosage: If you think you have taken too much of this medicine contact a poison control center or emergency room at once. NOTE: This medicine is only for you. Do not share this medicine with others. What if I miss a dose? If you miss a dose, take it as soon as you can. If it is almost time for your next dose, take only that dose. Do not take double or extra doses. What may interact with this medicine?  bosentan  chloramphenicol  cisapride  clarithromycin  medicines for fungal or yeast infections  metoclopramide  probenecid  warfarin Many medications may cause an increase or decrease in blood sugar, these include:  alcohol containing beverages  aspirin and aspirin-like drugs  chloramphenicol  chromium  diuretics  male  hormones, like estrogens or progestins and birth control pills  heart medicines  isoniazid  male hormones or anabolic steroids  medicines for weight loss  medicines for allergies, asthma, cold, or cough  medicines for mental problems  medicines called MAO Inhibitors like Nardil, Parnate, Marplan, Eldepryl  niacin  NSAIDs, medicines for pain and inflammation, like ibuprofen or naproxen  pentamidine  phenytoin  probenecid  quinolone antibiotics like ciprofloxacin, levofloxacin, ofloxacin  some herbal  dietary supplements  steroid medicines like prednisone or cortisone  thyroid medicine This list may not describe all possible interactions. Give your health care provider a list of all the medicines, herbs, non-prescription drugs, or dietary supplements you use. Also tell them if you smoke, drink alcohol, or use illegal drugs. Some items may interact with your medicine. What should I watch for while using this medicine? Visit your doctor or health care professional for regular checks on your progress. A test called the HbA1C (A1C) will be monitored. This is a simple blood test. It measures your blood sugar control over the last 2 to 3 months. You will receive this test every 3 to 6 months. Learn how to check your blood sugar. Learn the symptoms of low and high blood sugar and how to manage them. Always carry a quick-source of sugar with you in case you have symptoms of low blood sugar. Examples include hard sugar candy or glucose tablets. Make sure others know that you can choke if you eat or drink when you develop serious symptoms of low blood sugar, such as seizures or unconsciousness. They must get medical help at once. Tell your doctor or health care professional if you have high blood sugar. You might need to change the dose of your medicine. If you are sick or exercising more than usual, you might need to change the dose of your medicine. Do not skip meals. Ask your doctor or health care professional if you should avoid alcohol. Many nonprescription cough and cold products contain sugar or alcohol. These can affect blood sugar. This medicine can make you more sensitive to the sun. Keep out of the sun. If you cannot avoid being in the sun, wear protective clothing and use sunscreen. Do not use sun lamps or tanning beds/booths. Wear a medical ID bracelet or chain, and carry a card that describes your disease and details of your medicine and dosage times. What side effects may I notice from  receiving this medicine? Side effects that you should report to your doctor or health care professional as soon as possible:  allergic reactions like skin rash, itching or hives, swelling of the face, lips, or tongue  breathing problems  dark urine  fever, chills, sore throat  signs and symptoms of low blood sugar such as feeling anxious, confusion, dizziness, increased hunger, unusually weak or tired, sweating, shakiness, cold, irritable, headache, blurred vision, fast heartbeat, loss of consciousness  unusual bleeding or bruising  yellowing of the eyes or skin Side effects that usually do not require medical attention (report to your doctor or health care professional if they continue or are bothersome):  diarrhea  dizziness  headache  heartburn  nausea  stomach gas This list may not describe all possible side effects. Call your doctor for medical advice about side effects. You may report side effects to FDA at 1-800-FDA-1088. Where should I keep my medicine? Keep out of the reach of children. Store at room temperature below 30 degrees C (86 degrees F). Throw away any unused medicine  after the expiration date. NOTE: This sheet is a summary. It may not cover all possible information. If you have questions about this medicine, talk to your doctor, pharmacist, or health care provider.  2020 [employer]/Gold Standard (2012-06-20 14:42:46)

## 2019-02-19 ENCOUNTER — Other Ambulatory Visit: Payer: Self-pay | Admitting: Adult Health

## 2019-02-19 LAB — COMPLETE METABOLIC PANEL WITH GFR
AG Ratio: 1.7 (calc) (ref 1.0–2.5)
ALT: 15 U/L (ref 9–46)
AST: 14 U/L (ref 10–35)
Albumin: 4.5 g/dL (ref 3.6–5.1)
Alkaline phosphatase (APISO): 45 U/L (ref 35–144)
BUN/Creatinine Ratio: 19 (calc) (ref 6–22)
BUN: 29 mg/dL — ABNORMAL HIGH (ref 7–25)
CO2: 21 mmol/L (ref 20–32)
Calcium: 10 mg/dL (ref 8.6–10.3)
Chloride: 104 mmol/L (ref 98–110)
Creat: 1.52 mg/dL — ABNORMAL HIGH (ref 0.70–1.18)
GFR, Est African American: 50 mL/min/{1.73_m2} — ABNORMAL LOW (ref >=60)
GFR, Est Non African American: 43 mL/min/{1.73_m2} — ABNORMAL LOW (ref >=60)
Globulin: 2.7 g/dL (calc) (ref 1.9–3.7)
Glucose, Bld: 232 mg/dL — ABNORMAL HIGH (ref 65–99)
Potassium: 4.6 mmol/L (ref 3.5–5.3)
Sodium: 137 mmol/L (ref 135–146)
Total Bilirubin: 0.7 mg/dL (ref 0.2–1.2)
Total Protein: 7.2 g/dL (ref 6.1–8.1)

## 2019-02-19 LAB — LIPID PANEL
Cholesterol: 156 mg/dL (ref <200)
HDL: 29 mg/dL — ABNORMAL LOW (ref >=40)
LDL Cholesterol (Calc): 89 mg/dL (calc)
Non-HDL Cholesterol (Calc): 127 mg/dL (calc) (ref <130)
Total CHOL/HDL Ratio: 5.4 (calc) — ABNORMAL HIGH (ref <5.0)
Triglycerides: 290 mg/dL — ABNORMAL HIGH (ref <150)

## 2019-02-19 LAB — CBC WITH DIFFERENTIAL/PLATELET
Absolute Monocytes: 339 cells/uL (ref 200–950)
Basophils Absolute: 51 cells/uL (ref 0–200)
Basophils Relative: 0.8 %
Eosinophils Absolute: 141 cells/uL (ref 15–500)
Eosinophils Relative: 2.2 %
HCT: 39.2 % (ref 38.5–50.0)
Hemoglobin: 13.4 g/dL (ref 13.2–17.1)
Lymphs Abs: 1626 cells/uL (ref 850–3900)
MCH: 30.8 pg (ref 27.0–33.0)
MCHC: 34.2 g/dL (ref 32.0–36.0)
MCV: 90.1 fL (ref 80.0–100.0)
MPV: 9.6 fL (ref 7.5–12.5)
Monocytes Relative: 5.3 %
Neutro Abs: 4243 cells/uL (ref 1500–7800)
Neutrophils Relative %: 66.3 %
Platelets: 156 10*3/uL (ref 140–400)
RBC: 4.35 10*6/uL (ref 4.20–5.80)
RDW: 13.2 % (ref 11.0–15.0)
Total Lymphocyte: 25.4 %
WBC: 6.4 10*3/uL (ref 3.8–10.8)

## 2019-02-19 LAB — HEMOGLOBIN A1C
Hgb A1c MFr Bld: 8.4 % of total Hgb — ABNORMAL HIGH (ref <5.7)
Mean Plasma Glucose: 194 (calc)
eAG (mmol/L): 10.8 (calc)

## 2019-02-19 LAB — TSH: TSH: 2.16 mIU/L (ref 0.40–4.50)

## 2019-02-19 LAB — MAGNESIUM: Magnesium: 1.9 mg/dL (ref 1.5–2.5)

## 2019-02-19 MED ORDER — ROSUVASTATIN CALCIUM 10 MG PO TABS: 10.0000 mg | ORAL_TABLET | Freq: Every day | ORAL | 1 refills | Status: DC

## 2019-03-12 ENCOUNTER — Other Ambulatory Visit: Payer: Self-pay | Admitting: Adult Health

## 2019-05-12 ENCOUNTER — Encounter: Payer: Self-pay | Admitting: Internal Medicine

## 2019-05-12 MED ORDER — POTASSIUM CITRATE ER 15 MEQ (1620 MG) PO TBCR: EXTENDED_RELEASE_TABLET | ORAL | 3 refills | Status: DC

## 2019-05-12 NOTE — Progress Notes (Signed)
History of Present Illness:       This very nice 79 y.o. MWM presents for 6 month follow up with HTN, HLD, T2_NIDDM and Vitamin D Deficiency.       Patient is treated for HTN (2004)  BP has been controlled at home. Today's BP is at goal - 114/72. Patient has had no complaints of any cardiac type chest pain, palpitations, dyspnea / orthopnea / PND, dizziness, claudication, or dependent edema.      Hyperlipidemia is not controlled with diet & meds with elevated Trig's. Patient denies myalgias or other med SE's. 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   Patient is on only Glipizide as he apparently misunderstood to continue Metformin when he started Glipizide. Patient is not Diet compliant and consequently last A1c was not at goal:  Lab Results  Component Value Date   HGBA1C 8.4 (H) 02/18/2019       Further, the patient also has history of Vitamin D Deficiency ("24" / 2008)  and supplements vitamin D without any suspected side-effects. Last vitamin D was still low:  Lab Results  Component Value Date   VD25OH 46 11/01/2018    Current Outpatient Medications on File Prior to Visit  Medication Sig  . albuterol (VENTOLIN HFA) 108 (90 Base) MCG/ACT inhaler   . aspirin 81 MG tablet Take 81 mg by mouth daily.  . Cholecalciferol (VITAMIN D3) 5000 units CAPS Take 2 capsules (10,000 Units total) by mouth daily. (Patient taking differently: Take 2 capsules by mouth daily. )  . Chromium-Cinnamon (CINNAMON PLUS CHROMIUM) [phone] MCG-MG CAPS Take 2 capsules by mouth daily.  . famotidine (PEPCID) 40 MG tablet Take 40 mg by mouth as needed.   . fenofibrate  (TRICOR) 145 MG tablet Take 1 tablet Daily for Triglycerides (Blood Fats) (Patient taking differently: Take 145 mg by mouth daily. Take 1 tablet Daily for Triglycerides (Blood Fats))  . fexofenadine (ALLEGRA) 180 MG tablet Take 1 tablet Daily for Allergies (Patient taking differently: Take 180 mg by mouth daily as needed. Take 1 tablet Daily for Allergies)  . fluticasone (FLONASE) 50 MCG/ACT nasal spray Place 1 spray into both nostrils daily as needed.   Marland Kitchen glipiZIDE (GLUCOTROL) 10 MG tablet Take 1 tablet  3 x  /day with Meals for Diabetes  (Dx: E11.21)  . losartan (COZAAR) 100 MG tablet Take 1 tablet Daily for BP  . Multiple Vitamins-Minerals (MEGA MULTIVITAMIN FOR MEN) TABS Take 1 tablet by mouth daily.  Glory Rosebush ULTRA test strip USE TO CHECK BLOOD SUGAR 1 TIME DAILY-DX-E11.22  . PROAIR HFA 108 (90 Base) MCG/ACT inhaler   . rosuvastatin (CRESTOR) 10 MG tablet Take 1 tablet (10 mg total) by mouth daily.  . metFORMIN (GLUCOPHAGE-XR) 500 MG 24 hr tablet Take 2 tablets  2 x /day with Meals for Diabetes (Patient not taking: Reported on 05/13/2019)   No current facility-administered medications on file prior to visit.    Allergies  Allergen Reactions  . Doxycycline     unknown  . Niacin And Related Other (See Comments)    flushing  . Sulfa Antibiotics Itching  PMHx:   Past Medical History:  Diagnosis Date  . Allergic rhinitis   . Bladder stones   . CKD (chronic kidney disease), stage III   . Common iliac aneurysm (HCC) right side   followed by vascular-- dr Trula Slade (note in epic)  . Environmental and seasonal allergies   . External carotid artery stenosis     per last duplex 05-18-2011  right moderate stenosis and left moderate to severe  . Hematuria   . History of adenomatous polyp of colon    tubular adenoma's  . History of bladder stone   . History of DVT in adulthood    08/ 2009 right lower extremity;  2013  left lower extremity  (10-11-2018 per pt no dvt since 2013)  .  History of kidney stones   . History of pulmonary embolism    bilatera lung  08/ 2009  (10-11-2018  per pt no pe since 2009)  . History of wheezing    allergies--- prn inhaler  . Hypertension   . Hypogonadism male   . Internal carotid artery stenosis, bilateral     per last duplex 05-18-2011 in epic,   bilateral 40-59% stenosis  (10-11-2018  per pt denies stroke/tia S&S)  . Lower urinary tract symptoms (LUTS)   . Mixed hyperlipidemia   . Nephrolithiasis   . Testosterone deficiency   . Type 2 diabetes mellitus ([place])   . Vitamin D deficiency   . Wears glasses     Immunization History  Administered Date(s) Administered  . Fluad Quad(high Dose 65+) 11/23/2018  . Influenza, High Dose Seasonal PF 01/14/2015, 11/26/2015, 12/29/2016, 12/18/2017  . Moderna SARS-COVID-2 Vaccination 04/04/2019, 05/02/2019  . Pneumococcal Conjugate-13 01/14/2015  . Pneumococcal-Unspecified 04/02/2008  . Td 04/02/2006, 12/29/2016  . Zoster 04/03/2007    Past Surgical History:  Procedure Laterality Date  . COLONOSCOPY  last one 10-25-2017  . CYSTOSCOPY WITH LITHOLAPAXY N/A 07/18/2016   Procedure: CYSTOSCOPY WITH LITHOLAPAXY;  Surgeon: Kathie Rhodes, MD;  Location: [facility];  Service: Urology;  Laterality: N/A;  . CYSTOSCOPY WITH LITHOLAPAXY N/A 10/15/2018   Procedure: CYSTOSCOPY WITH LITHOLAPAXY;  Surgeon: Kathie Rhodes, MD;  Location: [facility];  Service: Urology;  Laterality: N/A;  . IVC FILTER INSERTION  11/21/2007  . IVC FILTER REMOVAL  01/31/2008  . 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 RIGHT CHEEK/MOUTH BIOPSY  09/16/2008  (office)   squamous hyperplasia w/ hyperkeratosis  . RIGHT DEEP CERVICAL NODE BIOPSY  01/03/2001   right posterior neck (per path report-- lymphoid hyperplasia w/ florid follicular hyperplasia, no malignancy(  . TONSILLECTOMY  age 65    FHx:    Reviewed / unchanged  SHx:    Reviewed / unchanged   Systems  Review:  Constitutional: Denies fever, chills, wt changes, headaches, insomnia, fatigue, night sweats, change in appetite. Eyes: Denies redness, blurred vision, diplopia, discharge, itchy, watery eyes.  ENT: Denies discharge, congestion, post nasal drip, epistaxis, sore throat, earache, hearing loss, dental pain, tinnitus, vertigo, sinus pain, snoring.  CV: Denies chest pain, palpitations, irregular heartbeat, syncope, dyspnea, diaphoresis, orthopnea, PND, claudication or edema. Respiratory: denies cough, dyspnea, DOE, pleurisy, hoarseness, laryngitis, wheezing.  Gastrointestinal: Denies dysphagia, odynophagia, heartburn, reflux, water brash, abdominal pain or cramps, nausea, vomiting, bloating, diarrhea, constipation, hematemesis, melena, hematochezia  or hemorrhoids. Genitourinary: Denies dysuria, frequency, urgency, nocturia, hesitancy, discharge, hematuria or flank pain. Musculoskeletal: Denies arthralgias, myalgias, stiffness, jt. swelling, pain, limping or strain/sprain.  Skin: Denies pruritus, rash, hives, warts, acne, eczema or  change in skin lesion(s). Neuro: No weakness, tremor, incoordination, spasms, paresthesia or pain. Psychiatric: Denies confusion, memory loss or sensory loss. Endo: Denies change in weight, skin or hair change.  Heme/Lymph: No excessive bleeding, bruising or enlarged lymph nodes.  Physical Exam  BP 114/72   Pulse 76   Temp (!) 97.2 F (36.2 C)   Resp 16   Ht 5\' 11"  (1.803 m)   Wt 209 lb 12.8 oz (95.2 kg)   BMI 29.26 kg/m   Appears  well nourished, well groomed  and in no distress.  Eyes: PERRLA, EOMs, conjunctiva no swelling or erythema. Sinuses: No frontal/maxillary tenderness ENT/Mouth: EAC's clear, TM's nl w/o erythema, bulging. Nares clear w/o erythema, swelling, exudates. Oropharynx clear without erythema or exudates. Oral hygiene is good. Tongue normal, non obstructing. Hearing intact.  Neck: Supple. Thyroid not palpable. Car 2+/2+ without  bruits, nodes or JVD. Chest: Respirations nl with BS clear & equal w/o rales, rhonchi, wheezing or stridor.  Cor: Heart sounds normal w/ regular rate and rhythm without sig. murmurs, gallops, clicks or rubs. Peripheral pulses normal and equal  without edema.  Abdomen: Soft & bowel sounds normal. Non-tender w/o guarding, rebound, hernias, masses or organomegaly.  Lymphatics: Unremarkable.  Musculoskeletal: Full ROM all peripheral extremities, joint stability, 5/5 strength and normal gait.  Skin: Warm, dry without exposed rashes, lesions or ecchymosis apparent.  Neuro: Cranial nerves intact, reflexes equal bilaterally. Sensory-motor testing grossly intact. Tendon reflexes grossly intact.  Pysch: Alert & oriented x 3.  Insight and judgement nl & appropriate. No ideations.  Assessment and Plan:  1. Essential hypertension  - Continue medication, monitor blood pressure at home.  - Continue DASH diet.  Reminder to go to the ER if any CP,  SOB, nausea, dizziness, severe HA, changes vision/speech.  - CBC with Differential/Platelet - COMPLETE METABOLIC PANEL WITH GFR - Magnesium - TSH  2. Hyperlipidemia associated with type 2 diabetes mellitus ([place])  - Continue diet/meds, exercise,& lifestyle modifications.  - Continue monitor periodic cholesterol/liver & renal functions   - Lipid panel - TSH  3. Type 2 diabetes mellitus with stage 3b chronic kidney disease,  without long-term current use of insulin (HCC)  - Continue diet, exercise  - Lifestyle modifications.  - Monitor appropriate labs  - Hemoglobin A1c - Insulin, random  4. Vitamin D deficiency  - Continue supplementation.  - VITAMIN D 25 Hydroxy  5. Medication management  - CBC with Differential/Platelet - COMPLETE METABOLIC PANEL WITH GFR - Magnesium - Lipid panel - TSH - Hemoglobin A1c - Insulin, random - VITAMIN D 25 Hydroxy        Discussed  regular exercise, BP monitoring, weight control to achieve/maintain BMI  less than 25 and discussed med and SE's. Recommended labs to assess and monitor clinical status with further disposition pending results of labs.  I discussed the assessment and treatment plan with the patient. The patient was provided an opportunity to ask questions and all were answered. The patient agreed with the plan and demonstrated an understanding of the instructions.  I provided over 30 minutes of exam, counseling, chart review and  complex critical decision making.  Kirtland Bouchard, MD

## 2019-05-12 NOTE — Patient Instructions (Signed)

## 2019-05-13 ENCOUNTER — Ambulatory Visit: Payer: Medicare Other | Admitting: Internal Medicine

## 2019-05-13 ENCOUNTER — Other Ambulatory Visit: Payer: Self-pay

## 2019-05-13 VITALS — BP 114/72 | HR 76 | Temp 97.2°F | Resp 16 | Ht 71.0 in | Wt 209.8 lb

## 2019-05-13 DIAGNOSIS — E1169 Type 2 diabetes mellitus with other specified complication: Secondary | ICD-10-CM | POA: Diagnosis not present

## 2019-05-13 DIAGNOSIS — Z79899 Other long term (current) drug therapy: Secondary | ICD-10-CM

## 2019-05-13 DIAGNOSIS — E559 Vitamin D deficiency, unspecified: Secondary | ICD-10-CM

## 2019-05-13 DIAGNOSIS — I10 Essential (primary) hypertension: Secondary | ICD-10-CM

## 2019-05-13 DIAGNOSIS — N1832 Chronic kidney disease, stage 3b: Secondary | ICD-10-CM

## 2019-05-13 DIAGNOSIS — E1121 Type 2 diabetes mellitus with diabetic nephropathy: Secondary | ICD-10-CM

## 2019-05-13 DIAGNOSIS — E785 Hyperlipidemia, unspecified: Secondary | ICD-10-CM

## 2019-05-14 LAB — LIPID PANEL
Cholesterol: 154 mg/dL (ref <200)
HDL: 28 mg/dL — ABNORMAL LOW (ref >=40)
LDL Cholesterol (Calc): 89 mg/dL (calc)
Non-HDL Cholesterol (Calc): 126 mg/dL (calc) (ref <130)
Total CHOL/HDL Ratio: 5.5 (calc) — ABNORMAL HIGH (ref <5.0)
Triglycerides: 305 mg/dL — ABNORMAL HIGH (ref <150)

## 2019-05-14 LAB — CBC WITH DIFFERENTIAL/PLATELET
Absolute Monocytes: 251 cells/uL (ref 200–950)
Basophils Absolute: 29 cells/uL (ref 0–200)
Basophils Relative: 0.5 %
Eosinophils Absolute: 97 cells/uL (ref 15–500)
Eosinophils Relative: 1.7 %
HCT: 38.9 % (ref 38.5–50.0)
Hemoglobin: 13.2 g/dL (ref 13.2–17.1)
Lymphs Abs: 1414 cells/uL (ref 850–3900)
MCH: 31.3 pg (ref 27.0–33.0)
MCHC: 33.9 g/dL (ref 32.0–36.0)
MCV: 92.2 fL (ref 80.0–100.0)
MPV: 9.4 fL (ref 7.5–12.5)
Monocytes Relative: 4.4 %
Neutro Abs: 3910 cells/uL (ref 1500–7800)
Neutrophils Relative %: 68.6 %
Platelets: 138 10*3/uL — ABNORMAL LOW (ref 140–400)
RBC: 4.22 10*6/uL (ref 4.20–5.80)
RDW: 12.8 % (ref 11.0–15.0)
Total Lymphocyte: 24.8 %
WBC: 5.7 10*3/uL (ref 3.8–10.8)

## 2019-05-14 LAB — COMPLETE METABOLIC PANEL WITH GFR
AG Ratio: 1.8 (calc) (ref 1.0–2.5)
ALT: 13 U/L (ref 9–46)
AST: 9 U/L — ABNORMAL LOW (ref 10–35)
Albumin: 4.3 g/dL (ref 3.6–5.1)
Alkaline phosphatase (APISO): 51 U/L (ref 35–144)
BUN/Creatinine Ratio: 18 (calc) (ref 6–22)
BUN: 28 mg/dL — ABNORMAL HIGH (ref 7–25)
CO2: 23 mmol/L (ref 20–32)
Calcium: 9.6 mg/dL (ref 8.6–10.3)
Chloride: 108 mmol/L (ref 98–110)
Creat: 1.6 mg/dL — ABNORMAL HIGH (ref 0.70–1.18)
GFR, Est African American: 47 mL/min/{1.73_m2} — ABNORMAL LOW (ref >=60)
GFR, Est Non African American: 41 mL/min/{1.73_m2} — ABNORMAL LOW (ref >=60)
Globulin: 2.4 g/dL (calc) (ref 1.9–3.7)
Glucose, Bld: 257 mg/dL — ABNORMAL HIGH (ref 65–99)
Potassium: 4.6 mmol/L (ref 3.5–5.3)
Sodium: 140 mmol/L (ref 135–146)
Total Bilirubin: 0.7 mg/dL (ref 0.2–1.2)
Total Protein: 6.7 g/dL (ref 6.1–8.1)

## 2019-05-14 LAB — VITAMIN D 25 HYDROXY (VIT D DEFICIENCY, FRACTURES): Vit D, 25-Hydroxy: 38 ng/mL (ref 30–100)

## 2019-05-14 LAB — TSH: TSH: 1.54 mIU/L (ref 0.40–4.50)

## 2019-05-14 LAB — HEMOGLOBIN A1C
Hgb A1c MFr Bld: 9.4 % of total Hgb — ABNORMAL HIGH (ref <5.7)
Mean Plasma Glucose: 223 (calc)
eAG (mmol/L): 12.4 (calc)

## 2019-05-14 LAB — MAGNESIUM: Magnesium: 1.9 mg/dL (ref 1.5–2.5)

## 2019-05-14 LAB — INSULIN, RANDOM: Insulin: 22.3 u[IU]/mL — ABNORMAL HIGH

## 2019-05-16 ENCOUNTER — Other Ambulatory Visit: Payer: Self-pay | Admitting: Adult Health

## 2019-05-16 DIAGNOSIS — F329 Major depressive disorder, single episode, unspecified: Secondary | ICD-10-CM

## 2019-05-16 DIAGNOSIS — F32A Depression, unspecified: Secondary | ICD-10-CM

## 2019-05-24 ENCOUNTER — Encounter: Payer: Self-pay | Admitting: Internal Medicine

## 2019-06-05 ENCOUNTER — Encounter: Payer: Self-pay | Admitting: Internal Medicine

## 2019-06-27 LAB — HM DIABETES EYE EXAM

## 2019-06-28 ENCOUNTER — Encounter: Payer: Self-pay | Admitting: *Deleted

## 2019-07-06 ENCOUNTER — Other Ambulatory Visit: Payer: Self-pay | Admitting: Adult Health

## 2019-07-16 NOTE — Progress Notes (Signed)
History of Present Illness:     Patient is a very nice 79 yo MWM with HTN, HLD, T2_NIDDM and Vitamin D Deficiency presenting with concern of bilat chest pains w/o hx/o injury or association with activity or exertion. Denies cough or shortness of breath.   Medications  Current Outpatient Medications (Endocrine & Metabolic):  .  glipiZIDE (GLUCOTROL) 10 MG tablet, Take 1 tablet  3 x  /day with Meals for Diabetes  (Dx: E11.21) .  metFORMIN (GLUCOPHAGE-XR) 500 MG 24 hr tablet, Take 2 tablets  2 x /day with Meals for Diabetes .  dexamethasone (DECADRON) 4 MG tablet, Take 1 tab 3 x day - 3 days, then 2 x day - 3 days, then 1 tab daily  Current Outpatient Medications (Cardiovascular):  .  fenofibrate (TRICOR) 145 MG tablet, Take 1 tablet Daily for Triglycerides (Blood Fats) (Patient taking differently: Take 145 mg by mouth daily. Take 1 tablet Daily for Triglycerides (Blood Fats)) .  losartan (COZAAR) 100 MG tablet, Take 1 tablet Daily for BP .  rosuvastatin (CRESTOR) 5 MG tablet, Take 1 tablet Daily for Cholesterol  Current Outpatient Medications (Respiratory):  .  albuterol (VENTOLIN HFA) 108 (90 Base) MCG/ACT inhaler,  .  fluticasone (FLONASE) 50 MCG/ACT nasal spray, Place 1 spray into both nostrils daily as needed.  Marland Kitchen  PROAIR HFA 108 (90 Base) MCG/ACT inhaler,   Current Outpatient Medications (Analgesics):  .  aspirin 81 MG tablet, Take 81 mg by mouth daily.   Current Outpatient Medications (Other):  Marland Kitchen  Cholecalciferol (VITAMIN D3) 5000 units CAPS, Take 2 capsules (10,000 Units total) by mouth daily. (Patient taking differently: Take 2 capsules by mouth daily. ) .  Chromium-Cinnamon (CINNAMON PLUS CHROMIUM) [phone] MCG-MG CAPS, Take 2 capsules by mouth daily. .  citalopram (CELEXA) 20 MG tablet, Take 1 tablet Daily for Mood .  famotidine (PEPCID) 40 MG tablet, Take 40 mg by mouth as needed.  .  Multiple Vitamins-Minerals (MEGA MULTIVITAMIN FOR MEN) TABS, Take 1 tablet by mouth  daily. Glory Rosebush ULTRA test strip, USE TO CHECK BLOOD SUGAR 1 TIME DAILY-DX-E11.22 .  Potassium Citrate 15 MEQ (1620 MG) TBCR, Take 1 tablet 2 x /day to Prevent Kidney Stones  Problem list He has History of DVT of lower extremity; CKD stage 3 due to type 2 diabetes mellitus ([place]); Labile hypertension; Hyperlipidemia associated with type 2 diabetes mellitus ([place]); Vitamin D deficiency; Obesity (BMI 30.0-34.9); Medication management; Gastroesophageal reflux disease; Aneurysm of left iliac artery (HCC); Gallstones; Depression; Type 2 diabetes mellitus with stage 3 chronic kidney disease, without long-term current use of insulin ([place]); and BPH with obstruction/lower urinary tract symptoms on their problem list.   Observations/Objective:  BP 120/76   Pulse 76   Temp (!) 97 F (36.1 C)   Resp 16   Ht 5\' 11"  (1.803 m)   Wt 214 lb 9.6 oz (97.3 kg)   BMI 29.93 kg/m   HEENT - WNL. Neck - supple.  Chest - Clear equal BS. (+) tender along 4th ribs bilaterally Cor - Nl HS. RRR w/o sig MGR. PP 1(+). No edema. MS- FROM w/o deformities.  Gait Nl. Neuro -  Nl w/o focal abnormalities.  Assessment and Plan:  1. Chest pain, unspecified type  - DG Chest 2 View  - dexamethasone (DECADRON) 4 MG tablet; Take 1 tab 3 x day - 3 days, then 2 x day - 3 days, then 1 tab daily  Dispense: 20 tablet; Refill: 0  Follow Up Instructions:  I discussed the assessment and treatment plan with the patient. The patient was provided an opportunity to ask questions and all were answered. The patient agreed with the plan and demonstrated an understanding of the instructions.       The patient was advised to call back or seek an in-person evaluation if the symptoms worsen or if the condition fails to improve as anticipated.   Kirtland Bouchard, MD

## 2019-07-17 ENCOUNTER — Encounter: Payer: Self-pay | Admitting: Internal Medicine

## 2019-07-17 ENCOUNTER — Ambulatory Visit: Payer: Medicare Other | Admitting: Internal Medicine

## 2019-07-17 ENCOUNTER — Other Ambulatory Visit: Payer: Self-pay

## 2019-07-17 VITALS — BP 120/76 | HR 76 | Temp 97.0°F | Resp 16 | Ht 71.0 in | Wt 214.6 lb

## 2019-07-17 DIAGNOSIS — R079 Chest pain, unspecified: Secondary | ICD-10-CM

## 2019-07-17 MED ORDER — DEXAMETHASONE 4 MG PO TABS: ORAL_TABLET | ORAL | 0 refills | Status: DC

## 2019-07-17 NOTE — Patient Instructions (Signed)
Chest Wall Pain Chest wall pain is pain in or around the bones and muscles of your chest. Sometimes, an injury causes this pain. Excessive coughing or overuse of arm and chest muscles may also cause chest wall pain. Sometimes, the cause may not be known. This pain may take several weeks or longer to get better. Follow these instructions at home: Managing pain, stiffness, and swelling   If directed, put ice on the painful area: ? Put ice in a plastic bag. ? Place a towel between your skin and the bag. ? Leave the ice on for 20 minutes, 2-3 times per day. Activity  Rest as told by your health care provider.  Avoid activities that cause pain. These include any activities that use your chest muscles or your abdominal and side muscles to lift heavy items. Ask your health care provider what activities are safe for you. General instructions   Take over-the-counter and prescription medicines only as told by your health care provider.  Do not use any products that contain nicotine or tobacco, such as cigarettes, e-cigarettes, and chewing tobacco. These can delay healing after injury. If you need help quitting, ask your health care provider.  Keep all follow-up visits as told by your health care provider. This is important. Contact a health care provider if:  You have a fever.  Your chest pain becomes worse.  You have new symptoms. Get help right away if:  You have nausea or vomiting.  You feel sweaty or light-headed.  You have a cough with mucus from your lungs (sputum) or you cough up blood.  You develop shortness of breath. These symptoms may represent a serious problem that is an emergency. Do not wait to see if the symptoms will go away. Get medical help right away. Call your local emergency services (911 in the [place] Do not drive yourself to the hospital. Summary  Chest wall pain is pain in or around the bones and muscles of your chest.  Depending on the cause, it may be  treated with ice, rest, medicines, and avoiding activities that cause pain.  Contact a health care provider if you have a fever, worsening chest pain, or new symptoms.  Get help right away if you feel light-headed or you develop shortness of breath. These symptoms may be an emergency.   

## 2019-07-18 ENCOUNTER — Ambulatory Visit
Admission: RE | Admit: 2019-07-18 | Discharge: 2019-07-18 | Disposition: A | Discharge status: Home / Self Care | Payer: Medicare Other | Source: Ambulatory Visit | Attending: Internal Medicine | Admitting: Internal Medicine

## 2019-07-18 DIAGNOSIS — R079 Chest pain, unspecified: Secondary | ICD-10-CM

## 2019-07-18 IMAGING — CR DG CHEST 2V
2 series · 2 of 2 positions shown · non-contrast
Comparison: [DATE]

CLINICAL DATA: Chest pain along fourth ribs low bilaterally, no
history of injury

EXAM:
CHEST - 2 VIEW

[w chest pa]
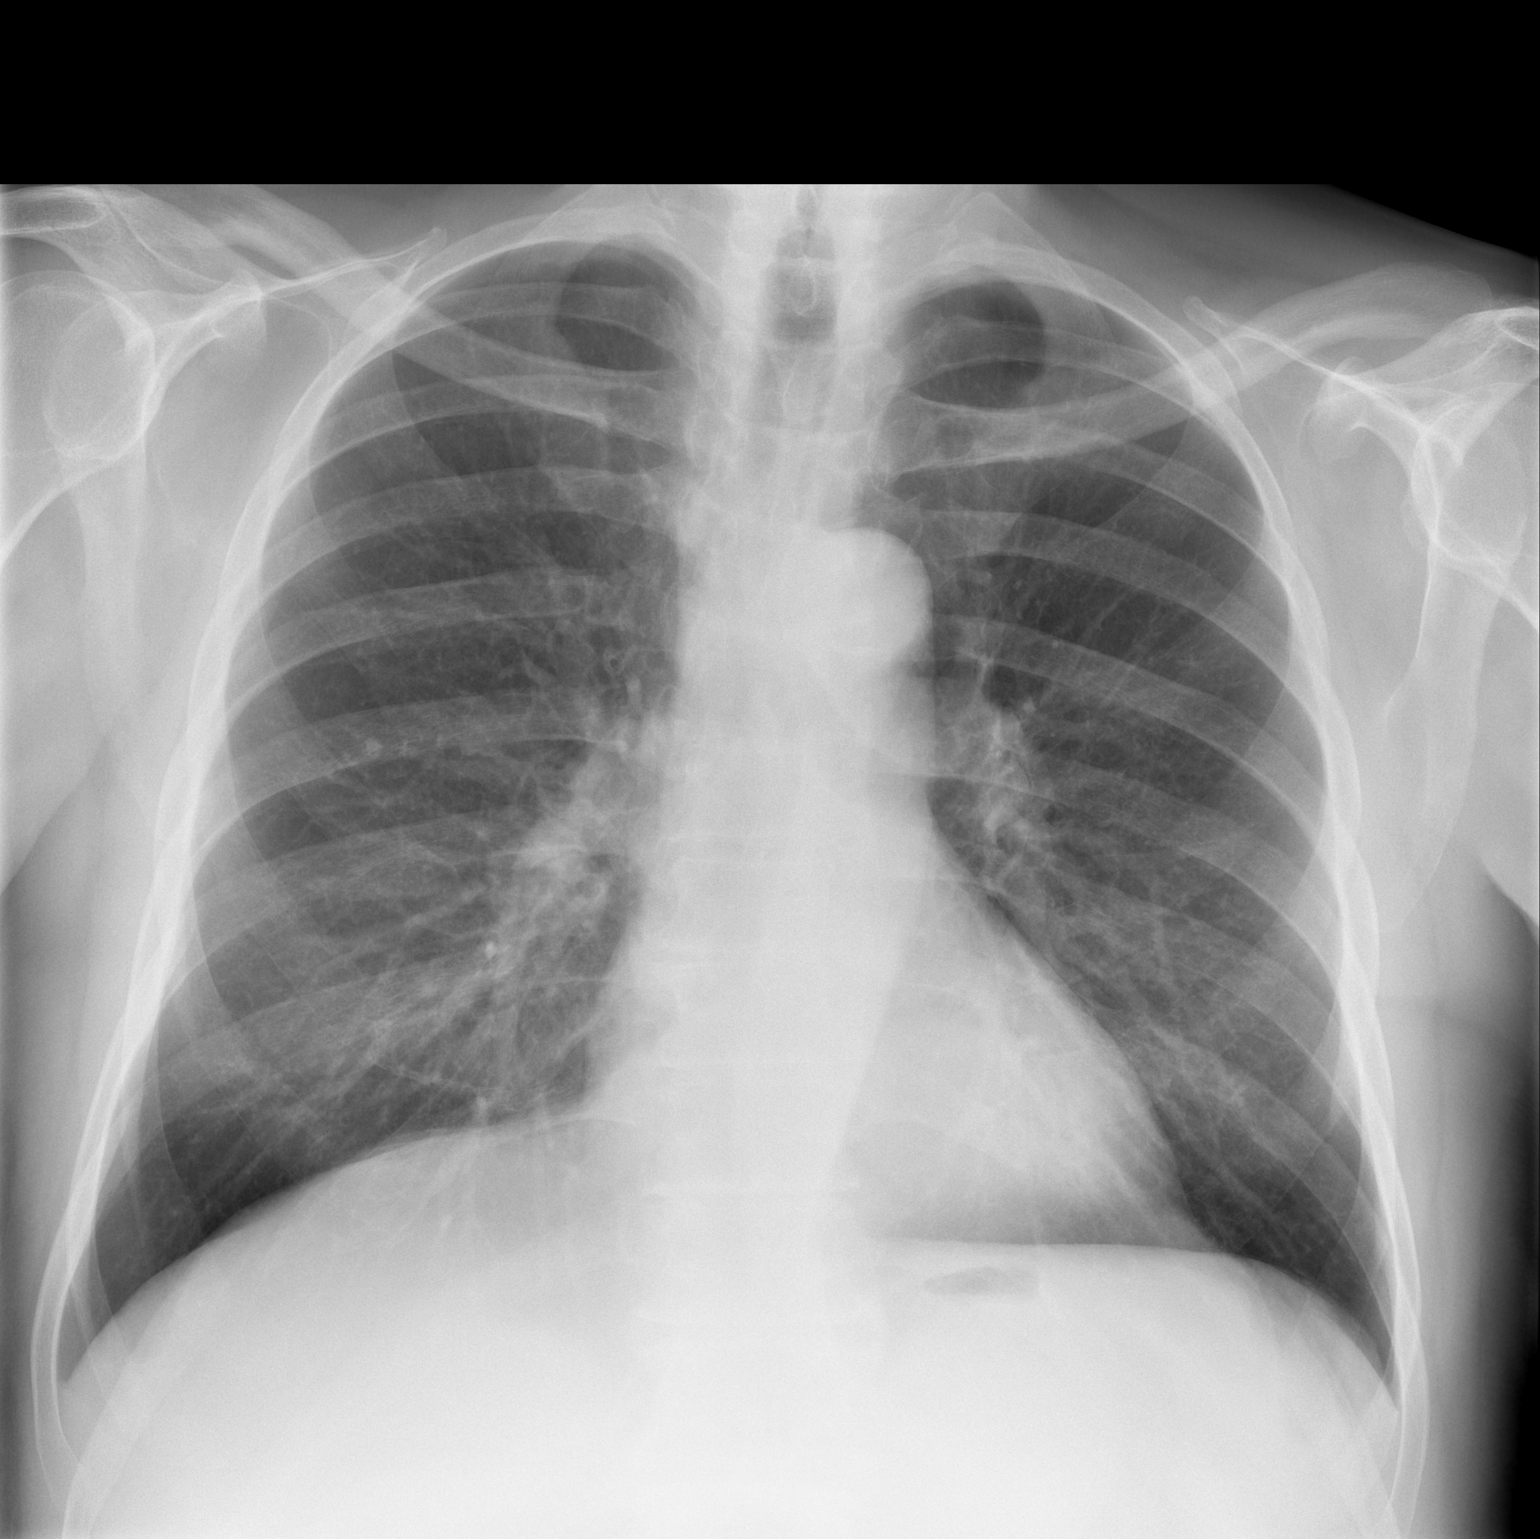

[w chest lat]
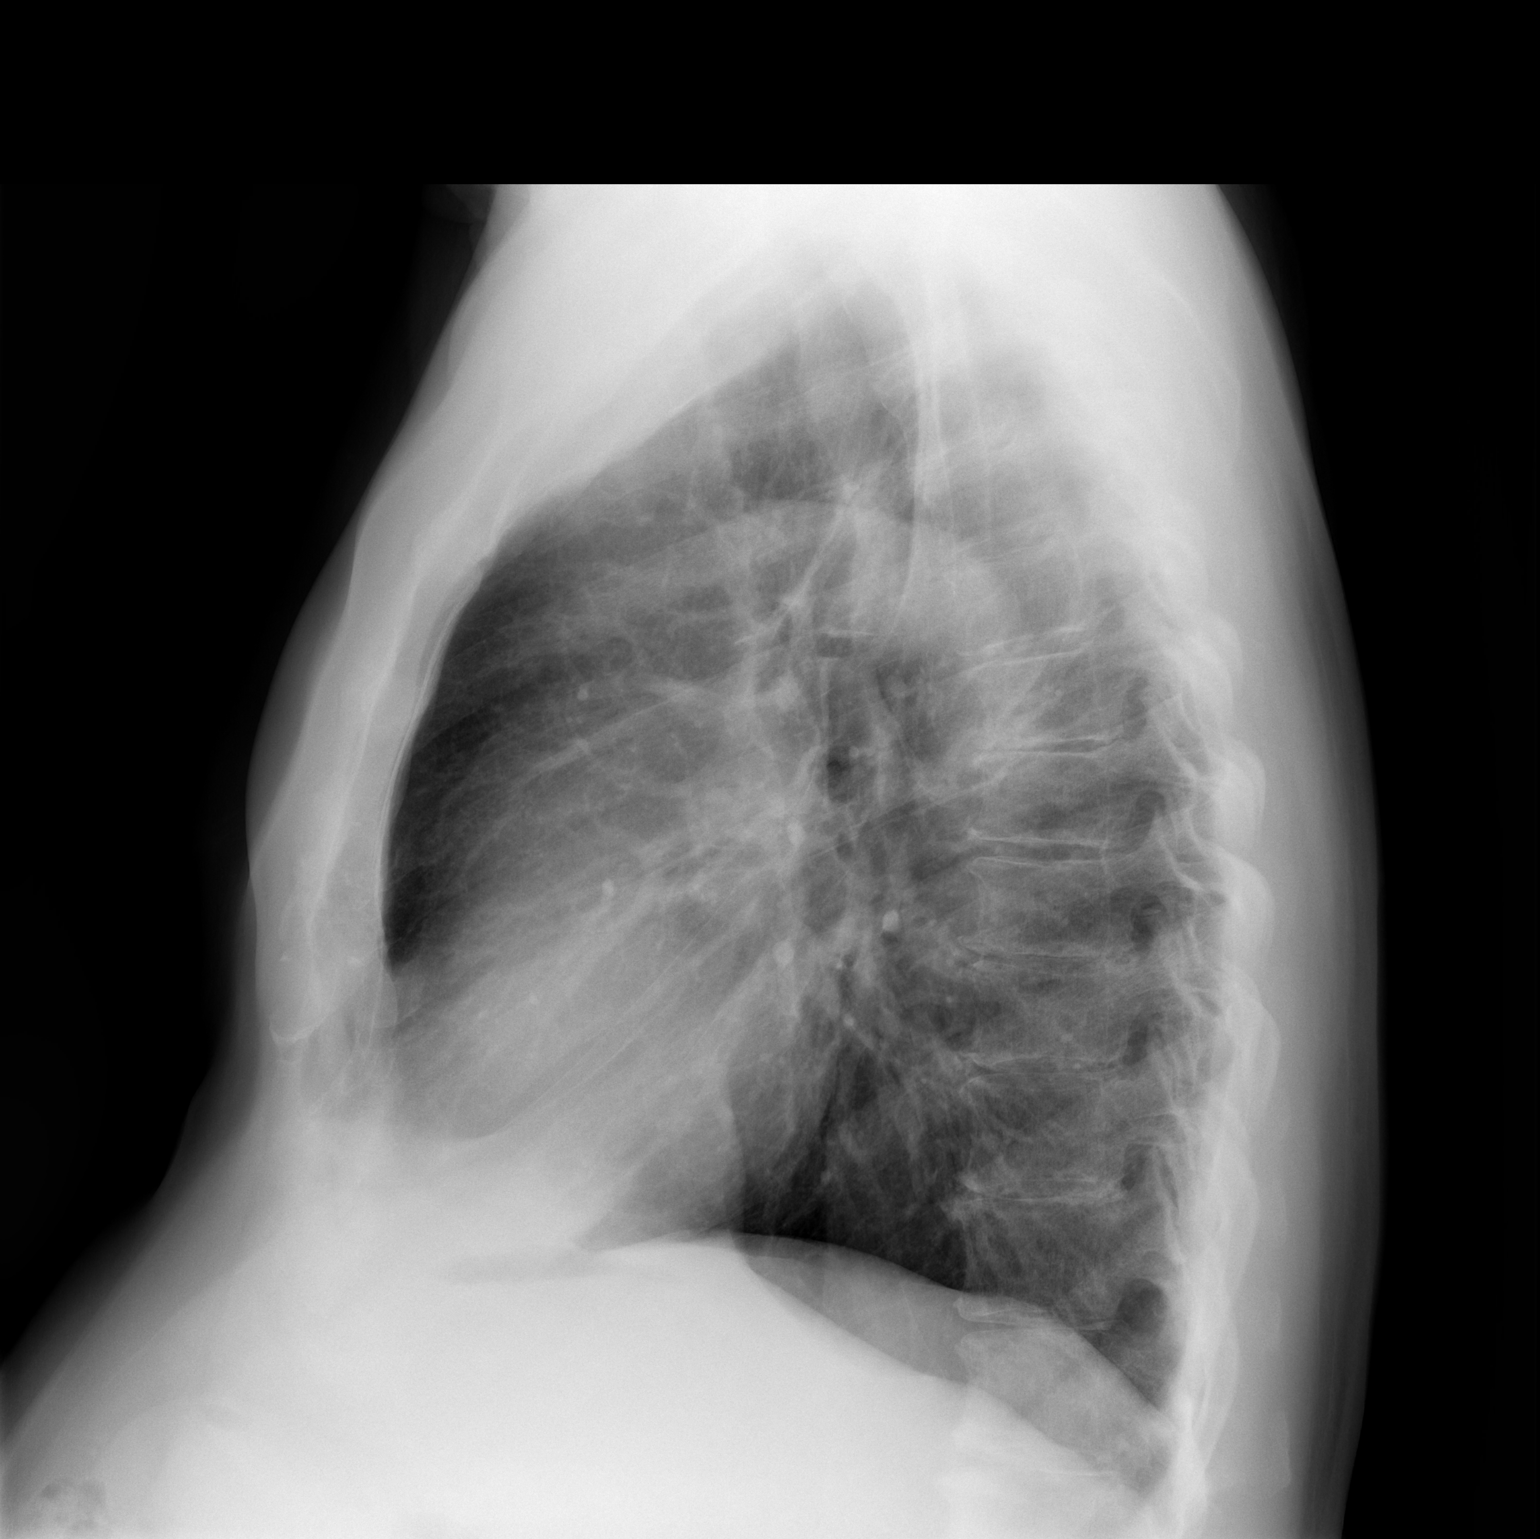

[2 of 2 positions shown; findings below may reference images not displayed]

FINDINGS: Cardiomediastinal contours and hilar structures are normal.

Lungs are clear. No pleural effusion or consolidation.

Visualized skeletal structures are unremarkable.
IMPRESSION: No acute cardiopulmonary disease.

## 2019-07-29 ENCOUNTER — Ambulatory Visit: Payer: Medicare Other | Admitting: Physician Assistant

## 2019-08-08 NOTE — Progress Notes (Signed)
MEDICARE ANNUAL WELLNESS VISIT AND FOLLOW UP Assessment:   Essential hypertension - continue medications, DASH diet, exercise and monitor at home. Call if greater than 130/80.  -     CBC with Differential/Platelet -     COMPLETE METABOLIC PANEL WITH GFR -     TSH  Aneurysm of left iliac artery (HCC) Control blood pressure, cholesterol, glucose, increase exercise.   CKD stage 3 due to type 2 diabetes mellitus ([place]) Discussed general issues about diabetes pathophysiology and management., Educational material distributed., Suggested low cholesterol diet., Encouraged aerobic exercise., Discussed foot care., Reminded to get yearly retinal exam. DEPENDING ON KIDNEY FUNCTION MAY NEED TO CUT BACK ON METFORMIN -     TSH -     Hemoglobin A1c  Type 2 diabetes mellitus with stage 3b chronic kidney disease, without long-term current use of insulin (HCC) -     Hemoglobin A1c MAY NEED TO ADD ON INSULIN WILL CHECK ON PRICES OF GLP AND SGLT2 WITH INSURANCE CLOSE FOLLOW UP 4-6 WEEKS  Diabetic polyneuropathy associated with type 2 diabetes mellitus ([place]) -     Hemoglobin A1c Continue follow up with poditrist  Hyperlipidemia associated with type 2 diabetes mellitus (HCC) -     Lipid panel - CONTINUE CRESTOR GOAL IS LESS THAN 70- MAY HAVE TO INCREASE  Gastroesophageal reflux disease, unspecified whether esophagitis present Continue PPI/H2 blocker, diet discussed  Obesity (BMI 30.0-34.9) - follow up 3 months for progress monitoring - increase veggies, decrease carbs - long discussion about weight loss, diet, and exercise  Mixed hyperlipidemia -continue medications, check lipids, decrease fatty foods, increase activity.  -     Lipid panel  Thrombocytopenia ([place]) Will monitor -     CBC with Differential/Platelet  Recurrent major depressive disorder, in partial remission ([place]) - continue medications, stress management techniques discussed, increase water, good sleep hygiene discussed,  increase exercise, and increase veggies.   Vitamin D deficiency Continue supplement  Medication management -     Hepatic function panel; Future  Testosterone deficiency  check testosterone levels as needed.   Encounter for Medicare annual wellness exam 1 year Get new shingrix  Over 30 minutes of exam, counseling, chart review, and critical decision making was performed  Future Appointments  Date Time Provider [place]  12/16/2019  3:00 PM Unk Pinto, MD [facility] None     Plan:   During the course of the visit the patient was educated and counseled about appropriate screening and preventive services including:    Pneumococcal vaccine   Influenza vaccine  Prevnar 13  Td vaccine  Screening electrocardiogram  Colorectal cancer screening  Diabetes screening  Glaucoma screening  Nutrition counseling    Subjective:  Joshua Peterson is a 79 y.o. male who presents for Medicare Annual Wellness Visit and 3 month follow up for HTN, hyperlipidemia, diabetes, and vitamin D Def.   States last 1-2 months his BM's are different, states will be like a "ribbon" sometimes and he will have a lot of gas. Will be loose occ. No AB pain, no nausea, no black stools, blood in stool.  Last colonoscopy: 10/2017 Dr. Mancel Parsons 5 YEARS Follows with Dr. Karsten Ro (going to retire per patient) for kidney stones. He was put on potassium citrate 15 meq 2 x a day, 1-2 months AGO when symptoms started.   His blood pressure has been controlled at home, today their BP is BP: 118/64 He does not workout. He denies chest pain, shortness of breath, dizziness.   BMI  is Body mass index is 29.43 kg/m., he is working on diet and exercise. Wt Readings from Last 3 Encounters:  08/12/19 211 lb (95.7 kg)  07/17/19 214 lb 9.6 oz (97.3 kg)  05/13/19 209 lb 12.8 oz (95.2 kg)   He has history of recurrent DVTs with travel. He is on cholesterol medication and denies myalgias. His cholesterol is  not at goal. The cholesterol last visit was:   Lab Results  Component Value Date   CHOL 154 05/13/2019   HDL 28 (L) 05/13/2019   LDLCALC 89 05/13/2019   TRIG 305 (H) 05/13/2019   CHOLHDL 5.5 (H) 05/13/2019   He has been working on diet and exercise for Diabetes  with diabetic chronic kidney disease he is on losartan 100mg  Hyperlipidemia on crestor 5 mg not at goal less than 70, on tricor.    he is on metformin 4 a day Cinnamon  Glucotrol 10 mg 3 x a day right after eating Has been on decadron recently DEE 06/2019 no retinopathy  he is checking his sugars once a day in the AM, have not been below 100, he is normally 175 to 210 in the morning.  and denies paresthesia of the feet, polydipsia, polyuria and visual disturbances.  Last A1C was:  Lab Results  Component Value Date   HGBA1C 9.4 (H) 05/13/2019   Last GFR Lab Results  Component Value Date   GFRNONAA 41 (L) 05/13/2019    Patient is on Vitamin D supplement.   Lab Results  Component Value Date   VD25OH 38 05/13/2019   Medication Review:  Current Outpatient Medications (Endocrine & Metabolic):  .  glipiZIDE (GLUCOTROL) 10 MG tablet, Take 1 tablet  3 x  /day with Meals for Diabetes  (Dx: E11.21) .  metFORMIN (GLUCOPHAGE-XR) 500 MG 24 hr tablet, Take 2 tablets  2 x /day with Meals for Diabetes  Current Outpatient Medications (Cardiovascular):  .  fenofibrate (TRICOR) 145 MG tablet, Take 1 tablet Daily for Triglycerides (Blood Fats) (Patient taking differently: Take 145 mg by mouth daily. Take 1 tablet Daily for Triglycerides (Blood Fats)) .  losartan (COZAAR) 100 MG tablet, Take 1 tablet Daily for BP .  rosuvastatin (CRESTOR) 5 MG tablet, Take 1 tablet Daily for Cholesterol  Current Outpatient Medications (Respiratory):  .  albuterol (VENTOLIN HFA) 108 (90 Base) MCG/ACT inhaler,  .  fluticasone (FLONASE) 50 MCG/ACT nasal spray, Place 1 spray into both nostrils daily as needed.  Marland Kitchen  PROAIR HFA 108 (90 Base) MCG/ACT  inhaler,   Current Outpatient Medications (Analgesics):  .  aspirin 81 MG tablet, Take 81 mg by mouth daily.   Current Outpatient Medications (Other):  Marland Kitchen  Cholecalciferol (VITAMIN D3) 5000 units CAPS, Take 2 capsules (10,000 Units total) by mouth daily. (Patient taking differently: Take 2 capsules by mouth daily. ) .  Chromium-Cinnamon (CINNAMON PLUS CHROMIUM) [phone] MCG-MG CAPS, Take 2 capsules by mouth daily. .  citalopram (CELEXA) 20 MG tablet, Take 1 tablet Daily for Mood .  famotidine (PEPCID) 40 MG tablet, Take 40 mg by mouth as needed.  .  Multiple Vitamins-Minerals (MEGA MULTIVITAMIN FOR MEN) TABS, Take 1 tablet by mouth daily. Glory Rosebush ULTRA test strip, USE TO CHECK BLOOD SUGAR 1 TIME DAILY-DX-E11.22 .  Potassium Citrate 15 MEQ (1620 MG) TBCR, Take 1 tablet 2 x /day to Prevent Kidney Stones  Allergies: Allergies  Allergen Reactions  . Doxycycline     unknown  . Niacin And Related Other (See Comments)  flushing  . Sulfa Antibiotics Itching    Current Problems (verified) has History of DVT of lower extremity; CKD stage 3 due to type 2 diabetes mellitus ([place]); Labile hypertension; Hyperlipidemia associated with type 2 diabetes mellitus ([place]); Vitamin D deficiency; Obesity (BMI 30.0-34.9); Medication management; Gastroesophageal reflux disease; Thrombocytopenia ([place]); Aneurysm of left iliac artery (HCC); Gallstones; Depression; Type 2 diabetes mellitus with stage 3 chronic kidney disease, without long-term current use of insulin ([place]); BPH with obstruction/lower urinary tract symptoms; and Diabetic polyneuropathy associated with type 2 diabetes mellitus ([place]) on their problem list.  Screening Tests Immunization History  Administered Date(s) Administered  . Fluad Quad(high Dose 65+) 11/23/2018  . Influenza, High Dose Seasonal PF 01/14/2015, 11/26/2015, 12/29/2016, 12/18/2017  . Moderna SARS-COVID-2 Vaccination 04/04/2019, 05/02/2019  . Pneumococcal Conjugate-13 01/14/2015   . Pneumococcal-Unspecified 04/02/2008  . Td 04/02/2006, 12/29/2016  . Zoster 04/03/2007   Preventative care: Last colonoscopy: 10/2017 Dr. Havery Moros  TDAP 2018 CXR 2017  Names of Other Physician/Practitioners you currently use: 1. [facility] here for primary care 2. Dr. Delman Cheadle, eye doctor, last visit 2019 he is DUE will get next month 3. Dr. Mariea Clonts, dentist, last visit 5 months ago Patient Care Team: Unk Pinto, MD as PCP - General (Internal Medicine) Kathie Rhodes, MD as Consulting Physician (Urology)  Surgical: He  has a past surgical history that includes RIGHT DEEP CERVICAL NODE BIOPSY (01/03/2001); RIGHT CHEEK/MOUTH BIOPSY (09/16/2008  (office)); Colonoscopy (last one 10-25-2017); IVC FILTER INSERTION (11/21/2007); IVC FILTER REMOVAL (01/31/2008); Tonsillectomy (age 50); Orchiopexy (Bilateral, 1950's); Cystoscopy with litholapaxy (N/A, 07/18/2016); and Cystoscopy with litholapaxy (N/A, 10/15/2018). Family His family history includes Alzheimer's disease in his mother; Breast cancer in his sister; Diabetes in his mother; Prostate cancer in his father. Social history  He reports that he quit smoking about 28 years ago. His smoking use included cigarettes. He has a 20.00 pack-year smoking history. He has never used smokeless tobacco. He reports current alcohol use. He reports that he does not use drugs.  MEDICARE WELLNESS OBJECTIVES: Physical activity: Current Exercise Habits: The patient does not participate in regular exercise at present, Exercise limited by: None identified Cardiac risk factors: Cardiac Risk Factors include: advanced age (>10men, >43 women);diabetes mellitus;dyslipidemia;hypertension;male gender;obesity (BMI >30kg/m2);sedentary lifestyle Depression/mood screen:   Depression screen [facility] 2/9 08/12/2019  Decreased Interest 0  Down, Depressed, Hopeless 0  PHQ - 2 Score 0  Altered sleeping 0  Tired, decreased energy 0  Change  in appetite 0  Feeling bad or failure about yourself  0  Trouble concentrating 0  Moving slowly or fidgety/restless 0  Suicidal thoughts 0  PHQ-9 Score 0  Difficult doing work/chores Not difficult at all    ADLs:  In your present state of health, do you have any difficulty performing the following activities: 08/12/2019 07/17/2019  Hearing? Y N  Comment GETTING HEARING AIDS -  Vision? N N  Difficulty concentrating or making decisions? N N  Walking or climbing stairs? N N  Dressing or bathing? N N  Doing errands, shopping? N N  Some recent data might be hidden     Cognitive Testing  Alert? Yes  Normal Appearance?Yes  Oriented to person? Yes  Place? Yes   Time? Yes  Recall of three objects?  Yes  Can perform simple calculations? Yes  Displays appropriate judgment?Yes  Can read the correct time from a watch face?Yes  EOL planning: Does Patient Have a Medical Advance Directive?: Yes Type of Advance Directive: Healthcare Power of  Attorney, Living will Does patient want to make changes to medical advance directive?: No - Patient declined Copy of [place] in Chart?: No - copy requested   Objective:   Today's Vitals   08/12/19 0958  BP: 118/64  Pulse: 62  Temp: (!) 97.3 F (36.3 C)  SpO2: 98%  Weight: 211 lb (95.7 kg)  Height: 5\' 11"  (1.803 m)  PainSc: 0-No pain   Body mass index is 29.43 kg/m.  Wt Readings from Last 3 Encounters:  08/12/19 211 lb (95.7 kg)  07/17/19 214 lb 9.6 oz (97.3 kg)  05/13/19 209 lb 12.8 oz (95.2 kg)     General appearance: alert, no distress, WD/WN, male obese HEENT: normocephalic, sclerae anicteric, TMs pearly, nares patent, no discharge or erythema, pharynx normal.  Left eye with external hordeoleum.  No active drainage or discharge.  No conjunctival injections Oral cavity: MMM, no lesions Neck: supple, no lymphadenopathy, no thyromegaly, no masses Heart: RRR, normal S1, S2, no murmurs Lungs: CTA bilaterally, no  wheezes, rhonchi, or rales Abdomen: +bs, soft, obese, non tender, non distended, no masses, no hepatomegaly, no splenomegaly Musculoskeletal: nontender, no swelling, no obvious deformity Extremities: no edema, no cyanosis, no clubbing Pulses: 2+ symmetric, upper and lower extremities, normal cap refill Neurological: alert, oriented x 3, CN2-12 intact, strength normal upper extremities and lower extremities, sensation decreased bilateral feet, DTRs 2+ throughout, no cerebellar signs, gait normal Psychiatric: normal affect, behavior normal, pleasant   Medicare Attestation I have personally reviewed: The patient's medical and social history Their use of alcohol, tobacco or illicit drugs Their current medications and supplements The patient's functional ability including ADLs,fall risks, home safety risks, cognitive, and hearing and visual impairment Diet and physical activities Evidence for depression or mood disorders  The patient's weight, height, BMI, and visual acuity have been recorded in the chart.  I have made referrals, counseling, and provided education to the patient based on review of the above and I have provided the patient with a written personalized care plan for preventive services.     Vicie Mutters, PA-C   08/12/2019

## 2019-08-12 ENCOUNTER — Encounter: Payer: Self-pay | Admitting: Physician Assistant

## 2019-08-12 ENCOUNTER — Ambulatory Visit: Payer: Medicare Other | Admitting: Physician Assistant

## 2019-08-12 ENCOUNTER — Other Ambulatory Visit: Payer: Self-pay

## 2019-08-12 VITALS — BP 118/64 | HR 62 | Temp 97.3°F | Ht 71.0 in | Wt 211.0 lb

## 2019-08-12 DIAGNOSIS — K8020 Calculus of gallbladder without cholecystitis without obstruction: Secondary | ICD-10-CM

## 2019-08-12 DIAGNOSIS — E669 Obesity, unspecified: Secondary | ICD-10-CM

## 2019-08-12 DIAGNOSIS — K219 Gastro-esophageal reflux disease without esophagitis: Secondary | ICD-10-CM

## 2019-08-12 DIAGNOSIS — N138 Other obstructive and reflux uropathy: Secondary | ICD-10-CM

## 2019-08-12 DIAGNOSIS — Z0000 Encounter for general adult medical examination without abnormal findings: Secondary | ICD-10-CM

## 2019-08-12 DIAGNOSIS — I723 Aneurysm of iliac artery: Secondary | ICD-10-CM | POA: Diagnosis not present

## 2019-08-12 DIAGNOSIS — R6889 Other general symptoms and signs: Secondary | ICD-10-CM | POA: Diagnosis not present

## 2019-08-12 DIAGNOSIS — D696 Thrombocytopenia, unspecified: Secondary | ICD-10-CM

## 2019-08-12 DIAGNOSIS — F3341 Major depressive disorder, recurrent, in partial remission: Secondary | ICD-10-CM | POA: Diagnosis not present

## 2019-08-12 DIAGNOSIS — Z0001 Encounter for general adult medical examination with abnormal findings: Secondary | ICD-10-CM | POA: Diagnosis not present

## 2019-08-12 DIAGNOSIS — N401 Enlarged prostate with lower urinary tract symptoms: Secondary | ICD-10-CM

## 2019-08-12 DIAGNOSIS — Z79899 Other long term (current) drug therapy: Secondary | ICD-10-CM

## 2019-08-12 DIAGNOSIS — R0989 Other specified symptoms and signs involving the circulatory and respiratory systems: Secondary | ICD-10-CM

## 2019-08-12 DIAGNOSIS — E559 Vitamin D deficiency, unspecified: Secondary | ICD-10-CM

## 2019-08-12 DIAGNOSIS — E1121 Type 2 diabetes mellitus with diabetic nephropathy: Secondary | ICD-10-CM | POA: Diagnosis not present

## 2019-08-12 DIAGNOSIS — E1142 Type 2 diabetes mellitus with diabetic polyneuropathy: Secondary | ICD-10-CM

## 2019-08-12 DIAGNOSIS — E1169 Type 2 diabetes mellitus with other specified complication: Secondary | ICD-10-CM

## 2019-08-12 DIAGNOSIS — E1122 Type 2 diabetes mellitus with diabetic chronic kidney disease: Secondary | ICD-10-CM

## 2019-08-12 DIAGNOSIS — N1832 Chronic kidney disease, stage 3b: Secondary | ICD-10-CM

## 2019-08-12 DIAGNOSIS — N183 Chronic kidney disease, stage 3 unspecified: Secondary | ICD-10-CM

## 2019-08-12 DIAGNOSIS — E785 Hyperlipidemia, unspecified: Secondary | ICD-10-CM

## 2019-08-12 NOTE — Patient Instructions (Addendum)
Check to see what tier  Bcise, Trulicity, or ozempic- once a week injectables that help with weight loss and are heart and brain protective  If they do not cover it, suggest switching to insulin  Can also check on farxiga, this one protects your kidney function  DEPENDING ON YOUR KIDNEY FUNCTION WE MAY HAVE TO CUT BACK ON YOUR METFORMIN  STOP THE MARGARITAS  Check to see if you can do hot herbal teas  Check out ComparePet.com.cy  You can find a registered dietitian near you. At the bottom of the website you can search for a dietitian and there is a filter option by speciality as well.  A dietitian has their master's and are an expert at nutrition.  Check with Dr. Edwena Blow and see if you can do 1 of the potassium citrate a day or if you can not can try to add fiber to help  FIBER SUPPLEMENT  Benefiber or Citracel is good for constipation/diarrhea/irritable bowel syndrome, it helps with weight loss and can help lower your bad cholesterol. Please do 1 TBSP in the morning in water, coffee, or tea. It can take up to a month before you can see a difference with your bowel movements. It is cheapest from [employer], [employer], [employer].   Diabetes is a very complicated disease...lets simplify it.  An easy way to look at it to understand the complications is if you think of the extra sugar floating in your blood stream as glass shards floating through your blood stream.    Diabetes affects your small vessels first: 1) The glass shards (sugar) scraps down the tiny blood vessels in your eyes and lead to diabetic retinopathy, the leading cause of blindness in the [place]. Diabetes is the leading cause of newly diagnosed adult (30 to 79 years of age) blindness in the [place].  2) The glass shards scratches down the tiny vessels of your legs leading to nerve damage called neuropathy and can lead to amputations of your feet. More than 60% of all non-traumatic amputations of lower limbs occur in people with  diabetes.  3) Over time the small vessels in your brain are shredded and closed off, individually this does not cause any problems but over a long period of time many of the small vessels being blocked can lead to Vascular Dementia.   4) Your kidney's are a filter system and have a "net" that keeps certain things in the body and lets bad things out. Sugar shreds this net and leads to kidney damage and eventually failure. Decreasing the sugar that is destroying the net and certain blood pressure medications can help stop or decrease progression of kidney disease. Diabetes was the primary cause of kidney failure in 44 percent of all new cases in 2011.  5) Diabetes also destroys the small vessels in your penis that lead to erectile dysfunction. Eventually the vessels are so damaged that you may not be responsive to cialis or viagra.   Diabetes and your large vessels: Your larger vessels consist of your coronary arteries in your heart and the carotid vessels to your brain. Diabetes or even increased sugars put you at 300% increased risk of heart attack and stroke and this is why.. The sugar scrapes down your large blood vessels and your body sees this as an internal injury and tries to repair itself. Just like you get a scab on your skin, your platelets will stick to the blood vessel wall trying to heal it. This is why we  have diabetics on low dose aspirin daily, this prevents the platelets from sticking and can prevent plaque formation. In addition, your body takes cholesterol and tries to shove it into the open wound. This is why we want your LDL, or bad cholesterol, below 70.   The combination of platelets and cholesterol over 5-10 years forms plaque that can break off and cause a heart attack or stroke.   PLEASE REMEMBER:  Diabetes is preventable! Up to 71 percent of complications and morbidities among individuals with type 2 diabetes can be prevented, delayed, or effectively treated and minimized  with regular visits to a health professional, appropriate monitoring and medication, and a healthy diet and lifestyle.   Diabetes or even increased sugars put you at 300% increased risk of heart attack and stroke.  ALSO BEING DIABETIC YOU MAY NOT HAVE ANY PAIN WITH A HEART ATTACK.  Even worse of a chance of no pain if you are a woman.  It is very unlikely that you will have any pain with a heart attack. Likely your symptoms will be very subtle, even for very severe disease.  Your symptoms for a heart attack will likely occur when you exert your self or exercise and include: Shortness of breath Sweating Nausea Dizziness Fast or irregular heart beats Fatigue   It makes me feel better if my diabetics get their heart rate up with exercise once or twice a week and pay close attention to your body. If there is ANY change in your exercise capacity or if you have symptoms above, please STOP and call 911 or call to come to the office.   PLEASE REMEMBER:  Diabetes is preventable! Up to 83 percent of complications and morbidities among individuals with type 2 diabetes can be prevented, delayed, or effectively treated and minimized with regular visits to a health professional, appropriate monitoring and medication, and a healthy diet and lifestyle.   Here is some information to help you keep your heart healthy: Move it! - Aim for 30 mins of activity every day. Take it slowly at first. Talk to [place] before starting any new exercise program.   Lose it.  -Body Mass Index (BMI) can indicate if you need to lose weight. A healthy range is 18.5-24.9. For a BMI calculator, go to [employer]  Waist Management -Excess abdominal fat is a risk factor for heart disease, diabetes, asthma, stroke and more. Ideal waist circumference is less than 35" for women and less than 40" for men.   Eat Right -focus on fruits, vegetables, whole grains, and meals you make yourself. Avoid foods with trans fat and high  sugar/sodium content.   Snooze or Snore? - Loud snoring can be a sign of sleep apnea, a significant risk factor for high blood pressure, heart attach, stroke, and heart arrhythmias.  Kick the habit -Quit Smoking! Avoid second hand smoke. A single cigarette raises your blood pressure for 20 mins and increases the risk of heart attack and stroke for the next 24 hours.   Are Aspirin and Supplements right for you? -Add ENTERIC COATED low dose 81 mg Aspirin daily OR can do every other day if you have easy bruising to protect your heart and head. As well as to reduce risk of Colon Cancer by 20 %, Skin Cancer by 26 % , Melanoma by 46% and Pancreatic cancer by 60%  Say "No to Stress -There may be little you can do about problems that cause stress. However, techniques such as long walks, meditation, and  exercise can help you manage it.   Start Now! - Make changes one at a time and set reasonable goals to increase your likelihood of success.    Ask insurance and pharmacy about shingrix - it is a 2 part shot that we will not be getting in the office.   Suggest getting AFTER covid vaccines, have to wait at least a month This shot can make you feel bad due to such good immune response it can trigger some inflammation so take tylenol or aleve day of or day after and plan on resting.   Can go to [employer]com.br for more information  Shingrix Vaccination  Two vaccines are licensed and recommended to prevent shingles in the [place] Zoster vaccine live (ZVL, Zostavax) has been in use since 2006. Recombinant zoster vaccine (RZV, Shingrix), has been in use since 2017 and is recommended by ACIP as the preferred shingles vaccine.  What Everyone Should Know about Shingles Vaccine (Shingrix) One of the Recommended Vaccines by Disease Shingles vaccination is the only way to protect against shingles and postherpetic neuralgia (PHN), the most common complication  from shingles. CDC recommends that healthy adults 50 years and older get two doses of the shingles vaccine called Shingrix (recombinant zoster vaccine), separated by 2 to 6 months, to prevent shingles and the complications from the disease. Your doctor or pharmacist can give you Shingrix as a shot in your upper arm. Shingrix provides strong protection against shingles and PHN. Two doses of Shingrix is more than 90% effective at preventing shingles and PHN. Protection stays above 85% for at least the first four years after you get vaccinated. Shingrix is the preferred vaccine, over Zostavax (zoster vaccine live), a shingles vaccine in use since 2006. Zostavax may still be used to prevent shingles in healthy adults 60 years and older. For example, you could use Zostavax if a person is allergic to Shingrix, prefers Zostavax, or requests immediate vaccination and Shingrix is unavailable. Who Should Get Shingrix? Healthy adults 50 years and older should get two doses of Shingrix, separated by 2 to 6 months. You should get Shingrix even if in the past you . had shingles  . received Zostavax  . are not sure if you had chickenpox There is no maximum age for getting Shingrix. If you had shingles in the past, you can get Shingrix to help prevent future occurrences of the disease. There is no specific length of time that you need to wait after having shingles before you can receive Shingrix, but generally you should make sure the shingles rash has gone away before getting vaccinated. You can get Shingrix whether or not you remember having had chickenpox in the past. Studies show that more than 99% of Americans 40 years and older have had chickenpox, even if they don't remember having the disease. Chickenpox and shingles are related because they are caused by the same virus (varicella zoster virus). After a person recovers from chickenpox, the virus stays dormant (inactive) in the body. It can reactivate years later  and cause shingles. If you had Zostavax in the recent past, you should wait at least eight weeks before getting Shingrix. Talk to your healthcare provider to determine the best time to get Shingrix. Shingrix is available in [facility] and pharmacies. To find doctor's offices or pharmacies near you that offer the vaccine, visit HealthMap Vaccine FinderExternal. If you have questions about Shingrix, talk with your healthcare provider. Vaccine for Those 50 Years and Older  Shingrix reduces the risk  of shingles and PHN by more than 90% in people 50 and older. CDC recommends the vaccine for healthy adults 32 and older.  Who Should Not Get Shingrix? You should not get Shingrix if you: . have ever had a severe allergic reaction to any component of the vaccine or after a dose of Shingrix  . tested negative for immunity to varicella zoster virus. If you test negative, you should get chickenpox vaccine.  . currently have shingles  . currently are pregnant or breastfeeding. Women who are pregnant or breastfeeding should wait to get Shingrix.  Marland Kitchen receive specific antiviral drugs (acyclovir, famciclovir, or valacyclovir) 24 hours before vaccination (avoid use of these antiviral drugs for 14 days after vaccination)- zoster vaccine live only If you have a minor acute (starts suddenly) illness, such as a cold, you may get Shingrix. But if you have a moderate or severe acute illness, you should usually wait until you recover before getting the vaccine. This includes anyone with a temperature of 101.79F or higher. The side effects of the Shingrix are temporary, and usually last 2 to 3 days. While you may experience pain for a few days after getting Shingrix, the pain will be less severe than having shingles and the complications from the disease. How Well Does Shingrix Work? Two doses of Shingrix provides strong protection against shingles and postherpetic neuralgia (PHN), the most common complication of  shingles. . In adults 29 to 79 years old who got two doses, Shingrix was 97% effective in preventing shingles; among adults 70 years and older, Shingrix was 91% effective.  . In adults 48 to 79 years old who got two doses, Shingrix was 91% effective in preventing PHN; among adults 70 years and older, Shingrix was 89% effective. Shingrix protection remained high (more than 85%) in people 70 years and older throughout the four years following vaccination. Since your risk of shingles and PHN increases as you get older, it is important to have strong protection against shingles in your older years. Top of Page  What Are the Possible Side Effects of Shingrix? Studies show that Shingrix is safe. The vaccine helps your body create a strong defense against shingles. As a result, you are likely to have temporary side effects from getting the shots. The side effects may affect your ability to do normal daily activities for 2 to 3 days. Most people got a sore arm with mild or moderate pain after getting Shingrix, and some also had redness and swelling where they got the shot. Some people felt tired, had muscle pain, a headache, shivering, fever, stomach pain, or nausea. About 1 out of 6 people who got Shingrix experienced side effects that prevented them from doing regular activities. Symptoms went away on their own in about 2 to 3 days. Side effects were more common in younger people. You might have a reaction to the first or second dose of Shingrix, or both doses. If you experience side effects, you may choose to take over-the-counter pain medicine such as ibuprofen or acetaminophen. If you experience side effects from Shingrix, you should report them to the Vaccine Adverse Event Reporting System (VAERS). Your doctor might file this report, or you can do it yourself through the VAERS websiteExternal, or by calling [phone]. If you have any questions about side effects from Shingrix, talk with your  doctor. The shingles vaccine does not contain thimerosal (a preservative containing mercury). Top of Page  When Should I See a Doctor Because of the Side Effects  I Experience From Shingrix? In clinical trials, Shingrix was not associated with serious adverse events. In fact, serious side effects from vaccines are extremely rare. For example, for every 1 million doses of a vaccine given, only one or two people may have a severe allergic reaction. Signs of an allergic reaction happen within minutes or hours after vaccination and include hives, swelling of the face and throat, difficulty breathing, a fast heartbeat, dizziness, or weakness. If you experience these or any other life-threatening symptoms, see a doctor right away. Shingrix causes a strong response in your immune system, so it may produce short-term side effects more intense than you are used to from other vaccines. These side effects can be uncomfortable, but they are expected and usually go away on their own in 2 or 3 days. Top of Page  How Can I Pay For Shingrix? There are several ways shingles vaccine may be paid for: Medicare . Medicare Part D plans cover the shingles vaccine, but there may be a cost to you depending on your plan. There may be a copay for the vaccine, or you may need to pay in full then get reimbursed for a certain amount.  . Medicare Part B does not cover the shingles vaccine. Medicaid . Medicaid may or may not cover the vaccine. Contact your insurer to find out. Private health insurance . Many private health insurance plans will cover the vaccine, but there may be a cost to you depending on your plan. Contact your insurer to find out. Vaccine assistance programs . Some pharmaceutical companies provide vaccines to eligible adults who cannot afford them. You may want to check with the vaccine manufacturer, [employer], about Shingrix. If you do not currently have health insurance, learn more about affordable  health coverage optionsExternal. To find doctor's offices or pharmacies near you that offer the vaccine, visit HealthMap Vaccine FinderExternal.

## 2019-08-13 LAB — COMPLETE METABOLIC PANEL WITH GFR
AG Ratio: 2 (calc) (ref 1.0–2.5)
ALT: 17 U/L (ref 9–46)
AST: 9 U/L — ABNORMAL LOW (ref 10–35)
Albumin: 4.4 g/dL (ref 3.6–5.1)
Alkaline phosphatase (APISO): 41 U/L (ref 35–144)
BUN/Creatinine Ratio: 16 (calc) (ref 6–22)
BUN: 24 mg/dL (ref 7–25)
CO2: 23 mmol/L (ref 20–32)
Calcium: 9.8 mg/dL (ref 8.6–10.3)
Chloride: 106 mmol/L (ref 98–110)
Creat: 1.49 mg/dL — ABNORMAL HIGH (ref 0.70–1.18)
GFR, Est African American: 51 mL/min/{1.73_m2} — ABNORMAL LOW (ref >=60)
GFR, Est Non African American: 44 mL/min/{1.73_m2} — ABNORMAL LOW (ref >=60)
Globulin: 2.2 g/dL (calc) (ref 1.9–3.7)
Glucose, Bld: 362 mg/dL — ABNORMAL HIGH (ref 65–99)
Potassium: 4.8 mmol/L (ref 3.5–5.3)
Sodium: 137 mmol/L (ref 135–146)
Total Bilirubin: 0.8 mg/dL (ref 0.2–1.2)
Total Protein: 6.6 g/dL (ref 6.1–8.1)

## 2019-08-13 LAB — CBC WITH DIFFERENTIAL/PLATELET
Absolute Monocytes: 299 cells/uL (ref 200–950)
Basophils Absolute: 22 cells/uL (ref 0–200)
Basophils Relative: 0.5 %
Eosinophils Absolute: 198 cells/uL (ref 15–500)
Eosinophils Relative: 4.5 %
HCT: 40.7 % (ref 38.5–50.0)
Hemoglobin: 13.2 g/dL (ref 13.2–17.1)
Lymphs Abs: 1368 cells/uL (ref 850–3900)
MCH: 30.4 pg (ref 27.0–33.0)
MCHC: 32.4 g/dL (ref 32.0–36.0)
MCV: 93.8 fL (ref 80.0–100.0)
MPV: 9.8 fL (ref 7.5–12.5)
Monocytes Relative: 6.8 %
Neutro Abs: 2512 cells/uL (ref 1500–7800)
Neutrophils Relative %: 57.1 %
Platelets: 149 10*3/uL (ref 140–400)
RBC: 4.34 10*6/uL (ref 4.20–5.80)
RDW: 13.1 % (ref 11.0–15.0)
Total Lymphocyte: 31.1 %
WBC: 4.4 10*3/uL (ref 3.8–10.8)

## 2019-08-13 LAB — LIPID PANEL
Cholesterol: 138 mg/dL (ref <200)
HDL: 28 mg/dL — ABNORMAL LOW (ref >=40)
LDL Cholesterol (Calc): 65 mg/dL (calc)
Non-HDL Cholesterol (Calc): 110 mg/dL (calc) (ref <130)
Total CHOL/HDL Ratio: 4.9 (calc) (ref <5.0)
Triglycerides: 377 mg/dL — ABNORMAL HIGH (ref <150)

## 2019-08-13 LAB — MAGNESIUM: Magnesium: 2 mg/dL (ref 1.5–2.5)

## 2019-08-13 LAB — HEMOGLOBIN A1C
Hgb A1c MFr Bld: 8.9 % of total Hgb — ABNORMAL HIGH (ref <5.7)
Mean Plasma Glucose: 209 (calc)
eAG (mmol/L): 11.6 (calc)

## 2019-08-13 LAB — VITAMIN D 25 HYDROXY (VIT D DEFICIENCY, FRACTURES): Vit D, 25-Hydroxy: 52 ng/mL (ref 30–100)

## 2019-08-13 LAB — TSH: TSH: 2.25 mIU/L (ref 0.40–4.50)

## 2019-08-15 ENCOUNTER — Ambulatory Visit: Payer: Medicare Other | Admitting: Adult Health Nurse Practitioner

## 2019-08-17 ENCOUNTER — Other Ambulatory Visit: Payer: Self-pay | Admitting: Adult Health

## 2019-08-17 ENCOUNTER — Other Ambulatory Visit: Payer: Self-pay | Admitting: Internal Medicine

## 2019-09-13 ENCOUNTER — Other Ambulatory Visit: Payer: Self-pay | Admitting: Internal Medicine

## 2019-09-13 DIAGNOSIS — J309 Allergic rhinitis, unspecified: Secondary | ICD-10-CM

## 2019-09-25 NOTE — Progress Notes (Signed)
Diabetes Education and Follow-Up Visit  79 y.o.male presents for diabetic education.   He has been working on diet and exercise for Diabetes  with diabetic chronic kidney disease he is on losartan 100mg  Hyperlipidemia on crestor 5 mg- labs at goal   he is on metformin now on 3 a day- decreased last visit due to decreased kidney function Cinnamon  Glucotrol 10 mg 3 x a day right after eating DEE 06/2019 no retinopathy  he is checking his sugars once a day in the AM, have not been below 100, he is normally 175 to 210 in the morning.  and denies paresthesia of the feet, polydipsia, polyuria and visual disturbances.   Last hemoglobin A1c was: Lab Results  Component Value Date   HGBA1C 8.9 (H) 08/12/2019   HGBA1C 9.4 (H) 05/13/2019   HGBA1C 8.4 (H) 02/18/2019     Lab Results  Component Value Date   GFRNONAA 44 (L) 08/12/2019    Lab Results  Component Value Date   CHOL 138 08/12/2019   HDL 28 (L) 08/12/2019   LDLCALC 65 08/12/2019   TRIG 377 (H) 08/12/2019   CHOLHDL 4.9 08/12/2019   BMI is Body mass index is 29.43 kg/m., he is working on diet and exercise. Wt Readings from Last 3 Encounters:  09/26/19 211 lb (95.7 kg)  08/12/19 211 lb (95.7 kg)  07/17/19 214 lb 9.6 oz (97.3 kg)    Blood pressure 120/80, pulse 86, temperature (!) 97.3 F (36.3 C), weight 211 lb (95.7 kg), SpO2 98 %.  Medications  Current Outpatient Medications (Endocrine & Metabolic):  .  glipiZIDE (GLUCOTROL) 10 MG tablet, Take 1 tablet  3 x  /day with Meals for Diabetes  (Dx: E11.21) .  metFORMIN (GLUCOPHAGE-XR) 500 MG 24 hr tablet, Take 2 tablets  2 x /day with Meals for Diabetes  Current Outpatient Medications (Cardiovascular):  .  fenofibrate (TRICOR) 145 MG tablet, Take 1 tablet Daily for Triglycerides (Blood Fats) (Patient taking differently: Take 145 mg by mouth daily. Take 1 tablet Daily for Triglycerides (Blood Fats)) .  losartan (COZAAR) 100 MG tablet, Take 1 tablet Daily for BP .   rosuvastatin (CRESTOR) 5 MG tablet, Take 1 tablet Daily for Cholesterol  Current Outpatient Medications (Respiratory):  .  albuterol (VENTOLIN HFA) 108 (90 Base) MCG/ACT inhaler,  .  fluticasone (FLONASE) 50 MCG/ACT nasal spray, Place 1 spray into both nostrils daily as needed.  Marland Kitchen  PROAIR HFA 108 (90 Base) MCG/ACT inhaler,   Current Outpatient Medications (Analgesics):  .  aspirin 81 MG tablet, Take 81 mg by mouth daily.   Current Outpatient Medications (Other):  Marland Kitchen  Cholecalciferol (VITAMIN D3) 5000 units CAPS, Take 2 capsules (10,000 Units total) by mouth daily. (Patient taking differently: Take 2 capsules by mouth daily. ) .  Chromium-Cinnamon (CINNAMON PLUS CHROMIUM) [phone] MCG-MG CAPS, Take 2 capsules by mouth daily. .  citalopram (CELEXA) 20 MG tablet, Take 1 tablet Daily for Mood .  famotidine (PEPCID) 40 MG tablet, Take 40 mg by mouth as needed.  .  Multiple Vitamins-Minerals (MEGA MULTIVITAMIN FOR MEN) TABS, Take 1 tablet by mouth daily. Glory Rosebush ULTRA test strip, USE TO CHECK BLOOD SUGAR 1 TIME DAILY-DX-E11.22 .  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  Problem List has History of DVT of lower extremity; CKD stage 3 due to type 2 diabetes mellitus ([place]); Labile hypertension; Hyperlipidemia associated with  type 2 diabetes mellitus ([place]); Vitamin D deficiency; Obesity (BMI 30.0-34.9); Medication management; Gastroesophageal reflux disease; Thrombocytopenia ([place]); Aneurysm of left iliac artery ([facility]); Gallstones; Depression; Type 2 diabetes mellitus with stage 3 chronic kidney disease, without long-term current use of insulin ([place]); BPH with obstruction/lower urinary tract symptoms; and Diabetic polyneuropathy associated with type 2 diabetes mellitus ([place]) on their problem list.  Medications  ROS- see HPI  Physical Exam: Blood pressure 120/80, pulse 86, temperature (!) 97.3 F (36.3 C), weight 211 lb (95.7 kg), SpO2 98 %. Body mass index is 29.43 kg/m. General Appearance: Well nourished, in no apparent distress. Eyes: PERRLA, EOMs, conjunctiva no swelling or erythema ENT/Mouth: Ext aud canals clear, TMs without erythema, bulging. No erythema, swelling, or exudate on post pharynx.  Tonsils not swollen or erythematous. Hearing normal.  Respiratory: Respiratory effort normal, BS equal bilaterally without rales, rhonchi, wheezing or stridor.  Cardio: RRR with no MRGs. Brisk peripheral pulses without edema.  Abdomen: Soft, + BS.  Non tender, no guarding, rebound, hernias, masses. Musculoskeletal: Full ROM, 5/5 strength, normal gait.  Skin: Warm, dry without rashes, lesions, ecchymosis.  Neuro: Cranial nerves intact. Normal muscle tone, no cerebellar symptoms. Sensation intact.    Plan and Assessment: Diabetes Education: Reviewed 'ABCs' of diabetes management (respective goals in parentheses):  A1C (<7), blood pressure (<130/80), and cholesterol (LDL <70) Eye Exam yearly and Dental Exam every 6 months. Dietary recommendations Physical Activity recommendations - Strongly advised him to start checking sugars at different times of the day - check 2 times a day, rotating checks - given sugar log and advised how to fill it and to bring it at next appt  - given foot care handout and explained the  principles  - given instructions for hypoglycemia management  - GOING TO TRY TO GET [facility] versus his pharmacy   Future Appointments  Date Time Provider [place]  12/16/2019  3:00 PM Unk Pinto, MD [facility] None  08/20/2020 10:00 AM Vicie Mutters, PA-C [facility] None

## 2019-09-26 ENCOUNTER — Ambulatory Visit: Payer: Medicare Other | Admitting: Physician Assistant

## 2019-09-26 ENCOUNTER — Encounter: Payer: Self-pay | Admitting: Physician Assistant

## 2019-09-26 ENCOUNTER — Other Ambulatory Visit: Payer: Self-pay

## 2019-09-26 VITALS — BP 120/80 | HR 86 | Temp 97.3°F | Wt 211.0 lb

## 2019-09-26 DIAGNOSIS — E1121 Type 2 diabetes mellitus with diabetic nephropathy: Secondary | ICD-10-CM

## 2019-09-26 DIAGNOSIS — E1122 Type 2 diabetes mellitus with diabetic chronic kidney disease: Secondary | ICD-10-CM

## 2019-09-26 DIAGNOSIS — E1142 Type 2 diabetes mellitus with diabetic polyneuropathy: Secondary | ICD-10-CM

## 2019-09-26 DIAGNOSIS — N183 Chronic kidney disease, stage 3 unspecified: Secondary | ICD-10-CM | POA: Diagnosis not present

## 2019-09-26 DIAGNOSIS — N1832 Chronic kidney disease, stage 3b: Secondary | ICD-10-CM

## 2019-09-26 NOTE — Patient Instructions (Addendum)
  Start ozempic injection as shown once a week.  The starting dose is 0.25 mg on the pen for the first 4 weeks.   You may inject in the stomach, thigh or arm.  You may experience nausea in the first few days which usually goes away.    You will feel fullness of the stomach with starting the medication and should try to keep the portions at meals small.   After 4 weeks increase the dose to 0.5mg  daily if no nausea present.    If any questions or concerns are present call the office  After another month we can try to increase to 1 mg but people often experience nausea with this.   Check with your insurance and check with [facility].   If you start on this medication we want to cut back on the glipizde after you are on the 0.5mg  dose for 1 month.   General eating tips  What to Avoid . Avoid added sugars o Often added sugar can be found in processed foods such as many condiments, dry cereals, cakes, cookies, chips, crisps, crackers, candies, sweetened drinks, etc.  o Read labels and AVOID/DECREASE use of foods with the following in their ingredient list: Sugar, fructose, high fructose corn syrup, sucrose, glucose, maltose, dextrose, molasses, cane sugar, brown sugar, any type of syrup, agave nectar, etc.   . Avoid snacking in between meals- drink water or if you feel you need a snack, pick a high water content snack such as cucumbers, watermelon, or any veggie.  Marland Kitchen Avoid foods made with flour o If you are going to eat food made with flour, choose those made with whole-grains; and, minimize your consumption as much as is tolerable . Avoid processed foods o These foods are generally stocked in the middle of the grocery store.  o Focus on shopping on the perimeter of the grocery.  What to Include . Vegetables o GREEN LEAFY VEGETABLES: Kale, spinach, mustard greens, collard greens, cabbage, broccoli, etc. o OTHER: Asparagus, cauliflower, eggplant, carrots, peas, Brussel sprouts, tomatoes, bell  peppers, zucchini, beets, cucumbers, etc. . Grains, seeds, and legumes o Beans: kidney beans, black eyed peas, garbanzo beans, black beans, pinto beans, etc. o Whole, unrefined grains: brown rice, barley, bulgur, oatmeal, etc. . Healthy fats  o Avoid highly processed fats such as vegetable oil o Examples of healthy fats: avocado, olives, virgin olive oil, dark chocolate (?72% Cocoa), nuts (peanuts, almonds, walnuts, cashews, pecans, etc.) o Please still do small amount of these healthy fats, they are dense in calories.  . Low - Moderate Intake of Animal Sources of Protein o Meat sources: chicken, [place], salmon, tuna. Limit to 4 ounces of meat at one time or the size of your palm. o Consider limiting dairy sources, but when choosing dairy focus on: PLAIN [place] yogurt, cottage cheese, high-protein milk . Fruit o Choose berries

## 2019-09-28 LAB — TEST AUTHORIZATION

## 2019-09-28 LAB — CBC WITH DIFFERENTIAL/PLATELET
Absolute Monocytes: 258 cells/uL (ref 200–950)
Basophils Absolute: 48 cells/uL (ref 0–200)
Basophils Relative: 0.8 %
Eosinophils Absolute: 180 cells/uL (ref 15–500)
Eosinophils Relative: 3 %
HCT: 37.5 % — ABNORMAL LOW (ref 38.5–50.0)
Hemoglobin: 12.8 g/dL — ABNORMAL LOW (ref 13.2–17.1)
Lymphs Abs: 1584 cells/uL (ref 850–3900)
MCH: 31.4 pg (ref 27.0–33.0)
MCHC: 34.1 g/dL (ref 32.0–36.0)
MCV: 92.1 fL (ref 80.0–100.0)
MPV: 9.7 fL (ref 7.5–12.5)
Monocytes Relative: 4.3 %
Neutro Abs: 3930 cells/uL (ref 1500–7800)
Neutrophils Relative %: 65.5 %
Platelets: 153 10*3/uL (ref 140–400)
RBC: 4.07 10*6/uL — ABNORMAL LOW (ref 4.20–5.80)
RDW: 13 % (ref 11.0–15.0)
Total Lymphocyte: 26.4 %
WBC: 6 10*3/uL (ref 3.8–10.8)

## 2019-09-28 LAB — COMPLETE METABOLIC PANEL WITH GFR
AG Ratio: 2.1 (calc) (ref 1.0–2.5)
ALT: 17 U/L (ref 9–46)
AST: 14 U/L (ref 10–35)
Albumin: 4.6 g/dL (ref 3.6–5.1)
Alkaline phosphatase (APISO): 36 U/L (ref 35–144)
BUN/Creatinine Ratio: 20 (calc) (ref 6–22)
BUN: 35 mg/dL — ABNORMAL HIGH (ref 7–25)
CO2: 24 mmol/L (ref 20–32)
Calcium: 10.3 mg/dL (ref 8.6–10.3)
Chloride: 106 mmol/L (ref 98–110)
Creat: 1.74 mg/dL — ABNORMAL HIGH (ref 0.70–1.18)
GFR, Est African American: 42 mL/min/{1.73_m2} — ABNORMAL LOW (ref >=60)
GFR, Est Non African American: 36 mL/min/{1.73_m2} — ABNORMAL LOW (ref >=60)
Globulin: 2.2 g/dL (calc) (ref 1.9–3.7)
Glucose, Bld: 186 mg/dL — ABNORMAL HIGH (ref 65–99)
Potassium: 4.3 mmol/L (ref 3.5–5.3)
Sodium: 139 mmol/L (ref 135–146)
Total Bilirubin: 0.6 mg/dL (ref 0.2–1.2)
Total Protein: 6.8 g/dL (ref 6.1–8.1)

## 2019-09-28 LAB — FERRITIN: Ferritin: 212 ng/mL (ref 24–380)

## 2019-09-28 LAB — IRON: Iron: 91 ug/dL (ref 50–180)

## 2019-09-30 ENCOUNTER — Other Ambulatory Visit: Payer: Self-pay | Admitting: Internal Medicine

## 2019-10-04 ENCOUNTER — Other Ambulatory Visit: Payer: Self-pay | Admitting: Internal Medicine

## 2019-10-30 NOTE — Progress Notes (Deleted)
   Subjective:    Patient ID: Joshua Peterson, male    DOB: 1941-01-23, 79 y.o.   MRN: [id number]  HPI 79 y.o. WM with history of DM2, CKD presents with new anemia and worsening kidney function.   His metformin was cut down to 2 a day.  Lab Results  Component Value Date   GFRNONAA 36 (L) 09/26/2019   He had a slight decrease in his H/H with normocytic anemia with normal iron, B12.  Lab Results  Component Value Date   WBC 6.0 09/26/2019   HGB 12.8 (L) 09/26/2019   HCT 37.5 (L) 09/26/2019   MCV 92.1 09/26/2019   PLT 153 09/26/2019   Lab Results  Component Value Date   IRON 91 09/26/2019   FERRITIN 212 09/26/2019   Lab Results  Component Value Date   [id number] 833 11/21/2013   There were no vitals taken for this visit.  Medications  Current Outpatient Medications (Endocrine & Metabolic):  .  glipiZIDE (GLUCOTROL) 10 MG tablet, Take 1 tablet  3 x  /day with Meals for Diabetes  (Dx: E11.21) .  metFORMIN (GLUCOPHAGE-XR) 500 MG 24 hr tablet, Take 2 tablets  2 x /day with Meals for Diabetes  Current Outpatient Medications (Cardiovascular):  .  fenofibrate (TRICOR) 145 MG tablet, TAKE 1 TABLET DAILY FOR TRIGLYCERIDES (BLOOD FATS) .  losartan (COZAAR) 100 MG tablet, Take 1 tablet Daily for BP .  rosuvastatin (CRESTOR) 5 MG tablet, Take 1 tablet Daily for Cholesterol  Current Outpatient Medications (Respiratory):  .  albuterol (VENTOLIN HFA) 108 (90 Base) MCG/ACT inhaler,  .  fluticasone (FLONASE) 50 MCG/ACT nasal spray, Place 1 spray into both nostrils daily as needed.  Marland Kitchen  PROAIR HFA 108 (90 Base) MCG/ACT inhaler,   Current Outpatient Medications (Analgesics):  .  aspirin 81 MG tablet, Take 81 mg by mouth daily.   Current Outpatient Medications (Other):  Marland Kitchen  Cholecalciferol (VITAMIN D3) 5000 units CAPS, Take 2 capsules (10,000 Units total) by mouth daily. (Patient taking differently: Take 2 capsules by mouth daily. ) .  Chromium-Cinnamon (CINNAMON PLUS CHROMIUM) [phone]  MCG-MG CAPS, Take 2 capsules by mouth daily. .  citalopram (CELEXA) 20 MG tablet, Take 1 tablet Daily for Mood .  famotidine (PEPCID) 40 MG tablet, Take 40 mg by mouth as needed.  .  Multiple Vitamins-Minerals (MEGA MULTIVITAMIN FOR MEN) TABS, Take 1 tablet by mouth daily. Glory Rosebush ULTRA test strip, USE TO CHECK BLOOD SUGAR 1 TIME DAILY-DX-E11.22 .  Potassium Citrate 15 MEQ (1620 MG) TBCR, Take 1 tablet 2 x /day to Prevent Kidney Stones  Problem list He has History of DVT of lower extremity; CKD stage 3 due to type 2 diabetes mellitus ([place]); Labile hypertension; Hyperlipidemia associated with type 2 diabetes mellitus ([place]); Vitamin D deficiency; Obesity (BMI 30.0-34.9); Medication management; Gastroesophageal reflux disease; Thrombocytopenia ([place]); Aneurysm of left iliac artery (HCC); Gallstones; Depression; Type 2 diabetes mellitus with stage 3 chronic kidney disease, without long-term current use of insulin ([place]); BPH with obstruction/lower urinary tract symptoms; and Diabetic polyneuropathy associated with type 2 diabetes mellitus ([place]) on their problem list.   Review of Systems     Objective:   Physical Exam        Assessment & Plan:

## 2019-10-31 ENCOUNTER — Ambulatory Visit: Payer: Self-pay | Admitting: Physician Assistant

## 2019-11-15 ENCOUNTER — Ambulatory Visit: Payer: Medicare Other | Admitting: Physician Assistant

## 2019-11-15 ENCOUNTER — Other Ambulatory Visit: Payer: Self-pay

## 2019-11-15 ENCOUNTER — Encounter: Payer: Self-pay | Admitting: Physician Assistant

## 2019-11-15 VITALS — BP 124/80 | Wt 213.0 lb

## 2019-11-15 DIAGNOSIS — E1121 Type 2 diabetes mellitus with diabetic nephropathy: Secondary | ICD-10-CM | POA: Diagnosis not present

## 2019-11-15 DIAGNOSIS — D696 Thrombocytopenia, unspecified: Secondary | ICD-10-CM

## 2019-11-15 DIAGNOSIS — E785 Hyperlipidemia, unspecified: Secondary | ICD-10-CM

## 2019-11-15 DIAGNOSIS — N1832 Chronic kidney disease, stage 3b: Secondary | ICD-10-CM

## 2019-11-15 DIAGNOSIS — E669 Obesity, unspecified: Secondary | ICD-10-CM

## 2019-11-15 DIAGNOSIS — E1142 Type 2 diabetes mellitus with diabetic polyneuropathy: Secondary | ICD-10-CM | POA: Diagnosis not present

## 2019-11-15 DIAGNOSIS — E538 Deficiency of other specified B group vitamins: Secondary | ICD-10-CM

## 2019-11-15 DIAGNOSIS — E1169 Type 2 diabetes mellitus with other specified complication: Secondary | ICD-10-CM | POA: Diagnosis not present

## 2019-11-15 DIAGNOSIS — D649 Anemia, unspecified: Secondary | ICD-10-CM

## 2019-11-15 MED ORDER — OZEMPIC (0.25 OR 0.5 MG/DOSE) 2 MG/1.5ML ~~LOC~~ SOPN: 0.5000 mg | PEN_INJECTOR | SUBCUTANEOUS | 3 refills | Status: DC

## 2019-11-15 NOTE — Patient Instructions (Addendum)
Start TRULICITY injection as shown once a week.  The starting dose is 0.75 mg on the pen for the first 2 weeks.   You may inject in the stomach, thigh or arm.  You may experience nausea in the first few days which usually goes away.   You will feel fullness of the stomach with starting the medication and should try to keep the portions at meals small.   After 2 weeks increase the dose to 1.5mg  daily if no nausea present.    If any questions or concerns are present call the office  After you finish the trulicity you can switch to the ozempic 0.5 mg once weekly for one month  After another month we can try to increase to 1 mg of the ozcempic but people often experience nausea with this.   WHILE YOU ARE ON THIS MEDICATION IF YOU GET LOW SUGARS STOP OR CUT BACK ON THE GLIPIZIDE  CONTINUE GLIPIZIDE FOR TWO WEEKS BUT THEN YOU MAY BE ABLE TO CUT BACK OR STOP  MONITOR SUGARS CLOSELY   MAXIMUM AMOUNT OF TYLENOL IN A DAY  You can take tylenol (500mg ) or tylenol arthritis (650mg ) with the meloxicam/antiinflammatories. The max you can take of tylenol a day is 3000mg  daily, this is a max of 6 pills a day of the regular tyelnol (500mg ) or a max of 4 a day of the tylenol arthritis (650mg ) as long as no other medications you are taking contain tylenol.    Back Exercises The following exercises strengthen the muscles that help to support the trunk and back. They also help to keep the lower back flexible. Doing these exercises can help to prevent back pain or lessen existing pain.  If you have back pain or discomfort, try doing these exercises 2-3 times each day or as told by your health care provider.  As your pain improves, do them once each day, but increase the number of times that you repeat the steps for each exercise (do more repetitions).  To prevent the recurrence of back pain, continue to do these exercises once each day or as told by your health care provider. Do exercises exactly as  told by your health care provider and adjust them as directed. It is normal to feel mild stretching, pulling, tightness, or discomfort as you do these exercises, but you should stop right away if you feel sudden pain or your pain gets worse. Exercises Single knee to chest Repeat these steps 3-5 times for each leg: 1. Lie on your back on a firm bed or the floor with your legs extended. 2. Bring one knee to your chest. Your other leg should stay extended and in contact with the floor. 3. Hold your knee in place by grabbing your knee or thigh with both hands and hold. 4. Pull on your knee until you feel a gentle stretch in your lower back or buttocks. 5. Hold the stretch for 10-30 seconds. 6. Slowly release and straighten your leg. Pelvic tilt Repeat these steps 5-10 times: 1. Lie on your back on a firm bed or the floor with your legs extended. 2. Bend your knees so they are pointing toward the ceiling and your feet are flat on the floor. 3. Tighten your lower abdominal muscles to press your lower back against the floor. This motion will tilt your pelvis so your tailbone points up toward the ceiling instead of pointing to your feet or the floor. 4. With gentle tension and even breathing, hold  this position for 5-10 seconds. Cat-cow Repeat these steps until your lower back becomes more flexible: 1. Get into a hands-and-knees position on a firm surface. Keep your hands under your shoulders, and keep your knees under your hips. You may place padding under your knees for comfort. 2. Let your head hang down toward your chest. Contract your abdominal muscles and point your tailbone toward the floor so your lower back becomes rounded like the back of a cat. 3. Hold this position for 5 seconds. 4. Slowly lift your head, let your abdominal muscles relax and point your tailbone up toward the ceiling so your back forms a sagging arch like the back of a cow. 5. Hold this position for 5  seconds.  Press-ups Repeat these steps 5-10 times: 1. Lie on your abdomen (face-down) on the floor. 2. Place your palms near your head, about shoulder-width apart. 3. Keeping your back as relaxed as possible and keeping your hips on the floor, slowly straighten your arms to raise the top half of your body and lift your shoulders. Do not use your back muscles to raise your upper torso. You may adjust the placement of your hands to make yourself more comfortable. 4. Hold this position for 5 seconds while you keep your back relaxed. 5. Slowly return to lying flat on the floor.  Bridges Repeat these steps 10 times: 1. Lie on your back on a firm surface. 2. Bend your knees so they are pointing toward the ceiling and your feet are flat on the floor. Your arms should be flat at your sides, next to your body. 3. Tighten your buttocks muscles and lift your buttocks off the floor until your waist is at almost the same height as your knees. You should feel the muscles working in your buttocks and the back of your thighs. If you do not feel these muscles, slide your feet 1-2 inches farther away from your buttocks. 4. Hold this position for 3-5 seconds. 5. Slowly lower your hips to the starting position, and allow your buttocks muscles to relax completely. If this exercise is too easy, try doing it with your arms crossed over your chest. Abdominal crunches Repeat these steps 5-10 times: 1. Lie on your back on a firm bed or the floor with your legs extended. 2. Bend your knees so they are pointing toward the ceiling and your feet are flat on the floor. 3. Cross your arms over your chest. 4. Tip your chin slightly toward your chest without bending your neck. 5. Tighten your abdominal muscles and slowly raise your trunk (torso) high enough to lift your shoulder blades a tiny bit off the floor. Avoid raising your torso higher than that because it can put too much stress on your low back and does not help to  strengthen your abdominal muscles. 6. Slowly return to your starting position. Back lifts Repeat these steps 5-10 times: 1. Lie on your abdomen (face-down) with your arms at your sides, and rest your forehead on the floor. 2. Tighten the muscles in your legs and your buttocks. 3. Slowly lift your chest off the floor while you keep your hips pressed to the floor. Keep the back of your head in line with the curve in your back. Your eyes should be looking at the floor. 4. Hold this position for 3-5 seconds. 5. Slowly return to your starting position. Contact a health care provider if:  Your back pain or discomfort gets much worse when you do an  exercise.  Your worsening back pain or discomfort does not lessen within 2 hours after you exercise. If you have any of these problems, stop doing these exercises right away. Do not do them again unless your health care provider says that you can. Get help right away if:  You develop sudden, severe back pain. If this happens, stop doing the exercises right away. Do not do them again unless your health care provider says that you can. This information is not intended to replace advice given to you by your health care provider. Make sure you discuss any questions you have with your health care provider. Document Revised: 07/12/2018 Document Reviewed: 12/07/2017 Elsevier Patient Education  Joshua Peterson.

## 2019-11-15 NOTE — Progress Notes (Signed)
Subjective:    Patient ID: Joshua Peterson, male    DOB: 19-Mar-1941, 79 y.o.   MRN: [id number]  HPI 79 y.o. WM with history of DM2, CKD presents with new anemia and worsening kidney function.   His metformin was cut down to 2 a day.  He was doing better his sugars however his wife, Joshua Peterson, got sick and he grabbed what he could and states that had increased it.  He has been having left hip and back pain in the AM, will take 2-3 advil in the AM that helps and that helps.  Lab Results  Component Value Date   GFRNONAA 60 (L) 09/26/2019   Lab Results  Component Value Date   HGBA1C 8.9 (H) 08/12/2019   He had a slight decrease in his H/H with normocytic anemia with normal iron, B12.  Lab Results  Component Value Date   WBC 6.0 09/26/2019   HGB 12.8 (L) 09/26/2019   HCT 37.5 (L) 09/26/2019   MCV 92.1 09/26/2019   PLT 153 09/26/2019   Lab Results  Component Value Date   IRON 91 09/26/2019   FERRITIN 212 09/26/2019   Lab Results  Component Value Date   VITAMINB12 556 11/21/2013   Blood pressure 124/80, weight 213 lb (96.6 kg).  Medications  Current Outpatient Medications (Endocrine & Metabolic):    glipiZIDE (GLUCOTROL) 10 MG tablet, Take 1 tablet  3 x  /day with Meals for Diabetes  (Dx: E11.21)   metFORMIN (GLUCOPHAGE-XR) 500 MG 24 hr tablet, Take 2 tablets  2 x /day with Meals for Diabetes   Semaglutide,0.25 or 0.5MG /DOS, (OZEMPIC, 0.25 OR 0.5 MG/DOSE,) 2 MG/1.5ML SOPN, Inject 0.375 mLs (0.5 mg total) into the skin once a week.  Current Outpatient Medications (Cardiovascular):    fenofibrate (TRICOR) 145 MG tablet, TAKE 1 TABLET DAILY FOR TRIGLYCERIDES (BLOOD FATS)   losartan (COZAAR) 100 MG tablet, Take 1 tablet Daily for BP   rosuvastatin (CRESTOR) 5 MG tablet, Take 1 tablet Daily for Cholesterol  Current Outpatient Medications (Respiratory):    albuterol (VENTOLIN HFA) 108 (90 Base) MCG/ACT inhaler,    fluticasone (FLONASE) 50 MCG/ACT nasal spray, Place 1 spray  into both nostrils daily as needed.    PROAIR HFA 108 (90 Base) MCG/ACT inhaler,   Current Outpatient Medications (Analgesics):    aspirin 81 MG tablet, Take 81 mg by mouth daily.   Current Outpatient Medications (Other):    Cholecalciferol (VITAMIN D3) 5000 units CAPS, Take 2 capsules (10,000 Units total) by mouth daily. (Patient taking differently: Take 2 capsules by mouth daily. )   Chromium-Cinnamon (CINNAMON PLUS CHROMIUM) [phone] MCG-MG CAPS, Take 2 capsules by mouth daily.   citalopram (CELEXA) 20 MG tablet, Take 1 tablet Daily for Mood   famotidine (PEPCID) 40 MG tablet, Take 40 mg by mouth as needed.    Multiple Vitamins-Minerals (MEGA MULTIVITAMIN FOR MEN) TABS, Take 1 tablet by mouth daily.   ONETOUCH ULTRA test strip, USE TO CHECK BLOOD SUGAR 1 TIME DAILY-DX-E11.22   Potassium Citrate 15 MEQ (1620 MG) TBCR, Take 1 tablet 2 x /day to Prevent Kidney Stones  Problem list He has History of DVT of lower extremity; CKD stage 3 due to type 2 diabetes mellitus (Joshua Peterson); Labile hypertension; Hyperlipidemia associated with type 2 diabetes mellitus (Joshua Peterson); Vitamin D deficiency; Obesity (BMI 30.0-34.9); Medication management; Gastroesophageal reflux disease; Thrombocytopenia (Joshua Peterson); Aneurysm of left iliac artery (HCC); Gallstones; Depression; Type 2 diabetes mellitus with stage 3 chronic kidney disease, without long-term current use of  insulin (Joshua Peterson); BPH with obstruction/lower urinary tract symptoms; and Diabetic polyneuropathy associated with type 2 diabetes mellitus (Joshua Peterson) on their problem list.   Review of Systems SEE HPI    Objective:   Physical Exam General Appearance: Well nourished, in no apparent distress. Eyes: PERRLA, EOMs, conjunctiva no swelling or erythema ENT/Mouth: Ext aud canals clear, TMs without erythema, bulging. No erythema, swelling, or exudate on post pharynx.  Tonsils not swollen or erythematous. Hearing normal.  Respiratory: Respiratory effort normal, BS equal  bilaterally without rales, rhonchi, wheezing or stridor.  Cardio: RRR with no MRGs. Brisk peripheral pulses without edema.  Abdomen: Soft, + BS.  Non tender, no guarding, rebound, hernias, masses. Musculoskeletal: Full ROM, 5/5 strength, normal gait.  Skin: Warm, dry without rashes, lesions, ecchymosis.  Neuro: Cranial nerves intact. Normal muscle tone, no cerebellar symptoms. Sensation intac       Assessment & Plan:   Type 2 diabetes mellitus with stage 3b chronic kidney disease, without long-term current use of insulin (HCC) CONTINUE THE METFORMIN AT LOWER DOSE RECHECK KIDNEY FUNCTION CAN TAKE TYLENOL, AVOID IBUPROFEN WILL TRY TO ADD GLP TO HELP THE PATIENT AID IN HIS WEIGHT LOSS GOAL AND FOR THE MI/STROKE PROTECTION KEEP FOLLOW UP MAY REFER TO KIDNEY DOCTOR PENDING NEW MEDS -     Semaglutide,0.25 or 0.5MG /DOS, (OZEMPIC, 0.25 OR 0.5 MG/DOSE,) 2 MG/1.5ML SOPN; Inject 0.375 mLs (0.5 mg total) into the skin once a week. -     COMPLETE METABOLIC PANEL WITH GFR  Diabetic polyneuropathy associated with type 2 diabetes mellitus (Joshua Peterson) -     Semaglutide,0.25 or 0.5MG /DOS, (OZEMPIC, 0.25 OR 0.5 MG/DOSE,) 2 MG/1.5ML SOPN; Inject 0.375 mLs (0.5 mg total) into the skin once a week. -     COMPLETE METABOLIC PANEL WITH GFR  Thrombocytopenia (HCC) -     CBC with Differential/Platelet  Hyperlipidemia associated with type 2 diabetes mellitus (HCC) check lipids NEXT OV, GOAL 70 decrease fatty foods increase activity.   Obesity (BMI 30.0-34.9)  Anemia, unspecified type -     Folate RBC -     Reticulocytes -     Vitamin B12 - HE HAD A NORMAL IRON/FERRITIN, NEW NORMOCYTIC ANEMIA - LIKELY HAS TO DO WITH ANEMIA OF CHRONIC DISEASE/CKD    Future Appointments  Date Time Provider [place]  12/16/2019  3:00 PM Unk Pinto, MD [facility] None  08/20/2020 10:00 AM Vicie Mutters, PA-C [facility] None

## 2019-11-17 LAB — CBC WITH DIFFERENTIAL/PLATELET
Absolute Monocytes: 280 cells/uL (ref 200–950)
Basophils Absolute: 39 cells/uL (ref 0–200)
Basophils Relative: 0.6 %
Eosinophils Absolute: 150 cells/uL (ref 15–500)
Eosinophils Relative: 2.3 %
HCT: 39.2 % (ref 38.5–50.0)
Hemoglobin: 13.4 g/dL (ref 13.2–17.1)
Lymphs Abs: 1619 cells/uL (ref 850–3900)
MCH: 32.1 pg (ref 27.0–33.0)
MCHC: 34.2 g/dL (ref 32.0–36.0)
MCV: 94 fL (ref 80.0–100.0)
MPV: 10 fL (ref 7.5–12.5)
Monocytes Relative: 4.3 %
Neutro Abs: 4414 cells/uL (ref 1500–7800)
Neutrophils Relative %: 67.9 %
Platelets: 132 10*3/uL — ABNORMAL LOW (ref 140–400)
RBC: 4.17 10*6/uL — ABNORMAL LOW (ref 4.20–5.80)
RDW: 12.3 % (ref 11.0–15.0)
Total Lymphocyte: 24.9 %
WBC: 6.5 10*3/uL (ref 3.8–10.8)

## 2019-11-17 LAB — COMPLETE METABOLIC PANEL WITH GFR
AG Ratio: 2.5 (calc) (ref 1.0–2.5)
ALT: 18 U/L (ref 9–46)
AST: 15 U/L (ref 10–35)
Albumin: 4.5 g/dL (ref 3.6–5.1)
Alkaline phosphatase (APISO): 44 U/L (ref 35–144)
BUN/Creatinine Ratio: 20 (calc) (ref 6–22)
BUN: 33 mg/dL — ABNORMAL HIGH (ref 7–25)
CO2: 24 mmol/L (ref 20–32)
Calcium: 10 mg/dL (ref 8.6–10.3)
Chloride: 106 mmol/L (ref 98–110)
Creat: 1.66 mg/dL — ABNORMAL HIGH (ref 0.70–1.18)
GFR, Est African American: 45 mL/min/{1.73_m2} — ABNORMAL LOW (ref >=60)
GFR, Est Non African American: 39 mL/min/{1.73_m2} — ABNORMAL LOW (ref >=60)
Globulin: 1.8 g/dL (calc) — ABNORMAL LOW (ref 1.9–3.7)
Glucose, Bld: 203 mg/dL — ABNORMAL HIGH (ref 65–99)
Potassium: 4.5 mmol/L (ref 3.5–5.3)
Sodium: 138 mmol/L (ref 135–146)
Total Bilirubin: 0.6 mg/dL (ref 0.2–1.2)
Total Protein: 6.3 g/dL (ref 6.1–8.1)

## 2019-11-17 LAB — FOLATE RBC: RBC Folate: 879 ng/mL RBC (ref >280)

## 2019-11-17 LAB — RETICULOCYTES
ABS Retic: 87570 cells/uL (ref 25000–9000)
Retic Ct Pct: 2.1 %

## 2019-11-17 LAB — VITAMIN B12: Vitamin B-12: 600 pg/mL (ref 200–1100)

## 2019-11-28 ENCOUNTER — Other Ambulatory Visit: Payer: Self-pay | Admitting: Internal Medicine

## 2019-12-07 ENCOUNTER — Other Ambulatory Visit: Payer: Self-pay | Admitting: Internal Medicine

## 2019-12-09 ENCOUNTER — Other Ambulatory Visit: Payer: Self-pay | Admitting: Adult Health

## 2019-12-15 ENCOUNTER — Encounter: Payer: Self-pay | Admitting: Internal Medicine

## 2019-12-15 DIAGNOSIS — I700 Atherosclerosis of aorta: Secondary | ICD-10-CM | POA: Insufficient documentation

## 2019-12-15 NOTE — Progress Notes (Signed)
Annual  Screening/Preventative Visit  & Comprehensive Evaluation & Examination     This very nice 79 y.o.  MWM presents for a Screening /Preventative Visit & comprehensive evaluation and management of multiple medical co-morbidities.  Patient has been followed for HTN, HLD, T2_NIDDM  and Vitamin D Deficiency.     HTN predates since 2004. Patient's BP has been controlled at home.  Today's BP is at goal - 110/62. Patient denies any cardiac symptoms as chest pain, palpitations, shortness of breath, dizziness or ankle swelling.     Patient's hyperlipidemia is controlled with diet and medications. Patient denies myalgias or other medication SE's. 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  Patient has always endorsed poor dietary compliance and last A1c was not at goal. He was started on Trulicity sx's at last OV & given Rx Ozempic. He reports CBG's have  improved to the 100's range:  Lab Results  Component Value Date   HGBA1C 8.9 (H) 08/12/2019        Finally, patient has history of Vitamin D Deficiency ("24" /2008)and last vitamin D was still sl low (goal 70-100):  Lab Results  Component Value Date   VD25OH 52 08/12/2019    Current Outpatient Medications on File Prior to Visit  Medication Sig  . albuterol (VENTOLIN HFA) 108 (90 Base) MCG/ACT inhaler   . aspirin 81 MG tablet Take 81 mg by mouth daily.  . Cholecalciferol (VITAMIN D3) 5000 units CAPS Take 2 capsules (10,000 Units total) by mouth daily. (Patient taking differently: Take 2 capsules by mouth daily. )  . Chromium-Cinnamon (CINNAMON PLUS CHROMIUM) [phone] MCG-MG CAPS Take 2 capsules by mouth daily.   . citalopram (CELEXA) 20 MG tablet Take 1 tablet Daily for Mood  . famotidine (PEPCID) 40 MG tablet Take 40 mg by mouth as needed.   . fenofibrate (TRICOR) 145 MG tablet TAKE 1 TABLET DAILY   . FLONASE nasal spray Place 1 spray into both nostrils daily as needed.   Marland Kitchen glipiZIDE (GLUCOTROL) 10 MG tablet Take 1 tablet  3 x  /day with Meals   . losartan (COZAAR) 100 MG tablet TAKE 1 TABLET DAILY   . metFORMIN-XR 500 MG 24 hr tablet TAKE 2 TABLETS TWICE A DAY   . Multiple Vitamins-Minerals (MEGA MULTIVITAMIN FOR MEN) TABS Take 1 tablet by mouth daily.  . Potassium Citrate 15 MEQ  Take 1 tablet 2 x /day to Prevent Kidney Stones  . PROAIR HFA  inhaler   . rosuvastatin  5 MG tablet Take 1 tablet Daily for Cholesterol  . Semaglutide,0.25 or 0.5MG /DOS, (OZEMPIC, 0.25 OR 0.5 MG/DOSE,) 2 MG/1.5ML SOPN Inject 0.375 mLs (0.5 mg total) into the skin once a week.   No current facility-administered medications on file prior to visit.   Allergies  Allergen Reactions  . Doxycycline     unknown  . Niacin And Related Other (See Comments)    flushing  . Sulfa Antibiotics Itching   Past Medical History:  Diagnosis Date  . Allergic rhinitis   . Bladder stones   . CKD (chronic kidney disease), stage III   . 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 Environmental and seasonal allergies   . External carotid artery stenosis     per last duplex 05-18-2011  right moderate stenosis and left moderate to severe  . Hematuria   . History of adenomatous polyp of colon    tubular adenoma's  . History of bladder stone   . History of DVT in adulthood    08/ 2009 right lower extremity;  2013  left lower extremity  (10-11-2018 per pt no dvt since 2013)  . History of kidney stones   . History of pulmonary embolism    bilatera lung  08/ 2009  (10-11-2018  per pt no pe since 2009)  . History of wheezing    allergies--- prn inhaler  . Hypertension   . Hypogonadism  male   . Internal carotid artery stenosis, bilateral     per last duplex 05-18-2011 in epic,   bilateral 40-59% stenosis  (10-11-2018  per pt denies stroke/tia S&S)  . Lower urinary tract symptoms (LUTS)   . Mixed hyperlipidemia   . Nephrolithiasis   . Testosterone deficiency   . Type 2 diabetes mellitus ([place])   . Vitamin D deficiency   . Wears glasses    Health Maintenance  Topic Date Due  . Hepatitis C Screening  Never done  . INFLUENZA VACCINE  10/20/2019  . HEMOGLOBIN A1C  02/12/2020  . OPHTHALMOLOGY EXAM  06/26/2020  . FOOT EXAM  08/11/2020  . COLONOSCOPY  10/25/2020  . TETANUS/TDAP  12/30/2026  . COVID-19 Vaccine  Completed  . PNA vac Low Risk Adult  Completed   Immunization History  Administered Date(s) Administered  . Fluad Quad(high Dose 65+) 11/23/2018  . Influenza, High Dose Seasonal PF 01/14/2015, 11/26/2015, 12/29/2016, 12/18/2017  . Moderna SARS-COVID-2 Vaccination 04/04/2019, 05/02/2019  . Pneumococcal Conjugate-13 01/14/2015  . Pneumococcal-Unspecified 04/02/2008  . Td 04/02/2006, 12/29/2016  . Zoster 04/03/2007   Last Colon - 10/25/2017 - Dr Havery Moros - Recc 3 yr f/u due Aug 2022  Past Surgical History:  Procedure Laterality Date  . COLONOSCOPY  last one 10-25-2017  . CYSTOSCOPY WITH LITHOLAPAXY N/A 07/18/2016   Procedure: CYSTOSCOPY WITH LITHOLAPAXY;  Surgeon: Kathie Rhodes, MD;  Location[facility];  Service: Urology;  Laterality: N/A;  . CYSTOSCOPY WITH LITHOLAPAXY N/A 10/15/2018   Procedure: CYSTOSCOPY WITH LITHOLAPAXY;  Surgeon: Kathie Rhodes, MD;  Location: [facility];  Service: Urology;  Laterality: N/A;  . IVC FILTER INSERTION  11/21/2007  . IVC FILTER REMOVAL  01/31/2008  . 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 RIGHT CHEEK/MOUTH BIOPSY  09/16/2008  (office)   squamous hyperplasia w/ hyperkeratosis  . RIGHT DEEP CERVICAL NODE BIOPSY  01/03/2001   right posterior neck (per path report-- lymphoid  hyperplasia w/ florid follicular hyperplasia, no malignancy)  . TONSILLECTOMY  age 16   Family History  Problem Relation Age of Onset  . Alzheimer's disease Mother   . Diabetes Mother   . Prostate cancer Father   . Breast cancer Sister   . Colon cancer Neg Hx   . Rectal cancer Neg Hx   . Stomach cancer Neg Hx   . Esophageal cancer Neg Hx    Social History   Socioeconomic History  . Marital status: Married    Spouse name: [name]  . Number of children: None  Occupational History  . Retired city Writer  Tobacco Use  . Smoking status: Former Smoker  Packs/day: 1.00    Years: 20.00    Pack years: 20.00    Types: Cigarettes    Quit date: 04/16/1991    Years since quitting: 28.6  . Smokeless tobacco: Never Used  Substance and Sexual Activity  . Alcohol use: Yes    Comment: ocassional   . Drug use: Never  . Sexual activity: Not on file    ROS Constitutional: Denies fever, chills, weight loss/gain, headaches, insomnia,  night sweats or change in appetite. Does c/o fatigue. Eyes: Denies redness, blurred vision, diplopia, discharge, itchy or watery eyes.  ENT: Denies discharge, congestion, post nasal drip, epistaxis, sore throat, earache, hearing loss, dental pain, Tinnitus, Vertigo, Sinus pain or snoring.  Cardio: Denies chest pain, palpitations, irregular heartbeat, syncope, dyspnea, diaphoresis, orthopnea, PND, claudication or edema Respiratory: denies cough, dyspnea, DOE, pleurisy, hoarseness, laryngitis or wheezing.  Gastrointestinal: Denies dysphagia, heartburn, reflux, water brash, pain, cramps, nausea, vomiting, bloating, diarrhea, constipation, hematemesis, melena, hematochezia, jaundice or hemorrhoids Genitourinary: Denies dysuria, frequency, discharge, hematuria or flank pain. Has urgency, nocturia x 2-3 & occasional hesitancy. Musculoskeletal: Denies arthralgia, myalgia, stiffness, Jt. Swelling, pain, limp or strain/sprain. Denies Falls. Skin: Denies puritis,  rash, hives, warts, acne, eczema or change in skin lesion Neuro: No weakness, tremor, incoordination, spasms, paresthesia or pain Psychiatric: Denies confusion, memory loss or sensory loss. Denies Depression. Endocrine: Denies change in weight, skin, hair change, nocturia, and paresthesia, diabetic polys, visual blurring or hyper / hypo glycemic episodes.  Heme/Lymph: No excessive bleeding, bruising or enlarged lymph nodes.  Physical Exam  BP 110/62   Pulse 80   Temp (!) 97 F (36.1 C)   Resp 16   Ht 5\' 11"  (1.803 m)   Wt 215 lb 9.6 oz (97.8 kg)   BMI 30.07 kg/m   General Appearance: Over nourished and well groomed and in no apparent distress.  Eyes: PERRLA, EOMs, conjunctiva no swelling or erythema, normal fundi and vessels. Sinuses: No frontal/maxillary tenderness ENT/Mouth: EACs patent / TMs  nl. Nares clear without erythema, swelling, mucoid exudates. Oral hygiene is good. No erythema, swelling, or exudate. Tongue normal, non-obstructing. Tonsils not swollen or erythematous. Hearing normal.  Neck: Supple, thyroid not palpable. No bruits, nodes or JVD. Respiratory: Respiratory effort normal.  BS equal and clear bilateral without rales, rhonci, wheezing or stridor. Cardio: Heart sounds are normal with regular rate and rhythm and no murmurs, rubs or gallops. Peripheral pulses are normal and equal bilaterally without edema. No aortic or femoral bruits. Chest: symmetric with normal excursions and percussion.  Abdomen: Soft, obese with Nl bowel sounds. Nontender, no guarding, rebound, hernias, masses, or organomegaly.  Lymphatics: Non tender without lymphadenopathy.  Musculoskeletal: Full ROM all peripheral extremities, joint stability, 5/5 strength, and normal gait. Skin: Warm and dry without rashes, lesions, cyanosis, clubbing or  ecchymosis.  Neuro: Cranial nerves intact, reflexes equal bilaterally. Normal muscle tone, no cerebellar symptoms. Sensation slightly decreased in a stocking  distribution to touch, vibratory and Monofilament to the toes bilaterally. Pysch: Alert and oriented X 3 with normal affect, insight and judgment appropriate.   Assessment and Plan  1. Annual Preventative/Screening Exam    2. Essential hypertension  - EKG 12-Lead - [place], RETROPERITNL ABD,  LTD - Urinalysis, Routine w reflex microscopic - Microalbumin / creatinine urine ratio - CBC with Differential/Platelet - COMPLETE METABOLIC PANEL WITH GFR - Magnesium - TSH  3. Hyperlipidemia associated with type 2 diabetes mellitus ([place])  - EKG 12-Lead - [place], RETROPERITNL ABD,  LTD - Lipid panel -  TSH  4. Type 2 Diabetes Mellitus with Stage 3b Chronic Kidney Disease,  without long-term current use of insulin ([place])  - Nephrology Referral  - EKG 12-Lead - [place], RETROPERITNL ABD,  LTD - Urinalysis, Routine w reflex microscopic - Microalbumin / creatinine urine ratio - HM DIABETES FOOT EXAM - LOW EXTREMITY NEUR EXAM DOCUM - PTH, intact and calcium - Hemoglobin A1c - Insulin, random  5. Vitamin D deficiency  - VITAMIN D 25 Hydroxy   6. BPH with obstruction/lower urinary tract symptoms  - PSA  7. Screening for colorectal cancer  - POC Hemoccult Bld/Stl   8. Prostate cancer screening  - PSA  9. Screening for ischemic heart disease  - EKG 12-Lead  10. Former smoker  - EKG 12-Lead - [place], RETROPERITNL ABD,  LTD  11. Screening for AAA (aortic abdominal aneurysm)  - [place], RETROPERITNL ABD,  LTD  12. Medication management  - Urinalysis, Routine w reflex microscopic - Microalbumin / creatinine urine ratio - CBC with Differential/Platelet - COMPLETE METABOLIC PANEL WITH GFR - Magnesium - Lipid panel - TSH - Hemoglobin A1c - Insulin, random - VITAMIN D 25 Hydroxy          Patient was counseled in prudent diet, weight control to achieve/maintain BMI less than 25, BP monitoring, regular exercise and medications as discussed.  Discussed med effects and SE's. Routine  screening labs and tests as requested with regular follow-up as recommended. Over 40 minutes of exam, counseling, chart review and high complex critical decision making was performed   Kirtland Bouchard, MD

## 2019-12-15 NOTE — Patient Instructions (Signed)

## 2019-12-16 ENCOUNTER — Other Ambulatory Visit: Payer: Self-pay

## 2019-12-16 ENCOUNTER — Ambulatory Visit: Payer: Medicare Other | Admitting: Internal Medicine

## 2019-12-16 VITALS — BP 110/62 | HR 80 | Temp 97.0°F | Resp 16 | Ht 71.0 in | Wt 215.6 lb

## 2019-12-16 DIAGNOSIS — I10 Essential (primary) hypertension: Secondary | ICD-10-CM

## 2019-12-16 DIAGNOSIS — Z0000 Encounter for general adult medical examination without abnormal findings: Secondary | ICD-10-CM | POA: Diagnosis not present

## 2019-12-16 DIAGNOSIS — E1169 Type 2 diabetes mellitus with other specified complication: Secondary | ICD-10-CM

## 2019-12-16 DIAGNOSIS — Z136 Encounter for screening for cardiovascular disorders: Secondary | ICD-10-CM | POA: Diagnosis not present

## 2019-12-16 DIAGNOSIS — E559 Vitamin D deficiency, unspecified: Secondary | ICD-10-CM

## 2019-12-16 DIAGNOSIS — Z125 Encounter for screening for malignant neoplasm of prostate: Secondary | ICD-10-CM

## 2019-12-16 DIAGNOSIS — Z0001 Encounter for general adult medical examination with abnormal findings: Secondary | ICD-10-CM

## 2019-12-16 DIAGNOSIS — Z87891 Personal history of nicotine dependence: Secondary | ICD-10-CM

## 2019-12-16 DIAGNOSIS — I700 Atherosclerosis of aorta: Secondary | ICD-10-CM

## 2019-12-16 DIAGNOSIS — E1122 Type 2 diabetes mellitus with diabetic chronic kidney disease: Secondary | ICD-10-CM

## 2019-12-16 DIAGNOSIS — N138 Other obstructive and reflux uropathy: Secondary | ICD-10-CM

## 2019-12-16 DIAGNOSIS — Z79899 Other long term (current) drug therapy: Secondary | ICD-10-CM

## 2019-12-16 DIAGNOSIS — Z1211 Encounter for screening for malignant neoplasm of colon: Secondary | ICD-10-CM

## 2019-12-17 LAB — URINALYSIS, ROUTINE W REFLEX MICROSCOPIC
Bilirubin Urine: NEGATIVE
Hgb urine dipstick: NEGATIVE
Ketones, ur: NEGATIVE
Leukocytes,Ua: NEGATIVE
Nitrite: NEGATIVE
Protein, ur: NEGATIVE
Specific Gravity, Urine: 1.027 (ref 1.001–1.03)
pH: <=5 (ref 5.0–8.0)

## 2019-12-17 LAB — LIPID PANEL
Cholesterol: 104 mg/dL (ref <200)
HDL: 27 mg/dL — ABNORMAL LOW (ref >=40)
LDL Cholesterol (Calc): 47 mg/dL (calc)
Non-HDL Cholesterol (Calc): 77 mg/dL (calc) (ref <130)
Total CHOL/HDL Ratio: 3.9 (calc) (ref <5.0)
Triglycerides: 259 mg/dL — ABNORMAL HIGH (ref <150)

## 2019-12-17 LAB — INSULIN, RANDOM: Insulin: 69.8 u[IU]/mL — ABNORMAL HIGH

## 2019-12-17 LAB — MAGNESIUM: Magnesium: 1.9 mg/dL (ref 1.5–2.5)

## 2019-12-17 LAB — TSH: TSH: 2.07 mIU/L (ref 0.40–4.50)

## 2019-12-17 LAB — CBC WITH DIFFERENTIAL/PLATELET
Absolute Monocytes: 336 cells/uL (ref 200–950)
Basophils Absolute: 49 cells/uL (ref 0–200)
Basophils Relative: 0.7 %
Eosinophils Absolute: 189 cells/uL (ref 15–500)
Eosinophils Relative: 2.7 %
HCT: 38.7 % (ref 38.5–50.0)
Hemoglobin: 13.2 g/dL (ref 13.2–17.1)
Lymphs Abs: 2128 cells/uL (ref 850–3900)
MCH: 31.2 pg (ref 27.0–33.0)
MCHC: 34.1 g/dL (ref 32.0–36.0)
MCV: 91.5 fL (ref 80.0–100.0)
MPV: 9.6 fL (ref 7.5–12.5)
Monocytes Relative: 4.8 %
Neutro Abs: 4298 cells/uL (ref 1500–7800)
Neutrophils Relative %: 61.4 %
Platelets: 134 10*3/uL — ABNORMAL LOW (ref 140–400)
RBC: 4.23 10*6/uL (ref 4.20–5.80)
RDW: 13.1 % (ref 11.0–15.0)
Total Lymphocyte: 30.4 %
WBC: 7 10*3/uL (ref 3.8–10.8)

## 2019-12-17 LAB — COMPLETE METABOLIC PANEL WITH GFR
AG Ratio: 2.1 (calc) (ref 1.0–2.5)
ALT: 16 U/L (ref 9–46)
AST: 13 U/L (ref 10–35)
Albumin: 4.4 g/dL (ref 3.6–5.1)
Alkaline phosphatase (APISO): 46 U/L (ref 35–144)
BUN/Creatinine Ratio: 19 (calc) (ref 6–22)
BUN: 29 mg/dL — ABNORMAL HIGH (ref 7–25)
CO2: 24 mmol/L (ref 20–32)
Calcium: 9.7 mg/dL (ref 8.6–10.3)
Chloride: 108 mmol/L (ref 98–110)
Creat: 1.54 mg/dL — ABNORMAL HIGH (ref 0.70–1.18)
GFR, Est African American: 49 mL/min/{1.73_m2} — ABNORMAL LOW (ref >=60)
GFR, Est Non African American: 42 mL/min/{1.73_m2} — ABNORMAL LOW (ref >=60)
Globulin: 2.1 g/dL (calc) (ref 1.9–3.7)
Glucose, Bld: 137 mg/dL — ABNORMAL HIGH (ref 65–99)
Potassium: 4 mmol/L (ref 3.5–5.3)
Sodium: 141 mmol/L (ref 135–146)
Total Bilirubin: 0.7 mg/dL (ref 0.2–1.2)
Total Protein: 6.5 g/dL (ref 6.1–8.1)

## 2019-12-17 LAB — PTH, INTACT AND CALCIUM
Calcium: 9.7 mg/dL (ref 8.6–10.3)
PTH: 26 pg/mL (ref 14–64)

## 2019-12-17 LAB — MICROALBUMIN / CREATININE URINE RATIO
Creatinine, Urine: 201 mg/dL (ref 20–320)
Microalb Creat Ratio: 2 mcg/mg creat (ref <30)
Microalb, Ur: 0.5 mg/dL

## 2019-12-17 LAB — HEMOGLOBIN A1C
Hgb A1c MFr Bld: 7.7 % of total Hgb — ABNORMAL HIGH (ref <5.7)
Mean Plasma Glucose: 174 (calc)
eAG (mmol/L): 9.7 (calc)

## 2019-12-17 LAB — PSA: PSA: 0.25 ng/mL (ref <=4.0)

## 2019-12-17 LAB — VITAMIN D 25 HYDROXY (VIT D DEFICIENCY, FRACTURES): Vit D, 25-Hydroxy: 57 ng/mL (ref 30–100)

## 2019-12-17 NOTE — Progress Notes (Signed)
==========================================================  -    PSA - very Low - Great  ==========================================================  -  Kidney functions - Still Stage 3b and  Stable ==========================================================  -  Total Chol = 104 and LDL Chol =47 - Both  Excellent   - Very low risk for Heart Attack  / Stroke ==========================================================  - A1c is a little better - Down from 8.9% to now 7.7% , But still too high  (Ideal or goal is less than 6.0% )    - Avoid Sweets, Candy & White Stuff   - Rice, Potatoes, Breads &  Pasta ==========================================================  -  Vitamin D = 57 - OK  ==========================================================  -  All Else - CBC - Kidneys - Electrolytes - Liver - Magnesium & Thyroid    - all  Normal / OK ==========================================================

## 2019-12-22 ENCOUNTER — Other Ambulatory Visit: Payer: Self-pay | Admitting: Internal Medicine

## 2020-01-31 ENCOUNTER — Other Ambulatory Visit: Payer: Self-pay | Admitting: Internal Medicine

## 2020-01-31 DIAGNOSIS — N182 Chronic kidney disease, stage 2 (mild): Secondary | ICD-10-CM

## 2020-03-18 ENCOUNTER — Other Ambulatory Visit: Payer: Self-pay | Admitting: Internal Medicine

## 2020-03-20 ENCOUNTER — Emergency Department (HOSPITAL_COMMUNITY): Payer: Medicare Other

## 2020-03-20 ENCOUNTER — Emergency Department (HOSPITAL_COMMUNITY)
Admission: EM | Admit: 2020-03-20 | Discharge: 2020-03-20 | Disposition: A | Discharge status: Home / Self Care | Payer: Medicare Other | Attending: Emergency Medicine | Admitting: Emergency Medicine

## 2020-03-20 ENCOUNTER — Encounter (HOSPITAL_COMMUNITY): Payer: Self-pay | Admitting: Emergency Medicine

## 2020-03-20 DIAGNOSIS — N183 Chronic kidney disease, stage 3 unspecified: Secondary | ICD-10-CM | POA: Insufficient documentation

## 2020-03-20 DIAGNOSIS — R42 Dizziness and giddiness: Secondary | ICD-10-CM | POA: Diagnosis not present

## 2020-03-20 DIAGNOSIS — R079 Chest pain, unspecified: Secondary | ICD-10-CM | POA: Diagnosis not present

## 2020-03-20 DIAGNOSIS — Z87891 Personal history of nicotine dependence: Secondary | ICD-10-CM | POA: Insufficient documentation

## 2020-03-20 DIAGNOSIS — R112 Nausea with vomiting, unspecified: Secondary | ICD-10-CM | POA: Diagnosis not present

## 2020-03-20 DIAGNOSIS — Z7982 Long term (current) use of aspirin: Secondary | ICD-10-CM | POA: Insufficient documentation

## 2020-03-20 DIAGNOSIS — R1013 Epigastric pain: Secondary | ICD-10-CM | POA: Diagnosis not present

## 2020-03-20 DIAGNOSIS — I129 Hypertensive chronic kidney disease with stage 1 through stage 4 chronic kidney disease, or unspecified chronic kidney disease: Secondary | ICD-10-CM | POA: Diagnosis not present

## 2020-03-20 DIAGNOSIS — Z794 Long term (current) use of insulin: Secondary | ICD-10-CM | POA: Diagnosis not present

## 2020-03-20 DIAGNOSIS — R197 Diarrhea, unspecified: Secondary | ICD-10-CM | POA: Diagnosis not present

## 2020-03-20 DIAGNOSIS — Z20822 Contact with and (suspected) exposure to covid-19: Secondary | ICD-10-CM | POA: Diagnosis not present

## 2020-03-20 DIAGNOSIS — Z86711 Personal history of pulmonary embolism: Secondary | ICD-10-CM | POA: Insufficient documentation

## 2020-03-20 DIAGNOSIS — R55 Syncope and collapse: Secondary | ICD-10-CM | POA: Diagnosis not present

## 2020-03-20 DIAGNOSIS — E1122 Type 2 diabetes mellitus with diabetic chronic kidney disease: Secondary | ICD-10-CM | POA: Diagnosis not present

## 2020-03-20 LAB — COMPREHENSIVE METABOLIC PANEL
ALT: 23 U/L (ref 0–44)
AST: 18 U/L (ref 15–41)
Albumin: 4.1 g/dL (ref 3.5–5.0)
Alkaline Phosphatase: 51 U/L (ref 38–126)
Anion gap: 13 (ref 5–15)
BUN: 30 mg/dL — ABNORMAL HIGH (ref 8–23)
CO2: 17 mmol/L — ABNORMAL LOW (ref 22–32)
Calcium: 9.5 mg/dL (ref 8.9–10.3)
Chloride: 108 mmol/L (ref 98–111)
Creatinine, Ser: 1.47 mg/dL — ABNORMAL HIGH (ref 0.61–1.24)
GFR, Estimated: 48 mL/min — ABNORMAL LOW (ref >60)
Glucose, Bld: 335 mg/dL — ABNORMAL HIGH (ref 70–99)
Potassium: 4.1 mmol/L (ref 3.5–5.1)
Sodium: 138 mmol/L (ref 135–145)
Total Bilirubin: 0.8 mg/dL (ref 0.3–1.2)
Total Protein: 6.7 g/dL (ref 6.5–8.1)

## 2020-03-20 LAB — CBC
HCT: 41.7 % (ref 39.0–52.0)
Hemoglobin: 13.8 g/dL (ref 13.0–17.0)
MCH: 31.4 pg (ref 26.0–34.0)
MCHC: 33.1 g/dL (ref 30.0–36.0)
MCV: 94.8 fL (ref 80.0–100.0)
Platelets: 106 10*3/uL — ABNORMAL LOW (ref 150–400)
RBC: 4.4 MIL/uL (ref 4.22–5.81)
RDW: 12.6 % (ref 11.5–15.5)
WBC: 11.7 10*3/uL — ABNORMAL HIGH (ref 4.0–10.5)
nRBC: 0 % (ref 0.0–0.2)

## 2020-03-20 LAB — URINALYSIS, ROUTINE W REFLEX MICROSCOPIC
Bacteria, UA: NONE SEEN
Bilirubin Urine: NEGATIVE
Glucose, UA: >=500 mg/dL — AB
Hgb urine dipstick: NEGATIVE
Ketones, ur: 5 mg/dL — AB
Leukocytes,Ua: NEGATIVE
Nitrite: NEGATIVE
Protein, ur: NEGATIVE mg/dL
Specific Gravity, Urine: 1.027 (ref 1.005–1.030)
pH: 5 (ref 5.0–8.0)

## 2020-03-20 LAB — MAGNESIUM: Magnesium: 1.9 mg/dL (ref 1.7–2.4)

## 2020-03-20 LAB — CBG MONITORING, ED
Glucose-Capillary: 358 mg/dL — ABNORMAL HIGH (ref 70–99)
Glucose-Capillary: 203 mg/dL — ABNORMAL HIGH (ref 70–99)

## 2020-03-20 LAB — RESP PANEL BY RT-PCR (FLU A&B, COVID) ARPGX2
Influenza A by PCR: NEGATIVE
Influenza B by PCR: NEGATIVE
SARS Coronavirus 2 by RT PCR: NEGATIVE

## 2020-03-20 LAB — TROPONIN I (HIGH SENSITIVITY): Troponin I (High Sensitivity): 5 ng/L (ref <18)

## 2020-03-20 LAB — LIPASE, BLOOD: Lipase: 103 U/L — ABNORMAL HIGH (ref 11–51)

## 2020-03-20 IMAGING — CR DG CHEST 2V
2 series · 2 of 2 positions shown · non-contrast
Comparison: PA and lateral chest [DATE]

CLINICAL DATA: Dizziness and syncope last night.

EXAM:
CHEST - 2 VIEW

[w chest lat]
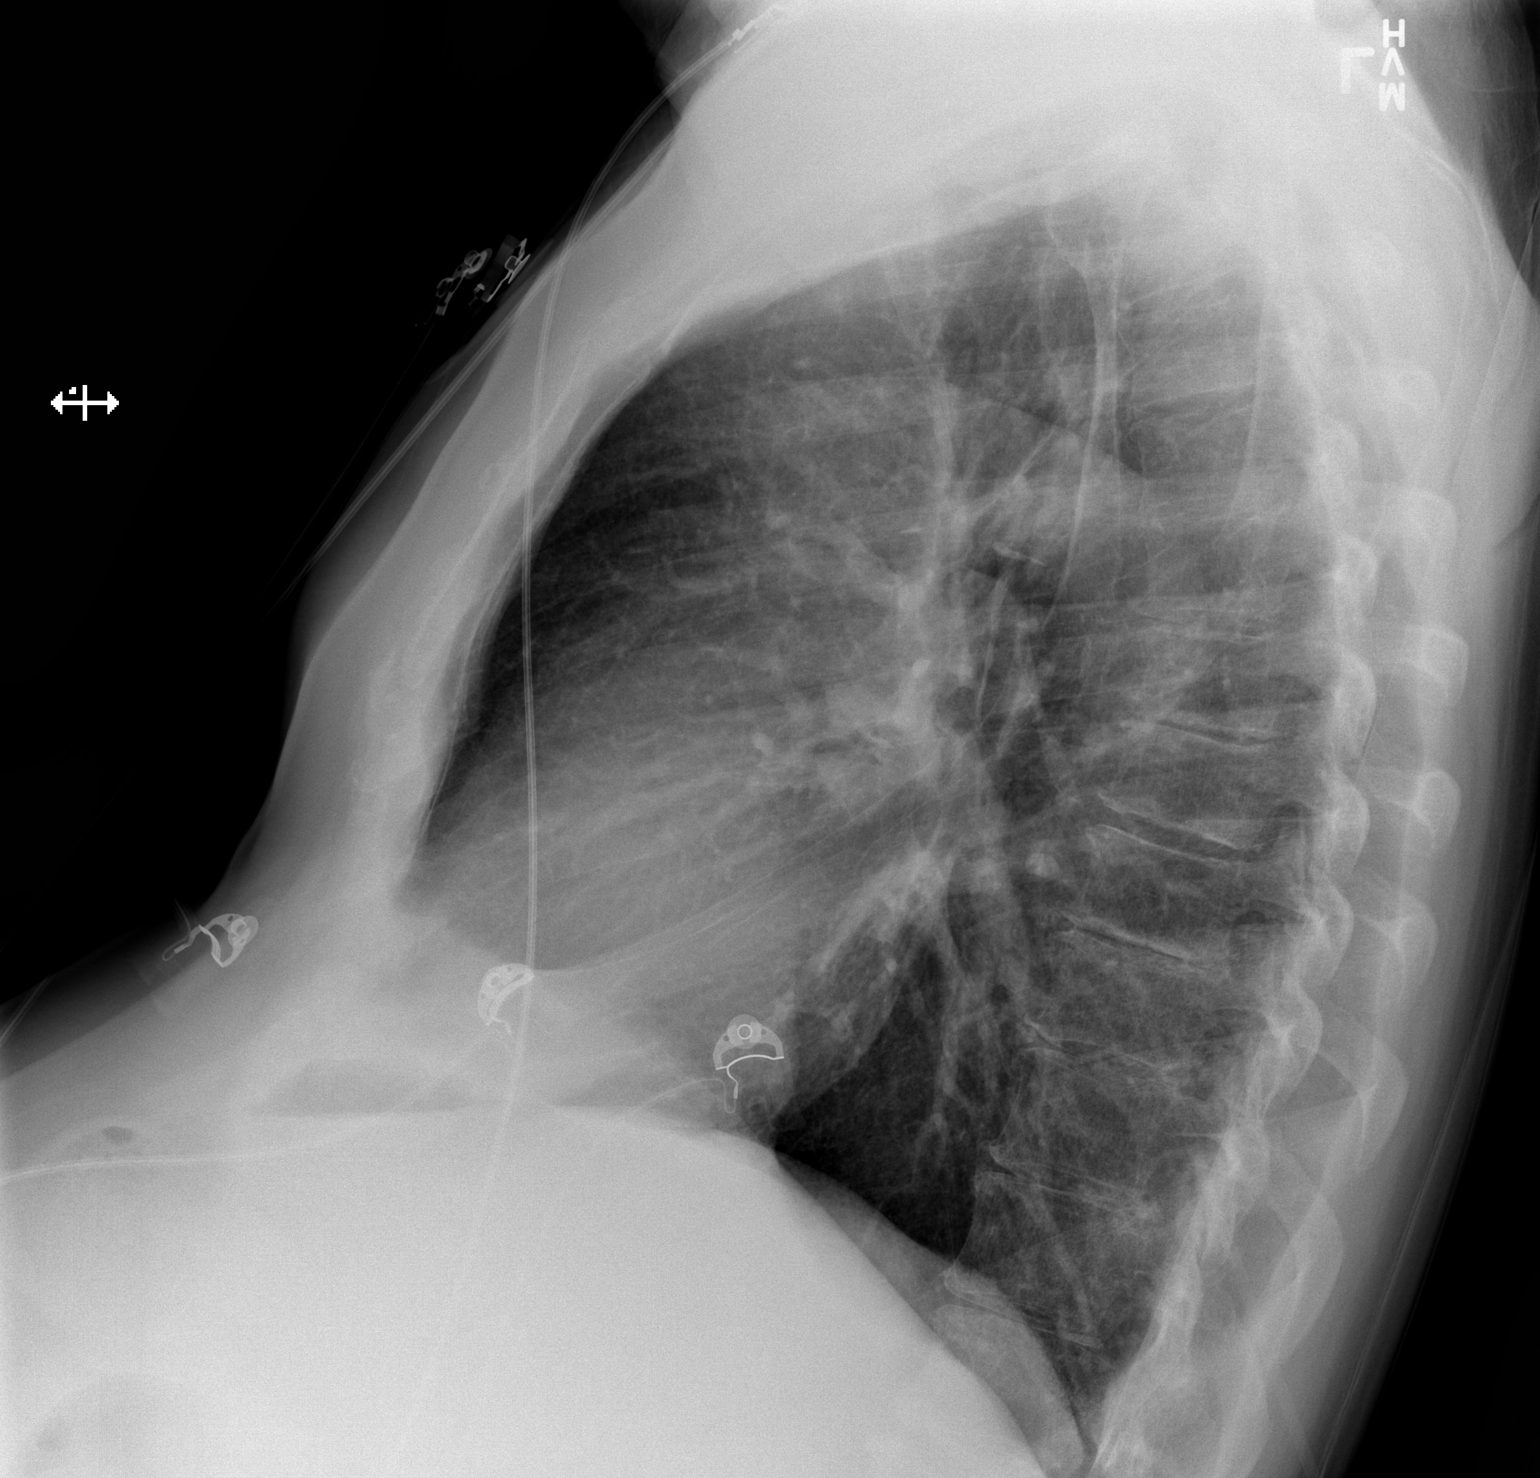

[x chest ap]
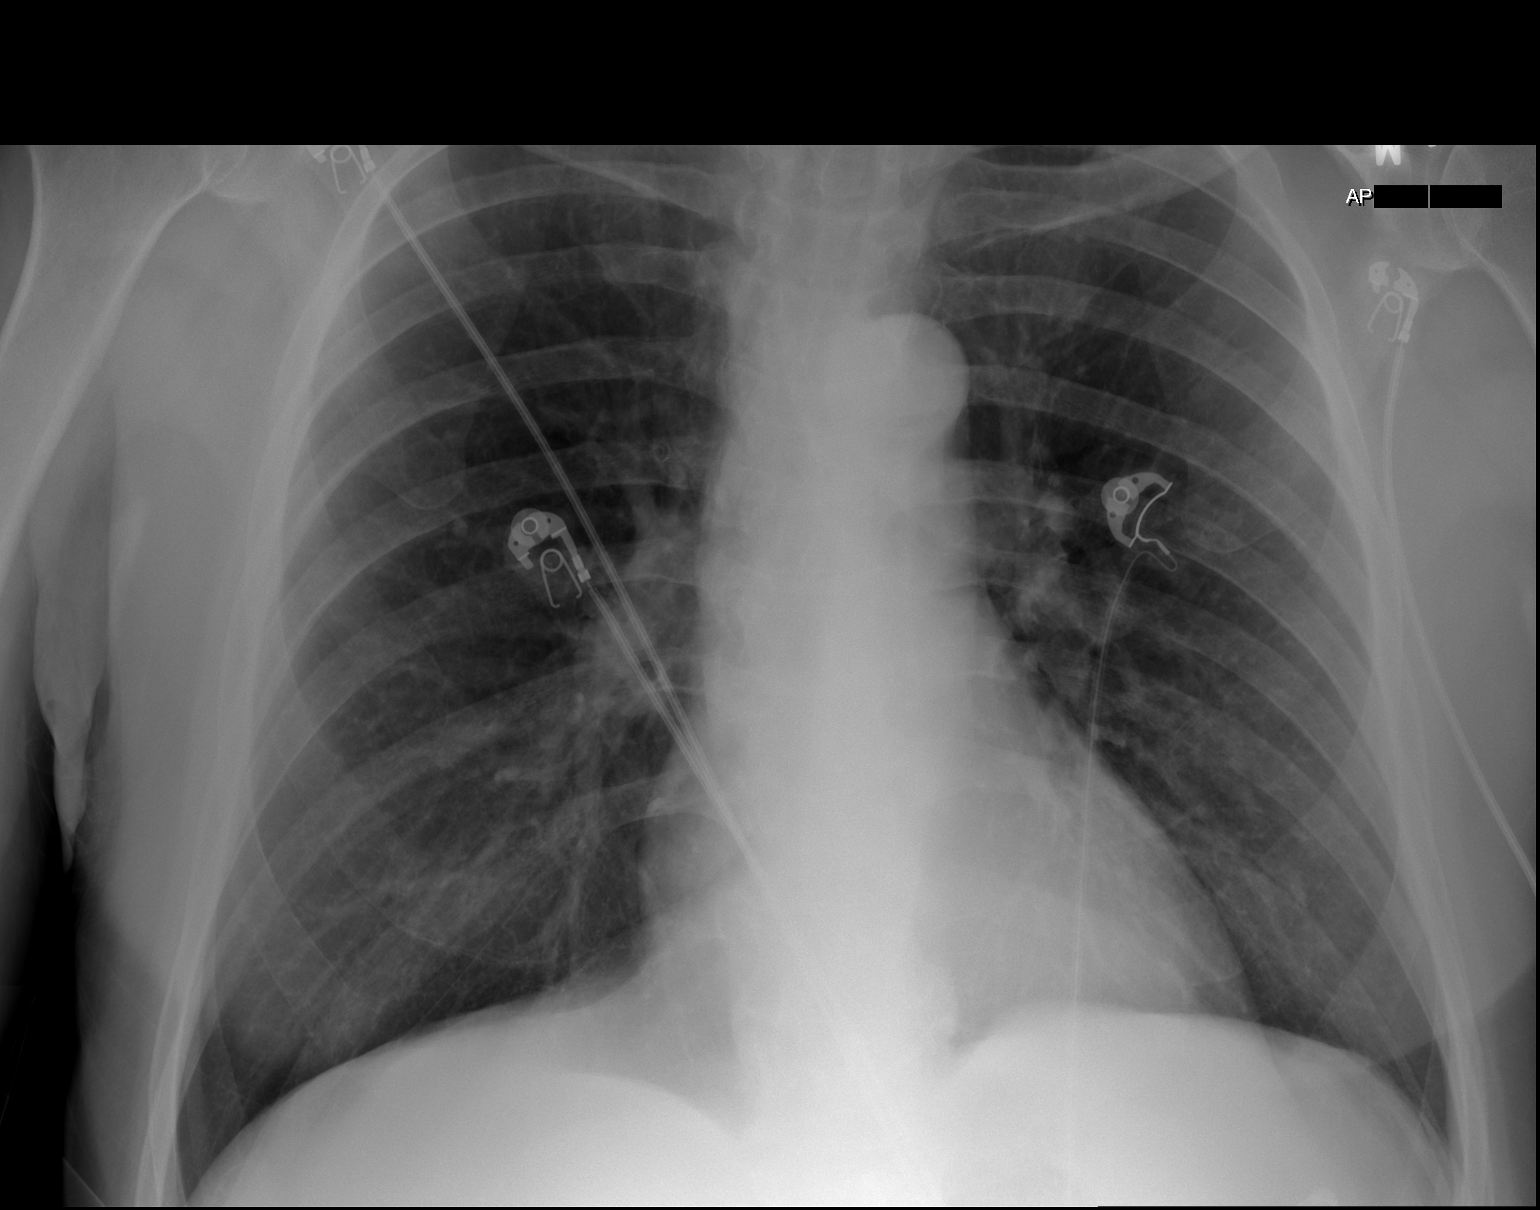

[2 of 2 positions shown; findings below may reference images not displayed]

FINDINGS: Lungs clear. Heart size normal. No pneumothorax or pleural fluid. No
bony abnormality.
IMPRESSION: Negative chest.

## 2020-03-20 IMAGING — CT CT HEAD W/O CM
3 series · 14 of 47 positions shown, 16 images · non-contrast
Comparison: [DATE]

CLINICAL DATA: Syncope, normal neuro examination.

EXAM:
CT HEAD WITHOUT CONTRAST
TECHNIQUE: Contiguous axial images were obtained from the base of the skull
through the vertex without intravenous contrast.

[Series 2: head wo · axial · 0.50mm/px · z∈[-91,+54]mm · 8 of 35 slices shown, 10 images]
[im 3/35  brain]
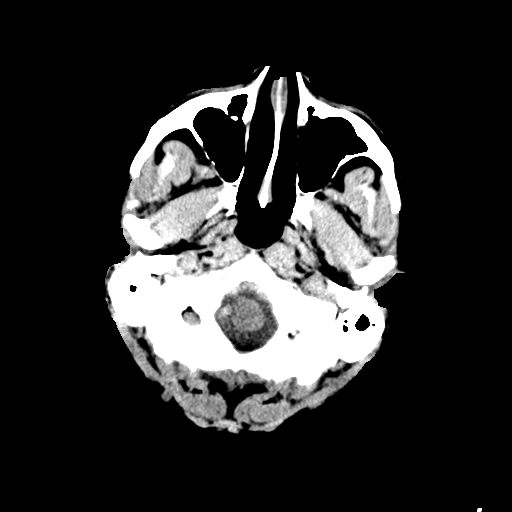
[im 3/35  bone]
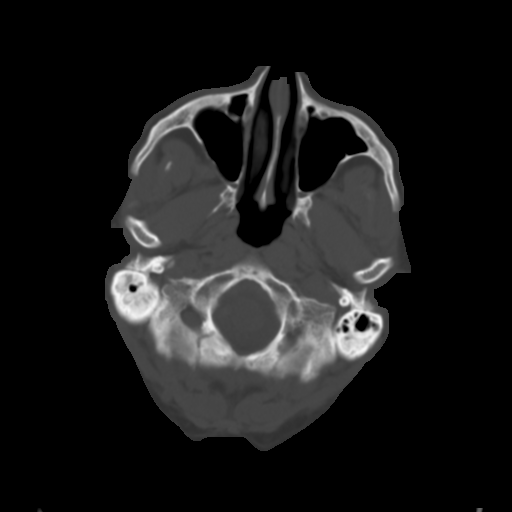
[im 8/35  brain]
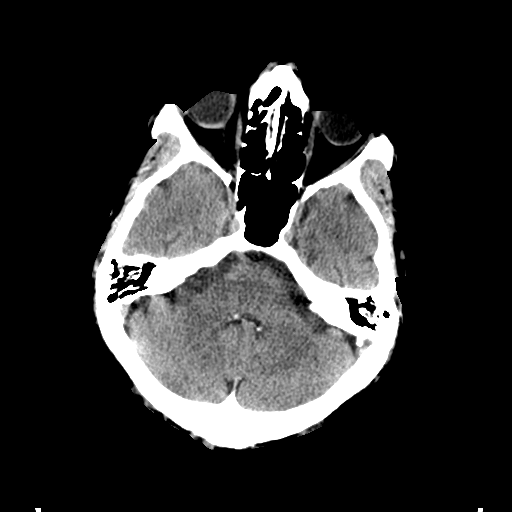
[im 11/35  brain]
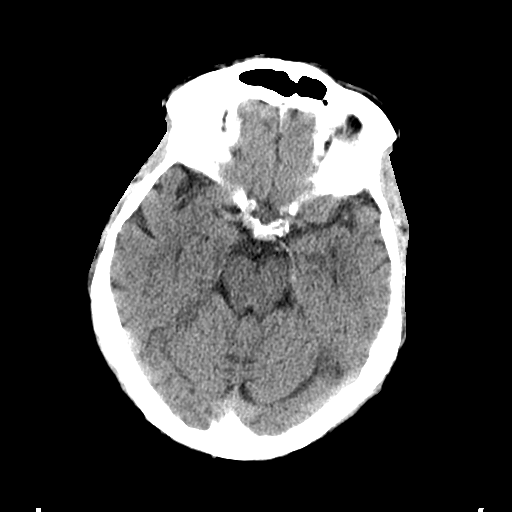
[im 16/35  brain]
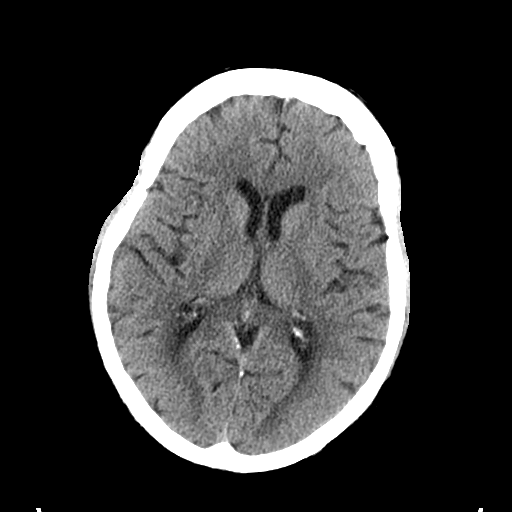
[im 19/35  brain]
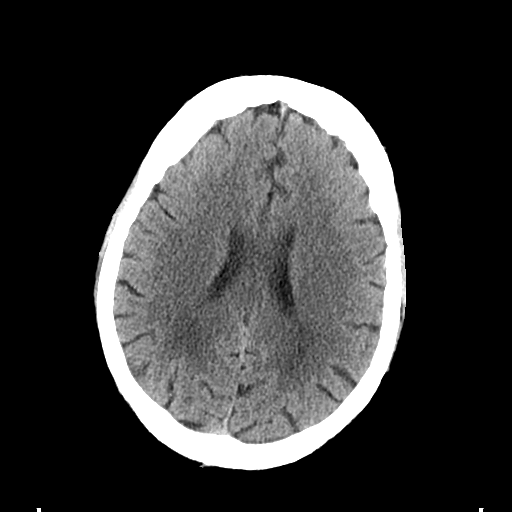
[im 19/35  bone]
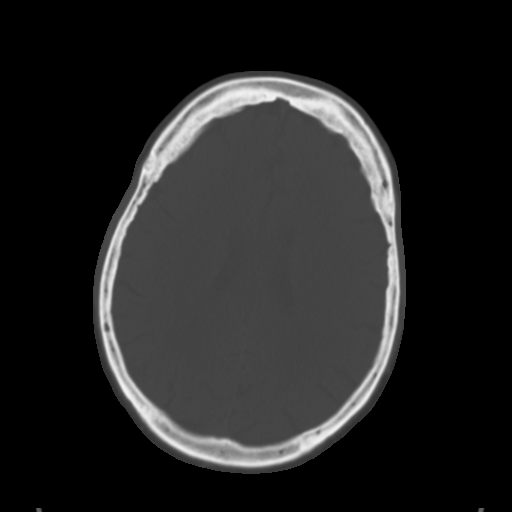
[im 24/35  brain]
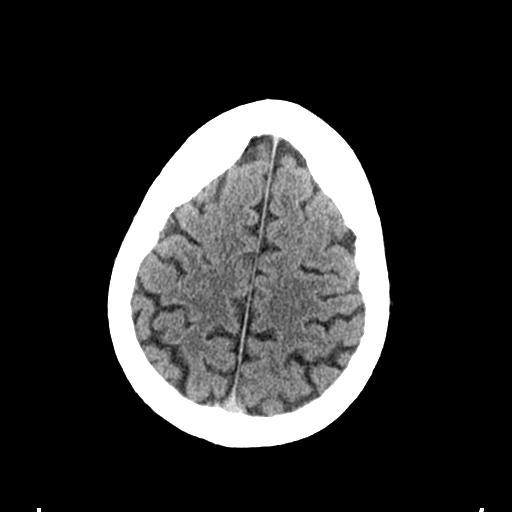
[im 27/35  brain]
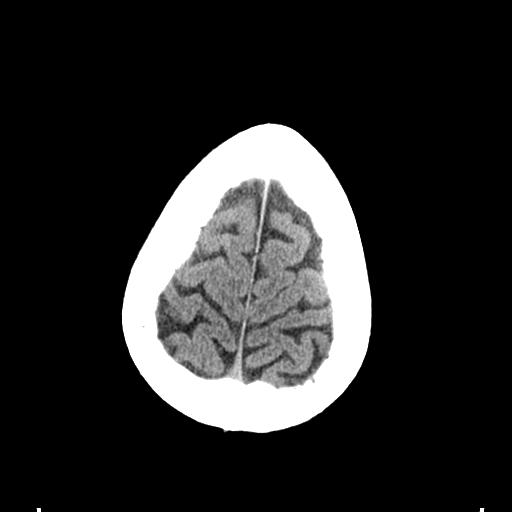
[im 32/35  brain]
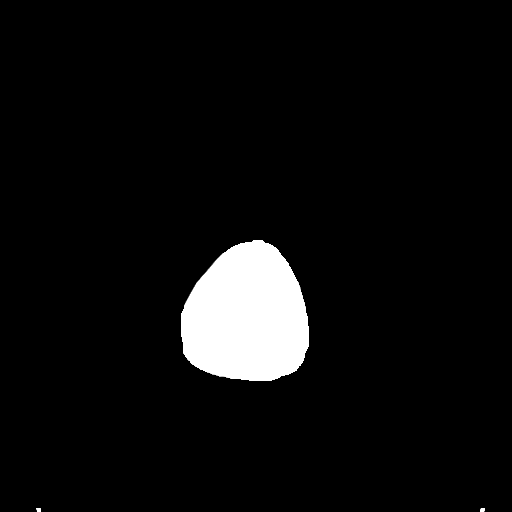

[Series 4: coronal soft tissue · coronal · 0.43mm/px · 3 of 69 slices shown]
[im 23/69  brain]
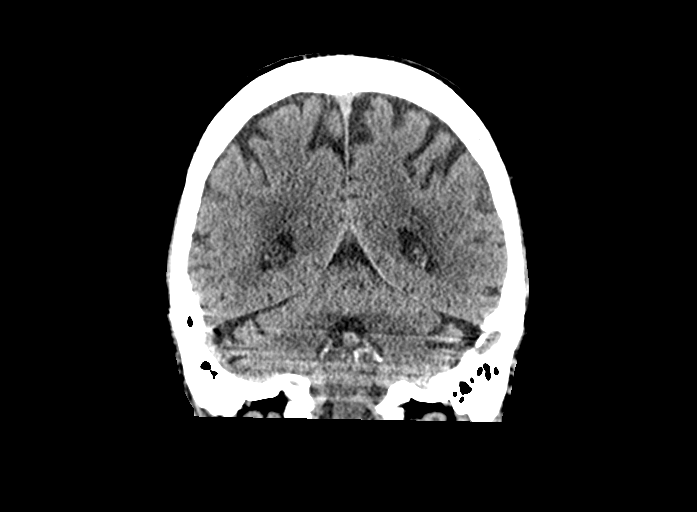
[im 31/69  brain]
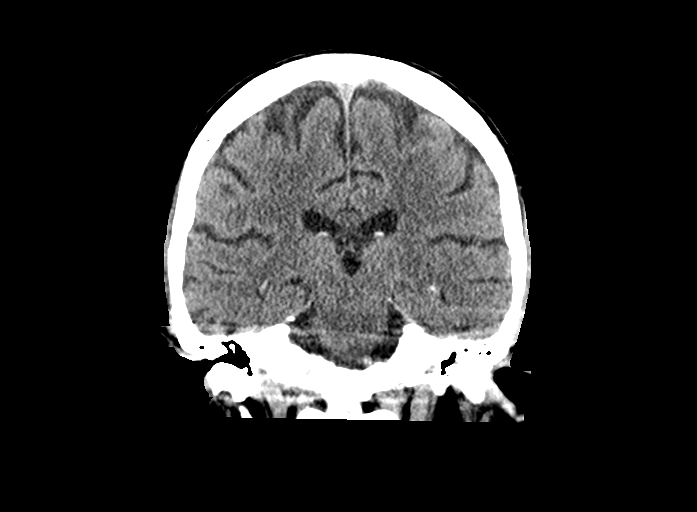
[im 38/69  brain]
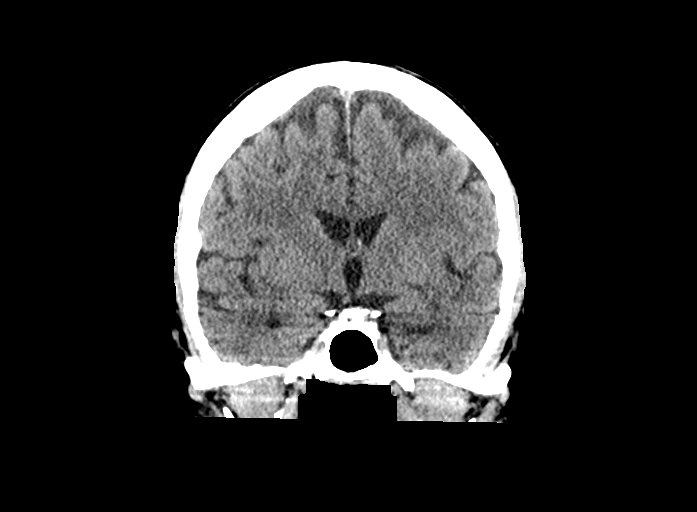

[Series 5: sagittal soft tissue · sagittal · 0.38mm/px · 3 of 55 slices shown]
[im 19/55  brain]
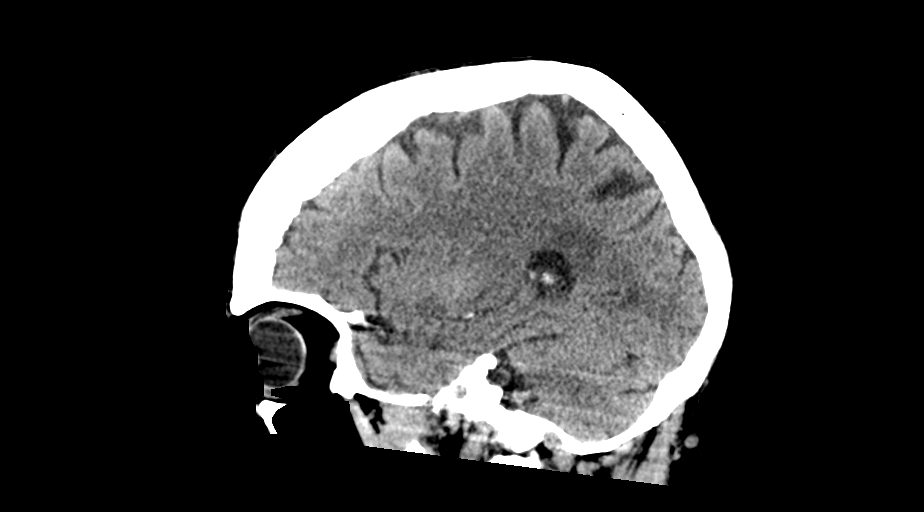
[im 28/55  brain]
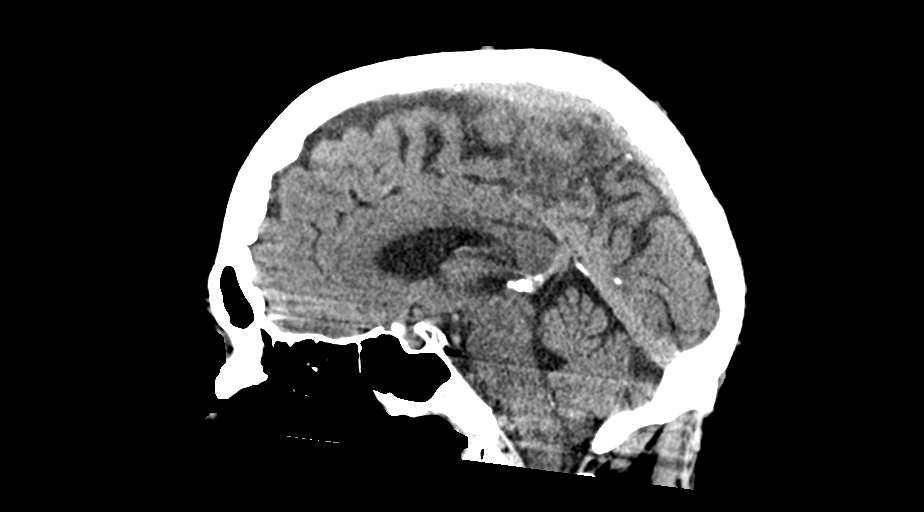
[im 37/55  brain]
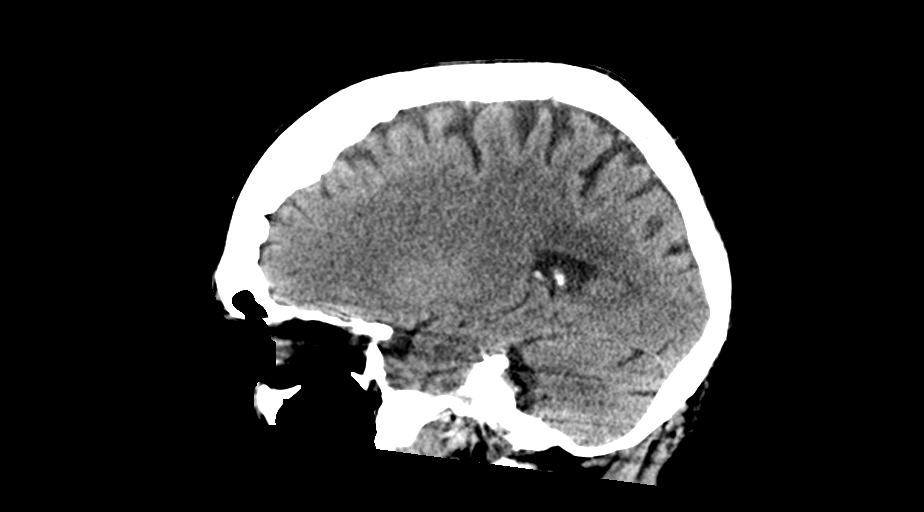

[14 of 47 positions shown; findings below may reference images not displayed]

FINDINGS: Brain: No evidence of acute infarction, hemorrhage, hydrocephalus,
extra-axial collection or mass lesion/mass effect. Mild atrophy and
chronic microvascular ischemic changes.

Vascular: No hyperdense vessel or unexpected calcification.

Skull: Normal. Negative for fracture or focal lesion.

Sinuses/Orbits: Visualized paranasal sinuses and orbits are
unremarkable.

Other: None.
IMPRESSION: 1. No acute intracranial abnormality.
2. Mild atrophy and chronic microvascular ischemic changes.

## 2020-03-20 IMAGING — US US ABDOMEN LIMITED
1 series · 13 of 25 positions shown · non-contrast
Comparison: Noncontrast CT [DATE]

CLINICAL DATA: Epigastric pain.  Intermittent pain for years.

EXAM:
ULTRASOUND ABDOMEN LIMITED RIGHT UPPER QUADRANT

[Series 1: us abdomen limited · 13 of 81 slices shown]
[im 1/81]
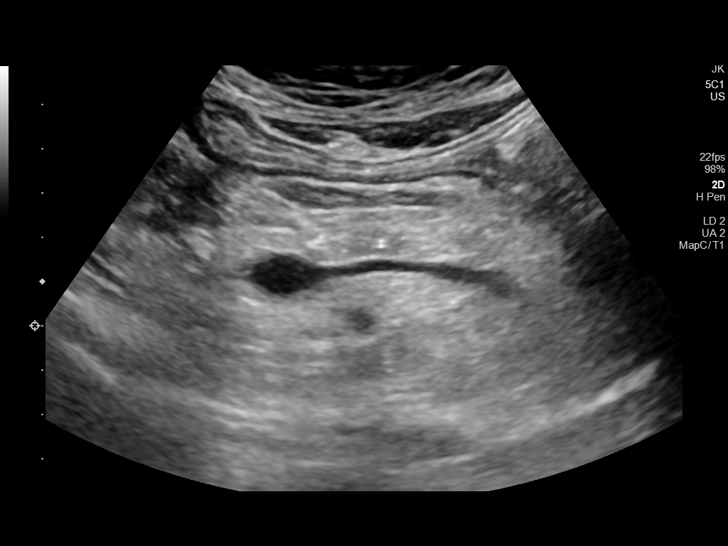
[im 7/81]
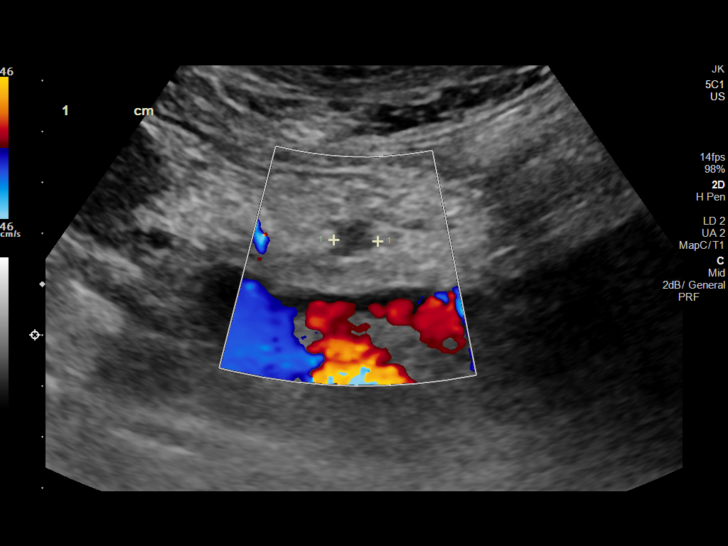
[im 14/81]
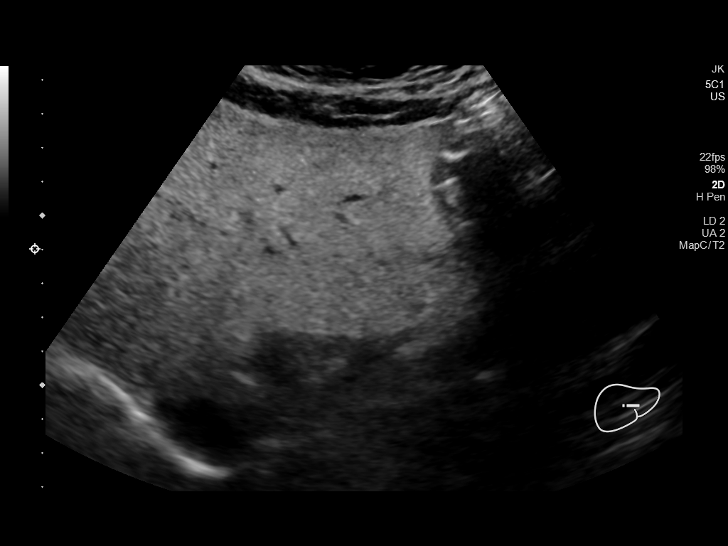
[im 21/81]
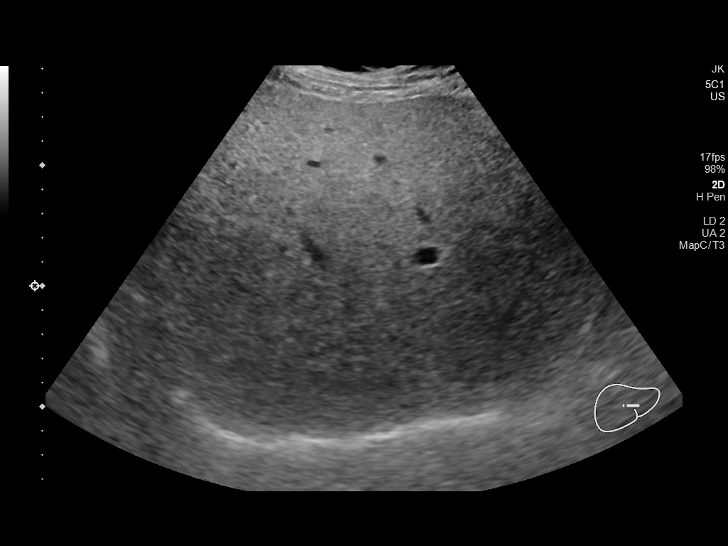
[im 27/81]
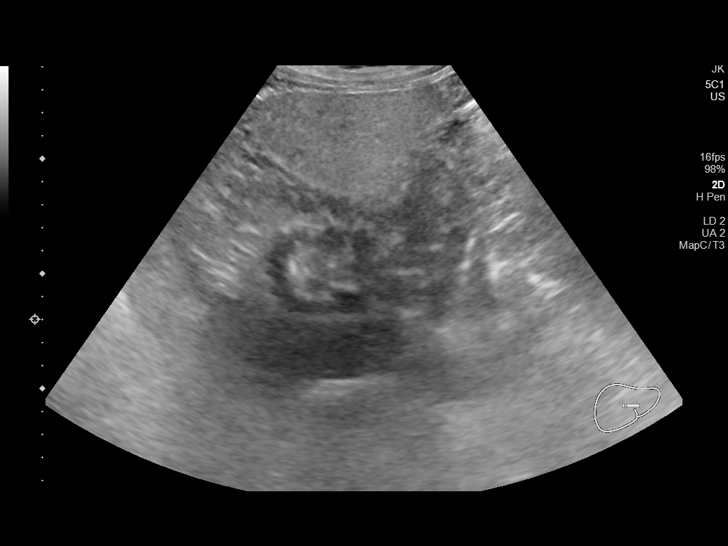
[im 34/81]
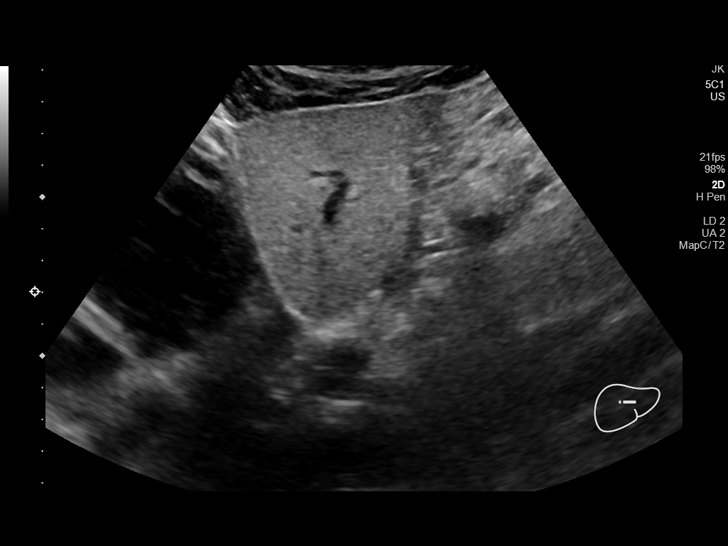
[im 41/81]
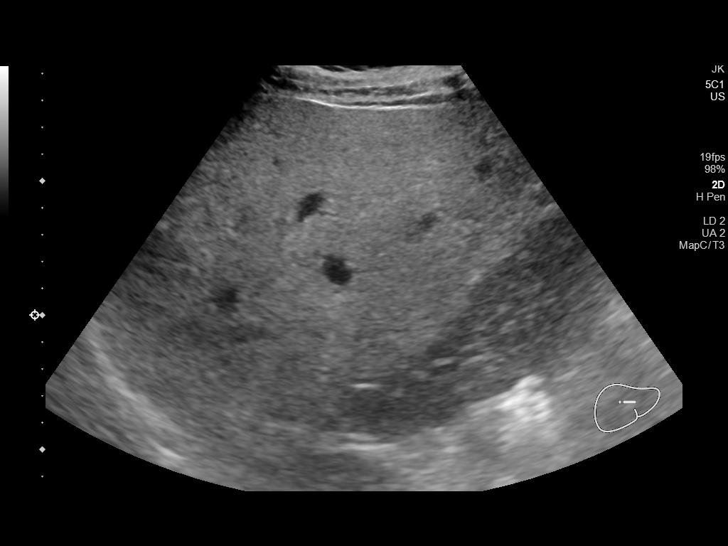
[im 47/81]
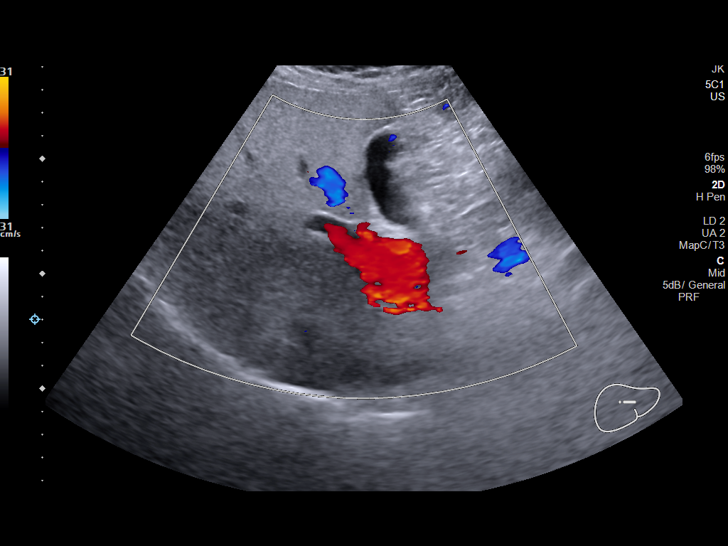
[im 54/81]
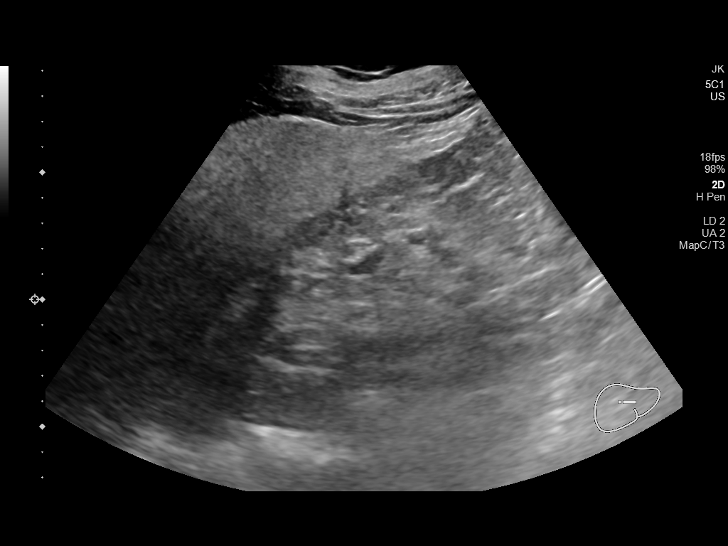
[im 61/81]
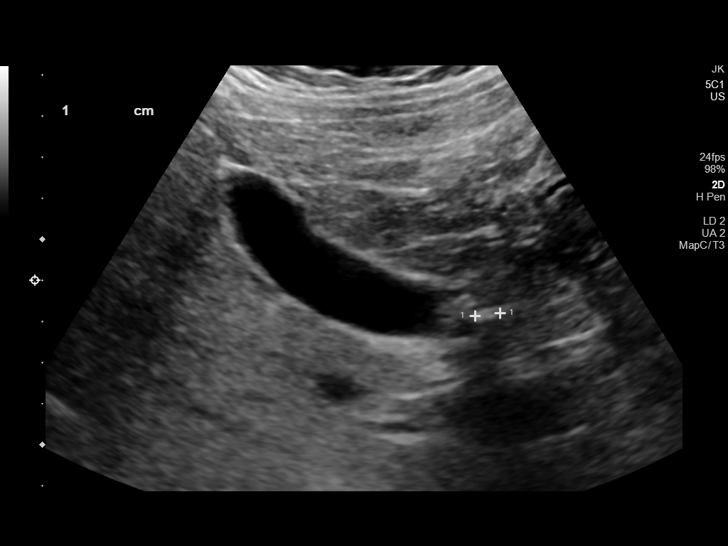
[im 67/81]
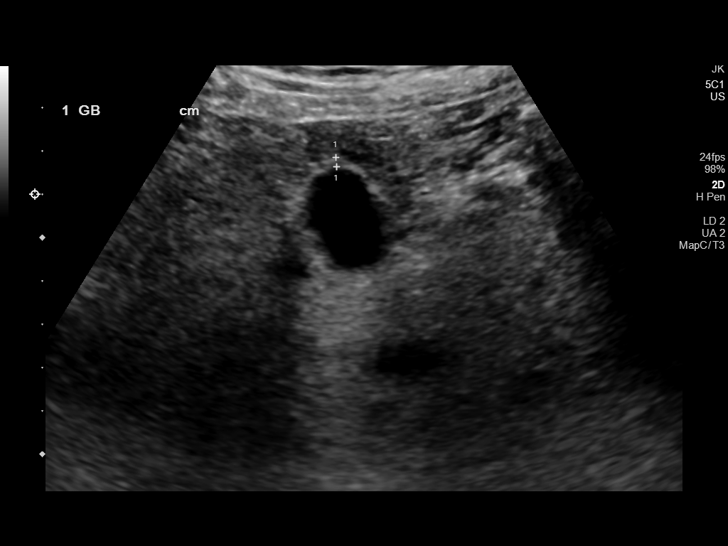
[im 74/81]
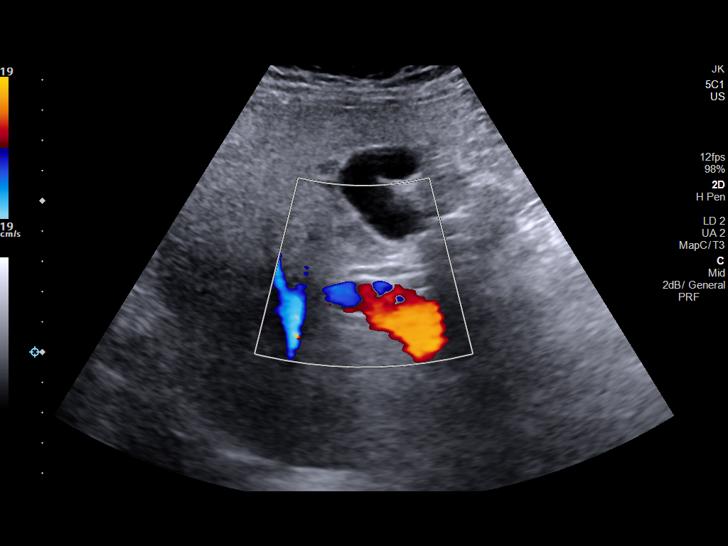
[im 81/81]
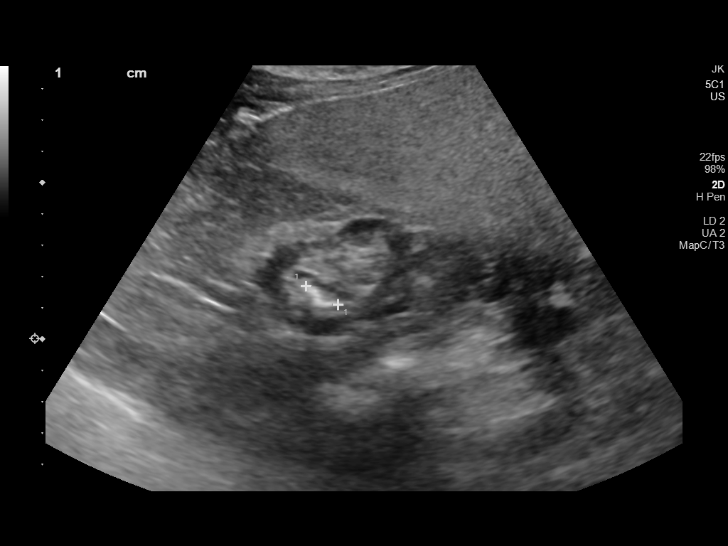

[13 of 25 positions shown; findings below may reference images not displayed]

FINDINGS: Gallbladder:

Partially distended containing multiple mobile intraluminal
gallstones. No gallbladder wall thickening or pericholecystic fluid.
No sonographic Murphy sign noted by sonographer.

Common bile duct:

Diameter: 5 mm, normal

Liver:

Heterogeneous and diffusely increased in parenchymal echogenicity.
No discrete focal lesion is seen. Portal vein is patent on color
Doppler imaging with normal direction of blood flow towards the
liver.

Other: Incidental right renal cortical thickening and nonobstructing
stones, as seen on prior CT. Subcentimeter hypoechoic structure in
the head/proximal body may represent a cyst, however has no
increased through transmission, possibly due to small size. No right
upper quadrant ascites.
IMPRESSION: 1. Gallstones without sonographic findings of acute cholecystitis.
No biliary dilatation.
2. Diffusely increased and heterogeneous hepatic parenchyma typical
of steatosis. No discrete focal lesion.
3. Incidental note of subcentimeter hypoechoic lesion in the
pancreatic head/proximal body. While this may represent an
incidental cyst, this is incompletely characterized by ultrasound.
Recommend further evaluation with pancreatic protocol MRI on an
elective basis. MRI should only be pursued if patient is able to
tolerate breath hold technique. If unable to tolerate breath hold,
pancreatic protocol CT could be considered.

## 2020-03-20 NOTE — ED Notes (Signed)
Ultrasound at bedside

## 2020-03-20 NOTE — Discharge Instructions (Addendum)
You were evaluated in the emergency department today for your syncope episode, when you passed out.  Your physical exam, vital signs, blood work, EKG, head CT, and abdominal ultrasound were all very reassuring.  I suspect that you passed out secondary to effort associated with vomiting and diarrhea (known as vasovagal syncope).  Recommend you follow-up closely with your primary care doctor.   Additionally below is the contact information for the cardiologist in [place]; recommend you follow-up with them as well.  You may call their office to schedule an appointment for emergency room follow-up for syncope.  There was a finding on your abdominal ultrasound of a small lesion on your pancreas; recommend that you follow-up with your primary care doctor for further imaging outpatient.  The radiologist is recommending pancreatic protocol MRI or pancreatic protocol CT scan for further evaluation of the lesion.  You have tested negative for COVID-19 and influenza A/B.   Return to the emergency department if you develop any chest pain, shortness of breath, palpitations, nausea or vomiting that does not stop, if you pass out again, or if you develop new new severe symptoms.

## 2020-03-20 NOTE — ED Provider Notes (Signed)
Joshua Peterson DEPT Provider Note   CSN: [id number] Arrival date & time: 03/20/20  1034     History No chief complaint on file.   Joshua Peterson is a 79 y.o. male who presents with concern for lightheadedness/unsteadiness on his feet, epigastric pain, nausea/vomiting/diarrhea since last night, with syncopal episode this morning.  Patient states that he passed out while sitting in a chair after episode of emesis, got sweaty and felt his vision "closing in". He woke up only after vomiting on himself again, NBNB emesis. Patient states that he felt so weak that he lowered himself to the floor and lied flat on the ground to avoid falling should he syncopized again, his wife called EMS.  He was administered 700 cc of NS, 4 mg of Zofran in route. Endorses several episodes of watery green diarrhea this morning, resolved after administration of medications by EMS. Denies melena or hematochezia.   He denies fevers or chills at home, endorses diaphoresis this morning, denies congestion, sore throat, chest pain, shortness of breath.  He does endorse sensation of heartburn. Denies alcohol use.  Endorses 2 similar episodes in the past, 1 approximately 10 years ago, the other several years prior to that.   Patient has received 3 doses of COVID-19 vaccination.  Denies known sick contacts.  At time of my initial interview, patient is completely asymptomatic.  States that he feels totally normal at this time, is no longer feeling lightheaded, nauseous, no longer has epigastric pain.  I personally read this patient's medical records.  History of CKD, hyperlipidemia, hypertension, GERD, type 2 diabetes, history of DVT.  HPI     Past Medical History:  Diagnosis Date  . Allergic rhinitis   . Bladder stones   . CKD (chronic kidney disease), stage III ([place])   . Common iliac aneurysm (HCC) right side   followed by vascular-- dr Trula Slade (note in epic)  . Environmental and seasonal  allergies   . External carotid artery stenosis     per last duplex 05-18-2011  right moderate stenosis and left moderate to severe  . Hematuria   . History of adenomatous polyp of colon    tubular adenoma's  . History of bladder stone   . History of DVT in adulthood    08/ 2009 right lower extremity;  2013  left lower extremity  (10-11-2018 per pt no dvt since 2013)  . History of kidney stones   . History of pulmonary embolism    bilatera lung  08/ 2009  (10-11-2018  per pt no pe since 2009)  . History of wheezing    allergies--- prn inhaler  . Hypertension   . Hypogonadism male   . Internal carotid artery stenosis, bilateral     per last duplex 05-18-2011 in epic,   bilateral 40-59% stenosis  (10-11-2018  per pt denies stroke/tia S&S)  . Lower urinary tract symptoms (LUTS)   . Mixed hyperlipidemia   . Nephrolithiasis   . Testosterone deficiency   . Type 2 diabetes mellitus ([place])   . Vitamin D deficiency   . Wears glasses     Patient Active Problem List   Diagnosis Date Noted  . Aortic atherosclerosis ([place]) by CT scan 2018 12/15/2019  . Diabetic polyneuropathy associated with type 2 diabetes mellitus ([place]) 08/12/2019  . BPH with obstruction/lower urinary tract symptoms 10/31/2018  . Type 2 diabetes mellitus with stage 3 chronic kidney disease, without long-term current use of insulin ([place]) 01/10/2018  . Depression 02/02/2017  .  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 Gallstones 06/20/2016  . Thrombocytopenia ([place]) 11/27/2015  . Gastroesophageal reflux disease 01/14/2015  . Medication management 05/05/2013  . Vitamin D deficiency   . Obesity (BMI 30.0-34.9)   . History of DVT of lower extremity 05/18/2011  . CKD stage 3 due to type 2 diabetes mellitus ([place]) 05/18/2011  . Labile hypertension 05/18/2011  . Hyperlipidemia associated with type 2 diabetes mellitus ([place]) 05/18/2011    Past Surgical History:  Procedure Laterality Date  . COLONOSCOPY  last one  10-25-2017  . CYSTOSCOPY WITH LITHOLAPAXY N/A 07/18/2016   Procedure: CYSTOSCOPY WITH LITHOLAPAXY;  Surgeon: Kathie Rhodes, MD;  Location: [facility];  Service: Urology;  Laterality: N/A;  . CYSTOSCOPY WITH LITHOLAPAXY N/A 10/15/2018   Procedure: CYSTOSCOPY WITH LITHOLAPAXY;  Surgeon: Kathie Rhodes, MD;  Location: [facility];  Service: Urology;  Laterality: N/A;  . IVC FILTER INSERTION  11/21/2007  . IVC FILTER REMOVAL  01/31/2008  . 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 RIGHT CHEEK/MOUTH BIOPSY  09/16/2008  (office)   squamous hyperplasia w/ hyperkeratosis  . RIGHT DEEP CERVICAL NODE BIOPSY  01/03/2001   right posterior neck (per path report-- lymphoid hyperplasia w/ florid follicular hyperplasia, no malignancy(  . TONSILLECTOMY  age 75       Family History  Problem Relation Age of Onset  . Alzheimer's disease Mother   . Diabetes Mother   . Prostate cancer Father   . Breast cancer Sister   . Colon cancer Neg Hx   . Rectal cancer Neg Hx   . Stomach cancer Neg Hx   . Esophageal cancer Neg Hx     Social History   Tobacco Use  . Smoking status: Former Smoker    Packs/day: 1.00    Years: 20.00    Pack years: 20.00    Types: Cigarettes    Quit date: 04/16/1991    Years since quitting: 28.9  . Smokeless tobacco: Never Used  Substance Use Topics  . Alcohol use: Yes    Comment: ocassional   . Drug use: Never    Home Medications Prior to Admission medications   Medication Sig Start Date End Date Taking? Authorizing Provider  albuterol (VENTOLIN HFA) 108 (90 Base) MCG/ACT inhaler  07/12/18   [provider]  aspirin 81 MG tablet Take 81 mg by mouth daily.    [provider]  Cholecalciferol (VITAMIN D3) 5000 units CAPS Take 2 capsules (10,000 Units total) by mouth daily. Patient taking differently: Take 2 capsules by mouth daily.  01/12/18   Liane Comber, NP  Chromium-Cinnamon (CINNAMON PLUS CHROMIUM) [phone]  MCG-MG CAPS Take 2 capsules by mouth daily.    [provider]  citalopram (CELEXA) 20 MG tablet Take 1 tablet Daily for Mood 05/16/19   Unk Pinto, MD  famotidine (PEPCID) 40 MG tablet Take 40 mg by mouth as needed.  09/07/18   [provider]  fenofibrate (TRICOR) 145 MG tablet TAKE 1 TABLET DAILY FOR TRIGLYCERIDES (BLOOD FATS) 10/04/19   Unk Pinto, MD  fluticasone [facility]) 50 MCG/ACT nasal spray Place 1 spray into both nostrils daily as needed.  09/09/18   [provider]  glipiZIDE (GLUCOTROL) 10 MG tablet Take      1 tablet        3 x /day       with Meals for Diabetes (Dx: e11.9) 01/31/20   Unk Pinto, MD  losartan (COZAAR) 100 MG tablet TAKE  1 TABLET DAILY FOR BLOOD PRESSURE 12/07/19   Unk Pinto, MD  metFORMIN (GLUCOPHAGE-XR) 500 MG 24 hr tablet TAKE 2 TABLETS TWICE A DAY WITH MEALS FOR DIABETES 11/28/19   Unk Pinto, MD  Multiple Vitamins-Minerals (MEGA MULTIVITAMIN FOR MEN) TABS Take 1 tablet by mouth daily.    [provider]  [facility] ULTRA test strip USE TO CHECK BLOOD SUGAR 1 TIME DAILY-DX-E11.22 12/09/19   Liane Comber, NP  Potassium Citrate 15 MEQ (1620 MG) TBCR Take 1 tablet 2 x /day to Prevent Kidney Stones 05/12/19   Unk Pinto, MD  [facility] HFA 108 [phone] Base) MCG/ACT inhaler  06/18/18   [provider]  rosuvastatin (CRESTOR) 5 MG tablet Take    1 tablet    Daily      for Cholesterol 03/18/20   Unk Pinto, MD  Semaglutide,0.25 or 0.5MG /DOS, (OZEMPIC, 0.25 OR 0.5 MG/DOSE,) 2 MG/1.5ML SOPN Inject 0.375 mLs (0.5 mg total) into the skin once a week. 11/15/19   Vladimir Crofts, PA-C    Allergies    Doxycycline, Niacin and related, and Sulfa antibiotics  Review of Systems   Review of Systems  Constitutional: Positive for activity change, appetite change, chills and diaphoresis. Negative for fatigue and fever.  HENT: Negative for congestion, sore throat, trouble swallowing and voice change.    Respiratory: Positive for shortness of breath. Negative for cough and chest tightness.   Cardiovascular: Positive for chest pain. Negative for palpitations and leg swelling.       Burning chest pain, similar to GERD in the past.   Gastrointestinal: Positive for abdominal pain, diarrhea, nausea and vomiting. Negative for constipation.  Genitourinary: Negative for dysuria, flank pain, hematuria and penile pain.  Musculoskeletal: Negative.   Skin: Negative.   Neurological: Positive for syncope, weakness and light-headedness. Negative for dizziness, seizures, facial asymmetry, speech difficulty, numbness and headaches.  Psychiatric/Behavioral: Negative.     Physical Exam Updated Vital Signs BP 116/89 (BP Location: Right Arm)   Pulse 73   Temp (!) 97.5 F (36.4 C) (Oral)   Resp 16   Ht 5\' 10"  (1.778 m)   Wt 93 kg   SpO2 99%   BMI 29.41 kg/m   Physical Exam Vitals and nursing note reviewed.  HENT:     Head: Normocephalic and atraumatic.     Right Ear: Hearing and external ear normal.     Left Ear: Hearing and external ear normal.     Nose: Nose normal.     Mouth/Throat:     Mouth: Mucous membranes are moist.     Pharynx: Oropharynx is clear. Uvula midline. No oropharyngeal exudate, posterior oropharyngeal erythema or uvula swelling.     Tonsils: No tonsillar exudate.  Eyes:     General: Lids are normal. Vision grossly intact.        Right eye: No discharge.        Left eye: No discharge.     Extraocular Movements: Extraocular movements intact.     Conjunctiva/sclera: Conjunctivae normal.     Pupils: Pupils are equal, round, and reactive to light.     Visual Fields: Right eye visual fields normal and left eye visual fields normal.  Neck:     Trachea: Trachea and phonation normal.  Cardiovascular:     Rate and Rhythm: Normal rate and regular rhythm.     Pulses: Normal pulses.          Radial pulses are 2+ on the right side and 2+ on the left  side.       Dorsalis pedis  pulses are 2+ on the right side and 2+ on the left side.     Heart sounds: Heart sounds are distant. No murmur heard.   Pulmonary:     Effort: Pulmonary effort is normal. No respiratory distress.     Breath sounds: Normal breath sounds. No wheezing or rales.  Chest:     Chest wall: No lacerations, deformity, swelling, tenderness, crepitus or edema.  Abdominal:     General: Abdomen is protuberant. Bowel sounds are normal. There is no distension.     Palpations: Abdomen is soft.     Tenderness: There is abdominal tenderness in the epigastric area. There is no right CVA tenderness or left CVA tenderness.     Comments: Sensation of heavy pressure with palpation of the epigastrium, without pain.  Musculoskeletal:        General: No deformity.     Cervical back: Neck supple. No edema, rigidity or crepitus. No pain with movement or spinous process tenderness.     Right lower leg: No edema.     Left lower leg: No edema.  Lymphadenopathy:     Cervical: No cervical adenopathy.  Skin:    General: Skin is warm and dry.     Capillary Refill: Capillary refill takes less than 2 seconds.  Neurological:     General: No focal deficit present.     Mental Status: He is alert and oriented to person, place, and time. Mental status is at baseline.     Cranial Nerves: Cranial nerves are intact.     Sensory: Sensation is intact.     Motor: Motor function is intact.     Coordination: Coordination is intact.  Psychiatric:        Mood and Affect: Mood normal.     ED Results / Procedures / Treatments   Labs (all labs ordered are listed, but only abnormal results are displayed) Labs Reviewed  LIPASE, BLOOD - Abnormal; Notable for the following components:      Result Value   Lipase 103 (*)    All other components within normal limits  COMPREHENSIVE METABOLIC PANEL - Abnormal; Notable for the following components:   CO2 17 (*)    Glucose, Bld 335 (*)    BUN 30 (*)    Creatinine, Ser 1.47 (*)     GFR, Estimated 48 (*)    All other components within normal limits  CBC - Abnormal; Notable for the following components:   WBC 11.7 (*)    Platelets 106 (*)    All other components within normal limits  URINALYSIS, ROUTINE W REFLEX MICROSCOPIC - Abnormal; Notable for the following components:   APPearance CLOUDY (*)    Glucose, UA >=500 (*)    Ketones, ur 5 (*)    All other components within normal limits  CBG MONITORING, ED - Abnormal; Notable for the following components:   Glucose-Capillary 358 (*)    All other components within normal limits  MAGNESIUM  TROPONIN I (HIGH SENSITIVITY)    EKG EKG Interpretation  Date/Time:  Friday March 20 2020 10:53:49 EST Ventricular Rate:  70 PR Interval:    QRS Duration: 114 QT Interval:  440 QTC Calculation: 475 R Axis:   -61 Text Interpretation: Ectopic atrial rhythm Ventricular premature complex Incomplete RBBB and LAFB No significant change since last tracing Confirmed by Calvert Cantor [phone]) on 03/20/2020 2:54:45 PM   Radiology DG Chest 2 View  Result Date: 03/20/2020  CLINICAL DATA:  Dizziness and syncope last night. EXAM: CHEST - 2 VIEW COMPARISON:  PA and lateral chest 07/18/2018 FINDINGS: Lungs clear. Heart size normal. No pneumothorax or pleural fluid. No bony abnormality. IMPRESSION: Negative chest. Electronically Signed   By: Inge Rise M.D.   On: 03/20/2020 15:32   CT Head Wo Contrast  Result Date: 03/20/2020 CLINICAL DATA:  Syncope, normal neuro examination. EXAM: CT HEAD WITHOUT CONTRAST TECHNIQUE: Contiguous axial images were obtained from the base of the skull through the vertex without intravenous contrast. COMPARISON:  May 18, 2011 FINDINGS: Brain: No evidence of acute infarction, hemorrhage, hydrocephalus, extra-axial collection or mass lesion/mass effect. Mild atrophy and chronic microvascular ischemic changes. Vascular: No hyperdense vessel or unexpected calcification. Skull: Normal. Negative for  fracture or focal lesion. Sinuses/Orbits: Visualized paranasal sinuses and orbits are unremarkable. Other: None. IMPRESSION: 1. No acute intracranial abnormality. 2. Mild atrophy and chronic microvascular ischemic changes. Electronically Signed   By: Zetta Bills M.D.   On: 03/20/2020 15:45    Procedures Procedures (including critical care time)  Medications Ordered in ED Medications - No data to display  ED Course  I have reviewed the triage vital signs and the nursing notes.  Pertinent labs & imaging results that were available during my care of the patient were reviewed by me and considered in my medical decision making (see chart for details).    MDM Rules/Calculators/A&P                         78 year old male who presents with concern for nausea/vomiting/diarrhea, as well as syncopal episode this morning.  Patient with remote history of the same.  Patient has completed 3 vaccinations against COVID-19.  The differential for syncope is extensive and includes, but is not limited to: arrythmia (Vtach, SVT, SSS, sinus arrest, AV block, bradycardia), aortic stenosis, AMI, hypertrophic obstructive cardiomyopathy (HOCM), PE, atrial myxoma, pulmonary hypertension, orthostatic hypotension, hypovolemia, drug effect, GB syndrome, micturition, cough, carotid sinus sensitivity, seizure, TIA/CVA, hypoglycemia, vertigo.  Additional differential diagnosis for this patient's infectious symptoms include but not limited to COVID-19, influenza, viral gastroenteritis, or other infectious causes.   Hypertensive on intake to 155/55.  Vital signs otherwise normal.  CBG elevated to 358.  Laboratory studies performed in triage CBC with mild leukocytosis 11.7, CMP with elevated creatinine, at patient baseline.  Lipase elevated mildly to 103.  UA unremarkable.  Cardiopulmonary exam reassuring, neurologic exam without focal deficit.  Mild epigastric tenderness to palpation.  Patient declining antiemetic at this  time.  We will proceed with respiratory pathogen panel, mag, troponin, EKG, chest x-ray, CT head, right upper quadrant ultrasound.  EKG with atrial rhythm, PVCs, incomplete right bundle branch block, no STEMI, no change from prior.  Negative chest x-ray.  Negative head CT.  Troponin negative, 5.   Respiratory pathogen panel negative for COVID-19, influenza A/B.   RUQ [place] With gallstones without findings of acute cholecystitis, no biliary dilatation.  Diffusely increased heterogenous hepatic parenchyma typical of steatosis.  Incidental finding of subcentimeter hypoechoic lesion in pancreatic head, radiologist recommending further evaluation with pancreatic protocol MRI versus CT on an elective basis.  Discussed CT findings with patient, he was informed of pancreatic lesion; he is agreeable to following this up with his primary care doctor in outpatient basis.  Overall work-up today very reassuring.  Suspect vasovagal syncope secondary to vomiting and diarrhea this morning.  Patient well-appearing at this time, completely asymptomatic.  Reassuring vital signs and EKG.  Given reassuring physical exam, laboratory studies, imaging studies, and vital signs, no further work-up is warranted in the emergency department at this time.  Recommend close follow-up with PCP; patient has appointment already established for 03/25/2020. Will also provide contact info for cardiologist.   Onalee Hua voiced understanding of his medical evaluation and treatment plan.  Each of his questions were answered to his expressed satisfaction.  Return precautions were given.  Patient is well appearing, stable, and appropriate for discharge at this time.  This chart was dictated using voice recognition software, Dragon. Despite the best efforts of this provider to proofread and correct errors, errors may still occur which can change documentation meaning.  Final Clinical Impression(s) / ED Diagnoses Final diagnoses:  Epigastric  pain  Chest pain    Rx / DC Orders ED Discharge Orders    None       Sherrilee Gilles 03/20/20 2110    Pollyann Savoy, MD 03/20/20 2153

## 2020-03-20 NOTE — ED Triage Notes (Signed)
Per EMS-N/V/D that started last night and dizziness which started this am-patient states he "passed out" last night-700 cc of NS and 4 mg of Zofran given in route

## 2020-03-25 ENCOUNTER — Ambulatory Visit: Payer: Medicare Other | Admitting: Adult Health

## 2020-04-02 ENCOUNTER — Other Ambulatory Visit: Payer: Self-pay

## 2020-04-02 ENCOUNTER — Ambulatory Visit: Payer: Medicare Other | Admitting: Adult Health

## 2020-04-02 ENCOUNTER — Encounter: Payer: Self-pay | Admitting: Adult Health

## 2020-04-02 VITALS — BP 124/68 | HR 80 | Temp 97.5°F | Ht 70.0 in | Wt 220.0 lb

## 2020-04-02 DIAGNOSIS — E1169 Type 2 diabetes mellitus with other specified complication: Secondary | ICD-10-CM

## 2020-04-02 DIAGNOSIS — I723 Aneurysm of iliac artery: Secondary | ICD-10-CM

## 2020-04-02 DIAGNOSIS — R0989 Other specified symptoms and signs involving the circulatory and respiratory systems: Secondary | ICD-10-CM | POA: Diagnosis not present

## 2020-04-02 DIAGNOSIS — E1142 Type 2 diabetes mellitus with diabetic polyneuropathy: Secondary | ICD-10-CM

## 2020-04-02 DIAGNOSIS — K862 Cyst of pancreas: Secondary | ICD-10-CM | POA: Insufficient documentation

## 2020-04-02 DIAGNOSIS — E785 Hyperlipidemia, unspecified: Secondary | ICD-10-CM

## 2020-04-02 DIAGNOSIS — F3341 Major depressive disorder, recurrent, in partial remission: Secondary | ICD-10-CM

## 2020-04-02 DIAGNOSIS — I700 Atherosclerosis of aorta: Secondary | ICD-10-CM

## 2020-04-02 DIAGNOSIS — N183 Chronic kidney disease, stage 3 unspecified: Secondary | ICD-10-CM

## 2020-04-02 DIAGNOSIS — E66811 Obesity, class 1: Secondary | ICD-10-CM

## 2020-04-02 DIAGNOSIS — E1122 Type 2 diabetes mellitus with diabetic chronic kidney disease: Secondary | ICD-10-CM | POA: Diagnosis not present

## 2020-04-02 DIAGNOSIS — E559 Vitamin D deficiency, unspecified: Secondary | ICD-10-CM

## 2020-04-02 DIAGNOSIS — D696 Thrombocytopenia, unspecified: Secondary | ICD-10-CM

## 2020-04-02 DIAGNOSIS — N1832 Chronic kidney disease, stage 3b: Secondary | ICD-10-CM

## 2020-04-02 DIAGNOSIS — K869 Disease of pancreas, unspecified: Secondary | ICD-10-CM

## 2020-04-02 DIAGNOSIS — E669 Obesity, unspecified: Secondary | ICD-10-CM

## 2020-04-02 DIAGNOSIS — Z79899 Other long term (current) drug therapy: Secondary | ICD-10-CM

## 2020-04-02 MED ORDER — FLUTICASONE PROPIONATE 50 MCG/ACT NA SUSP
2.0000 | Freq: Every day | NASAL | 2 refills | Status: DC
Start: 2020-04-02 — End: 2021-05-06

## 2020-04-02 MED ORDER — ONETOUCH ULTRA VI STRP: ORAL_STRIP | 2 refills | Status: DC

## 2020-04-02 NOTE — Progress Notes (Signed)
FOLLOW UP  Assessment and Plan:   Aneurysm of left iliac artery Control BP; followed annually by vascular Dr. Trula Slade  Hypertension Well controlled with current medications  Monitor blood pressure at home; patient to call if consistently greater than 130/80 Continue DASH diet.   Reminder to go to the ER if any CP, SOB, nausea, dizziness, severe HA, changes vision/speech, left arm numbness and tingling and jaw pain.  Hyperlipidemia associated with T2DM (HCC) Continue rosuvastatin 5 mg (LDL at goal <70), continue fish oil and fenofibrate for trigs, lifestyle for trigs reviewed, needs to get sugars controlled Continue low cholesterol diet and exercise.  Check lipid panel.   Diabetes with diabetic chronic kidney disease ([place]) Continue medication: metformin 2000 mg daily, glipizide 10 mg TID, now on ozempic 0.5 mg daily - plan to increase to 1 mg weekly if severely elevated. Patient wants to work on lifestyle first -  Admits to lots of processed carb splurges; carbohydrate sources reviewed and emphasized  Continue diet and exercise.  Perform daily foot/skin check, notify office of any concerning changes.  Check A1C  CKD IIIb associated with T2DM (HCC) Increase fluids, avoid NSAIDS, monitor sugars, will monitor  Obesity with co morbidities Long discussion about weight loss, diet, and exercise Recommended diet heavy in fruits and veggies and low in animal meats, cheeses, and dairy products, appropriate calorie intake Discussed ideal weight for height and initial weight goal (200 lb) Patient will work on lower carb diet - he reports has done well in the past logging - plans to retart  Will follow up in 3 months  Vitamin D Def continue supplementation to maintain goal of 60-100 Defer Vit D level  Pancreatic lesion Incidentally noted, MRI w/wo ordered and pending to further characterize -  Continue diet and meds as discussed. Further disposition pending results of labs. Discussed  med's effects and SE's.   Over 30 minutes of exam, counseling, chart review, and critical decision making was performed.   Future Appointments  Date Time Provider [place]  06/24/2020  2:30 PM Unk Pinto, MD [facility] None  09/24/2020  2:30 PM Garnet Sierras, NP [facility] None  12/28/2020  3:00 PM Unk Pinto, MD [facility] None    ----------------------------------------------------------------------------------------------------------------------  HPI 80 y.o. male  presents for 3 month follow up on hypertension, cholesterol, diabetes, weight and vitamin D deficiency.   He was recently on 03/20/2020 evaluted at ED for syncope and epigastric pain, US abdomen showed gallstones without acute cholecystitis, no biliary dilation, changes suggestive of hepatic steatosis, and also Incidental note of subcentimeter hypoechoic lesion in the pancreatic head/proximal body recommended for MRI. 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    He has hx of GERD but currently managing by lifestyle, has pepcid to take if needed but hasn't needed.   BMI is Body mass index is 31.57 kg/m., he has been working on diet. His goal is <200 lb. Weighs regularly at home. He isn't exercising much due to his wife. He does admit to splurging too much with candy with recent holidays.  Wt Readings from Last 3 Encounters:  04/02/20 220 lb (99.8 kg)  03/20/20 205 lb (93 kg)  12/16/19 215 lb 9.6 oz (97.8 kg)   He has right common iliac artery aneurysm that was found incidentally on a CT scan in 2018 for hematuria.  At that time, maximum diameter was 2 cm. He follows annually with Dr. Trula Slade.  His blood pressure has been controlled at home, today their BP is BP: 124/68  He does not workout. He denies chest pain, shortness of breath, dizziness.   He is on  cholesterol medication Rosuvastatin 5 mg daily and Fenofibrate 145 mg and fish oil and denies myalgias. His LDL cholesterol is at goal. The cholesterol last visit was:   Lab Results  Component Value Date   CHOL 104 12/16/2019   HDL 27 (L) 12/16/2019   LDLCALC 47 12/16/2019   TRIG 259 (H) 12/16/2019   CHOLHDL 3.9 12/16/2019    He has not been working on diet and exercise for T2DM on metformin 1000 mg BID, glipizide 10 mg TID, ozempic 0.5 mg weekly and cinnamon supplement, and denies hypoglycemia , nausea, paresthesia of the feet, polydipsia, polyuria, visual disturbances and vomiting. He does check fasting glucose (reports has been running 200+ recently, attributes to focusing on caring for wife, not watching diet as he should).  Last A1C in the office was:  Lab Results  Component Value Date   HGBA1C 7.7 (H) 12/16/2019    He has CKD III monitored via this office:  Lab Results  Component Value Date   GFRNONAA 48 (L) 03/20/2020   Patient is on Vitamin D supplement, taking 10000 IU, dose has not change    Lab Results  Component Value Date   VD25OH 16 12/16/2019        Current Medications:  Current Outpatient Medications on File Prior to Visit  Medication Sig  . albuterol (VENTOLIN HFA) 108 (90 Base) MCG/ACT inhaler   . losartan (COZAAR) 100 MG tablet TAKE 1 TABLET DAILY FOR BLOOD PRESSURE  . metFORMIN (GLUCOPHAGE-XR) 500 MG 24 hr tablet TAKE 2 TABLETS TWICE A DAY WITH MEALS FOR DIABETES  . Multiple Vitamins-Minerals (MEGA MULTIVITAMIN FOR MEN) TABS Take 1 tablet by mouth daily.  Glory Rosebush ULTRA test strip USE TO CHECK BLOOD SUGAR 1 TIME DAILY-DX-E11.22  . Potassium Citrate 15 MEQ (1620 MG) TBCR Take 1 tablet 2 x /day to Prevent Kidney Stones  . PROAIR HFA 108 (90 Base) MCG/ACT inhaler   . rosuvastatin (CRESTOR) 5 MG tablet Take    1 tablet    Daily      for Cholesterol  . Semaglutide,0.25 or 0.5MG /DOS, (OZEMPIC, 0.25 OR 0.5 MG/DOSE,) 2 MG/1.5ML SOPN Inject 0.375 mLs (0.5 mg total)  into the skin once a week.  Marland Kitchen aspirin 81 MG tablet Take 81 mg by mouth daily.  . Cholecalciferol (VITAMIN D3) 5000 units CAPS Take 2 capsules (10,000 Units total) by mouth daily. (Patient taking differently: Take 2 capsules by mouth daily. )  . Chromium-Cinnamon (CINNAMON PLUS CHROMIUM) [phone] MCG-MG CAPS Take 2 capsules by mouth daily.  . citalopram (CELEXA) 20 MG tablet Take 1 tablet Daily for Mood (Patient not taking: Reported on 04/02/2020)  . famotidine (PEPCID) 40 MG tablet Take 40 mg by mouth as needed.   . fenofibrate (TRICOR) 145 MG tablet TAKE 1 TABLET DAILY FOR TRIGLYCERIDES (BLOOD FATS)  . fluticasone (FLONASE) 50 MCG/ACT nasal spray Place 1 spray into both nostrils daily as needed.   Marland Kitchen glipiZIDE (GLUCOTROL) 10 MG tablet Take      1 tablet        3 x /day       with Meals for Diabetes (Dx: e11.9)   No current facility-administered medications on file prior to visit.     Allergies:  Allergies  Allergen Reactions  . Doxycycline     unknown  . Niacin And Related Other (See Comments)    flushing  . Sulfa Antibiotics Itching     Medical History:  Past Medical History:  Diagnosis Date  . Allergic rhinitis   . Bladder stones   . CKD (chronic kidney disease), stage III ([place])   . Common iliac aneurysm (HCC) right side   followed by vascular-- dr Trula Slade (note in epic)  . Environmental and seasonal allergies   . External carotid artery stenosis     per last duplex 05-18-2011  right moderate stenosis and left moderate to severe  . Hematuria   . History of adenomatous polyp of colon    tubular adenoma's  . History of bladder stone   . History of DVT in adulthood    08/ 2009 right lower extremity;  2013  left lower extremity  (10-11-2018 per pt no dvt since 2013)  . History of kidney stones   . History of pulmonary embolism    bilatera lung  08/ 2009  (10-11-2018  per pt no pe since 2009)  . History of wheezing    allergies--- prn inhaler  . Hypertension   .  Hypogonadism male   . Internal carotid artery stenosis, bilateral     per last duplex 05-18-2011 in epic,   bilateral 40-59% stenosis  (10-11-2018  per pt denies stroke/tia S&S)  . Lower urinary tract symptoms (LUTS)   . Mixed hyperlipidemia   . Nephrolithiasis   . Testosterone deficiency   . Type 2 diabetes mellitus ([place])   . Vitamin D deficiency   . Wears glasses    Family history- Reviewed and unchanged Social history- Reviewed and unchanged   Review of Systems:  Review of Systems  Constitutional: Negative for malaise/fatigue and weight loss.  HENT: Negative for hearing loss and tinnitus.   Eyes: Negative for blurred vision and double vision.  Respiratory: Negative for cough, shortness of breath and wheezing.   Cardiovascular: Negative for chest pain, palpitations, orthopnea, claudication and leg swelling.  Gastrointestinal: Negative for abdominal pain, blood in stool, constipation, diarrhea, heartburn, melena, nausea and vomiting.  Genitourinary: Negative.   Musculoskeletal: Negative for joint pain and myalgias.  Skin: Negative for rash.  Neurological: Negative for dizziness, tingling, sensory change, weakness and headaches.  Endo/Heme/Allergies: Negative for polydipsia.  Psychiatric/Behavioral: Negative.   All other systems reviewed and are negative.    Physical Exam: BP 124/68   Pulse 80   Temp (!) 97.5 F (36.4 C)   Ht 5\' 10"  (1.778 m)   Wt 220 lb (99.8 kg)   SpO2 97%   BMI 31.57 kg/m  Wt Readings from Last 3 Encounters:  04/02/20 220 lb (99.8 kg)  03/20/20 205 lb (93 kg)  12/16/19 215 lb 9.6 oz (97.8 kg)   General Appearance: Well nourished, in no apparent distress. Eyes: PERRLA, EOMs, conjunctiva no swelling or erythema Sinuses: No Frontal/maxillary tenderness ENT/Mouth: Ext aud canals clear, TMs without erythema, bulging. No erythema, swelling, or exudate on post pharynx.  Tonsils not swollen or erythematous. Hearing normal.  Neck: Supple, thyroid normal.   Respiratory: Respiratory effort normal, BS equal bilaterally without rales, rhonchi, wheezing or stridor.  Cardio: RRR with no MRGs. Brisk peripheral pulses without edema.  Abdomen: Soft, + BS.  Non tender, no guarding, rebound, hernias, masses. Lymphatics: Non tender without lymphadenopathy.  Musculoskeletal: Full ROM, 5/5 strength, Normal gait Skin: Warm, dry without rashes, lesions, ecchymosis.  Neuro: Cranial nerves  intact. No cerebellar symptoms.  Psych: Awake and oriented X 3, normal affect, Insight and Judgment appropriate.    Izora Ribas, NP 4:12 PM [facility]

## 2020-04-02 NOTE — Patient Instructions (Signed)
Please reduce processed starches  Increase fiber - note daily intake goal and keep a log daily   Goal fasting glucose <130  If starting to run low, will reduce glipizide first - this med is like insulin, tends to encourage weight gain       High-Fiber Eating Plan Fiber, also called dietary fiber, is a type of carbohydrate. It is found foods such as fruits, vegetables, whole grains, and beans. A high-fiber diet can have many health benefits. Your health care provider may recommend a high-fiber diet to help:  Prevent constipation. Fiber can make your bowel movements more regular.  Lower your cholesterol.  Relieve the following conditions: ? Inflammation of veins in the anus (hemorrhoids). ? Inflammation of specific areas of the digestive tract (uncomplicated diverticulosis). ? A problem of the large intestine, also called the colon, that sometimes causes pain and diarrhea (irritable bowel syndrome, or IBS).  Prevent overeating as part of a weight-loss plan.  Prevent heart disease, type 2 diabetes, and certain cancers. What are tips for following this plan? Reading food labels  Check the nutrition facts label on food products for the amount of dietary fiber. Choose foods that have 5 grams of fiber or more per serving.  The goals for recommended daily fiber intake include: ? Men (age 80 or younger): 34-38 g. ? Men (over age 65): 28-34 g. ? Women (age 20 or younger): 25-28 g. ? Women (over age 21): 22-25 g. Your daily fiber goal is _____________ g.   Shopping  Choose whole fruits and vegetables instead of processed forms, such as apple juice or applesauce.  Choose a wide variety of high-fiber foods such as avocados, lentils, oats, and kidney beans.  Read the nutrition facts label of the foods you choose. Be aware of foods with added fiber. These foods often have high sugar and sodium amounts per serving. Cooking  Use whole-grain flour for baking and cooking.  Cook with  brown rice instead of white rice. Meal planning  Start the day with a breakfast that is high in fiber, such as a cereal that contains 5 g of fiber or more per serving.  Eat breads and cereals that are made with whole-grain flour instead of refined flour or white flour.  Eat brown rice, bulgur wheat, or millet instead of white rice.  Use beans in place of meat in soups, salads, and pasta dishes.  Be sure that half of the grains you eat each day are whole grains. General information  You can get the recommended daily intake of dietary fiber by: ? Eating a variety of fruits, vegetables, grains, nuts, and beans. ? Taking a fiber supplement if you are not able to take in enough fiber in your diet. It is better to get fiber through food than from a supplement.  Gradually increase how much fiber you consume. If you increase your intake of dietary fiber too quickly, you may have bloating, cramping, or gas.  Drink plenty of water to help you digest fiber.  Choose high-fiber snacks, such as berries, raw vegetables, nuts, and popcorn. What foods should I eat? Fruits Berries. Pears. Apples. Oranges. Avocado. Prunes and raisins. Dried figs. Vegetables Sweet potatoes. Spinach. Kale. Artichokes. Cabbage. Broccoli. Cauliflower. Green peas. Carrots. Squash. Grains Whole-grain breads. Multigrain cereal. Oats and oatmeal. Brown rice. Barley. Bulgur wheat. [place]. Quinoa. Bran muffins. Popcorn. Rye wafer crackers. Meats and other proteins Navy beans, kidney beans, and pinto beans. Soybeans. Split peas. Lentils. Nuts and seeds. Dairy Fiber-fortified yogurt. Beverages  Fiber-fortified soy milk. Fiber-fortified orange juice. Other foods Fiber bars. The items listed above may not be a complete list of recommended foods and beverages. Contact a dietitian for more information. What foods should I avoid? Fruits Fruit juice. Cooked, strained fruit. Vegetables Fried potatoes. Canned vegetables.  Well-cooked vegetables. Grains White bread. Pasta made with refined flour. White rice. Meats and other proteins Fatty cuts of meat. Fried chicken or fried fish. Dairy Milk. Yogurt. Cream cheese. Sour cream. Fats and oils Butters. Beverages Soft drinks. Other foods Cakes and pastries. The items listed above may not be a complete list of foods and beverages to avoid. Talk with your dietitian about what choices are best for you. Summary  Fiber is a type of carbohydrate. It is found in foods such as fruits, vegetables, whole grains, and beans.  A high-fiber diet has many benefits. It can help to prevent constipation, lower blood cholesterol, aid weight loss, and reduce your risk of heart disease, diabetes, and certain cancers.  Increase your intake of fiber gradually. Increasing fiber too quickly may cause cramping, bloating, and gas. Drink plenty of water while you increase the amount of fiber you consume.  The best sources of fiber include whole fruits and vegetables, whole grains, nuts, seeds, and beans. This information is not intended to replace advice given to you by your health care provider. Make sure you discuss any questions you have with your health care provider. Document Revised: 07/11/2019 Document Reviewed: 07/11/2019 Elsevier Patient Education  2021 [employer].

## 2020-04-03 LAB — MAGNESIUM: Magnesium: 1.8 mg/dL (ref 1.5–2.5)

## 2020-04-03 LAB — COMPLETE METABOLIC PANEL WITH GFR
AG Ratio: 2 (calc) (ref 1.0–2.5)
ALT: 15 U/L (ref 9–46)
AST: 12 U/L (ref 10–35)
Albumin: 4.3 g/dL (ref 3.6–5.1)
Alkaline phosphatase (APISO): 54 U/L (ref 35–144)
BUN/Creatinine Ratio: 18 (calc) (ref 6–22)
BUN: 27 mg/dL — ABNORMAL HIGH (ref 7–25)
CO2: 25 mmol/L (ref 20–32)
Calcium: 9.7 mg/dL (ref 8.6–10.3)
Chloride: 108 mmol/L (ref 98–110)
Creat: 1.51 mg/dL — ABNORMAL HIGH (ref 0.70–1.18)
GFR, Est African American: 50 mL/min/{1.73_m2} — ABNORMAL LOW (ref >=60)
GFR, Est Non African American: 43 mL/min/{1.73_m2} — ABNORMAL LOW (ref >=60)
Globulin: 2.2 g/dL (calc) (ref 1.9–3.7)
Glucose, Bld: 167 mg/dL — ABNORMAL HIGH (ref 65–99)
Potassium: 3.9 mmol/L (ref 3.5–5.3)
Sodium: 141 mmol/L (ref 135–146)
Total Bilirubin: 0.5 mg/dL (ref 0.2–1.2)
Total Protein: 6.5 g/dL (ref 6.1–8.1)

## 2020-04-03 LAB — CBC WITH DIFFERENTIAL/PLATELET
Absolute Monocytes: 406 cells/uL (ref 200–950)
Basophils Absolute: 70 cells/uL (ref 0–200)
Basophils Relative: 0.9 %
Eosinophils Absolute: 179 cells/uL (ref 15–500)
Eosinophils Relative: 2.3 %
HCT: 38.5 % (ref 38.5–50.0)
Hemoglobin: 13.3 g/dL (ref 13.2–17.1)
Lymphs Abs: 2153 cells/uL (ref 850–3900)
MCH: 31.1 pg (ref 27.0–33.0)
MCHC: 34.5 g/dL (ref 32.0–36.0)
MCV: 90.2 fL (ref 80.0–100.0)
MPV: 9.9 fL (ref 7.5–12.5)
Monocytes Relative: 5.2 %
Neutro Abs: 4992 cells/uL (ref 1500–7800)
Neutrophils Relative %: 64 %
Platelets: 142 10*3/uL (ref 140–400)
RBC: 4.27 10*6/uL (ref 4.20–5.80)
RDW: 12.9 % (ref 11.0–15.0)
Total Lymphocyte: 27.6 %
WBC: 7.8 10*3/uL (ref 3.8–10.8)

## 2020-04-03 LAB — LIPID PANEL
Cholesterol: 133 mg/dL (ref <200)
HDL: 24 mg/dL — ABNORMAL LOW (ref >=40)
LDL Cholesterol (Calc): 63 mg/dL (calc)
Non-HDL Cholesterol (Calc): 109 mg/dL (calc) (ref <130)
Total CHOL/HDL Ratio: 5.5 (calc) — ABNORMAL HIGH (ref <5.0)
Triglycerides: 379 mg/dL — ABNORMAL HIGH (ref <150)

## 2020-04-03 LAB — HEMOGLOBIN A1C
Hgb A1c MFr Bld: 8.4 % of total Hgb — ABNORMAL HIGH (ref <5.7)
Mean Plasma Glucose: 194 mg/dL
eAG (mmol/L): 10.8 mmol/L

## 2020-04-03 LAB — TSH: TSH: 2.03 mIU/L (ref 0.40–4.50)

## 2020-04-11 ENCOUNTER — Other Ambulatory Visit: Payer: Medicare Other

## 2020-04-18 ENCOUNTER — Other Ambulatory Visit: Payer: Medicare Other

## 2020-04-25 ENCOUNTER — Ambulatory Visit
Admission: RE | Admit: 2020-04-25 | Discharge: 2020-04-25 | Disposition: A | Discharge status: Home / Self Care | Payer: Medicare Other | Source: Ambulatory Visit | Attending: Adult Health | Admitting: Adult Health

## 2020-04-25 ENCOUNTER — Other Ambulatory Visit: Payer: Self-pay

## 2020-04-25 DIAGNOSIS — K869 Disease of pancreas, unspecified: Secondary | ICD-10-CM

## 2020-04-25 IMAGING — MR MR ABDOMEN WO/W CM
10 of 18 series · 25 of 48 positions shown · IV contrast (20ml Multihance)
Comparison: Abdominal ultrasound, [DATE], CT abdomen pelvis,
[DATE]

CLINICAL DATA: Characterize incidental pancreatic lesion identified
by ultrasound

EXAM:
MRI ABDOMEN WITHOUT AND WITH CONTRAST
TECHNIQUE: Multiplanar multisequence MR imaging of the abdomen was performed
both before and after the administration of intravenous contrast.
CONTRAST:  20mL MULTIHANCE GADOBENATE DIMEGLUMINE 529 MG/ML IV SOLN

[Series 4: cor haste · coronal · 5.0mm · 0.74mm/px · 2 of 36 slices shown]
[im 1/36]
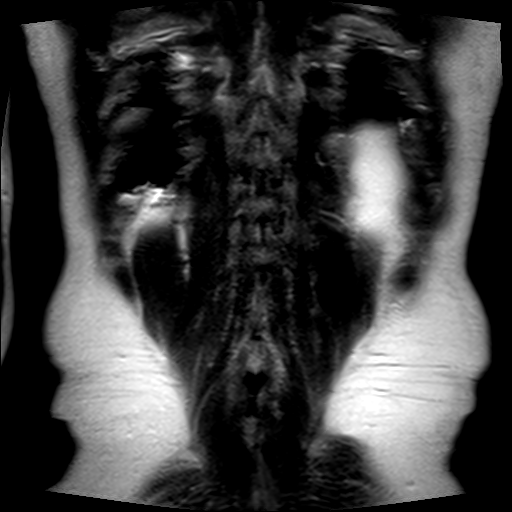
[im 36/36]
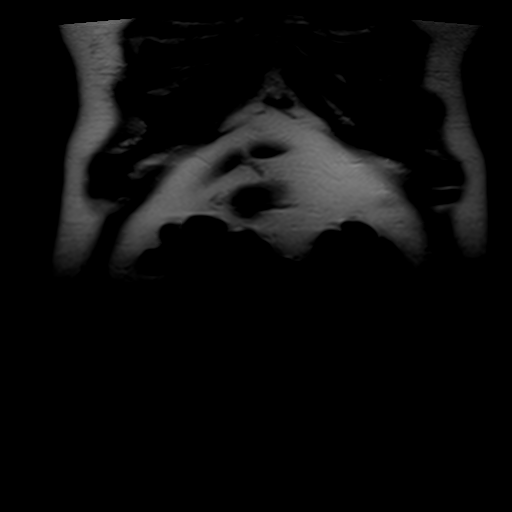

[Series 5: axial haste · axial · 6.0mm · 0.76mm/px · z∈[-182,+75]mm · 2 of 40 slices shown]
[im 1/40]
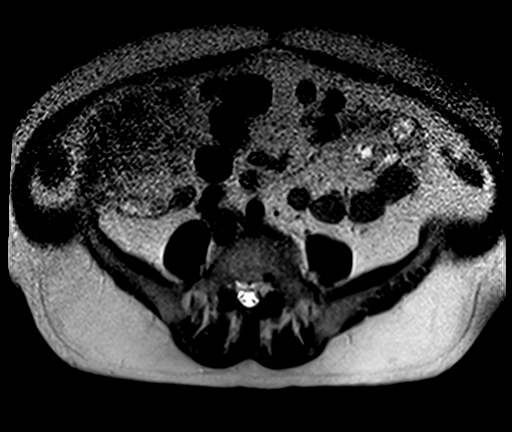
[im 40/40]
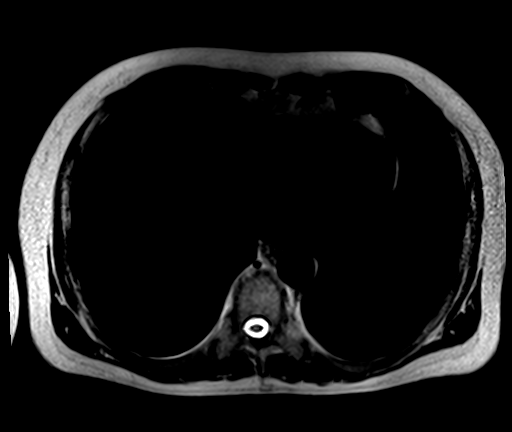

[Series 6: T1 · axial · 6.0mm · 0.74mm/px · z∈[-150,+81]mm · 4 of 72 slices shown]
[im 1/72]
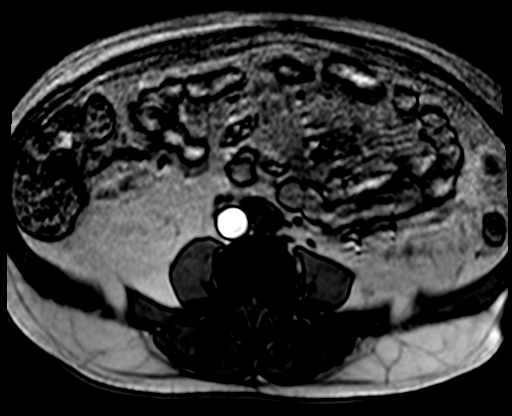
[im 24/72]
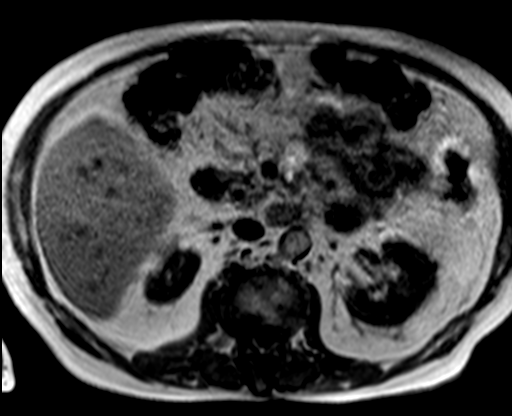
[im 48/72]
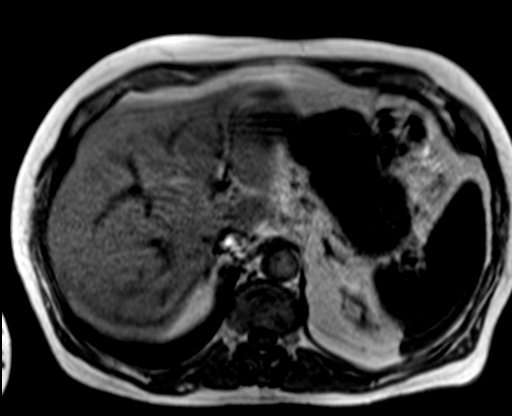
[im 72/72]
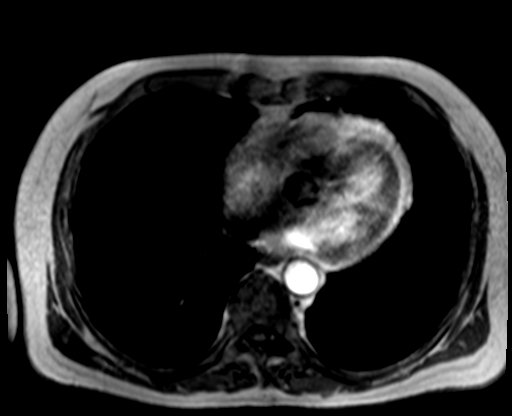

[Series 7: T2 · coronal · 3.0mm · 0.76mm/px · 2 of 48 slices shown (1 of 2)]
[im 1/48]
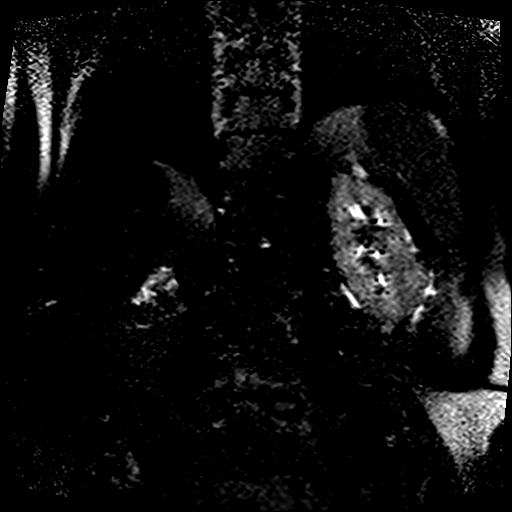
[im 48/48]
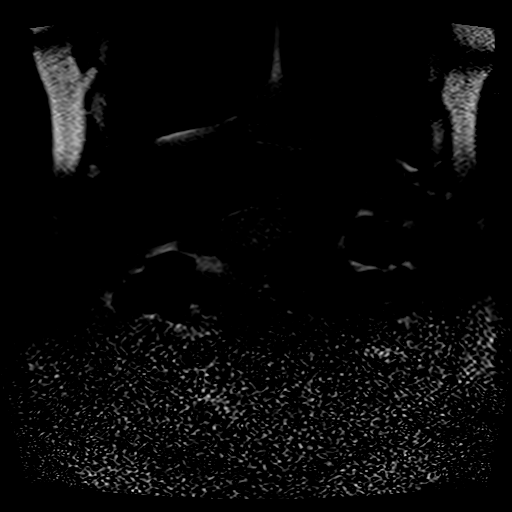

[Series 10: T2 · axial · 6.0mm · 1.19mm/px · 1 of 33 slices shown (2 of 2)]
[im 1/33]
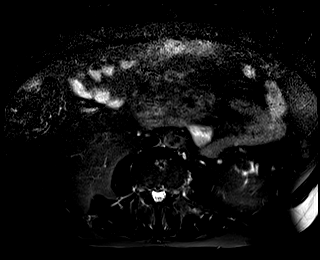

[Series 11: [id number]_trig · axial · 6.0mm · 1.98mm/px · z∈[-142,+89]mm · 4 of 108 slices shown]
[im 1/108]
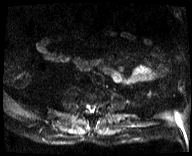
[im 36/108]
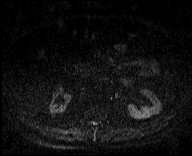
[im 72/108]
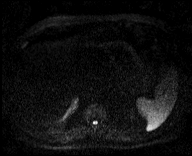
[im 108/108]
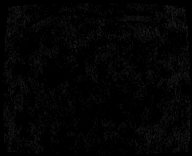

[Series 12: [id number]_trig_adc · axial · 6.0mm · 1.98mm/px · 1 of 36 slices shown]
[im 1/36]
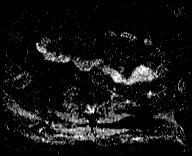

[Series 15: T1 dynamic · axial · non-contrast · 2.5mm · 0.74mm/px · z∈[-161,+77]mm · 3 of 96 slices shown]
[im 1/96]
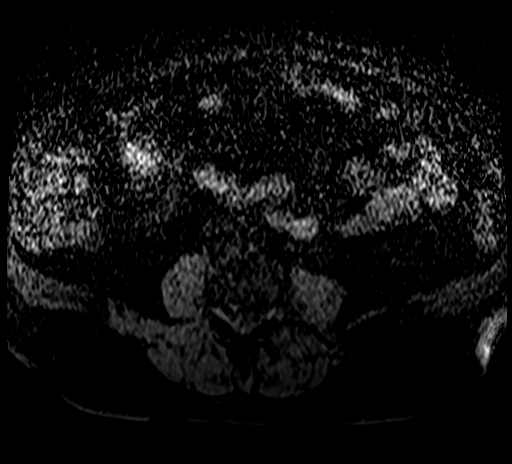
[im 48/96]
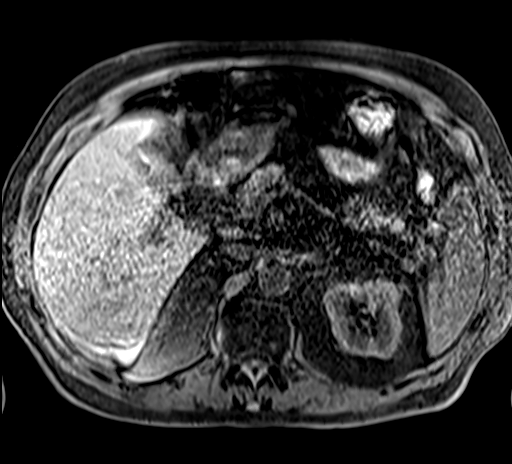
[im 96/96]
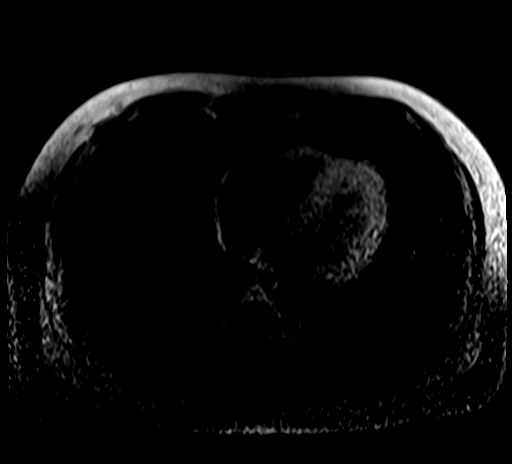

[Series 16: T1 dynamic post-contrast · axial · 2.5mm · 0.74mm/px · z∈[-161,+77]mm · 3 of 96 slices shown (1 of 2)]
[im 1/96]
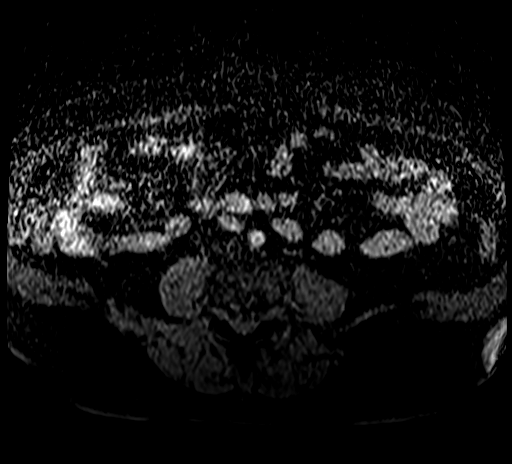
[im 48/96]
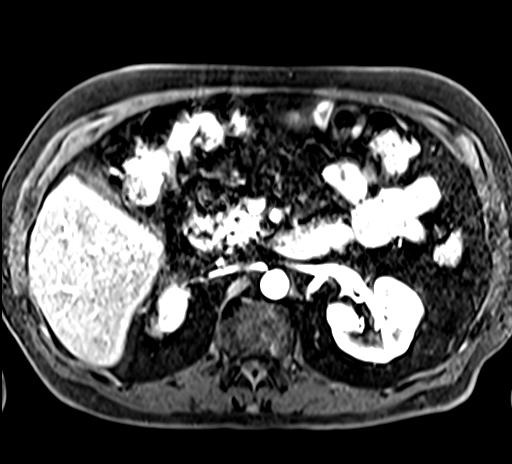
[im 96/96]
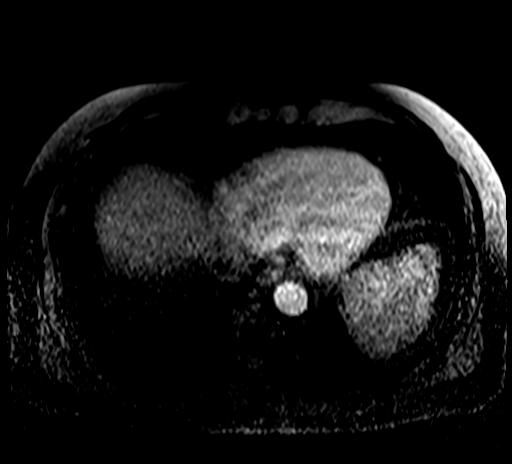

[Series 17: T1 dynamic post-contrast · axial · 2.5mm · 0.74mm/px · z∈[-161,+77]mm · 3 of 96 slices shown (2 of 2)]
[im 1/96]
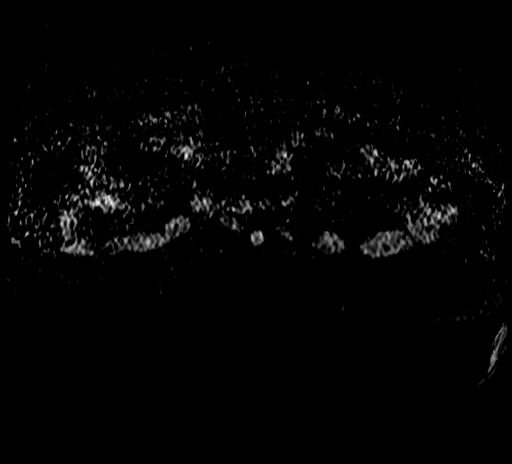
[im 48/96]
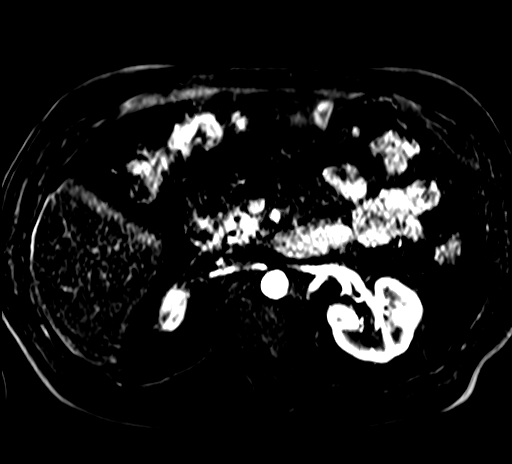
[im 96/96]
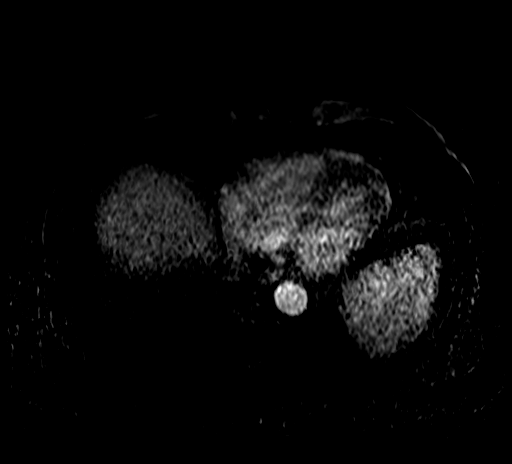

[25 of 48 positions shown; findings below may reference images not displayed]

FINDINGS: Evaluation of the abdomen is limited by breath motion artifact
throughout.

Lower chest: No acute findings.

Hepatobiliary: Hepatic steatosis. No mass or other parenchymal
abnormality identified.

Pancreas: There are numerous fluid signal cystic lesions identified
throughout the pancreatic parenchyma, the largest a partially
exophytic lesion of the pancreatic uncinate measuring 2.3 x 1.4 cm
(series 5, image 28), and additional lesion of the central
pancreatic head measures 1.1 x 0.6 cm (series 5, image 25), and an
additional dominant lesion of the pancreatic body measuring 1.0 x
1.0 cm (series 5, image 17). No solid mass, inflammatory changes, or
other parenchymal abnormality identified. No pancreatic ductal
dilatation.

Spleen:  Within normal limits in size and appearance.

Adrenals/Urinary Tract: No masses identified. Asymmetrically
atrophic right kidney. No evidence of hydronephrosis.

Stomach/Bowel: Visualized portions within the abdomen are
unremarkable.

Vascular/Lymphatic: No pathologically enlarged lymph nodes
identified. No abdominal aortic aneurysm demonstrated.

Other:  None.

Musculoskeletal: No suspicious bone lesions identified.
IMPRESSION: 1. Evaluation of the abdomen is limited by breath motion artifact.
2. Within this limitation, there are numerous fluid signal cystic
lesions identified throughout the pancreatic parenchyma, the largest
a partially exophytic lesion of the pancreatic uncinate measuring
2.3 x 1.4 cm. An additional lesion of the pancreatic head measuring
1.1 x 0.6 cm likely corresponds to finding of prior ultrasound.
Given multiplicity and the presence of parenchymal calcifications on
prior CT dated [DATE], these likely reflect small pseudocysts,
chronic sequelae of prior pancreatitis, however some may reflect
small side branch IPMNs. ACR white paper recommendations on
follow-up of incidental cystic pancreatic lesions indicate initial
MRI follow-up at 6 months to establish stability given size of
largest lesion.
3. Hepatic steatosis.
4. Asymmetrically atrophic right kidney, in keeping with prior
obstructive, vascular, or infectious insult.

## 2020-04-25 MED ORDER — GADOBENATE DIMEGLUMINE 529 MG/ML IV SOLN
20.0000 mL | Freq: Once | INTRAVENOUS | Status: AC | PRN
  Administered 2020-04-25: 20 mL via INTRAVENOUS

## 2020-04-26 ENCOUNTER — Other Ambulatory Visit: Payer: Self-pay | Admitting: Internal Medicine

## 2020-04-26 DIAGNOSIS — N182 Chronic kidney disease, stage 2 (mild): Secondary | ICD-10-CM

## 2020-04-26 DIAGNOSIS — E1122 Type 2 diabetes mellitus with diabetic chronic kidney disease: Secondary | ICD-10-CM

## 2020-04-27 ENCOUNTER — Other Ambulatory Visit: Payer: Self-pay | Admitting: Adult Health

## 2020-04-27 ENCOUNTER — Encounter: Payer: Self-pay | Admitting: Adult Health

## 2020-04-27 DIAGNOSIS — K862 Cyst of pancreas: Secondary | ICD-10-CM

## 2020-04-30 ENCOUNTER — Other Ambulatory Visit: Payer: Self-pay

## 2020-04-30 ENCOUNTER — Ambulatory Visit: Payer: Medicare Other | Admitting: Adult Health

## 2020-04-30 ENCOUNTER — Encounter: Payer: Self-pay | Admitting: Adult Health

## 2020-04-30 VITALS — BP 124/74 | HR 68 | Temp 97.9°F | Ht 70.0 in | Wt 214.0 lb

## 2020-04-30 DIAGNOSIS — K219 Gastro-esophageal reflux disease without esophagitis: Secondary | ICD-10-CM

## 2020-04-30 DIAGNOSIS — E1169 Type 2 diabetes mellitus with other specified complication: Secondary | ICD-10-CM

## 2020-04-30 DIAGNOSIS — I723 Aneurysm of iliac artery: Secondary | ICD-10-CM | POA: Diagnosis not present

## 2020-04-30 DIAGNOSIS — N401 Enlarged prostate with lower urinary tract symptoms: Secondary | ICD-10-CM

## 2020-04-30 DIAGNOSIS — E1122 Type 2 diabetes mellitus with diabetic chronic kidney disease: Secondary | ICD-10-CM | POA: Diagnosis not present

## 2020-04-30 DIAGNOSIS — E559 Vitamin D deficiency, unspecified: Secondary | ICD-10-CM

## 2020-04-30 DIAGNOSIS — N138 Other obstructive and reflux uropathy: Secondary | ICD-10-CM

## 2020-04-30 DIAGNOSIS — R0989 Other specified symptoms and signs involving the circulatory and respiratory systems: Secondary | ICD-10-CM

## 2020-04-30 DIAGNOSIS — K8020 Calculus of gallbladder without cholecystitis without obstruction: Secondary | ICD-10-CM

## 2020-04-30 DIAGNOSIS — K760 Fatty (change of) liver, not elsewhere classified: Secondary | ICD-10-CM

## 2020-04-30 DIAGNOSIS — N183 Chronic kidney disease, stage 3 unspecified: Secondary | ICD-10-CM

## 2020-04-30 DIAGNOSIS — I700 Atherosclerosis of aorta: Secondary | ICD-10-CM | POA: Diagnosis not present

## 2020-04-30 DIAGNOSIS — E66811 Obesity, class 1: Secondary | ICD-10-CM

## 2020-04-30 DIAGNOSIS — E785 Hyperlipidemia, unspecified: Secondary | ICD-10-CM

## 2020-04-30 DIAGNOSIS — Z86718 Personal history of other venous thrombosis and embolism: Secondary | ICD-10-CM

## 2020-04-30 DIAGNOSIS — E669 Obesity, unspecified: Secondary | ICD-10-CM

## 2020-04-30 DIAGNOSIS — N1832 Chronic kidney disease, stage 3b: Secondary | ICD-10-CM

## 2020-04-30 DIAGNOSIS — Z0000 Encounter for general adult medical examination without abnormal findings: Secondary | ICD-10-CM

## 2020-04-30 DIAGNOSIS — E1142 Type 2 diabetes mellitus with diabetic polyneuropathy: Secondary | ICD-10-CM

## 2020-04-30 DIAGNOSIS — K862 Cyst of pancreas: Secondary | ICD-10-CM

## 2020-04-30 DIAGNOSIS — Z79899 Other long term (current) drug therapy: Secondary | ICD-10-CM

## 2020-04-30 DIAGNOSIS — D696 Thrombocytopenia, unspecified: Secondary | ICD-10-CM

## 2020-04-30 MED ORDER — OZEMPIC (1 MG/DOSE) 4 MG/3ML ~~LOC~~ SOPN: 1.0000 mg | PEN_INJECTOR | SUBCUTANEOUS | 1 refills | Status: DC

## 2020-04-30 NOTE — Progress Notes (Signed)
MEDICARE ANNUAL WELLNESS VISIT AND FOLLOW UP Assessment:   Encounter for Medicare annual wellness exam 1 year Check with VA/insurance about shingrix coverage  Atherosclerosis of aorta ([place]) Control blood pressure, cholesterol, glucose, increase exercise.   Essential hypertension - continue medications, DASH diet, exercise and monitor at home. Call if greater than 130/80.  -     CBC with Differential/Platelet -     COMPLETE METABOLIC PANEL WITH GFR -     TSH  Aneurysm of left iliac artery (HCC) Control blood pressure, cholesterol, glucose, increase exercise.   CKD stage 3 due to type 2 diabetes mellitus (HCC) Increase fluids, avoid NSAIDS, monitor sugars, will monitor  Type 2 diabetes mellitus with stage 3b chronic kidney disease, without long-term current use of insulin [facility]) Education: Reviewed 'ABCs' of diabetes management (respective goals in parentheses):  A1C (<7), blood pressure (<130/80), and cholesterol (LDL <70) Eye Exam yearly and Dental Exam every 6 months. Dietary recommendations Physical Activity recommendations TRY TO ADD ON WALKING, work up to 30 min daily  Increase ozempic to 1 mg weekly; call if glucose not improving in 2 weeks  Diabetic polyneuropathy associated with type 2 diabetes mellitus (HCC) Control sugars Continue follow up with poditrist  Hyperlipidemia associated with type 2 diabetes mellitus ([place]) Continue medications LDL goal <70 Continue low cholesterol diet and exercise.   Gastroesophageal reflux disease, unspecified whether esophagitis present Continue PPI/H2 blocker, diet discussed  Obesity (BMI 30.0-34.9) - follow up 3 months for progress monitoring - increase veggies, decrease carbs - long discussion about weight loss, diet, and exercise  Thrombocytopenia (HCC) Will monitor  Vitamin D deficiency Continue supplement  GERD Well managed on current medications Discussed diet, avoiding triggers and other lifestyle changes  Hepatic  steatosis Weight loss advised, avoid alcohol/tylenol, will monitor LFTs  Gallstones Monitor; denies current sx  Pancreas cyst 6 month follow up MRI is ordered and expected 10/25/2020   Over 30 minutes of exam, counseling, chart review, and critical decision making was performed  Future Appointments  Date Time Provider [place]  07/09/2020  4:00 PM Unk Pinto, MD [facility] None  10/08/2020  2:30 PM Garnet Sierras, NP [facility] None  01/15/2021 11:00 AM Unk Pinto, MD [facility] None  05/03/2021  2:30 PM Liane Comber, NP [facility] None     Plan:   During the course of the visit the patient was educated and counseled about appropriate screening and preventive services including:    Pneumococcal vaccine   Influenza vaccine  Prevnar 13  Td vaccine  Screening electrocardiogram  Colorectal cancer screening  Diabetes screening  Glaucoma screening  Nutrition counseling    Subjective:  Joshua Peterson is a 80 y.o. male who presents for Medicare Annual Wellness Visit and 1 month follow up for poorly controlled T2DM.   Follows with Dr. Karsten Ro (going to retire per patient) for kidney stones.   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   His blood pressure has been controlled at home, today their BP is BP: 124/74 He does not workout. He denies chest pain, shortness of breath, dizziness.   BMI is Body mass index  is 30.71 kg/m., he is working on diet, admits hasn't been walking much, typically does well  when he does. Planning to buy a treadmill. Wt Readings from Last 3 Encounters:  04/30/20 214 lb (97.1 kg)  04/02/20 220 lb (99.8 kg)  03/20/20 205 lb (93 kg)   He has history of recurrent DVTs with travel, had IVC filter  He has aortic atherosclerosis per Ct 2018.   He is on cholesterol medication and denies myalgias. His cholesterol is not at goal. The cholesterol last visit was:   Lab Results  Component Value Date   CHOL 133 04/02/2020   HDL 24 (L) 04/02/2020   LDLCALC 63 04/02/2020   TRIG 379 (H) 04/02/2020   CHOLHDL 5.5 (H) 04/02/2020   He admits hasn't been working on diet and exercise for Diabetes  with diabetic chronic kidney disease he is on losartan 100mg  Hyperlipidemia on crestor 5 mg not at goal less than 70, on tricor.  he is on metformin 4 a day He was prescribed ozempic, has done 0.25 mg weekly and tolerated well, has been taking 0.5 mg weekly, tolerating well but not noting much glucose improvement  Cinnamon  Glucotrol 10 mg 3 x a day right after eating DEE 06/2019 no retinopathy He did not bring glucose log; reports recent fasting has been 200+ and denies paresthesia of the feet, polydipsia, polyuria and visual disturbances.  Last A1C was:  Lab Results  Component Value Date   HGBA1C 8.4 (H) 04/02/2020   Last GFR Lab Results  Component Value Date   GFRNONAA 43 (L) 04/02/2020    Patient is on Vitamin D supplement.   Lab Results  Component Value Date   VD25OH 57 12/16/2019   Medication Review:  Current Outpatient Medications (Endocrine & Metabolic):  .  glipiZIDE (GLUCOTROL) 10 MG tablet, TAKE 1 TABLET BY MOUTH 3 TIMES A DAY WITH MEALS FOR DIABETES .  metFORMIN (GLUCOPHAGE-XR) 500 MG 24 hr tablet, TAKE 2 TABLETS TWICE A DAY WITH MEALS FOR DIABETES .  Semaglutide, 1 MG/DOSE, (OZEMPIC, 1 MG/DOSE,) 4 MG/3ML SOPN, Inject 1 mg into the skin once a week.  Current Outpatient Medications (Cardiovascular):  .  fenofibrate (TRICOR) 145 MG tablet, TAKE 1 TABLET  DAILY FOR TRIGLYCERIDES (BLOOD FATS) .  losartan (COZAAR) 100 MG tablet, TAKE 1 TABLET DAILY FOR BLOOD PRESSURE .  rosuvastatin (CRESTOR) 5 MG tablet, Take    1 tablet    Daily      for Cholesterol  Current Outpatient Medications (Respiratory):  .  albuterol (VENTOLIN HFA) 108 (90 Base) MCG/ACT inhaler,  .  fluticasone (FLONASE) 50 MCG/ACT nasal spray, Place 2 sprays into both nostrils daily. Marland Kitchen  PROAIR HFA 108 (90 Base) MCG/ACT inhaler,   Current Outpatient Medications (Analgesics):  .  aspirin 81 MG tablet, Take 81 mg by mouth daily.   Current Outpatient Medications (Other):  Marland Kitchen  Cholecalciferol (VITAMIN D3) 5000 units CAPS, Take 2 capsules (10,000 Units total) by mouth daily. (Patient taking differently: Take 2 capsules by mouth daily.) .  Chromium-Cinnamon (CINNAMON PLUS CHROMIUM) [phone] MCG-MG CAPS, Take 2 capsules by mouth daily. .  famotidine (PEPCID) 40 MG tablet, Take 40 mg by mouth as needed.  Marland Kitchen  glucose blood (ONETOUCH ULTRA) test strip, Test blood sugar once daily for diabetes .  Multiple Vitamins-Minerals (MEGA MULTIVITAMIN FOR MEN) TABS, Take 1 tablet by mouth daily. .  Potassium Citrate 15 MEQ (1620 MG) TBCR, Take 1 tablet 2 x /day to Prevent Kidney Stones  Allergies: Allergies  Allergen Reactions  . Doxycycline     unknown  . Niacin And Related Other (See Comments)    flushing  . Sulfa Antibiotics Itching    Current Problems (verified) has History of DVT of lower extremity; CKD stage 3 due to type 2 diabetes mellitus ([place]); Labile hypertension; Hyperlipidemia associated with type 2 diabetes mellitus ([place]); Vitamin D deficiency; Obesity (BMI 30.0-34.9); Medication management; Gastroesophageal reflux disease; Thrombocytopenia ([place]); Aneurysm of left iliac artery (HCC); Gallstones; Depression; Type 2 diabetes mellitus with stage 3 chronic kidney disease, without long-term current use of insulin ([place]); BPH with obstruction/lower urinary tract symptoms; Diabetic  polyneuropathy associated with type 2 diabetes mellitus ([place]); Aortic atherosclerosis ([place]) by CT scan 2018; and Pancreas cyst on their problem list.  Screening Tests Immunization History  Administered Date(s) Administered  . Fluad Quad(high Dose 65+) 11/23/2018  . Influenza, High Dose Seasonal PF 01/14/2015, 11/26/2015, 12/29/2016, 12/18/2017  . Moderna Sars-Covid-2 Vaccination 04/04/2019, 05/02/2019  . Pneumococcal Conjugate-13 01/14/2015  . Pneumococcal-Unspecified 04/02/2008  . Td 04/02/2006, 12/29/2016  . Zoster 04/03/2007   Preventative care: Last colonoscopy: 10/2017 Dr. Havery Moros, polyps due for 3 year follow up this fall   TDAP 2018 CXR 2017  Influenza: 01/2020 Pneumonia: 2/2 Tetanus 2018 Shingles 2009 Covid 19: 3/3, moderna - requested information for booster   Names of Other Physician/Practitioners you currently use: 1. [facility] here for primary care 2. Dr. Delman Cheadle, eye doctor, last visit 06/2019 3. Dr. Mariea Clonts, dentist, last visit 2021  Patient Care Team: Unk Pinto, MD as PCP - General (Internal Medicine) Kathie Rhodes, MD (Inactive) as Consulting Physician (Urology)  Surgical: He  has a past surgical history that includes RIGHT DEEP CERVICAL NODE BIOPSY (01/03/2001); RIGHT CHEEK/MOUTH BIOPSY (09/16/2008  (office)); Colonoscopy (last one 10-25-2017); IVC FILTER INSERTION (11/21/2007); IVC FILTER REMOVAL (01/31/2008); Tonsillectomy (age 37); Orchiopexy (Bilateral, 1950's); Cystoscopy with litholapaxy (N/A, 07/18/2016); and Cystoscopy with litholapaxy (N/A, 10/15/2018). Family His family history includes Alzheimer's disease in his mother; Breast cancer in his sister; Diabetes in his mother; Prostate cancer in his father. Social history  He reports that he quit smoking about 29 years ago. His smoking use included cigarettes. He has a 20.00 pack-year smoking history. He has never used smokeless tobacco. He reports current alcohol  use. He reports that he does not use drugs.  MEDICARE WELLNESS OBJECTIVES: Physical activity: Current Exercise Habits: The patient does not participate in regular exercise at present, Exercise limited by: None identified Cardiac risk factors: Cardiac Risk Factors include: advanced age (>13men, >85 women);dyslipidemia;hypertension;male gender;obesity (BMI >30kg/m2);diabetes mellitus;sedentary lifestyle;smoking/ tobacco exposure Depression/mood screen:   Depression screen [facility] 2/9 04/30/2020  Decreased Interest 0  Down, Depressed, Hopeless 0  PHQ - 2 Score 0  Altered sleeping 0  Tired, decreased energy 0  Change in appetite 0  Feeling bad or failure about yourself  0  Trouble concentrating 0  Moving slowly or fidgety/restless 0  Suicidal thoughts 0  PHQ-9 Score 0  Difficult doing work/chores Not difficult at all    ADLs:  In your present state of health, do you have any difficulty performing the following activities: 04/30/2020 12/15/2019  Hearing? N N  Comment has hearing aid -  Vision? N N  Difficulty concentrating or making decisions? N N  Walking or climbing stairs? N N  Dressing or bathing? N N  Doing errands, shopping? N N  Some recent data might be hidden     Cognitive Testing  Alert? Yes  Normal Appearance?Yes  Oriented to  person? Yes  Place? Yes   Time? Yes  Recall of three objects?  Yes  Can perform simple calculations? Yes  Displays appropriate judgment?Yes  Can read the correct time from a watch face?Yes  EOL planning: Does Patient Have a Medical Advance Directive?: Yes Type of Advance Directive: [place] will Does patient want to make changes to medical advance directive?: No - Patient declined Copy of [place] in Chart?: No - copy requested   Objective:   Today's Vitals   04/30/20 1352  BP: 124/74  Pulse: 68  Temp: 97.9 F (36.6 C)  SpO2: 98%  Weight: 214 lb (97.1 kg)  Height: 5\' 10"  (1.778 m)   Body  mass index is 30.71 kg/m.  Wt Readings from Last 3 Encounters:  04/30/20 214 lb (97.1 kg)  04/02/20 220 lb (99.8 kg)  03/20/20 205 lb (93 kg)     General appearance: alert, no distress, WD/WN, male obese HEENT: normocephalic, sclerae anicteric, TMs pearly, nares patent, no discharge or erythema, pharynx normal.  Left eye with external hordeoleum.  No active drainage or discharge.  No conjunctival injections Oral cavity: MMM, no lesions Neck: supple, no lymphadenopathy, no thyromegaly, no masses Heart: RRR, normal S1, S2, no murmurs Lungs: CTA bilaterally, no wheezes, rhonchi, or rales Abdomen: +bs, soft, obese, non tender, non distended, no masses, no hepatomegaly, no splenomegaly Musculoskeletal: nontender, no swelling, no obvious deformity Extremities: no edema, no cyanosis, no clubbing Pulses: 2+ symmetric, upper and lower extremities, normal cap refill Neurological: alert, oriented x 3, CN2-12 intact, strength normal upper extremities and lower extremities, sensation decreased bilateral feet, DTRs 2+ throughout, no cerebellar signs, gait normal Psychiatric: normal affect, behavior normal, pleasant   Medicare Attestation I have personally reviewed: The patient's medical and social history Their use of alcohol, tobacco or illicit drugs Their current medications and supplements The patient's functional ability including ADLs,fall risks, home safety risks, cognitive, and hearing and visual impairment Diet and physical activities Evidence for depression or mood disorders  The patient's weight, height, BMI, and visual acuity have been recorded in the chart.  I have made referrals, counseling, and provided education to the patient based on review of the above and I have provided the patient with a written personalized care plan for preventive services.     Joshua Ribas, NP   04/30/2020

## 2020-04-30 NOTE — Patient Instructions (Addendum)
Mr. Wiberg , Thank you for taking time to come for your Medicare Wellness Visit. I appreciate your ongoing commitment to your health goals. Please review the following plan we discussed and let me know if I can assist you in the future.   These are the goals we discussed: Goals    . Exercise 150 min/wk Moderate Activity    . Fasting Blood Glucose <130    . Weight (lb) < 200 lb (90.7 kg)       This is a list of the screening recommended for you and due dates:  Health Maintenance  Topic Date Due  .  Hepatitis C: One time screening is recommended by Center for Disease Control  (CDC) for  adults born from [birth year] through 1965.   Never done  . Eye exam for diabetics  06/26/2020  . Hemoglobin A1C  09/30/2020  . Colon Cancer Screening  10/25/2020  . Complete foot exam   12/14/2020  . Tetanus Vaccine  12/30/2026  . Flu Shot  Completed  . COVID-19 Vaccine  Completed  . Pneumonia vaccines  Completed    Ask the VA/ insurance if they will cover shingrix -    Please let me know if glucose isn't improving in 2 weeks - may try trulicity   Start walking 30 min daily    Zoster Vaccine, Recombinant injection What is this medicine? ZOSTER VACCINE (ZOS ter vak SEEN) is a vaccine used to reduce the risk of getting shingles. This vaccine is not used to treat shingles or nerve pain from shingles. This medicine may be used for other purposes; ask your health care provider or pharmacist if you have questions. COMMON BRAND NAME(S): [facility] What should I tell my health care provider before I take this medicine? They need to know if you have any of these conditions:  cancer  immune system problems  an unusual or allergic reaction to Zoster vaccine, other medications, foods, dyes, or preservatives  pregnant or trying to get pregnant  breast-feeding How should I use this medicine? This vaccine is injected into a muscle. It is given by a health care provider. A copy of Vaccine Information  Statements will be given before each vaccination. Be sure to read this information carefully each time. This sheet may change often. Talk to your health care provider about the use of this vaccine in children. This vaccine is not approved for use in children. Overdosage: If you think you have taken too much of this medicine contact a poison control center or emergency room at once. NOTE: This medicine is only for you. Do not share this medicine with others. What if I miss a dose? Keep appointments for follow-up (booster) doses. It is important not to miss your dose. Call your health care provider if you are unable to keep an appointment. What may interact with this medicine?  medicines that suppress your immune system  medicines to treat cancer  steroid medicines like prednisone or cortisone This list may not describe all possible interactions. Give your health care provider a list of all the medicines, herbs, non-prescription drugs, or dietary supplements you use. Also tell them if you smoke, drink alcohol, or use illegal drugs. Some items may interact with your medicine. What should I watch for while using this medicine? Visit your health care provider regularly. This vaccine, like all vaccines, may not fully protect everyone. What side effects may I notice from receiving this medicine? Side effects that you should report to your doctor  or health care professional as soon as possible:  allergic reactions (skin rash, itching or hives; swelling of the face, lips, or tongue)  trouble breathing Side effects that usually do not require medical attention (report these to your doctor or health care professional if they continue or are bothersome):  chills  headache  fever  nausea  pain, redness, or irritation at site where injected  tiredness  vomiting This list may not describe all possible side effects. Call your doctor for medical advice about side effects. You may report side  effects to FDA at 1-800-FDA-1088. Where should I keep my medicine? This vaccine is only given by a health care provider. It will not be stored at home. NOTE: This sheet is a summary. It may not cover all possible information. If you have questions about this medicine, talk to your doctor, pharmacist, or health care provider.  2021 Elsevier/Gold Standard (2019-04-12 16:23:07)

## 2020-06-16 ENCOUNTER — Other Ambulatory Visit: Payer: Self-pay | Admitting: Internal Medicine

## 2020-06-24 ENCOUNTER — Ambulatory Visit: Payer: Medicare Other | Admitting: Internal Medicine

## 2020-07-09 ENCOUNTER — Ambulatory Visit: Payer: Medicare Other | Admitting: Internal Medicine

## 2020-07-09 ENCOUNTER — Other Ambulatory Visit: Payer: Self-pay

## 2020-07-09 VITALS — BP 122/66 | HR 85 | Temp 97.2°F | Resp 16 | Ht 71.0 in | Wt 210.0 lb

## 2020-07-09 DIAGNOSIS — E1142 Type 2 diabetes mellitus with diabetic polyneuropathy: Secondary | ICD-10-CM

## 2020-07-09 DIAGNOSIS — E785 Hyperlipidemia, unspecified: Secondary | ICD-10-CM

## 2020-07-09 DIAGNOSIS — N1832 Chronic kidney disease, stage 3b: Secondary | ICD-10-CM

## 2020-07-09 DIAGNOSIS — E1169 Type 2 diabetes mellitus with other specified complication: Secondary | ICD-10-CM | POA: Diagnosis not present

## 2020-07-09 DIAGNOSIS — E1122 Type 2 diabetes mellitus with diabetic chronic kidney disease: Secondary | ICD-10-CM | POA: Diagnosis not present

## 2020-07-09 DIAGNOSIS — I700 Atherosclerosis of aorta: Secondary | ICD-10-CM

## 2020-07-09 DIAGNOSIS — I10 Essential (primary) hypertension: Secondary | ICD-10-CM

## 2020-07-09 DIAGNOSIS — Z79899 Other long term (current) drug therapy: Secondary | ICD-10-CM

## 2020-07-09 DIAGNOSIS — E559 Vitamin D deficiency, unspecified: Secondary | ICD-10-CM | POA: Diagnosis not present

## 2020-07-09 NOTE — Patient Instructions (Signed)

## 2020-07-09 NOTE — Progress Notes (Signed)
Future Appointments  Date Time Provider [place]  07/09/2020  4:00 PM Unk Pinto, MD [facility] None  10/08/2020  2:30 PM Garnet Sierras, NP [facility] None  01/15/2021 11:00 AM Unk Pinto, MD [facility] None  05/03/2021  2:30 PM Liane Comber, NP [facility] None    History of Present Illness:       This very nice 80 y.o. MWM presents for 6  month follow up with HTN, HLD, Pre-Diabetes and Vitamin D Deficiency.        Patient is treated for HTN & BP has been controlled at home. Today's BP is at goal -  122/66. Patient has had no complaints of any cardiac type chest pain, palpitations, dyspnea / orthopnea / PND, dizziness, claudication, or dependent edema.       Hyperlipidemia is controlled with diet & meds. Patient denies myalgias or other med SE's. Last Lipids were at goal except elevated Trig's.   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  Last A1c was not at goal:  Lab Results  Component Value Date   HGBA1C 8.4 (H) 04/02/2020            Further, the patient also has history of Vitamin D Deficiency and supplements vitamin D without any suspected side-effects. Last vitamin D was at goal:  Lab Results  Component Value Date   VD25OH 57 12/16/2019    Current Outpatient Medications on File Prior to Visit  Medication Sig  . albuterol  HFA  inhaler   . aspirin 81 MG tablet Take daily.  Marland Kitchen VITAMIN D  5000 units\S Take 2 capsules daily.   Marland Kitchen CINNAMON+CHROMIUM [phone] mcg-mg  Take 2 capsules  daily.  . famotidine ( 40 MG tablet Take  as needed.   . fenofibrate 145 MG tablet TAKE 1 TABLET DAILY   . FLONASE  nasal spray Place 2 sprays into both nostrils daily.  Marland Kitchen  glipiZIDE  10 MG tablet TAKE 1 TABLET BY MOUTH 3 TIMES A DAY WITH MEALS FOR DIABETES  . losartan  100 MG tablet TAKE 1 TABLET DAILY   . metFORMIN-XR 500 MG  TAKE 2 TABLETS TWICE A DAY   . Multiple Vitamins-Minerals  Take 1 tablet  daily.  . Potassium Citrate 15 MEQ Take 1 tablet 2 x /day   . PROAIR HFA  inhaler   . rosuvastatin 5 MG tablet TAKE 1 TABLET EVERY DAY  . Semaglutide/OZEMPIC Inject 1 mg into the skin once a week.     Allergies  Allergen Reactions  . Doxycycline unknown  . Niacin And Related flushing  . Sulfa Antibiotics Itching    PMHx:   Past Medical History:  Diagnosis Date  . Allergic rhinitis   . Bladder stones   . CKD (chronic kidney disease), stage III ([place])   . Common iliac aneurysm (HCC) right side   followed by vascular-- dr Trula Slade (note in epic)  . Environmental and seasonal allergies   . External carotid artery stenosis     per last duplex 05-18-2011  right moderate stenosis and left moderate to severe  . Hematuria   . History of adenomatous polyp of colon  tubular adenoma's  . History of bladder stone   . History of DVT in adulthood    08/ 2009 right lower extremity;  2013  left lower extremity  (10-11-2018 per pt no dvt since 2013)  . History of kidney stones   . History of pulmonary embolism    bilatera lung  08/ 2009  (10-11-2018  per pt no pe since 2009)  . History of wheezing    allergies--- prn inhaler  . Hypertension   . Hypogonadism male   . Internal carotid artery stenosis, bilateral     per last duplex 05-18-2011 in epic,   bilateral 40-59% stenosis  (10-11-2018  per pt denies stroke/tia S&S)  . Lower urinary tract symptoms (LUTS)   . Mixed hyperlipidemia   . Nephrolithiasis   . Testosterone deficiency   . Type 2 diabetes mellitus ([place])   . Vitamin D deficiency   . Wears glasses     Immunization History  Administered Date(s) Administered  . Fluad Quad(high Dose 65+) 11/23/2018  . Influenza, High Dose Seasonal PF 01/14/2015,  11/26/2015, 12/29/2016, 12/18/2017  . Moderna Sars-Covid-2 Vaccination 04/04/2019, 05/02/2019  . Pneumococcal Conjugate-13 01/14/2015  . Pneumococcal-Unspecified 04/02/2008  . Td 04/02/2006, 12/29/2016  . Zoster 04/03/2007    Past Surgical History:  Procedure Laterality Date  . COLONOSCOPY  last one 10-25-2017  . CYSTOSCOPY WITH LITHOLAPAXY N/A 07/18/2016   Procedure: CYSTOSCOPY WITH LITHOLAPAXY;  Surgeon: Kathie Rhodes, MD;  Location: [facility];  Service: Urology;  Laterality: N/A;  . CYSTOSCOPY WITH LITHOLAPAXY N/A 10/15/2018   Procedure: CYSTOSCOPY WITH LITHOLAPAXY;  Surgeon: Kathie Rhodes, MD;  Location: [facility];  Service: Urology;  Laterality: N/A;  . IVC FILTER INSERTION  11/21/2007  . IVC FILTER REMOVAL  01/31/2008  . 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 RIGHT CHEEK/MOUTH BIOPSY  09/16/2008  (office)   squamous hyperplasia w/ hyperkeratosis  . RIGHT DEEP CERVICAL NODE BIOPSY  01/03/2001   right posterior neck (per path report-- lymphoid hyperplasia w/ florid follicular hyperplasia, no malignancy(  . TONSILLECTOMY  age 60    FHx:    Reviewed / unchanged  SHx:    Reviewed / unchanged   Systems Review:  Constitutional: Denies fever, chills, wt changes, headaches, insomnia, fatigue, night sweats, change in appetite. Eyes: Denies redness, blurred vision, diplopia, discharge, itchy, watery eyes.  ENT: Denies discharge, congestion, post nasal drip, epistaxis, sore throat, earache, hearing loss, dental pain, tinnitus, vertigo, sinus pain, snoring.  CV: Denies chest pain, palpitations, irregular heartbeat, syncope, dyspnea, diaphoresis, orthopnea, PND, claudication or edema. Respiratory: denies cough, dyspnea, DOE, pleurisy, hoarseness, laryngitis, wheezing.  Gastrointestinal: Denies dysphagia, odynophagia, heartburn, reflux, water brash, abdominal pain or cramps, nausea, vomiting, bloating, diarrhea, constipation, hematemesis,  melena, hematochezia  or hemorrhoids. Genitourinary: Denies dysuria, frequency, urgency, nocturia, hesitancy, discharge, hematuria or flank pain. Musculoskeletal: Denies arthralgias, myalgias, stiffness, jt. swelling, pain, limping or strain/sprain.  Skin: Denies pruritus, rash, hives, warts, acne, eczema or change in skin lesion(s). Neuro: No weakness, tremor, incoordination, spasms, paresthesia or pain. Psychiatric: Denies confusion, memory loss or sensory loss. Endo: Denies change in weight, skin or hair change.  Heme/Lymph: No excessive bleeding, bruising or enlarged lymph nodes.  Physical Exam  BP 122/66   Pulse 85   Temp (!) 97.2 F (36.2 C)   Resp 16   Ht 5\' 11"  (1.803 m)   Wt 210 lb (95.3 kg)   SpO2 98%   BMI 29.29 kg/m   Appears  well nourished,  well groomed  and in no distress.  Eyes: PERRLA, EOMs, conjunctiva no swelling or erythema. Sinuses: No frontal/maxillary tenderness ENT/Mouth: EAC's clear, TM's nl w/o erythema, bulging. Nares clear w/o erythema, swelling, exudates. Oropharynx clear without erythema or exudates. Oral hygiene is good. Tongue normal, non obstructing. Hearing intact.  Neck: Supple. Thyroid not palpable. Car 2+/2+ without bruits, nodes or JVD. Chest: Respirations nl with BS clear & equal w/o rales, rhonchi, wheezing or stridor.  Cor: Heart sounds normal w/ regular rate and rhythm without sig. murmurs, gallops, clicks or rubs. Peripheral pulses normal and equal  without edema.  Abdomen: Soft & bowel sounds normal. Non-tender w/o guarding, rebound, hernias, masses or organomegaly.  Lymphatics: Unremarkable.  Musculoskeletal: Full ROM all peripheral extremities, joint stability, 5/5 strength and normal gait.  Skin: Warm, dry without exposed rashes, lesions or ecchymosis apparent.  Neuro: Cranial nerves intact, reflexes equal bilaterally. Sensory-motor testing grossly intact. Tendon reflexes grossly intact.  Pysch: Alert & oriented x 3.  Insight and  judgement nl & appropriate. No ideations.  Assessment and Plan:  1. Essential hypertension  - Continue medication, monitor blood pressure at home.  - Continue DASH diet.  Reminder to go to the ER if any CP,  SOB, nausea, dizziness, severe HA, changes vision/speech.  - CBC with Differential/Platelet - COMPLETE METABOLIC PANEL WITH GFR - Magnesium  2. Hyperlipidemia associated with type 2 diabetes mellitus ([place])  - Continue diet/meds, exercise,& lifestyle modifications.  - Continue monitor periodic cholesterol/liver & renal functions   - Lipid panel - TSH  3. Type 2 diabetes mellitus with stage 3b chronic kidney  disease, without long-term current use of insulin (HCC)  - Continue diet, exercise  - Lifestyle modifications.  - Monitor appropriate labs  - Hemoglobin A1c - Insulin, random  4. Vitamin D deficiency  - Continue supplementation.  - VITAMIN D 25 Hydroxy 5. Aortic atherosclerosis ([place]) by CT scan 2018  - Lipid panel  6. Diabetic polyneuropathy associated with type 2 diabetes mellitus (HCC)  - Hemoglobin A1c  7. Medication management  - CBC with Differential/Platelet - COMPLETE METABOLIC PANEL WITH GFR - Magnesium - Lipid panel - TSH - Hemoglobin A1c - Insulin, random - VITAMIN D 25 Hydroxyl       Discussed  regular exercise, BP monitoring, weight control to achieve/maintain BMI less than 25 and discussed med and SE's. Recommended labs to assess and monitor clinical status with further disposition pending results of labs.  I discussed the assessment and treatment plan with the patient. The patient was provided an opportunity to ask questions and all were answered. The patient agreed with the plan and demonstrated an understanding of the instructions.  I provided over 30 minutes of exam, counseling, chart review and  complex critical decision making.       The patient was advised to call back or seek an in-person evaluation if the symptoms worsen or if the  condition fails to improve as anticipated.   Kirtland Bouchard, MD

## 2020-07-10 LAB — TSH: TSH: 1.49 mIU/L (ref 0.40–4.50)

## 2020-07-10 LAB — COMPLETE METABOLIC PANEL WITH GFR
AG Ratio: 1.8 (calc) (ref 1.0–2.5)
ALT: 17 U/L (ref 9–46)
AST: 13 U/L (ref 10–35)
Albumin: 4.4 g/dL (ref 3.6–5.1)
Alkaline phosphatase (APISO): 61 U/L (ref 35–144)
BUN/Creatinine Ratio: 18 (calc) (ref 6–22)
BUN: 24 mg/dL (ref 7–25)
CO2: 24 mmol/L (ref 20–32)
Calcium: 9.8 mg/dL (ref 8.6–10.3)
Chloride: 106 mmol/L (ref 98–110)
Creat: 1.3 mg/dL — ABNORMAL HIGH (ref 0.70–1.18)
GFR, Est African American: 60 mL/min/{1.73_m2} (ref >=60)
GFR, Est Non African American: 52 mL/min/{1.73_m2} — ABNORMAL LOW (ref >=60)
Globulin: 2.5 g/dL (calc) (ref 1.9–3.7)
Glucose, Bld: 225 mg/dL — ABNORMAL HIGH (ref 65–99)
Potassium: 4.4 mmol/L (ref 3.5–5.3)
Sodium: 140 mmol/L (ref 135–146)
Total Bilirubin: 0.7 mg/dL (ref 0.2–1.2)
Total Protein: 6.9 g/dL (ref 6.1–8.1)

## 2020-07-10 LAB — CBC WITH DIFFERENTIAL/PLATELET
Absolute Monocytes: 410 cells/uL (ref 200–950)
Basophils Absolute: 58 cells/uL (ref 0–200)
Basophils Relative: 0.8 %
Eosinophils Absolute: 180 cells/uL (ref 15–500)
Eosinophils Relative: 2.5 %
HCT: 41 % (ref 38.5–50.0)
Hemoglobin: 13.7 g/dL (ref 13.2–17.1)
Lymphs Abs: 1800 cells/uL (ref 850–3900)
MCH: 31.2 pg (ref 27.0–33.0)
MCHC: 33.4 g/dL (ref 32.0–36.0)
MCV: 93.4 fL (ref 80.0–100.0)
MPV: 9.7 fL (ref 7.5–12.5)
Monocytes Relative: 5.7 %
Neutro Abs: 4752 cells/uL (ref 1500–7800)
Neutrophils Relative %: 66 %
Platelets: 128 10*3/uL — ABNORMAL LOW (ref 140–400)
RBC: 4.39 10*6/uL (ref 4.20–5.80)
RDW: 13.1 % (ref 11.0–15.0)
Total Lymphocyte: 25 %
WBC: 7.2 10*3/uL (ref 3.8–10.8)

## 2020-07-10 LAB — LIPID PANEL
Cholesterol: 129 mg/dL (ref <200)
HDL: 26 mg/dL — ABNORMAL LOW (ref >=40)
LDL Cholesterol (Calc): 57 mg/dL (calc)
Non-HDL Cholesterol (Calc): 103 mg/dL (calc) (ref <130)
Total CHOL/HDL Ratio: 5 (calc) — ABNORMAL HIGH (ref <5.0)
Triglycerides: 381 mg/dL — ABNORMAL HIGH (ref <150)

## 2020-07-10 LAB — VITAMIN D 25 HYDROXY (VIT D DEFICIENCY, FRACTURES): Vit D, 25-Hydroxy: 50 ng/mL (ref 30–100)

## 2020-07-10 LAB — HEMOGLOBIN A1C
Hgb A1c MFr Bld: 9.2 % of total Hgb — ABNORMAL HIGH (ref <5.7)
Mean Plasma Glucose: 217 mg/dL
eAG (mmol/L): 12 mmol/L

## 2020-07-10 LAB — INSULIN, RANDOM: Insulin: 33.2 u[IU]/mL — ABNORMAL HIGH

## 2020-07-10 LAB — MAGNESIUM: Magnesium: 1.7 mg/dL (ref 1.5–2.5)

## 2020-07-10 NOTE — Progress Notes (Signed)
============================================================ ============================================================  -  Glucose = 225 mg% - Too high - Goal is less than 140 mg%  - and   - A1c    WORSE - Gone up from 8.4%  - which was bad to Now Worse at 9.2%    Being diabetic has a  300% increased risk for heart attack,  stroke, cancer, and alzheimer- type vascular dementia.   It is very    IMPORTANT    that you work harder with diet by  avoiding all foods that are white except chicken,  fish & calliflower.  - Avoid white rice  (brown & wild rice is OK),   - Avoid white potatoes  (sweet potatoes in moderation is OK),   White bread or wheat bread or anything made out of   white flour like bagels, donuts, rolls, buns, biscuits, cakes,  - pastries, cookies, pizza crust, and pasta (made from  white flour & egg whites)   - vegetarian pasta or spinach or wheat pasta is OK.  - Multigrain breads like Arnold's, Pepperidge Farm or   multigrain sandwich thins or high fiber breads like   [place] bread or "Dave's Killer" breads that are  4 to 5 grams fiber per slice !  are best.    Diet, exercise and weight loss can reverse and cure  diabetes in the early stages.    - Diet, exercise and weight loss is very important in the   control and prevention of complications of diabetes which  affects every system in your body, ie.   -Brain - dementia/stroke,  - eyes - glaucoma/blindness,  - heart - heart attack/heart failure,  - kidneys - dialysis,  - stomach - gastric paralysis,  - intestines - malabsorption,  - nerves - severe painful neuritis,  - circulation - gangrene & loss of a leg(s)  - and finally  . . . . . . . . . . . . . . . . . .    - cancer and Alzheimers. ============================================================ ============================================================  - Total Chol = 129 and LDL Chol = 57  - Both  Excellent   - Very low risk for Heart  Attack  / Stroke ========================================================  - But Triglycerides (   381   ) or fats in blood are too high  (goal is less than 150)    - Recommend avoid fried & greasy foods,  sweets / candy,   - Avoid white rice  (brown or wild rice or Quinoa is OK),   - Avoid white potatoes  (sweet potatoes are OK)   - Avoid anything made from white flour  - bagels, doughnuts, rolls, buns, biscuits, white and   wheat breads, pizza crust and traditional  pasta made of white flour & egg white  - (vegetarian pasta or spinach or wheat pasta is OK).    - Multi-grain bread is OK - like multi-grain flat bread or  sandwich thins.   - Avoid alcohol in excess.   - Exercise is also important. ============================================================ ============================================================  - Vitamin D = 50 - is Low            ( Ideal or Goal is between 70-100 )   - Please be sure taking Vit D 10,000 units    EVERY       day  ============================================================ ============================================================  - Kidney functions are a little better   - Gone from Stage 3b  (GFR = 43) up to Stage 3a  (GFR 52)               (  GFR is kidney filtration Flow rate & higher is better )  ============================================================ ============================================================  -  All Else - CBC - Electrolytes - Liver - Magnesium & Thyroid    - all  Normal / OK ============================================================ ============================================================

## 2020-07-11 ENCOUNTER — Encounter: Payer: Self-pay | Admitting: Internal Medicine

## 2020-08-06 ENCOUNTER — Other Ambulatory Visit: Payer: Self-pay | Admitting: Adult Health Nurse Practitioner

## 2020-08-06 DIAGNOSIS — N182 Chronic kidney disease, stage 2 (mild): Secondary | ICD-10-CM

## 2020-08-20 ENCOUNTER — Ambulatory Visit: Payer: Medicare Other | Admitting: Adult Health Nurse Practitioner

## 2020-09-24 ENCOUNTER — Ambulatory Visit: Payer: Medicare Other | Admitting: Adult Health Nurse Practitioner

## 2020-10-07 ENCOUNTER — Other Ambulatory Visit: Payer: Self-pay | Admitting: Internal Medicine

## 2020-10-08 ENCOUNTER — Ambulatory Visit: Payer: Medicare Other | Admitting: Adult Health Nurse Practitioner

## 2020-10-26 ENCOUNTER — Other Ambulatory Visit: Payer: Self-pay

## 2020-10-26 ENCOUNTER — Ambulatory Visit
Admission: RE | Admit: 2020-10-26 | Discharge: 2020-10-26 | Disposition: A | Discharge status: Home / Self Care | Payer: Medicare Other | Source: Ambulatory Visit | Attending: Adult Health | Admitting: Adult Health

## 2020-10-26 DIAGNOSIS — K862 Cyst of pancreas: Secondary | ICD-10-CM

## 2020-10-26 IMAGING — MR MR ABDOMEN WO/W CM
19 of 22 series · 39 of 48 positions shown · IV contrast (multihance)
Comparison: Abdominal MRI [DATE].

CLINICAL DATA: 80-year-old male with history of cystic lesion of
the pancreas. Follow-up study.

EXAM:
MRI ABDOMEN WITHOUT AND WITH CONTRAST
TECHNIQUE: Multiplanar multisequence MR imaging of the abdomen was performed
both before and after the administration of intravenous contrast.
CONTRAST:  20mL MULTIHANCE GADOBENATE DIMEGLUMINE 529 MG/ML IV SOLN

[Series 4: T2 · axial · 5.0mm · 1.76mm/px · 1 of 38 slices shown (1 of 6)]
[im 1/38]
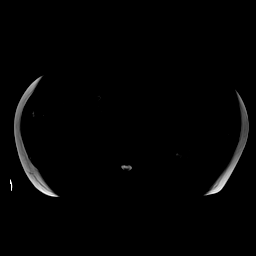

[Series 5: DWI · axial · 5.0mm · 1.68mm/px · z∈[-133,+95]mm · 3 of 117 slices shown (1 of 2)]
[im 1/117]
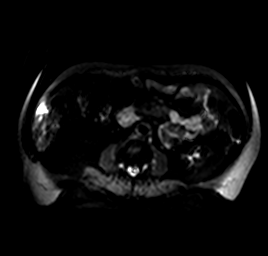
[im 59/117]
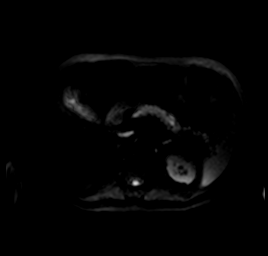
[im 117/117]
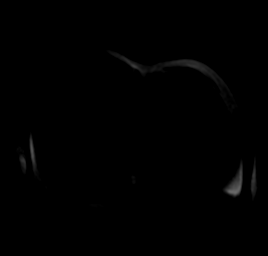

[Series 6: DWI · axial · 5.0mm · 1.68mm/px · 1 of 39 slices shown (2 of 2)]
[im 1/39]
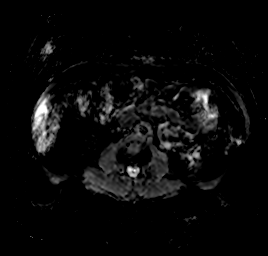

[Series 9: T2 · coronal · 5.0mm · 1.76mm/px · 1 of 36 slices shown (2 of 6)]
[im 1/36]
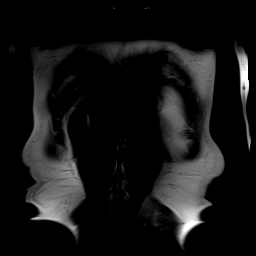

[Series 10: T2 · 1 of 6 slices shown (3 of 6)]
[im 1/6]
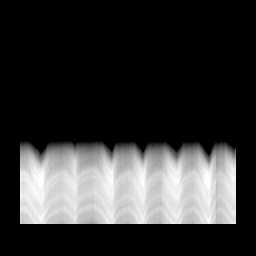

[Series 11: MRCP · coronal · 1.0mm · 0.49mm/px · 2 of 72 slices shown]
[im 1/72]
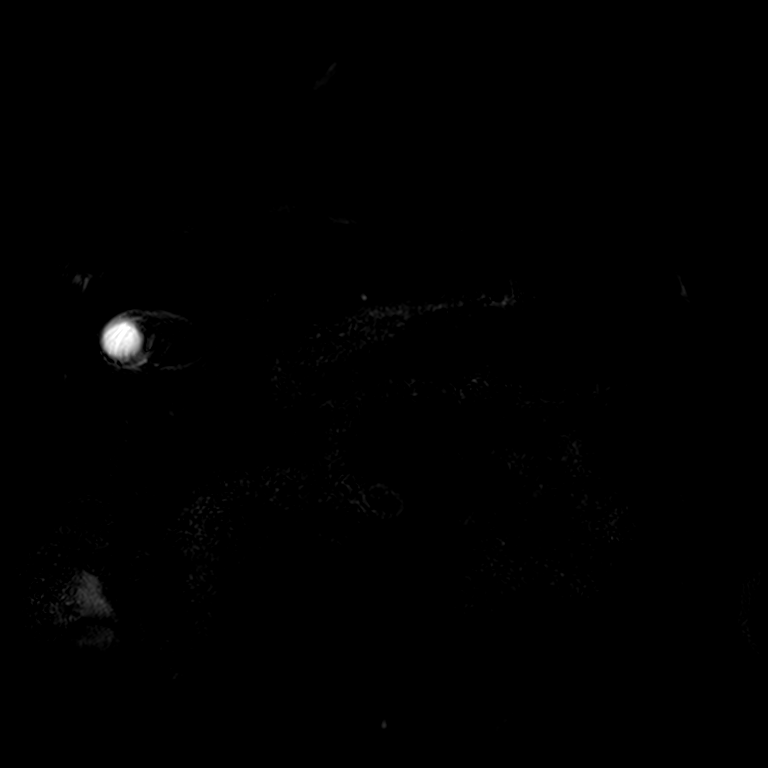
[im 72/72]
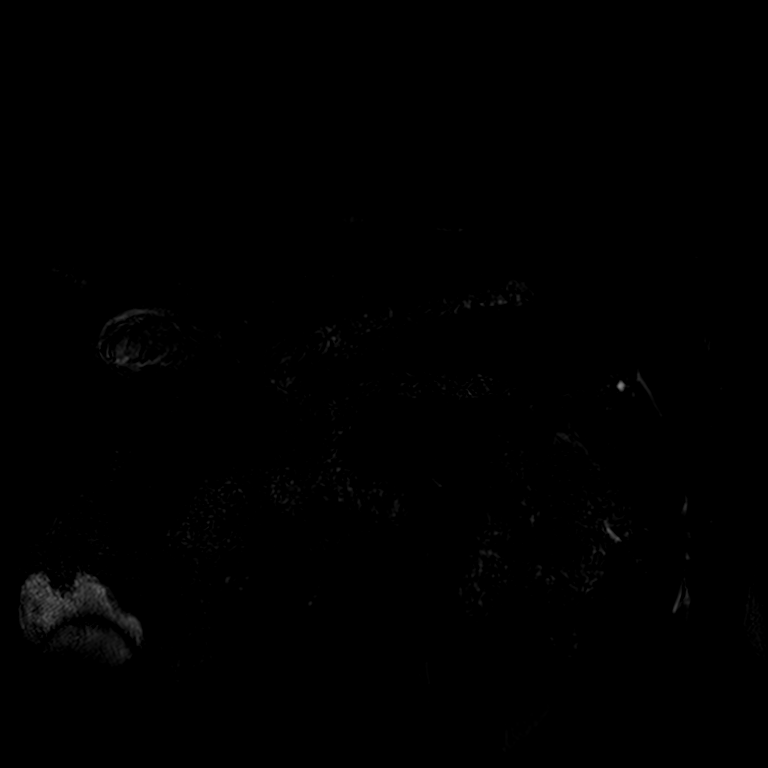

[Series 13: T2 · axial · 7.0mm · 1.41mm/px · 1 of 30 slices shown (4 of 6)]
[im 1/30]
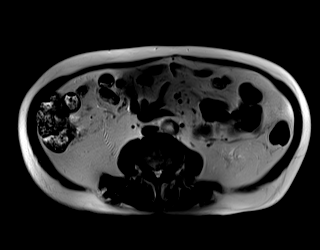

[Series 14: T2 · 1 of 5 slices shown (5 of 6)]
[im 1/5]
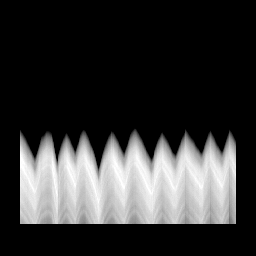

[Series 16: T1 · axial · 3.5mm · 1.41mm/px · z∈[-151,+97]mm · 4 of 144 slices shown]
[im 1/144]
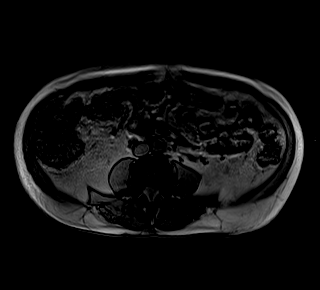
[im 48/144]
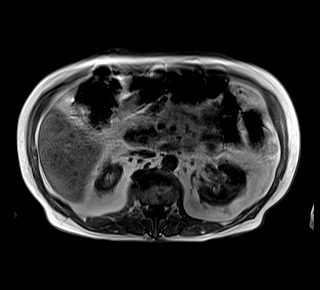
[im 96/144]
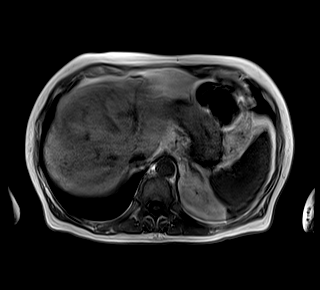
[im 144/144]
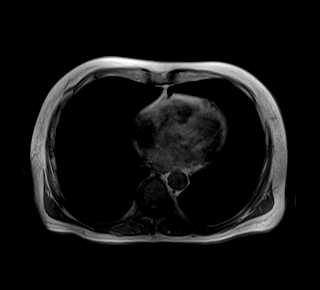

[Series 17: bSSFP · axial · 5.0mm · 1.41mm/px · 1 of 38 slices shown]
[im 1/38]
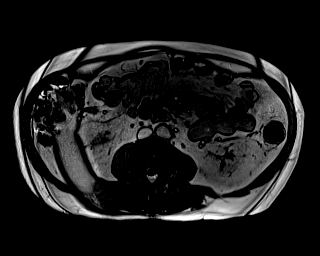

[Series 18: T2 · coronal · 3.0mm · 1.19mm/px · 1 of 19 slices shown (6 of 6)]
[im 1/19]
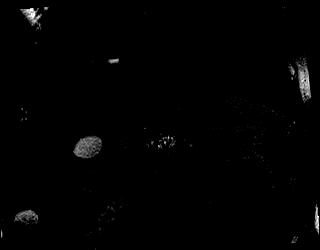

[Series 19: T1 dynamic · axial · non-contrast · 3.0mm · 1.41mm/px · z∈[-125,+88]mm · 3 of 72 slices shown]
[im 1/72]
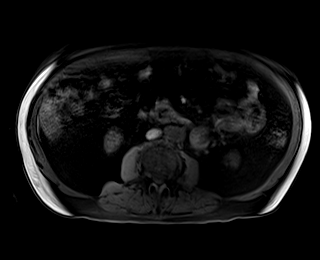
[im 36/72]
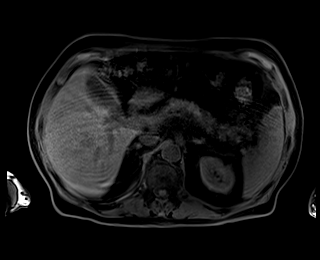
[im 72/72]
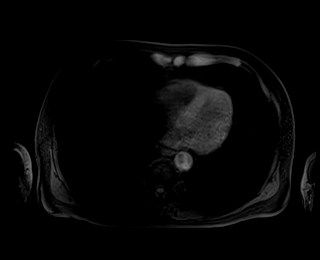

[Series 20: T1 dynamic post-contrast · axial · 3.0mm · 1.41mm/px · z∈[-125,+88]mm · 3 of 72 slices shown (1 of 7)]
[im 1/72]
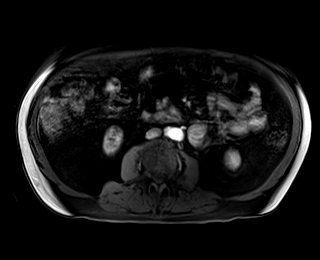
[im 36/72]
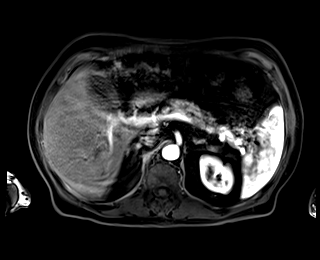
[im 72/72]
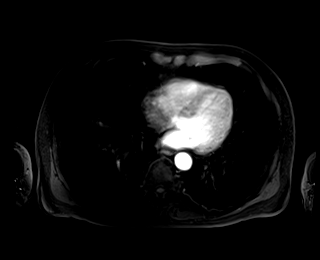

[Series 21: T1 dynamic post-contrast · axial · 3.0mm · 1.41mm/px · z∈[-125,+88]mm · 3 of 72 slices shown (2 of 7)]
[im 1/72]
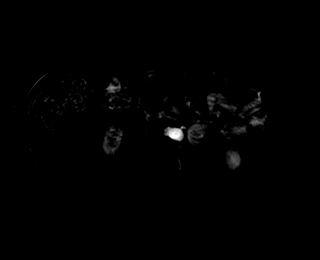
[im 36/72]
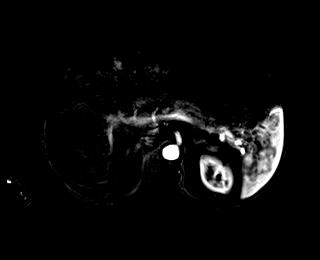
[im 72/72]
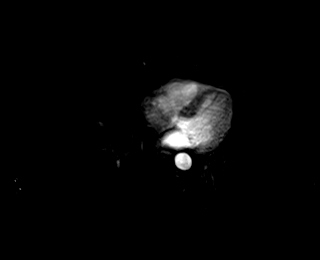

[Series 22: T1 dynamic post-contrast · axial · 3.0mm · 1.41mm/px · z∈[-125,+88]mm · 3 of 72 slices shown (3 of 7)]
[im 1/72]
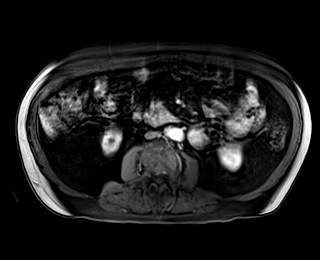
[im 36/72]
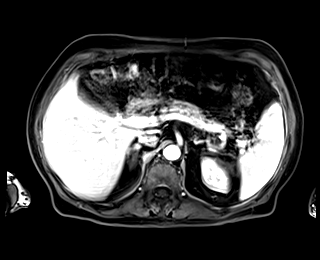
[im 72/72]
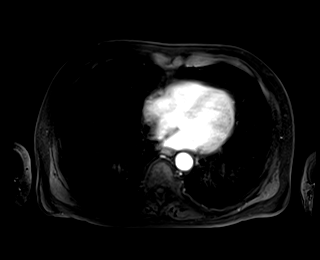

[Series 23: T1 dynamic post-contrast · axial · 3.0mm · 1.41mm/px · z∈[-125,+88]mm · 3 of 72 slices shown (4 of 7)]
[im 1/72]
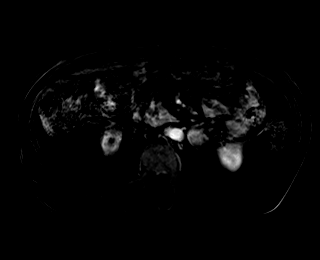
[im 36/72]
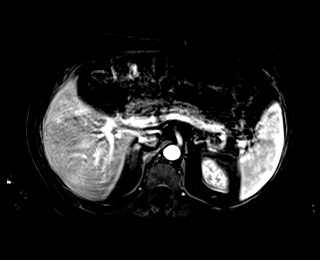
[im 72/72]
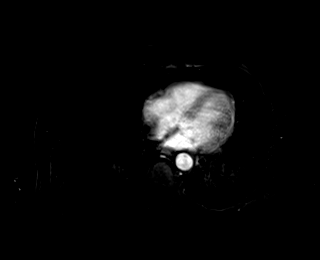

[Series 24: T1 dynamic post-contrast · axial · 3.0mm · 1.41mm/px · z∈[-125,+88]mm · 3 of 72 slices shown (5 of 7)]
[im 1/72]
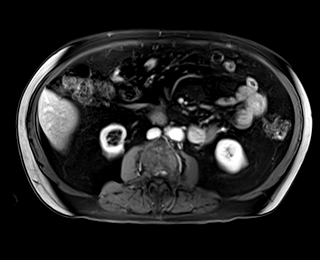
[im 36/72]
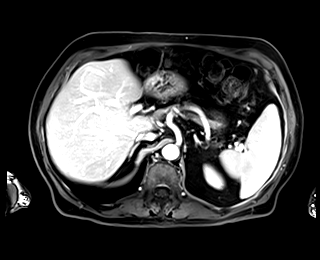
[im 72/72]
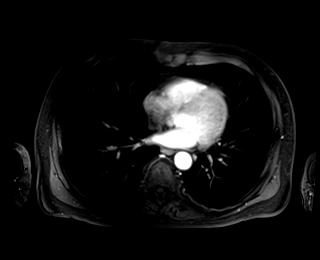

[Series 25: T1 dynamic post-contrast · axial · 3.0mm · 1.41mm/px · z∈[-125,+88]mm · 3 of 72 slices shown (6 of 7)]
[im 1/72]
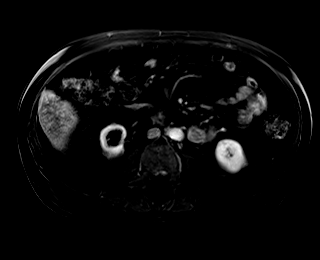
[im 36/72]
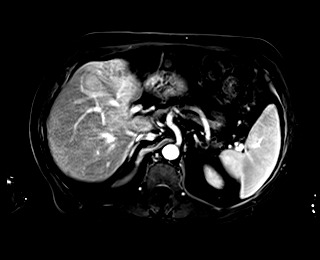
[im 72/72]
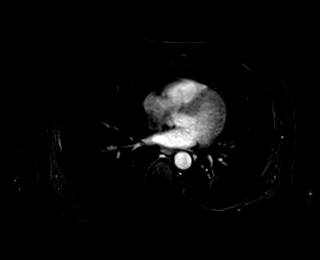

[Series 26: T1 dynamic post-contrast · coronal · 3.0mm · 1.41mm/px · 1 of 72 slices shown (7 of 7)]
[im 1/72]
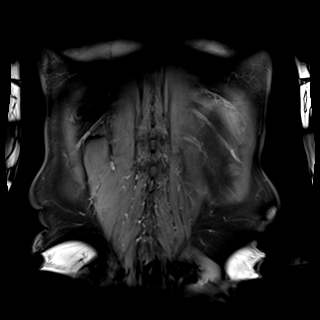

[39 of 48 positions shown; findings below may reference images not displayed]

FINDINGS: Lower chest: Unremarkable.

Hepatobiliary: Diffuse loss of signal intensity throughout the
hepatic parenchyma on out of phase dual echo images, indicative of a
background of hepatic steatosis. No suspicious cystic or solid
hepatic lesions. No intra or extrahepatic biliary ductal dilatation.
Tiny filling defects lying dependently in the gallbladder,
compatible with cholelithiasis. Gallbladder is not distended.
Gallbladder wall thickness is normal. No pericholecystic fluid or
surrounding inflammatory changes.

Pancreas: Multiple small T1 hypointense, T2 hyperintense,
nonenhancing lesions are again noted throughout the pancreatic
parenchyma, similar in size and number to the prior study. The
largest of these is in the inferior aspect of the uncinate process
of the pancreas (axial image 23 of series 13 and coronal image 19 of
series 9) measuring 2.3 x 1.3 x 1.6 cm. This largest lesion has
several thin internal septations. No definite solid pancreatic
lesions are identified. MRCP images are suboptimal, limited by
patient respiratory motion. With these limitations in mind,
communication with the main pancreatic duct is difficult to
establish, but is suspected with several of these lesions. Portions
of the main pancreatic duct are mildly dilated up to 5 mm.
Widespread side branch ectasia. No definite solid enhancing
pancreatic mass identified. No peripancreatic fluid collections or
inflammatory changes.

Spleen:  Unremarkable.

Adrenals/Urinary Tract: Moderate to severe atrophy of the right
kidney. Left kidney and bilateral adrenal glands are normal in
appearance. No hydroureteronephrosis in the visualized portions of
the abdomen.

Stomach/Bowel: Normal appearance of the stomach. No pathologic
dilatation of visualized portions of small bowel or colon.

Vascular/Lymphatic: Aortic atherosclerosis, without definite
aneurysm in the abdominal vasculature. No lymphadenopathy noted in
the abdomen.

Other: No significant volume of ascites noted in the visualized
portions of the peritoneal cavity.

Musculoskeletal: No aggressive appearing osseous lesions are noted
in the visualized portions of the skeleton.
IMPRESSION: 1. When comparing today's study with the previous examination which
was more limited by respiratory motion, overall, the appearance of
the pancreas is grossly stable, with numerous small cystic lesions
scattered throughout the pancreatic parenchyma, largest of which is
in the uncinate process. These may represent small pancreatic
pseudocysts and/or side branch IPMNs (intraductal papillary mucinous
neoplasm). At this time, no solid pancreatic neoplasm is identified.
Given the potential for communication with the main pancreatic duct
and the size of the largest lesion (currently 2.3 x 1.3 x 1.6 cm),
repeat abdominal MRI with and without IV gadolinium with MRCP is
recommended in 6 months. This recommendation follows ACR consensus
guidelines: Management of Incidental Pancreatic Cysts: A White Paper
of the ACR Incidental Findings Committee. [HOSPITAL]
[RX];[DATE].
2. Hepatic steatosis.
3. Cholelithiasis without evidence of acute cholecystitis.
4. Moderate to severe atrophy of the right kidney.
5. Aortic atherosclerosis.

## 2020-10-26 MED ORDER — GADOBENATE DIMEGLUMINE 529 MG/ML IV SOLN
20.0000 mL | Freq: Once | INTRAVENOUS | Status: AC | PRN
  Administered 2020-10-26: 20 mL via INTRAVENOUS

## 2020-10-28 ENCOUNTER — Other Ambulatory Visit: Payer: Self-pay | Admitting: Adult Health

## 2020-10-28 DIAGNOSIS — K862 Cyst of pancreas: Secondary | ICD-10-CM

## 2020-12-14 ENCOUNTER — Other Ambulatory Visit: Payer: Self-pay | Admitting: Internal Medicine

## 2020-12-23 ENCOUNTER — Encounter: Payer: Medicare Other | Admitting: Internal Medicine

## 2020-12-28 ENCOUNTER — Encounter: Payer: Medicare Other | Admitting: Internal Medicine

## 2021-01-15 ENCOUNTER — Encounter: Payer: Self-pay | Admitting: Internal Medicine

## 2021-01-15 ENCOUNTER — Ambulatory Visit: Payer: Medicare Other | Admitting: Internal Medicine

## 2021-01-15 ENCOUNTER — Other Ambulatory Visit: Payer: Self-pay

## 2021-01-15 VITALS — BP 134/84 | HR 80 | Temp 97.9°F | Resp 16 | Ht 71.0 in | Wt 197.2 lb

## 2021-01-15 DIAGNOSIS — E1169 Type 2 diabetes mellitus with other specified complication: Secondary | ICD-10-CM

## 2021-01-15 DIAGNOSIS — N138 Other obstructive and reflux uropathy: Secondary | ICD-10-CM

## 2021-01-15 DIAGNOSIS — Z0000 Encounter for general adult medical examination without abnormal findings: Secondary | ICD-10-CM

## 2021-01-15 DIAGNOSIS — N1832 Chronic kidney disease, stage 3b: Secondary | ICD-10-CM

## 2021-01-15 DIAGNOSIS — Z1211 Encounter for screening for malignant neoplasm of colon: Secondary | ICD-10-CM

## 2021-01-15 DIAGNOSIS — N401 Enlarged prostate with lower urinary tract symptoms: Secondary | ICD-10-CM

## 2021-01-15 DIAGNOSIS — Z136 Encounter for screening for cardiovascular disorders: Secondary | ICD-10-CM

## 2021-01-15 DIAGNOSIS — M1000 Idiopathic gout, unspecified site: Secondary | ICD-10-CM

## 2021-01-15 DIAGNOSIS — E1142 Type 2 diabetes mellitus with diabetic polyneuropathy: Secondary | ICD-10-CM

## 2021-01-15 DIAGNOSIS — Z87891 Personal history of nicotine dependence: Secondary | ICD-10-CM

## 2021-01-15 DIAGNOSIS — E559 Vitamin D deficiency, unspecified: Secondary | ICD-10-CM

## 2021-01-15 DIAGNOSIS — I10 Essential (primary) hypertension: Secondary | ICD-10-CM

## 2021-01-15 DIAGNOSIS — Z0001 Encounter for general adult medical examination with abnormal findings: Secondary | ICD-10-CM

## 2021-01-15 DIAGNOSIS — E1122 Type 2 diabetes mellitus with diabetic chronic kidney disease: Secondary | ICD-10-CM

## 2021-01-15 DIAGNOSIS — I700 Atherosclerosis of aorta: Secondary | ICD-10-CM

## 2021-01-15 DIAGNOSIS — Z125 Encounter for screening for malignant neoplasm of prostate: Secondary | ICD-10-CM

## 2021-01-15 DIAGNOSIS — Z79899 Other long term (current) drug therapy: Secondary | ICD-10-CM

## 2021-01-15 DIAGNOSIS — K219 Gastro-esophageal reflux disease without esophagitis: Secondary | ICD-10-CM

## 2021-01-15 DIAGNOSIS — F3341 Major depressive disorder, recurrent, in partial remission: Secondary | ICD-10-CM

## 2021-01-15 NOTE — Patient Instructions (Signed)

## 2021-01-15 NOTE — Progress Notes (Signed)
Annual  Screening/Preventative Visit  & Comprehensive Evaluation & Examination  Future Appointments  Date Time Provider [place]  01/15/2021 11:00 AM Unk Pinto, MD [facility] None  05/03/2021    Wellness   2:30 PM Liane Comber, NP [facility] None  01/17/2022 11:00 AM Unk Pinto, MD [facility] None            This very nice 80 y.o.  MWM presents for a Screening /Preventative Visit & comprehensive evaluation and management of multiple medical co-morbidities.  Patient has been followed for HTN, HLD, T2_NIDDM  and Vitamin D Deficiency.       HTN predates circa 2004. Patient's BP has been controlled at home.  Today's BP was initially elevated & rechecked at goal  - 134/84. Patient denies any cardiac symptoms as chest pain, palpitations, shortness of breath, dizziness or ankle swelling.       Patient's hyperlipidemia is controlled with diet and Rosuvastatin. Patient denies myalgias or other medication SE's. 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 Patient denies reactive hypoglycemic symptoms, visual blurring, diabetic polys or paresthesias. Last A1c was not at goal :   Lab Results  Component Value Date   HGBA1C 9.2 (H) 07/09/2020          Finally, patient has history of Vitamin D Deficiency ("24" /2008) and last vitamin D was not at goal (70-100) :   Lab Results  Component Value Date   VD25OH 50 07/09/2020     Current Outpatient Medications on File Prior to Visit  Medication Sig   albuterol HFA inhaler    aspirin 81 MG tablet Take daily.   VITAMIN D 5000 units  Take 2 capsules (10,000 Units total) daily.   Chromium-Cinnamon [phone] MCG-MG  Take 2 capsules  daily.   famotidine  40 MG tablet Take  as needed.    fenofibrate  145 MG tablet TAKE 1 TABLET DAILY    FLONASE nasal spray Place 2 sprays into both nostrils daily.   glipiZIDE 10 MG tablet TAKE 1 TABLET  3 TIMES    losartan 100 MG tablet TAKE 1 TABLET DAILY    metFORMIN -XR 500 MG  TAKE 2 TABLETS TWICE A DAY    Multiple Vitamins-Minerals  Take 1 tablet  daily.   Potassium Citrate 15 MEQ Take 1 tablet 2 x /day to Prevent Kidney Stones   PROAIR HFA inhaler    rosuvastatin  5 MG tablet TAKE 1 TABLET  EVERY DAY    Semaglutide, 1 MG/DOSE, (OZEMPIC, 1 MG/DOSE,) 4 MG/3ML SOPN Inject 1 mg into the skin once a week.      Allergies  Allergen Reactions   Doxycycline    Niacin And Related  flushing     Sulfa Antibiotics Itching     Past Medical History:  Diagnosis Date   Allergic rhinitis    Bladder stones    CKD (chronic kidney disease), stage III ([place])    Common iliac aneurysm (HCC) right side   followed by vascular-- dr Trula Slade (note in epic)   Environmental and seasonal allergies    External carotid artery stenosis     per last duplex 05-18-2011  right moderate  stenosis and left moderate to severe   Hematuria    History of adenomatous polyp of colon    tubular adenoma's   History of bladder stone    History of DVT in adulthood    08/ 2009 right lower extremity;  2013  left lower extremity  (10-11-2018 per pt no dvt since 2013)   History of kidney stones    History of pulmonary embolism    bilatera lung  08/ 2009  (10-11-2018  per pt no pe since 2009)   History of wheezing    allergies--- prn inhaler   Hypertension    Hypogonadism male    Internal carotid artery stenosis, bilateral     per last duplex 05-18-2011 in epic,   bilateral 40-59% stenosis  (10-11-2018  per pt denies stroke/tia S&S)   Lower urinary tract symptoms (LUTS)    Mixed hyperlipidemia    Nephrolithiasis    Testosterone deficiency    Type 2 diabetes mellitus ([place])    Vitamin D deficiency    Wears glasses      Health  Maintenance  Topic Date Due   Zoster Vaccines- Shingrix (1 of 2) Never done   Pneumonia Vaccine 32+ Years old (2 - PPSV23 if available, else PCV20) 01/14/2016   OPHTHALMOLOGY EXAM  06/26/2020   COLONOSCOPY  10/25/2020   FOOT EXAM  12/14/2020   HEMOGLOBIN A1C  01/08/2021   TETANUS/TDAP  12/30/2026   INFLUENZA VACCINE  Completed   COVID-19 Vaccine  Completed   HPV VACCINES  Aged Out     Immunization History  Administered Date(s) Administered   Fluad Quad(high Dose 65+) 11/23/2018   Influenza, High Dose Seasonal PF 01/14/2015, 11/26/2015, 12/29/2016, 12/18/2017   Moderna Sars-Covid-2 Vaccination 04/04/2019, 05/02/2019   Pneumococcal Conjugate-13 01/14/2015   Pneumococcal-Unspecified 04/02/2008   Td 04/02/2006, 12/29/2016   Zoster, Live 04/03/2007    Last Colon - 10/25/2017 - Dr Havery Moros - Recc 3 yr f/u  due Aug_2022                                                                 Past Surgical History:  Procedure Laterality Date   COLONOSCOPY  last one 10-25-2017   CYSTOSCOPY WITH LITHOLAPAXY N/A 07/18/2016   Procedure: CYSTOSCOPY WITH LITHOLAPAXY;  Surgeon: Kathie Rhodes, MD;  Location: [facility];  Service: Urology;  Laterality: N/A;   CYSTOSCOPY WITH LITHOLAPAXY N/A 10/15/2018   Procedure: CYSTOSCOPY WITH LITHOLAPAXY;  Surgeon: Kathie Rhodes, MD;  Location: [facility];  Service: Urology;  Laterality: N/A;   IVC FILTER INSERTION  11/21/2007   IVC FILTER REMOVAL  01/31/2008   ORCHIOPEXY Bilateral 1950's   undescended testis's   RIGHT CHEEK/MOUTH BIOPSY  09/16/2008  (office)   squamous hyperplasia w/ hyperkeratosis   RIGHT DEEP CERVICAL NODE BIOPSY  01/03/2001   right posterior neck ( lymphoid hyperplasia w/ florid follicular hyperplasia, no malignancy   TONSILLECTOMY  age 21     Family History  Problem Relation Age of Onset   Alzheimer's disease Mother    Diabetes Mother    Prostate cancer Father    Breast cancer Sister    Colon cancer  Neg Hx    Rectal cancer Neg Hx    Stomach cancer Neg Hx    Esophageal  cancer Neg Hx      Social History   Tobacco Use   Smoking status: Former    Packs/day: 1.00    Years: 20.00    Pack years: 20.00    Types: Cigarettes    Quit date: 04/16/1991    Years since quitting: 29.7   Smokeless tobacco: Never  Substance Use Topics   Alcohol use: Yes    Comment: ocassional    Drug use: Never      ROS Constitutional: Denies fever, chills, weight loss/gain, headaches, insomnia,  night sweats or change in appetite. Does c/o fatigue. Eyes: Denies redness, blurred vision, diplopia, discharge, itchy or watery eyes.  ENT: Denies discharge, congestion, post nasal drip, epistaxis, sore throat, earache, hearing loss, dental pain, Tinnitus, Vertigo, Sinus pain or snoring.  Cardio: Denies chest pain, palpitations, irregular heartbeat, syncope, dyspnea, diaphoresis, orthopnea, PND, claudication or edema Respiratory: denies cough, dyspnea, DOE, pleurisy, hoarseness, laryngitis or wheezing.  Gastrointestinal: Denies dysphagia, heartburn, reflux, water brash, pain, cramps, nausea, vomiting, bloating, diarrhea, constipation, hematemesis, melena, hematochezia, jaundice or hemorrhoids Genitourinary: Denies dysuria, frequency, discharge, hematuria or flank pain. Has urgency, nocturia x 1-3 & occasional hesitancy. Musculoskeletal: Denies arthralgia, myalgia, stiffness, Jt. Swelling, pain, limp or strain/sprain. Denies Falls. Skin: Denies puritis, rash, hives, warts, acne, eczema or change in skin lesion Neuro: No weakness, tremor, incoordination, spasms, paresthesia or pain Psychiatric: Denies confusion, memory loss or sensory loss. Denies Depression. Endocrine: Denies change in weight, skin, hair change, nocturia, and paresthesia, diabetic polys, visual blurring or hyper / hypo glycemic episodes.  Heme/Lymph: No excessive bleeding, bruising or enlarged lymph nodes.   Physical Exam  BP 134/84   Pulse 80    Temp 97.9 F (36.6 C)   Resp 16   Ht 5\' 11"  (1.803 m)   Wt 197 lb 3.2 oz (89.4 kg)   SpO2 96%   BMI 27.50 kg/m   General Appearance: Well nourished and well groomed and in no apparent distress.  Eyes: PERRLA, EOMs, conjunctiva no swelling or erythema, normal fundi and vessels. Sinuses: No frontal/maxillary tenderness ENT/Mouth: EACs patent / TMs  nl. Nares clear without erythema, swelling, mucoid exudates. Oral hygiene is good. No erythema, swelling, or exudate. Tongue normal, non-obstructing. Tonsils not swollen or erythematous. Hearing normal.  Neck: Supple, thyroid not palpable. No bruits, nodes or JVD. Respiratory: Respiratory effort normal.  BS equal and clear bilateral without rales, rhonci, wheezing or stridor. Cardio: Heart sounds are normal with regular rate and rhythm and no murmurs, rubs or gallops. Peripheral pulses are normal and equal bilaterally without edema. No aortic or femoral bruits. Chest: symmetric with normal excursions and percussion.  Abdomen: Soft, with Nl bowel sounds. Nontender, no guarding, rebound, hernias, masses, or organomegaly.  Lymphatics: Non tender without lymphadenopathy.  Musculoskeletal: Full ROM all peripheral extremities, joint stability, 5/5 strength, and normal gait. Skin: Warm and dry without rashes, lesions, cyanosis, clubbing or  ecchymosis.  Neuro: Cranial nerves intact, reflexes equal bilaterally. Normal muscle tone, no cerebellar symptoms. Sensation intact to touch, but decreased to vibratory and Monofilament in a stocking distribution to the toes bilaterally. Pysch: Alert and oriented x 3 with normal affect, insight and judgment appropriate.   Assessment and Plan  1. Annual Preventative/Screening Exam    2. Essential hypertension  - EKG 12-Lead - [place], RETROPERITNL ABD,  LTD - Urinalysis, Routine w reflex microscopic - Microalbumin / creatinine urine ratio - CBC with Differential/Platelet - COMPLETE METABOLIC PANEL WITH GFR -  Magnesium - TSH  3. Hyperlipidemia associated  with type 2 diabetes mellitus ([place])  - EKG 12-Lead - [place], RETROPERITNL ABD,  LTD - Lipid panel - TSH  4. Type 2 diabetes mellitus with stage 3b chronic kidney  disease, without long-term current use of insulin (HCC)  - EKG 12-Lead - [place], RETROPERITNL ABD,  LTD - Urinalysis, Routine w reflex microscopic - Microalbumin / creatinine urine ratio - HM DIABETES FOOT EXAM - LOW EXTREMITY NEUR EXAM DOCUM - PTH, intact and calcium - Hemoglobin A1c - Insulin, random  5. Vitamin D deficiency  - VITAMIN D 25 Hydroxy   6. Aortic atherosclerosis ([place]) by CT scan 2018  - EKG 12-Lead - [place], RETROPERITNL ABD,  LTD - Lipid panel  7. Diabetic polyneuropathy associated with type 2 diabetes mellitus (HCC)  - Hemoglobin A1c  8. Idiopathic gout  - Uric acid  9. Gastroesophageal reflux disease  - CBC with Differential/Platelet  10. BPH with obstruction/lower urinary tract symptoms  - PSA  11. Recurrent major depressive disorder, in partial remission ([place])   12. Screening for colorectal cancer  - POC Hemoccult Bld/Stl   13. Prostate cancer screening  - PSA  14. Screening for ischemic heart disease  - EKG 12-Lead  15. Former smoker  - EKG 12-Lead - [place], RETROPERITNL ABD,  LTD  16. Screening for AAA (aortic abdominal aneurysm)  - [place], RETROPERITNL ABD,  LTD  17. Medication management  - Urinalysis, Routine w reflex microscopic - Microalbumin / creatinine urine ratio - Uric acid - CBC with Differential/Platelet - COMPLETE METABOLIC PANEL WITH GFR - Magnesium - Lipid panel - TSH - Hemoglobin A1c - Insulin, random - VITAMIN D 25 Hydroxy           Patient was counseled in prudent diet, weight control to achieve/maintain BMI less than 25, BP monitoring, regular exercise and medications as discussed.  Discussed med effects and SE's. Routine screening labs and tests as requested with regular follow-up as recommended. Over  40 minutes of exam, counseling, chart review and high complex critical decision making was performed   Kirtland Bouchard, MD

## 2021-01-16 NOTE — Progress Notes (Signed)
============================================================ ============================================================  -  PSA - Low - Great  !  ============================================================ ============================================================  -  Uric Acid / Gout test is OK  - Please continue Allopurinol  ============================================================ ============================================================  -  Glucose = 276 mg%    and A1c = 8.4% - Both way too high   - Being diabetic  and especially poorly controlled  has a 300% increased risk for   heart attack,                 stroke,                       Cancer &                              alzheimer- type                                           vascular dementia.   [id number]  It is very important that you work harder with diet by  avoiding all foods that are white except chicken,  fish & calliflower.  - Avoid white rice  (brown & wild rice is OK),   - Avoid white potatoes  (sweet potatoes in moderation is OK),   White bread or wheat bread or anything made out of   white flour like bagels, donuts, rolls, buns, biscuits, cakes,  - pastries, cookies, pizza crust, and pasta (made from  white flour & egg whites)   - vegetarian pasta or spinach or wheat pasta is OK.  - Multigrain breads like Arnold's, Pepperidge Farm or   multigrain sandwich thins or high fiber breads like   [place] bread or "Dave's Killer" breads that are  4 to 5 grams fiber per slice !  are best.    [id number]  Diet, exercise and weight loss can reverse and cure  diabetes in the early stages.    - Diet, exercise and weight loss is very important in the   control and prevention of complications of diabetes which  affects every system in your body, ie.   -Brain - dementia/stroke,  - eyes -  glaucoma/blindness,  - heart - heart attack/heart failure,  - kidneys - dialysis,  - stomach - gastric paralysis,  - intestines - malabsorption,  - nerves - severe painful neuritis,  - circulation - gangrene & loss of a leg(s)  - and finally  . . . . . . . . . . . . . . . . . .    - cancer and Alzheimers. ============================================================ ============================================================  -  Kidney functions are still in Stage 3a   and Stable ============================================================ ============================================================  -   -  Total  Chol =    119  -  Great  !            (  Ideal  or  Goal is less than 180  !  )   - and   -  Bad / Dangerous LDL  Chol =  64   - also Excellent              (  Ideal  or  Goal is less than 70  !  )   - But Triglycerides (   267   ) or fats in blood are too high  (  goal is less than 150)    - Recommend avoid fried & greasy foods,  sweets / candy,   - Avoid white rice  (brown or wild rice or Quinoa is OK),   - Avoid white potatoes  (sweet potatoes are OK)   - Avoid anything made from white flour  - bagels, doughnuts, rolls, buns, biscuits, white and   wheat breads, pizza crust and traditional  pasta made of white flour & egg white  - (vegetarian pasta or spinach or wheat pasta is OK).    - Multi-grain bread is OK - like multi-grain flat bread or  sandwich thins.   - Avoid alcohol in excess.   - Exercise is also important. ============================================================ ============================================================  -  Vitamin D = 42 - Very, very low  Are you taking the   recommended 2 capsules of 5,000 units = 10,000 units every day  ? ============================================================ ============================================================  -  All Else - CBC  - Electrolytes - Liver - Magnesium & Thyroid     - all  Normal / OK ============================================================ ============================================================

## 2021-01-18 LAB — URINALYSIS, ROUTINE W REFLEX MICROSCOPIC
Bilirubin Urine: NEGATIVE
Hgb urine dipstick: NEGATIVE
Ketones, ur: NEGATIVE
Leukocytes,Ua: NEGATIVE
Nitrite: NEGATIVE
Protein, ur: NEGATIVE
Specific Gravity, Urine: 1.028 (ref 1.001–1.035)
pH: <=5 (ref 5.0–8.0)

## 2021-01-18 LAB — COMPLETE METABOLIC PANEL WITH GFR
AG Ratio: 1.8 (calc) (ref 1.0–2.5)
ALT: 11 U/L (ref 9–46)
AST: 9 U/L — ABNORMAL LOW (ref 10–35)
Albumin: 4.4 g/dL (ref 3.6–5.1)
Alkaline phosphatase (APISO): 72 U/L (ref 35–144)
BUN/Creatinine Ratio: 18 (calc) (ref 6–22)
BUN: 24 mg/dL (ref 7–25)
CO2: 22 mmol/L (ref 20–32)
Calcium: 10 mg/dL (ref 8.6–10.3)
Chloride: 106 mmol/L (ref 98–110)
Creat: 1.37 mg/dL — ABNORMAL HIGH (ref 0.70–1.22)
Globulin: 2.5 g/dL (calc) (ref 1.9–3.7)
Glucose, Bld: 276 mg/dL — ABNORMAL HIGH (ref 65–99)
Potassium: 4.5 mmol/L (ref 3.5–5.3)
Sodium: 138 mmol/L (ref 135–146)
Total Bilirubin: 0.6 mg/dL (ref 0.2–1.2)
Total Protein: 6.9 g/dL (ref 6.1–8.1)
eGFR: 52 mL/min/{1.73_m2} — ABNORMAL LOW (ref >=60)

## 2021-01-18 LAB — INSULIN, RANDOM: Insulin: 20.5 u[IU]/mL — ABNORMAL HIGH

## 2021-01-18 LAB — LIPID PANEL
Cholesterol: 119 mg/dL (ref <200)
HDL: 24 mg/dL — ABNORMAL LOW (ref >=40)
LDL Cholesterol (Calc): 64 mg/dL (calc)
Non-HDL Cholesterol (Calc): 95 mg/dL (calc) (ref <130)
Total CHOL/HDL Ratio: 5 (calc) — ABNORMAL HIGH (ref <5.0)
Triglycerides: 267 mg/dL — ABNORMAL HIGH (ref <150)

## 2021-01-18 LAB — PTH, INTACT AND CALCIUM
Calcium: 10 mg/dL (ref 8.6–10.3)
PTH: 23 pg/mL (ref 16–77)

## 2021-01-18 LAB — CBC WITH DIFFERENTIAL/PLATELET
Absolute Monocytes: 304 cells/uL (ref 200–950)
Basophils Absolute: 53 cells/uL (ref 0–200)
Basophils Relative: 0.8 %
Eosinophils Absolute: 290 cells/uL (ref 15–500)
Eosinophils Relative: 4.4 %
HCT: 40.3 % (ref 38.5–50.0)
Hemoglobin: 13.3 g/dL (ref 13.2–17.1)
Lymphs Abs: 1650 cells/uL (ref 850–3900)
MCH: 30.3 pg (ref 27.0–33.0)
MCHC: 33 g/dL (ref 32.0–36.0)
MCV: 91.8 fL (ref 80.0–100.0)
MPV: 9.8 fL (ref 7.5–12.5)
Monocytes Relative: 4.6 %
Neutro Abs: 4303 cells/uL (ref 1500–7800)
Neutrophils Relative %: 65.2 %
Platelets: 150 10*3/uL (ref 140–400)
RBC: 4.39 10*6/uL (ref 4.20–5.80)
RDW: 13.1 % (ref 11.0–15.0)
Total Lymphocyte: 25 %
WBC: 6.6 10*3/uL (ref 3.8–10.8)

## 2021-01-18 LAB — MICROALBUMIN / CREATININE URINE RATIO
Creatinine, Urine: 112 mg/dL (ref 20–320)
Microalb Creat Ratio: 5 mcg/mg creat (ref <30)
Microalb, Ur: 0.6 mg/dL

## 2021-01-18 LAB — HEMOGLOBIN A1C
Hgb A1c MFr Bld: 8.4 % of total Hgb — ABNORMAL HIGH (ref <5.7)
Mean Plasma Glucose: 194 mg/dL
eAG (mmol/L): 10.8 mmol/L

## 2021-01-18 LAB — URIC ACID: Uric Acid, Serum: 4.5 mg/dL (ref 4.0–8.0)

## 2021-01-18 LAB — PSA: PSA: 0.26 ng/mL (ref <=4.00)

## 2021-01-18 LAB — VITAMIN D 25 HYDROXY (VIT D DEFICIENCY, FRACTURES): Vit D, 25-Hydroxy: 42 ng/mL (ref 30–100)

## 2021-01-18 LAB — MAGNESIUM: Magnesium: 2 mg/dL (ref 1.5–2.5)

## 2021-01-18 LAB — TSH: TSH: 2.07 mIU/L (ref 0.40–4.50)

## 2021-01-30 ENCOUNTER — Other Ambulatory Visit: Payer: Self-pay | Admitting: Adult Health

## 2021-01-30 ENCOUNTER — Other Ambulatory Visit: Payer: Self-pay | Admitting: Internal Medicine

## 2021-01-30 MED ORDER — OZEMPIC (2 MG/DOSE) 8 MG/3ML ~~LOC~~ SOPN: PEN_INJECTOR | SUBCUTANEOUS | 3 refills | Status: DC

## 2021-02-15 ENCOUNTER — Other Ambulatory Visit: Payer: Self-pay | Admitting: Adult Health

## 2021-02-15 DIAGNOSIS — N182 Chronic kidney disease, stage 2 (mild): Secondary | ICD-10-CM

## 2021-05-02 ENCOUNTER — Other Ambulatory Visit: Payer: Self-pay | Admitting: Adult Health

## 2021-05-03 ENCOUNTER — Ambulatory Visit: Payer: Medicare Other | Admitting: Adult Health

## 2021-05-06 ENCOUNTER — Ambulatory Visit: Payer: Medicare Other | Admitting: Adult Health

## 2021-05-06 ENCOUNTER — Other Ambulatory Visit: Payer: Self-pay

## 2021-05-06 ENCOUNTER — Encounter: Payer: Self-pay | Admitting: Adult Health

## 2021-05-06 VITALS — BP 112/68 | HR 87 | Temp 97.2°F | Wt 197.4 lb

## 2021-05-06 DIAGNOSIS — Z0000 Encounter for general adult medical examination without abnormal findings: Secondary | ICD-10-CM

## 2021-05-06 DIAGNOSIS — E1142 Type 2 diabetes mellitus with diabetic polyneuropathy: Secondary | ICD-10-CM | POA: Diagnosis not present

## 2021-05-06 DIAGNOSIS — Z6827 Body mass index (BMI) 27.0-27.9, adult: Secondary | ICD-10-CM

## 2021-05-06 DIAGNOSIS — Z86010 Personal history of colonic polyps: Secondary | ICD-10-CM

## 2021-05-06 DIAGNOSIS — I723 Aneurysm of iliac artery: Secondary | ICD-10-CM

## 2021-05-06 DIAGNOSIS — Z79899 Other long term (current) drug therapy: Secondary | ICD-10-CM

## 2021-05-06 DIAGNOSIS — N1832 Chronic kidney disease, stage 3b: Secondary | ICD-10-CM

## 2021-05-06 DIAGNOSIS — E1169 Type 2 diabetes mellitus with other specified complication: Secondary | ICD-10-CM | POA: Diagnosis not present

## 2021-05-06 DIAGNOSIS — K219 Gastro-esophageal reflux disease without esophagitis: Secondary | ICD-10-CM

## 2021-05-06 DIAGNOSIS — F3341 Major depressive disorder, recurrent, in partial remission: Secondary | ICD-10-CM

## 2021-05-06 DIAGNOSIS — K760 Fatty (change of) liver, not elsewhere classified: Secondary | ICD-10-CM

## 2021-05-06 DIAGNOSIS — R0989 Other specified symptoms and signs involving the circulatory and respiratory systems: Secondary | ICD-10-CM

## 2021-05-06 DIAGNOSIS — N183 Chronic kidney disease, stage 3 unspecified: Secondary | ICD-10-CM

## 2021-05-06 DIAGNOSIS — E1122 Type 2 diabetes mellitus with diabetic chronic kidney disease: Secondary | ICD-10-CM | POA: Diagnosis not present

## 2021-05-06 DIAGNOSIS — Z0001 Encounter for general adult medical examination with abnormal findings: Secondary | ICD-10-CM

## 2021-05-06 DIAGNOSIS — R6889 Other general symptoms and signs: Secondary | ICD-10-CM

## 2021-05-06 DIAGNOSIS — E785 Hyperlipidemia, unspecified: Secondary | ICD-10-CM

## 2021-05-06 DIAGNOSIS — I700 Atherosclerosis of aorta: Secondary | ICD-10-CM

## 2021-05-06 DIAGNOSIS — N261 Atrophy of kidney (terminal): Secondary | ICD-10-CM

## 2021-05-06 DIAGNOSIS — K862 Cyst of pancreas: Secondary | ICD-10-CM

## 2021-05-06 DIAGNOSIS — D696 Thrombocytopenia, unspecified: Secondary | ICD-10-CM

## 2021-05-06 MED ORDER — FLUTICASONE PROPIONATE 50 MCG/ACT NA SUSP: 2.0000 | Freq: Every day | NASAL | 2 refills | Status: DC

## 2021-05-06 MED ORDER — LOSARTAN POTASSIUM 25 MG PO TABS: ORAL_TABLET | ORAL | 3 refills | Status: AC

## 2021-05-06 NOTE — Progress Notes (Signed)
MEDICARE ANNUAL WELLNESS VISIT AND FOLLOW UP Assessment:   Annual Medicare Wellness Visit Due annually  Health maintenance reviewed  Check with [facility]/insurance about shingrix coverage  Atherosclerosis of aorta (HCC) Control blood pressure, cholesterol, glucose, increase exercise.   Essential hypertension - BP low for age, no microalbuminuria, try reducing losartan to 25 mg daily -DASH diet, exercise and monitor at home. Call if greater than 130/80.  -     CBC with Differential/Platelet -     COMPLETE METABOLIC PANEL WITH GFR -     TSH  Aneurysm of left iliac artery (HCC) Control blood pressure, cholesterol, glucose, increase exercise.   CKD stage 3 due to type 2 diabetes mellitus (HCC) Increase fluids, avoid NSAIDS, monitor sugars, will monitor  Type 2 diabetes mellitus with stage 3b chronic kidney disease, without long-term current use of insulin (HCC) Has not been taking maximized metformin, discussed to taper up slowly to 4 tabs as tolerated Continue ozempic Discussed hypoglycemia risks with glipizide in elder, monitor closely, if fasting glucose trending down <150 start slow taper, ideally will d/c Education: Reviewed ABCs of diabetes management (respective goals in parentheses):  A1C (<7), blood pressure (<130/80), and cholesterol (LDL <70) Eye Exam yearly and Dental Exam every 6 months. - overdue eye exam, SCHEDULE ASAP  Dietary recommendations Physical Activity recommendations - A1C  Diabetic polyneuropathy associated with type 2 diabetes mellitus (HCC) Control sugars Continue follow up with poditrist  Hyperlipidemia associated with type 2 diabetes mellitus (HCC) Continue medications LDL goal <70 Continue low cholesterol diet and exercise.   Gastroesophageal reflux disease, unspecified whether esophagitis present Continue PPI/H2 blocker, diet discussed  Overweight - BMI 27 - follow up 3 months for progress monitoring - increase veggies, decrease carbs - long  discussion about weight loss, diet, and exercise  Thrombocytopenia (HCC) Will monitor, CBC  Vitamin D deficiency Continue supplement  GERD Well managed on current medications Discussed diet, avoiding triggers and other lifestyle changes  Hepatic steatosis Weight loss advised, avoid alcohol/tylenol, will monitor LFTs  Gallstones Monitor; denies current sx  Pancreas cyst 6 month follow up MRI is ordered, DUE, will assist to schedule  History of colon polyps Numerous on last, 3 year recall by Dr. Havery Moros is overdue, referral placed  R kidney atrophy Incidentally seen on MRI; discussed and ordered doppler to r/o stenosis  Orders Placed This Encounter  Procedures   CBC with Differential/Platelet   COMPLETE METABOLIC PANEL WITH GFR   Magnesium   Lipid panel   TSH   Hemoglobin A1c   Ambulatory referral to Gastroenterology   VAS US RENAL ARTERY DUPLEX    Over 30 minutes of exam, counseling, chart review, and critical decision making was performed  Future Appointments  Date Time Provider [place]  08/11/2021 10:30 AM Unk Pinto, MD [facility] None  01/17/2022 11:00 AM Unk Pinto, MD [facility] None  05/06/2022 11:00 AM Liane Comber, NP [facility] None     Plan:   During the course of the visit the patient was educated and counseled about appropriate screening and preventive services including:   Pneumococcal vaccine  Influenza vaccine Prevnar 13 Td vaccine Screening electrocardiogram Colorectal cancer screening Diabetes screening Glaucoma screening Nutrition counseling    Subjective:  Joshua Peterson is a 81 y.o. male who presents for Medicare Annual Wellness Visit and 1 month follow up for poorly controlled T2DM.   Follows with Dr. Karsten Ro (going to retire per patient) for kidney stones.   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   BMI is Body mass index is 27.53 kg/m., he is working on diet, admits hasn't been walking much, typically does well when he does. Weight down since on ozempic.  Wt Readings from Last 3 Encounters:  05/06/21 197 lb 6.4 oz (89.5 kg)  01/15/21 197 lb 3.2 oz (89.4 kg)  07/09/20 210 lb (95.3 kg)   His blood pressure has been controlled at home, today their BP is BP: 112/68 He does not workout. He denies chest pain, shortness of breath, dizziness.   He has history of recurrent DVTs with travel, had IVC filter He has aortic atherosclerosis per Ct 2018.   He is on cholesterol medication and denies myalgias. His cholesterol is not at goal. The cholesterol last visit was:   Lab Results  Component Value Date   CHOL 119 01/15/2021   HDL 24 (L) 01/15/2021   LDLCALC 64 01/15/2021   TRIG 267 (H) 01/15/2021   CHOLHDL 5.0 (H) 01/15/2021   He admits hasn't been working on diet and exercise for Diabetes  with diabetic chronic kidney disease he is on losartan 50 mg Hyperlipidemia on crestor 5 mg at goal, on tricor.  he is on metformin prescribed 4 tabs/day He was prescribed ozempic, on 2 mg weekly  Cinnamon  Glucotrol 10 mg 3 x a day right after eating He does check fasting, reports ranges 120-224, average 180s Denies hypoglycemia DEE 06/2019 no retinopathy, overdue and denies paresthesia of the feet, polydipsia, polyuria and visual disturbances.  Last A1C was:  Lab Results  Component Value Date   HGBA1C 8.4 (H) 01/15/2021   CKD III Last GFR Lab Results  Component Value Date   EGFR 52 (L) 01/15/2021    Patient is on Vitamin D supplement.   Lab Results  Component Value Date   VD25OH 42 01/15/2021   Medication Review:  Current Outpatient Medications (Endocrine & Metabolic):     glipiZIDE (GLUCOTROL) 10 MG tablet, TAKE 1 TABLET BY MOUTH 3 TIMES A DAY WITH MEALS FOR DIABETES   metFORMIN (GLUCOPHAGE-XR) 500 MG 24 hr tablet, TAKE 2 TABLETS TWICE A DAY WITH MEALS FOR DIABETES   Semaglutide, 2 MG/DOSE, (OZEMPIC, 2 MG/DOSE,) 8 MG/3ML SOPN, Inject  2 mg  into skin  weekly  for Diabetes  Current Outpatient Medications (Cardiovascular):    fenofibrate (TRICOR) 145 MG tablet, TAKE 1 TABLET DAILY FOR TRIGLYCERIDES (BLOOD FATS)   losartan (COZAAR) 100 MG tablet, TAKE 1 TABLET DAILY FOR BLOOD PRESSURE   rosuvastatin (CRESTOR) 5 MG tablet, TAKE 1 TABLET BY MOUTH EVERY DAY FOR CHOLESTEROL  Current Outpatient Medications (Respiratory):    albuterol (VENTOLIN HFA) 108 (90 Base) MCG/ACT inhaler,    fluticasone (FLONASE) 50 MCG/ACT nasal spray, Place 2 sprays into both nostrils daily.   PROAIR HFA 108 (90 Base) MCG/ACT inhaler,   Current Outpatient Medications (Analgesics):    aspirin 81 MG tablet, Take 81 mg by mouth daily.   Current Outpatient Medications (Other):    Cholecalciferol (VITAMIN D3) 5000 units CAPS, Take 2 capsules (10,000 Units total) by mouth daily. (Patient taking differently: Take 2 capsules by mouth daily.)   Chromium-Cinnamon (CINNAMON PLUS CHROMIUM) [phone] MCG-MG CAPS, Take 2 capsules by mouth daily.   famotidine (PEPCID) 40  MG tablet, Take 40 mg by mouth as needed.   Multiple Vitamins-Minerals (MEGA MULTIVITAMIN FOR MEN) TABS, Take 1 tablet by mouth daily.   ONETOUCH ULTRA test strip, TEST BLOOD SUGAR ONCE DAILY FOR DIABETES   Potassium Citrate 15 MEQ (1620 MG) TBCR, Take 1 tablet 2 x /day to Prevent Kidney Stones  Allergies: Allergies  Allergen Reactions   Doxycycline     unknown   Niacin And Related Other (See Comments)    flushing   Sulfa Antibiotics Itching    Current Problems (verified) has History of DVT of lower extremity; CKD stage 3 due to type 2 diabetes mellitus ([place]); Labile hypertension; Hyperlipidemia associated with type 2  diabetes mellitus ([place]); Vitamin D deficiency; Obesity (BMI 30.0-34.9); Medication management; Gastroesophageal reflux disease; Thrombocytopenia ([place]); Aneurysm of left iliac artery (HCC); Gallstones; Depression; Type 2 diabetes mellitus with stage 3 chronic kidney disease, without long-term current use of insulin ([place]); BPH with obstruction/lower urinary tract symptoms; Diabetic polyneuropathy associated with type 2 diabetes mellitus ([place]); Aortic atherosclerosis ([place]) by CT scan 2018; Pancreas cyst; and Hepatic steatosis on their problem list.  Screening Tests Immunization History  Administered Date(s) Administered   Fluad Quad(high Dose 65+) 11/23/2018, 01/01/2021   Influenza, High Dose Seasonal PF 01/14/2015, 11/26/2015, 12/29/2016, 12/18/2017   Influenza-Unspecified 12/29/2018, 01/20/2020   Moderna Covid-19 Vaccine Bivalent Booster 66yr & up 01/01/2021   Moderna Sars-Covid-2 Vaccination 04/04/2019, 05/02/2019, 01/31/2020, 07/28/2020   PNEUMOCOCCAL CONJUGATE-20 03/31/2021   Pneumococcal Conjugate-13 01/14/2015   Pneumococcal Polysaccharide-23 12/29/2018   Pneumococcal-Unspecified 04/02/2008   Td 04/02/2006, 12/29/2016   Tdap 03/31/2021   Zoster, Live 04/03/2007   Preventative care: Last colonoscopy: 10/2017 Dr. AHavery Moros polyps due for 3 year, overdue   TDAP 2018 CXR 2017  Names of Other Physician/Practitioners you currently use: 1. [facility] here for primary care 2. Dr. GDelman Cheadle eye doctor, last visit 06/2019 3. Dr. NMariea Clonts dentist, last visit 2021  Patient Care Team: MUnk Pinto MD as PCP - General (Internal Medicine) OKathie Rhodes MD (Inactive) as Consulting Physician (Urology)  Surgical: He  has a past surgical history that includes RIGHT DEEP CERVICAL NODE BIOPSY (01/03/2001); RIGHT CHEEK/MOUTH BIOPSY (09/16/2008  (office)); Colonoscopy (last one 10-25-2017); IVC FILTER INSERTION (11/21/2007); IVC FILTER REMOVAL (01/31/2008);  Tonsillectomy (age 81; Orchiopexy (Bilateral, 1950's); Cystoscopy with litholapaxy (N/A, 07/18/2016); and Cystoscopy with litholapaxy (N/A, 10/15/2018). Family His family history includes Alzheimer's disease in his mother; Breast cancer in his sister; Diabetes in his mother; Prostate cancer in his father. Social history  He reports that he quit smoking about 30 years ago. His smoking use included cigarettes. He has a 20.00 pack-year smoking history. He has never used smokeless tobacco. He reports current alcohol use. He reports that he does not use drugs.  MEDICARE WELLNESS OBJECTIVES: Physical activity:   Cardiac risk factors:   Depression/mood screen:   Depression screen [facility]2/9 01/15/2021  Decreased Interest 0  Down, Depressed, Hopeless 0  PHQ - 2 Score 0  Altered sleeping -  Tired, decreased energy -  Change in appetite -  Feeling bad or failure about yourself  -  Trouble concentrating -  Moving slowly or fidgety/restless -  Suicidal thoughts -  PHQ-9 Score -  Difficult doing work/chores -    ADLs:  In your present state of health, do you have any difficulty performing the following activities: 01/15/2021  Hearing? N  Vision? N  Difficulty concentrating or making decisions? N  Walking or climbing stairs? N  Dressing or bathing?  N  Doing errands, shopping? N  Some recent data might be hidden     Cognitive Testing  Alert? Yes  Normal Appearance?Yes  Oriented to person? Yes  Place? Yes   Time? Yes  Recall of three objects?  Yes  Can perform simple calculations? Yes  Displays appropriate judgment?Yes  Can read the correct time from a watch face?Yes  EOL planning:     Objective:   Today's Vitals   05/06/21 1107  BP: 112/68  Pulse: 87  Temp: (!) 97.2 F (36.2 C)  SpO2: 99%  Weight: 197 lb 6.4 oz (89.5 kg)   Body mass index is 27.53 kg/m.  Wt Readings from Last 3 Encounters:  05/06/21 197 lb 6.4 oz (89.5 kg)  01/15/21 197 lb 3.2 oz (89.4 kg)  07/09/20 210 lb  (95.3 kg)     General appearance: alert, no distress, WD/WN, male obese HEENT: normocephalic, sclerae anicteric, TMs pearly, nares patent, no discharge or erythema, pharynx normal.  Left eye with external hordeoleum.  No active drainage or discharge.  No conjunctival injections Oral cavity: MMM, no lesions Neck: supple, no lymphadenopathy, no thyromegaly, no masses Heart: RRR, normal S1, S2, no murmurs Lungs: CTA bilaterally, no wheezes, rhonchi, or rales Abdomen: +bs, soft, obese, non tender, non distended, no masses, no hepatomegaly, no splenomegaly Musculoskeletal: nontender, no swelling, no obvious deformity Extremities: no edema, no cyanosis, no clubbing Pulses: 2+ symmetric, upper and lower extremities, normal cap refill Neurological: alert, oriented x 3, CN2-12 intact, strength normal upper extremities and lower extremities, sensation decreased bilateral feet, DTRs 2+ throughout, no cerebellar signs, gait normal Psychiatric: normal affect, behavior normal, pleasant   Medicare Attestation I have personally reviewed: The patient's medical and social history Their use of alcohol, tobacco or illicit drugs Their current medications and supplements The patient's functional ability including ADLs,fall risks, home safety risks, cognitive, and hearing and visual impairment Diet and physical activities Evidence for depression or mood disorders  The patient's weight, height, BMI, and visual acuity have been recorded in the chart.  I have made referrals, counseling, and provided education to the patient based on review of the above and I have provided the patient with a written personalized care plan for preventive services.     Izora Ribas, NP   05/06/2021

## 2021-05-06 NOTE — Patient Instructions (Addendum)
Goals      Blood Pressure < 140/80     Exercise 150 min/wk Moderate Activity     Fasting Blood Glucose <130     Weight (lb) < 180 lb (81.6 kg)         Please schedule overdue diabetes eye exam   Please switch losartan dose  Gradually increase metformin, taper down glipizide if any low sugars

## 2021-05-07 LAB — HEMOGLOBIN A1C
Hgb A1c MFr Bld: 8.7 % of total Hgb — ABNORMAL HIGH (ref <5.7)
Mean Plasma Glucose: 203 mg/dL
eAG (mmol/L): 11.2 mmol/L

## 2021-05-07 LAB — COMPLETE METABOLIC PANEL WITH GFR
AG Ratio: 1.6 (calc) (ref 1.0–2.5)
ALT: 11 U/L (ref 9–46)
AST: 9 U/L — ABNORMAL LOW (ref 10–35)
Albumin: 4.3 g/dL (ref 3.6–5.1)
Alkaline phosphatase (APISO): 78 U/L (ref 35–144)
BUN/Creatinine Ratio: 15 (calc) (ref 6–22)
BUN: 22 mg/dL (ref 7–25)
CO2: 21 mmol/L (ref 20–32)
Calcium: 9.7 mg/dL (ref 8.6–10.3)
Chloride: 108 mmol/L (ref 98–110)
Creat: 1.42 mg/dL — ABNORMAL HIGH (ref 0.70–1.22)
Globulin: 2.7 g/dL (calc) (ref 1.9–3.7)
Glucose, Bld: 279 mg/dL — ABNORMAL HIGH (ref 65–99)
Potassium: 4.8 mmol/L (ref 3.5–5.3)
Sodium: 138 mmol/L (ref 135–146)
Total Bilirubin: 0.6 mg/dL (ref 0.2–1.2)
Total Protein: 7 g/dL (ref 6.1–8.1)
eGFR: 50 mL/min/{1.73_m2} — ABNORMAL LOW (ref >=60)

## 2021-05-07 LAB — CBC WITH DIFFERENTIAL/PLATELET
Absolute Monocytes: 254 cells/uL (ref 200–950)
Basophils Absolute: 50 cells/uL (ref 0–200)
Basophils Relative: 0.8 %
Eosinophils Absolute: 174 cells/uL (ref 15–500)
Eosinophils Relative: 2.8 %
HCT: 42 % (ref 38.5–50.0)
Hemoglobin: 14 g/dL (ref 13.2–17.1)
Lymphs Abs: 1575 cells/uL (ref 850–3900)
MCH: 30.3 pg (ref 27.0–33.0)
MCHC: 33.3 g/dL (ref 32.0–36.0)
MCV: 90.9 fL (ref 80.0–100.0)
MPV: 9.6 fL (ref 7.5–12.5)
Monocytes Relative: 4.1 %
Neutro Abs: 4148 cells/uL (ref 1500–7800)
Neutrophils Relative %: 66.9 %
Platelets: 142 10*3/uL (ref 140–400)
RBC: 4.62 10*6/uL (ref 4.20–5.80)
RDW: 13.8 % (ref 11.0–15.0)
Total Lymphocyte: 25.4 %
WBC: 6.2 10*3/uL (ref 3.8–10.8)

## 2021-05-07 LAB — LIPID PANEL
Cholesterol: 148 mg/dL (ref <200)
HDL: 29 mg/dL — ABNORMAL LOW (ref >=40)
LDL Cholesterol (Calc): 83 mg/dL (calc)
Non-HDL Cholesterol (Calc): 119 mg/dL (calc) (ref <130)
Total CHOL/HDL Ratio: 5.1 (calc) — ABNORMAL HIGH (ref <5.0)
Triglycerides: 300 mg/dL — ABNORMAL HIGH (ref <150)

## 2021-05-07 LAB — MAGNESIUM: Magnesium: 2 mg/dL (ref 1.5–2.5)

## 2021-05-07 LAB — TSH: TSH: 1.76 mIU/L (ref 0.40–4.50)

## 2021-05-24 ENCOUNTER — Ambulatory Visit
Admission: RE | Admit: 2021-05-24 | Discharge: 2021-05-24 | Disposition: A | Discharge status: Home / Self Care | Payer: Medicare Other | Source: Ambulatory Visit | Attending: Adult Health | Admitting: Adult Health

## 2021-05-24 DIAGNOSIS — K862 Cyst of pancreas: Secondary | ICD-10-CM

## 2021-05-24 IMAGING — MR MR ABDOMEN WO/W CM MRCP
14 of 22 series · 24 of 48 positions shown · IV contrast (multihance)
Comparison: MRI abdomen [DATE]

CLINICAL DATA: Pancreatic cystic lesion

EXAM:
MRI ABDOMEN WITHOUT AND WITH CONTRAST (INCLUDING MRCP)
TECHNIQUE: Multiplanar multisequence MR imaging of the abdomen was performed
both before and after the administration of intravenous contrast.
Heavily T2-weighted images of the biliary and pancreatic ducts were
obtained, and three-dimensional MRCP images were rendered by post
processing.
CONTRAST:  18mL MULTIHANCE GADOBENATE DIMEGLUMINE 529 MG/ML IV SOLN

[Series 3: cor haste · coronal · 5.0mm · 0.80mm/px · 1 of 41 slices shown]
[im 1/41]
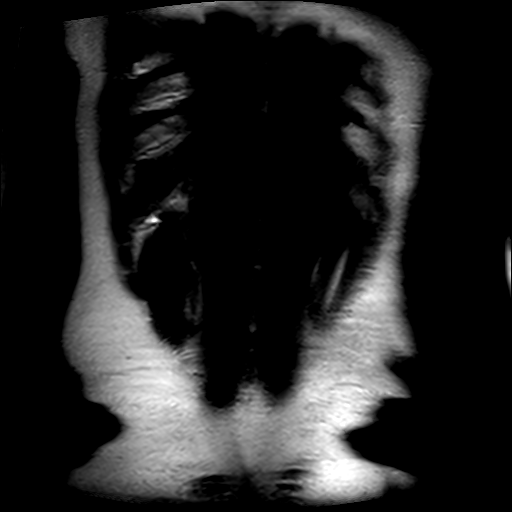

[Series 4: axial haste · axial · 6.0mm · 0.82mm/px · 1 of 43 slices shown]
[im 1/43]
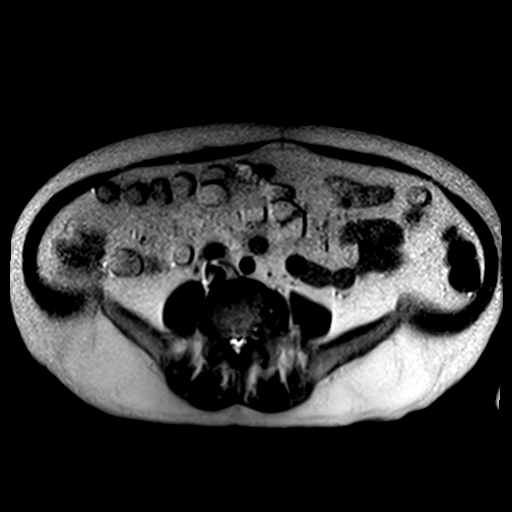

[Series 5: bSSFP · axial · 4.5mm · 0.82mm/px · 1 of 64 slices shown (1 of 2)]
[im 1/64]
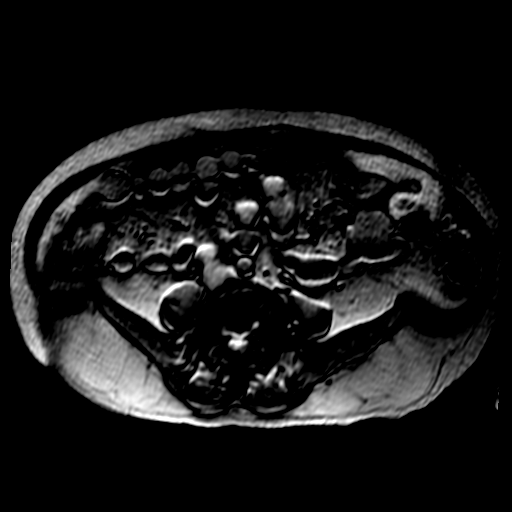

[Series 6: bSSFP · coronal · 5.5mm · 0.82mm/px · 1 of 36 slices shown (2 of 2)]
[im 1/36]
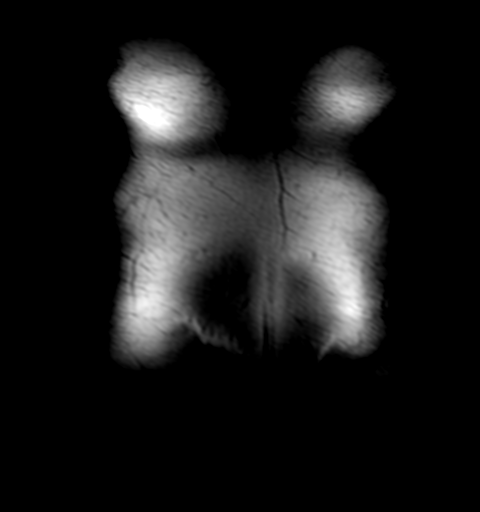

[Series 7: T2 fat-sat · axial · 6.0mm · 1.31mm/px · 1 of 43 slices shown (1 of 2)]
[im 1/43]
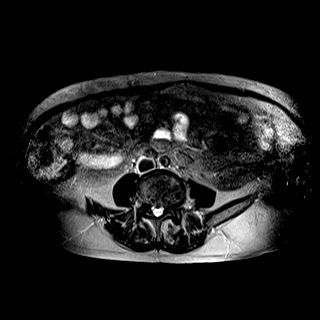

[Series 8: [id number]_trig · axial · 6.0mm · 2.19mm/px · z∈[-163,+139]mm · 4 of 127 slices shown]
[im 1/127]
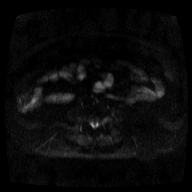
[im 43/127]
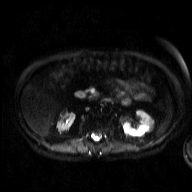
[im 85/127]
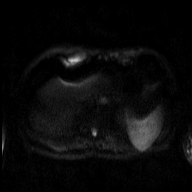
[im 127/127]
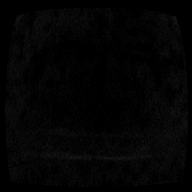

[Series 9: [id number]_trig_adc · axial · 6.0mm · 2.19mm/px · 1 of 43 slices shown]
[im 1/43]
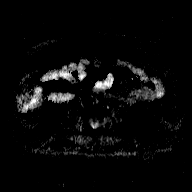

[Series 10: T2 fat-sat · axial · 6.0mm · 1.31mm/px · 1 of 43 slices shown (2 of 2)]
[im 1/43]
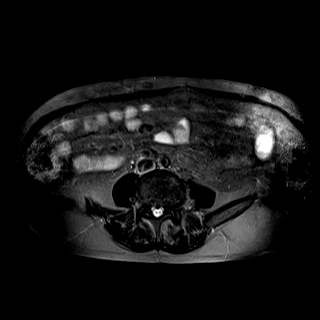

[Series 13: T2 · coronal · 3.5mm · 0.82mm/px · 2 of 62 slices shown]
[im 1/62]
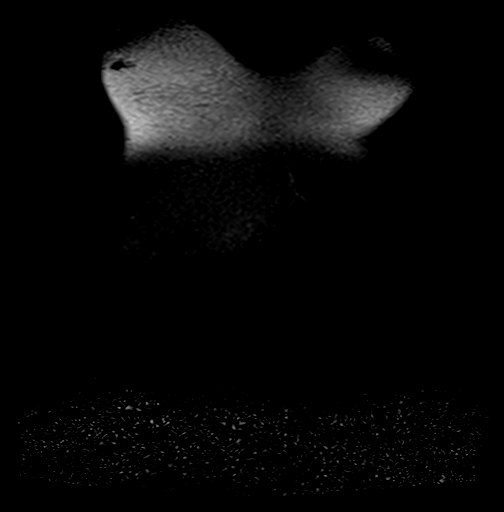
[im 62/62]
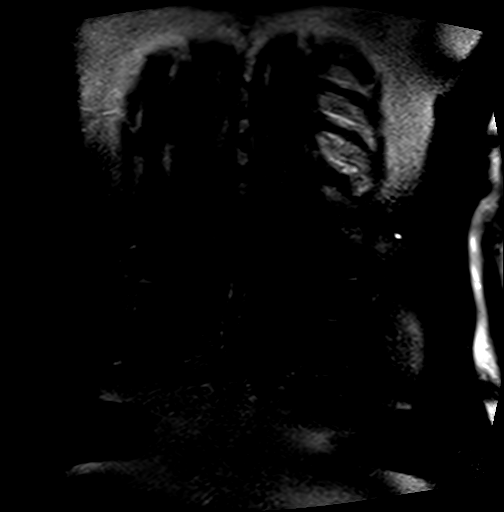

[Series 17: T1 · axial · 6.0mm · 0.82mm/px · z∈[-183,+94]mm · 3 of 86 slices shown]
[im 1/86]
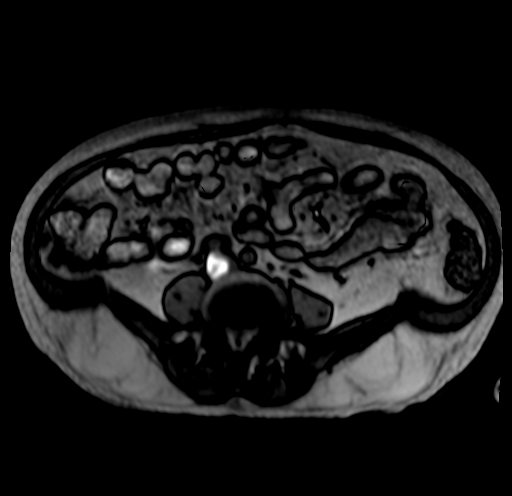
[im 43/86]
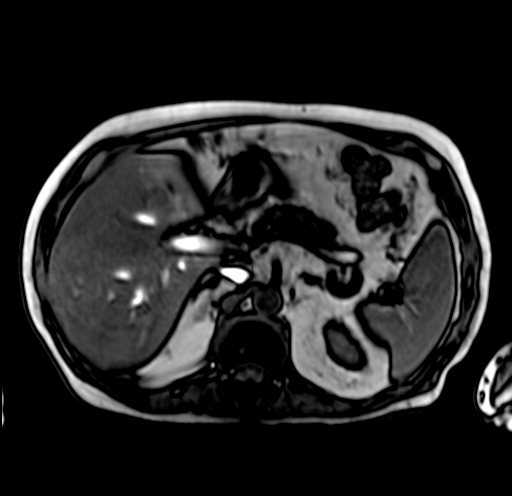
[im 86/86]
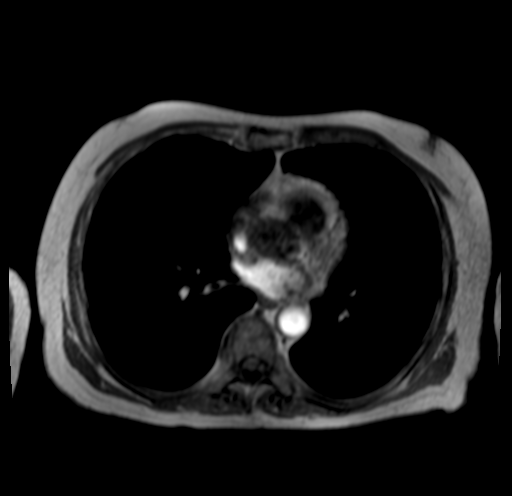

[Series 18: MRCP · oblique · 1.09mm/px · 1 of 18 slices shown]
[im 1/18]
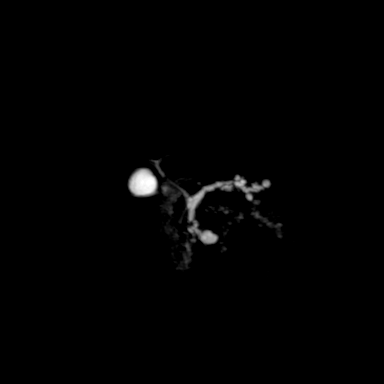

[Series 19: T1 dynamic · axial · non-contrast · 3.0mm · 0.82mm/px · z∈[-184,+101]mm · 3 of 96 slices shown]
[im 1/96]
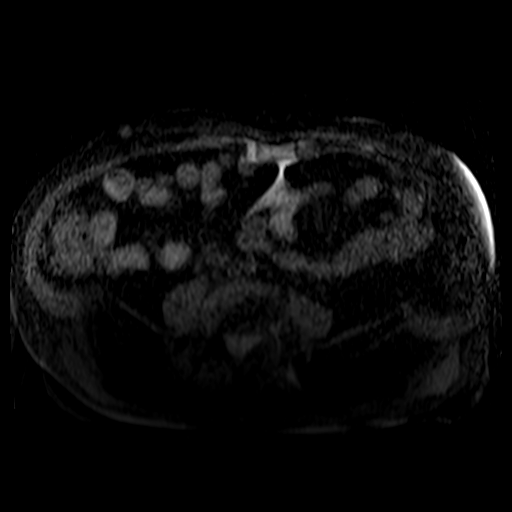
[im 48/96]
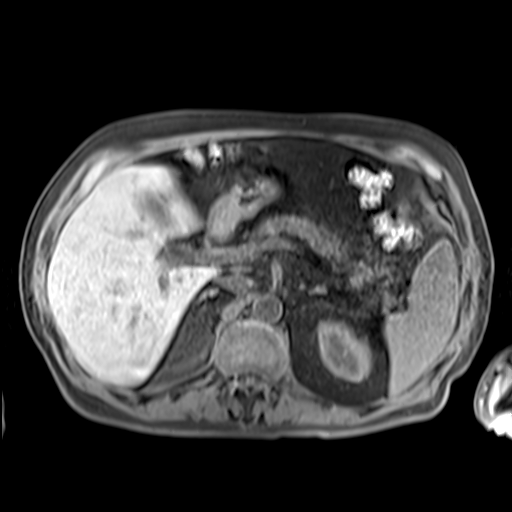
[im 96/96]
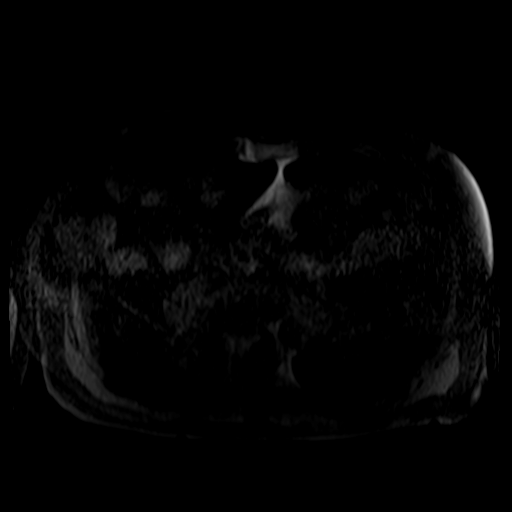

[Series 20: T1 dynamic post-contrast · axial · 3.0mm · 0.82mm/px · z∈[-184,+101]mm · 3 of 96 slices shown (1 of 2)]
[im 1/96]
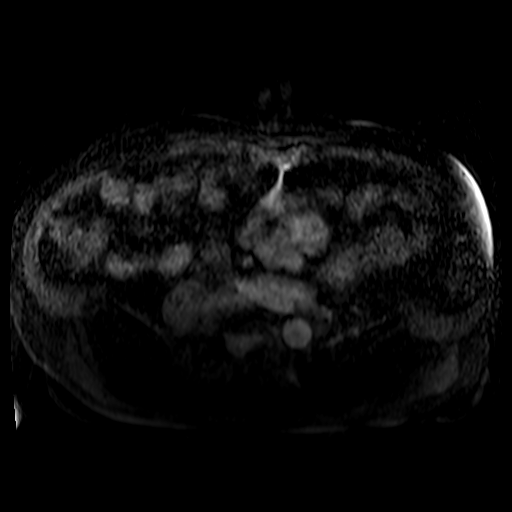
[im 48/96]
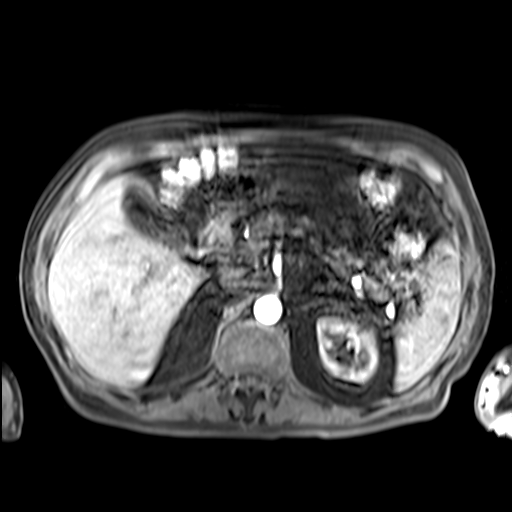
[im 96/96]
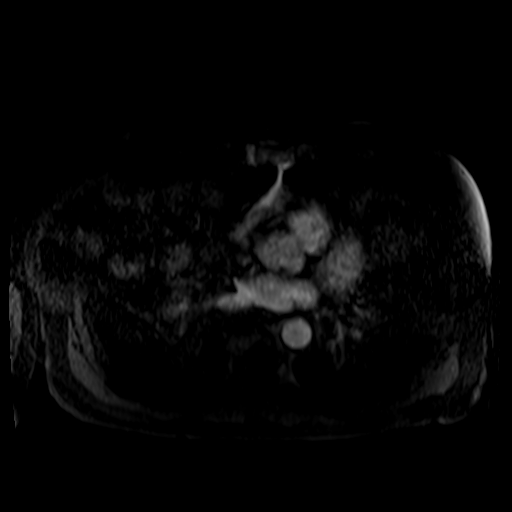

[Series 21: T1 dynamic post-contrast · axial · 3.0mm · 0.82mm/px · 1 of 96 slices shown (2 of 2)]
[im 1/96]
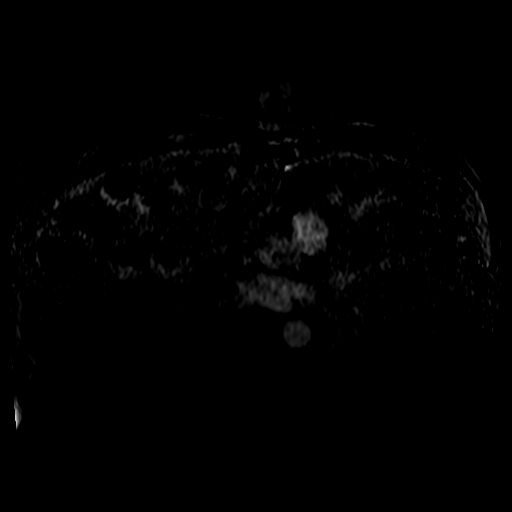

[24 of 48 positions shown; findings below may reference images not displayed]

FINDINGS: Study is limited due to motion.

Lower chest: No acute findings.

Hepatobiliary: Liver is normal in size and contour with no
suspicious mass identified. There is evidence of hepatic steatosis.
Small gallstones visualized. No biliary ductal dilatation.

Pancreas: Severely atrophic with tortuous dilated main pancreatic
duct measuring up to 8 mm in diameter, increased since previous
study. There are again numerous cystic foci seen throughout the
pancreas, most of which demonstrate communication with the
pancreatic duct. The largest cystic lesions measure up to 25 x 16 mm
in the uncinate process, 12 mm in the head of the pancreas, 8 mm in
the body, and 10 mm in the tail, which are mildly increased in size
since previous study. No definite suspicious enhancing nodularity
associated with the cystic lesions.

Spleen:  Within normal limits in size and appearance.

Adrenals/Urinary Tract: Adrenal glands appear normal. Right renal
atrophy. A few small renal cysts bilaterally. No hydronephrosis.

Stomach/Bowel: Visualized portions within the abdomen are
unremarkable.

Vascular/Lymphatic: No pathologically enlarged lymph nodes
identified. No abdominal aortic aneurysm demonstrated.

Other:  No ascites.

Musculoskeletal: No suspicious bony lesions.
IMPRESSION: 1. Evidence of chronic pancreatitis with severe pancreatic atrophy
and a tortuous dilated main pancreatic duct which is mildly
increased in diameter since previous study.
2. Numerous cystic foci again seen throughout the pancreas as
described which are mildly enlarged since previous study. Most
consistent with dilated ductal side branches and IPMNs. No
suspicious enhancement visualized. Recommend follow-up MRI in 12
months.
3. Cholelithiasis.
4. Hepatic steatosis.
5. Right renal atrophy.

## 2021-05-24 MED ORDER — GADOBENATE DIMEGLUMINE 529 MG/ML IV SOLN
18.0000 mL | Freq: Once | INTRAVENOUS | Status: AC | PRN
  Administered 2021-05-24: 18 mL via INTRAVENOUS

## 2021-05-27 ENCOUNTER — Encounter (HOSPITAL_COMMUNITY): Payer: Medicare Other

## 2021-06-11 ENCOUNTER — Ambulatory Visit (HOSPITAL_COMMUNITY)
Admission: RE | Admit: 2021-06-11 | Discharge: 2021-06-11 | Disposition: A | Discharge status: Home / Self Care | Payer: Medicare Other | Source: Ambulatory Visit | Attending: Cardiovascular Disease | Admitting: Cardiovascular Disease

## 2021-06-11 ENCOUNTER — Other Ambulatory Visit: Payer: Self-pay

## 2021-06-11 DIAGNOSIS — N261 Atrophy of kidney (terminal): Secondary | ICD-10-CM | POA: Insufficient documentation

## 2021-08-10 ENCOUNTER — Encounter: Payer: Self-pay | Admitting: Internal Medicine

## 2021-08-10 NOTE — Progress Notes (Unsigned)
Future Appointments  Date Time Provider Department  08/11/2021 10:30 AM Unk Pinto, MD [facility]  01/17/2022       CPE  11:00 AM Unk Pinto, MD [facility]  05/06/2022        Wellness 11:00 AM  [facility]    History of Present Illness:                                                         This very nice 81 y.o. MWM presents for 6  month follow up with HTN, HLD, Pre-Diabetes and Vitamin D Deficiency. Abd CT scan in 2018 showed Aortic Atherosclerosis. Patient has remote hx/o Gout & is off meds.                                                          Patient is treated for HTN & BP has been controlled at home. Today's BP is  at goal - 110/78.   Patient has had no complaints of any cardiac type chest pain, palpitations, dyspnea / orthopnea / PND, dizziness, claudication, or dependent edema.                                                         Hyperlipidemia is controlled with diet & meds. Patient denies myalgias or other med SE's. 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  Last A1c was not at goal:        Lab Results  Component Value Date    HGBA1C 8.4 (H) 04/02/2020                                                                                                  Further, the patient also has history of Vitamin D Deficiency and supplements vitamin D without any suspected side-effects. Last vitamin D was not at goal:   Lab Results  Component Value Date   VD25OH 42 01/15/2021   Outpatient Medications Prior to Visit  Medication Sig   aspirin 81 MG tablet Take  daily.   VITAMIN D  5,000 u Take 2 capsules daily  CINNAMON PLUS CHROMIUM [phone]  Take 2 capsules daily.   Famotidine 0 MG tablet Take  as needed.   fenofibrate  145 MG tablet TAKE 1 TABLET DAILY    FLONASE nasal spray Place 2 sprays into  nostrils daily.   glipiZIDE 10 MG tablet TAKE 1 TABLET 3 TIMES A DAY    losartan 25 MG tablet TAKE 1 TABLET DAILY    metFORMIN-XR 500 MG  TAKE 2 TABLETS TWICE A DAY WITH MEALS \\   Multiple Vitamins-Minerals Take 1 tablet daily.   Potassium Citrate 15 MEQ  Take 1 tablet 2 x /day to Prevent Kidney Stones   rosuvastatin 5 MG tablet TAKE 1 TABLET  EVERY DAY    Semaglutide, 2 MG/DOSE , 8 MG/3ML SOPN Inject  2 mg  into skin  weekly  for Diabetes          Allergies  Allergen Reactions   Doxycycline unknown   Niacin And Related flushing   Sulfa Antibiotics Itching      PMHx:       Past Medical History:  Diagnosis Date   Allergic rhinitis     Bladder stones     CKD (chronic kidney disease), stage III (HCC)     Common iliac aneurysm (HCC) right side    followed by vascular-- dr Trula Slade (note in epic)   Environmental and seasonal allergies     External carotid artery stenosis       per last duplex 05-18-2011  right moderate stenosis and left moderate to severe   Hematuria     History of adenomatous polyp of colon      tubular adenoma's   History of bladder stone     History of DVT in adulthood      08/ 2009 right lower extremity;  2013  left lower extremity  (10-11-2018 per pt no dvt since 2013)   History of kidney stones     History of pulmonary embolism      bilatera lung  08/ 2009  (10-11-2018  per pt no pe since 2009)   History of wheezing      allergies--- prn inhaler   Hypertension     Hypogonadism male     Internal carotid artery stenosis, bilateral       per last duplex 05-18-2011 in epic,   bilateral 40-59% stenosis  (10-11-2018  per pt denies stroke/tia S&S)   Lower urinary tract symptoms (LUTS)     Mixed hyperlipidemia     Nephrolithiasis      Testosterone deficiency     Type 2 diabetes mellitus (HCC)     Vitamin D deficiency     Wears glasses            Immunization History  Administered Date(s) Administered   Fluad Quad(high Dose 65) 11/23/2018   Influenza, High Dose  01/14/2015, 11/26/2015, 12/29/2016, 12/18/2017   Moderna Sars-Covid-2 Vacc 04/04/2019, 05/02/2019   Pneumococcal -13 01/14/2015   Pneumococcal-23 04/02/2008   Td 04/02/2006, 12/29/2016   Zoster 04/03/2007           Past Surgical History:  Procedure Laterality Date   COLONOSCOPY   last one 10-25-2017   CYSTOSCOPY WITH LITHOLAPAXY N/A 07/18/2016    Procedure: CYSTOSCOPY WITH LITHOLAPAXY;  Surgeon: Kathie Rhodes, MD;  Location: [place] [facility];  Service: Urology;  Laterality: N/A;   CYSTOSCOPY WITH LITHOLAPAXY N/A 10/15/2018    Procedure: CYSTOSCOPY WITH LITHOLAPAXY;  Surgeon: Kathie Rhodes, MD;  Location: [place];  Service:  Urology;  Laterality: N/A;   IVC FILTER INSERTION   11/21/2007   IVC FILTER REMOVAL   01/31/2008   ORCHIOPEXY Bilateral 1950's    undescended testis's   RIGHT CHEEK/MOUTH BIOPSY   09/16/2008  (office)    squamous hyperplasia w/ hyperkeratosis   RIGHT DEEP CERVICAL NODE BIOPSY   01/03/2001    right posterior neck (per path report-- lymphoid hyperplasia w/ florid follicular hyperplasia, no malignancy(   TONSILLECTOMY   age 29      FHx:    Reviewed / unchanged   SHx:    Reviewed / unchanged    Systems Review:   Constitutional: Denies fever, chills, wt changes, headaches, insomnia, fatigue, night sweats, change in appetite. Eyes: Denies redness, blurred vision, diplopia, discharge, itchy, watery eyes.  ENT: Denies discharge, congestion, post nasal drip, epistaxis, sore throat, earache, hearing loss, dental pain, tinnitus, vertigo, sinus pain, snoring.  CV: Denies chest pain, palpitations, irregular heartbeat, syncope, dyspnea, diaphoresis, orthopnea, PND, claudication or edema. Respiratory: denies cough,  dyspnea, DOE, pleurisy, hoarseness, laryngitis, wheezing.  Gastrointestinal: Denies dysphagia, odynophagia, heartburn, reflux, water brash, abdominal pain or cramps, nausea, vomiting, bloating, diarrhea, constipation, hematemesis, melena, hematochezia  or hemorrhoids. Genitourinary: Denies dysuria, frequency, urgency, nocturia, hesitancy, discharge, hematuria or flank pain. Musculoskeletal: Denies arthralgias, myalgias, stiffness, jt. swelling, pain, limping or strain/sprain.  Skin: Denies pruritus, rash, hives, warts, acne, eczema or change in skin lesion(s). Neuro: No weakness, tremor, incoordination, spasms, paresthesia or pain. Psychiatric: Denies confusion, memory loss or sensory loss. Endo: Denies change in weight, skin or hair change.  Heme/Lymph: No excessive bleeding, bruising or enlarged lymph nodes.   Physical Exam  BP 110/78   Pulse 71   Temp 97.9 F (36.6 C)   Resp 16   Ht '5\' 11"'$  (1.803 m)   Wt 195 lb 3.2 oz (88.5 kg)   SpO2 99%   BMI 27.22 kg/m    Appears  well nourished, well groomed  and in no distress.   Eyes: PERRLA, EOMs, conjunctiva no swelling or erythema. Sinuses: No frontal/maxillary tenderness ENT/Mouth: EAC's clear, TM's nl w/o erythema, bulging. Nares clear w/o erythema, swelling, exudates. Oropharynx clear without erythema or exudates. Oral hygiene is good. Tongue normal, non obstructing. Hearing intact.  Neck: Supple. Thyroid not palpable. Car 2+/2+ without bruits, nodes or JVD. Chest: Respirations nl with BS clear & equal w/o rales, rhonchi, wheezing or stridor.  Cor: Heart sounds normal w/ regular rate and rhythm without sig. murmurs, gallops, clicks or rubs. Peripheral pulses normal and equal  without edema.  Abdomen: Soft & bowel sounds normal. Non-tender w/o guarding, rebound, hernias, masses or organomegaly.  Lymphatics: Unremarkable.  Musculoskeletal: Full ROM all peripheral extremities, joint stability, 5/5 strength and normal gait.  Skin: Warm,  dry without exposed rashes, lesions or ecchymosis apparent.  Neuro: Cranial nerves intact, reflexes equal bilaterally. Sensory-motor testing grossly intact. Tendon reflexes grossly intact.  Pysch: Alert & oriented x 3.  Insight and judgement nl & appropriate. No ideations.   Assessment and Plan:    1. Essential hypertension  - Continue medication, monitor blood pressure at home.  - Continue DASH diet.  Reminder to go to the ER if any CP,  SOB, nausea, dizziness, severe HA, changes vision/speech.  - CBC with Differential/Platelet - COMPLETE METABOLIC PANEL WITH GFR  - Continue medication, monitor blood pressure at home.  - Continue DASH diet.  Reminder to go to the ER if any CP,  SOB, nausea, dizziness, severe HA, changes vision/speech.Magnesium - TSH  2.  Hyperlipidemia associated with type 2 diabetes mellitus ([place])  - Continue diet/meds, exercise,& lifestyle modifications.  - Continue monitor periodic cholesterol/liver & renal functions   - Lipid panel - TSH  3. 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  - Monitor appropriate labs  - Hemoglobin A1c - Insulin, random  4. Vitamin D deficiency  - Continue supplementation.  - VITAMIN D 25 Hydroxy  5. Idiopathic gout  - Uric acid  6. Aortic atherosclerosis ([place]) by CT scan 2018  - Lipid panel  7. Medication management  - CBC with Differential/Platelet - COMPLETE METABOLIC PANEL WITH GFR - Magnesium - Lipid panel - TSH - Hemoglobin A1c - Insulin, random - VITAMIN D 25 Hydroxy - Uric acid                                                                      Discussed  regular exercise, BP monitoring, weight control to achieve/maintain BMI less than 25 and discussed med and SE's. Recommended labs to assess and monitor clinical status with further disposition pending results of labs.  I  discussed the assessment and treatment plan with the patient. The patient was provided an opportunity to ask questions and all were answered. The patient agreed with the plan and demonstrated an understanding of the instructions.  I provided over 30 minutes of exam, counseling, chart review and  complex critical decision making.                                                                    The patient was advised to call back or seek an in-person evaluation if the symptoms worsen or if the condition fails to improve as anticipated.     Kirtland Bouchard, MD

## 2021-08-10 NOTE — Patient Instructions (Signed)

## 2021-08-11 ENCOUNTER — Encounter: Payer: Self-pay | Admitting: Internal Medicine

## 2021-08-11 ENCOUNTER — Ambulatory Visit: Payer: Medicare Other | Admitting: Internal Medicine

## 2021-08-11 VITALS — BP 110/78 | HR 71 | Temp 97.9°F | Resp 16 | Ht 71.0 in | Wt 195.2 lb

## 2021-08-11 DIAGNOSIS — E559 Vitamin D deficiency, unspecified: Secondary | ICD-10-CM | POA: Diagnosis not present

## 2021-08-11 DIAGNOSIS — E785 Hyperlipidemia, unspecified: Secondary | ICD-10-CM

## 2021-08-11 DIAGNOSIS — E1122 Type 2 diabetes mellitus with diabetic chronic kidney disease: Secondary | ICD-10-CM | POA: Diagnosis not present

## 2021-08-11 DIAGNOSIS — Z79899 Other long term (current) drug therapy: Secondary | ICD-10-CM

## 2021-08-11 DIAGNOSIS — E1169 Type 2 diabetes mellitus with other specified complication: Secondary | ICD-10-CM

## 2021-08-11 DIAGNOSIS — I700 Atherosclerosis of aorta: Secondary | ICD-10-CM

## 2021-08-11 DIAGNOSIS — I10 Essential (primary) hypertension: Secondary | ICD-10-CM | POA: Diagnosis not present

## 2021-08-11 DIAGNOSIS — N1831 Chronic kidney disease, stage 3a: Secondary | ICD-10-CM

## 2021-08-11 DIAGNOSIS — M1000 Idiopathic gout, unspecified site: Secondary | ICD-10-CM

## 2021-08-12 LAB — HEMOGLOBIN A1C
Hgb A1c MFr Bld: 9 % of total Hgb — ABNORMAL HIGH (ref <5.7)
Mean Plasma Glucose: 212 mg/dL
eAG (mmol/L): 11.7 mmol/L

## 2021-08-12 LAB — LIPID PANEL
Cholesterol: 175 mg/dL (ref <200)
HDL: 23 mg/dL — ABNORMAL LOW (ref >=40)
Non-HDL Cholesterol (Calc): 152 mg/dL (calc) — ABNORMAL HIGH (ref <130)
Total CHOL/HDL Ratio: 7.6 (calc) — ABNORMAL HIGH (ref <5.0)
Triglycerides: 604 mg/dL — ABNORMAL HIGH (ref <150)

## 2021-08-12 LAB — CBC WITH DIFFERENTIAL/PLATELET
Absolute Monocytes: 301 cells/uL (ref 200–950)
Basophils Absolute: 70 cells/uL (ref 0–200)
Basophils Relative: 1.1 %
Eosinophils Absolute: 192 cells/uL (ref 15–500)
Eosinophils Relative: 3 %
HCT: 40 % (ref 38.5–50.0)
Hemoglobin: 13.5 g/dL (ref 13.2–17.1)
Lymphs Abs: 1542 cells/uL (ref 850–3900)
MCH: 31.5 pg (ref 27.0–33.0)
MCHC: 33.8 g/dL (ref 32.0–36.0)
MCV: 93.5 fL (ref 80.0–100.0)
MPV: 9.7 fL (ref 7.5–12.5)
Monocytes Relative: 4.7 %
Neutro Abs: 4294 cells/uL (ref 1500–7800)
Neutrophils Relative %: 67.1 %
Platelets: 146 10*3/uL (ref 140–400)
RBC: 4.28 10*6/uL (ref 4.20–5.80)
RDW: 13.5 % (ref 11.0–15.0)
Total Lymphocyte: 24.1 %
WBC: 6.4 10*3/uL (ref 3.8–10.8)

## 2021-08-12 LAB — COMPLETE METABOLIC PANEL WITH GFR
AG Ratio: 1.7 (calc) (ref 1.0–2.5)
ALT: 11 U/L (ref 9–46)
AST: 10 U/L (ref 10–35)
Albumin: 4.2 g/dL (ref 3.6–5.1)
Alkaline phosphatase (APISO): 85 U/L (ref 35–144)
BUN/Creatinine Ratio: 18 (calc) (ref 6–22)
BUN: 28 mg/dL — ABNORMAL HIGH (ref 7–25)
CO2: 18 mmol/L — ABNORMAL LOW (ref 20–32)
Calcium: 9.1 mg/dL (ref 8.6–10.3)
Chloride: 107 mmol/L (ref 98–110)
Creat: 1.52 mg/dL — ABNORMAL HIGH (ref 0.70–1.22)
Globulin: 2.5 g/dL (calc) (ref 1.9–3.7)
Glucose, Bld: 277 mg/dL — ABNORMAL HIGH (ref 65–99)
Potassium: 4.6 mmol/L (ref 3.5–5.3)
Sodium: 136 mmol/L (ref 135–146)
Total Bilirubin: 0.5 mg/dL (ref 0.2–1.2)
Total Protein: 6.7 g/dL (ref 6.1–8.1)
eGFR: 46 mL/min/{1.73_m2} — ABNORMAL LOW (ref >=60)

## 2021-08-12 LAB — URIC ACID: Uric Acid, Serum: 5.8 mg/dL (ref 4.0–8.0)

## 2021-08-12 LAB — TSH: TSH: 2.5 mIU/L (ref 0.40–4.50)

## 2021-08-12 LAB — MAGNESIUM: Magnesium: 1.9 mg/dL (ref 1.5–2.5)

## 2021-08-12 LAB — VITAMIN D 25 HYDROXY (VIT D DEFICIENCY, FRACTURES): Vit D, 25-Hydroxy: 24 ng/mL — ABNORMAL LOW (ref 30–100)

## 2021-08-12 LAB — INSULIN, RANDOM: Insulin: 16.9 u[IU]/mL

## 2021-08-12 NOTE — Progress Notes (Signed)
<><><><><><><><><><><><><><><><><><><><><><><><><><><><><><><><><> <><><><><><><><><><><><><><><><><><><><><><><><><><><><><><><><><>  - A1c = 9.0% is Way too High  !  And Glucose was 277 mg% when in Office   - It's a wonder that you haven' t    ended up in the Intensive care unit .   - You ABSOLUTELY must get on a stricter diet.   - There is no excuse for the sugar to be so high given the medicines that you are taking           <><><><><><><><><><><><><><><><><><><><><><><><><><><><><><><><><> <><><><><><><><><><><><><><><><><><><><><><><><><><><><><><><><><>  -  Total Chol =174  -is [place] ! But Triglycerides (  604    ) or fats in blood are too high  (goal is less than 150)    - Recommend avoid fried & greasy foods,  sweets / candy,   - Avoid white rice  (brown or wild rice or Quinoa is OK),   - Avoid white potatoes  (sweet potatoes are OK)   - Avoid anything made from white flour  - bagels, doughnuts, rolls, buns, biscuits, white and   wheat breads, pizza crust and traditional  pasta made of white flour & egg white  - (vegetarian pasta or spinach or wheat pasta is OK).    - Multi-grain bread is OK - like multi-grain flat bread or  sandwich thins.   - Avoid alcohol in excess.   - Exercise is also important. <><><><><><><><><><><><><><><><><><><><><><><><><><><><><><><><><>  - Vitamin D = 24 - Extremely   & Dangerously Low   - Vitamin D goal is between 70-100.   - Please START taking 10,000 units  /day  Vitamin D as directed.   - It is very important as a natural anti-inflammatory and helping the  immune system protect against viral infections, like the Covid-19    helping hair, skin, and nails, as well as reducing stroke and  heart attack risk.   - It helps your bones and helps with mood.  - It also decreases numerous cancer risks so please  take it as directed.   - Low Vit D is associated with a 200-300% higher risk for  CANCER   and 200-300% higher  risk for HEART   ATTACK  &  STROKE.    - It is also associated with higher death rate at younger ages,   autoimmune diseases like Rheumatoid arthritis, Lupus,  Multiple Sclerosis.     - Also many other serious conditions, like depression, Alzheimer's  Dementia, infertility, muscle aches, fatigue, fibromyalgia   - just to name a few. <><><><><><><><><><><><><><><><><><><><><><><><><><><><><><><><><>  - Uric Acid  / Gout  crystal test is Normal &  OK <><><><><><><><><><><><><><><><><><><><><><><><><><><><><><><><><>  - All Else - CBC - Electrolytes - Liver - Magnesium & Thyroid  - all  Normal / OK  - Except Kidney functions have dropped another 10 % to                                                          the bottom of Stage 3 a  Chronic Kidney    Disease    & the ONLY treatment is to get the Diabetes in Better Control  by being on a better diet & losing weight  !  <><><><><><><><><><><><><><><><><><><><><><><><><><><><><><><><><>

## 2021-08-13 NOTE — Progress Notes (Signed)
Please call  & Send Lab Letter

## 2022-01-16 ENCOUNTER — Encounter: Payer: Self-pay | Admitting: Internal Medicine

## 2022-01-16 NOTE — Progress Notes (Unsigned)
Annual  Screening/Preventative Visit  & Comprehensive Evaluation & Examination   Future Appointments  Date Time Provider Department  01/17/2022                      cpe 11:00 AM Unk Pinto, MD [facility]  05/09/2022                        [facility]  2:30 PM Darrol Jump, NP [facility]  08/24/2022                         ov                10:30 AM Unk Pinto, MD [facility]  01/18/2023                      cpe 11:00 AM Unk Pinto, MD [facility]            This very nice 81 y.o.  MWM presents for a Screening /Preventative Visit & comprehensive evaluation and management of multiple medical co-morbidities.  Patient has been followed for HTN, HLD, T2_NIDDM  and Vitamin D Deficiency. Patient has hx/o Gout quiescent on his meds. CT scan on 2018 showed Aortic Atherosclerosis . Patient has hx/o major Depressive Disorder currently in remission .       HTN predates  since 2004. Patient's BP has been controlled at home.  Today's BP was initially elevated & rechecked at goal  - 110/60 . Patient denies any cardiac symptoms as chest pain, palpitations, shortness of breath, dizziness or ankle swelling.       Patient's hyperlipidemia is controlled with diet and Rosuvastatin/fenofibrate . Patient denies myalgias or other medication SE's. 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  Patient admits poor dietary compliance. Patient denies reactive hypoglycemic symptoms, visual blurring, diabetic polys or paresthesias. Last A1c was not at goal :   Lab Results   Component Value Date   HGBA1C 9.0 (H) 08/11/2021         Finally, patient has history of Vitamin D Deficiency ("24" /2008) and patient admits poor med compliance . Last vitamin D was not at goal (70-100) :   Lab Results  Component Value Date   VD25OH 24 (L) 08/11/2021       Current Outpatient Medications on File Prior to Visit  Medication Sig   albuterol HFA inhaler    aspirin 81 MG tablet Take daily.   VITAMIN D 5000 units  Take 2 capsules (10,000 Units total) daily.   Chromium-Cinnamon [phone] MCG-MG  Take 2 capsules  daily.   famotidine 40 MG tablet Take  as needed.    fenofibrate  145 MG tablet TAKE 1 TABLET DAILY    FLONASE nasal spray Place 2 sprays into both nostrils daily.   glipiZIDE 10 MG tablet TAKE 1 TABLET  3 TIMES  losartan 100 MG tablet TAKE 1 TABLET DAILY    metFORMIN -XR 500 MG  TAKE 2 TABLETS TWICE A DAY    Multiple Vitamins-Minerals  Take 1 tablet  daily.   Potassium Citrate 15 MEQ Take 1 tablet 2 x /day to Prevent Kidney Stones   PROAIR HFA inhaler    rosuvastatin  5 MG tablet TAKE 1 TABLET  EVERY DAY    OZEMPIC, 1 MG/DOSE,) 4 MG/3ML  Inject 1 mg into the skin once a week.     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   followed by vascular-- dr Trula Slade (note in epic)   Environmental and seasonal allergies    External carotid artery stenosis     per last duplex 05-18-2011  right moderate stenosis and left moderate to severe   Hematuria    History of adenomatous polyp of colon    tubular adenoma's   History of bladder stone    History of DVT in adulthood    08/ 2009 right lower extremity;  2013  left lower extremity  (10-11-2018 per pt no dvt since 2013)   History of kidney stones    History of pulmonary  embolism    bilatera lung  08/ 2009  (10-11-2018  per pt no pe since 2009)   History of wheezing    allergies--- prn inhaler   Hypertension    Hypogonadism male    Internal carotid artery stenosis, bilateral     per last duplex 05-18-2011 in epic,   bilateral 40-59% stenosis  (10-11-2018  per pt denies stroke/tia S&S)   Lower urinary tract symptoms (LUTS)    Mixed hyperlipidemia    Nephrolithiasis    Testosterone deficiency    Type 2 diabetes mellitus ([place])    Vitamin D deficiency    Wears glasses      Health Maintenance  Topic Date Due   Zoster Vaccines- Shingrix (1 of 2) Never done   Pneumonia Vaccine 38+ Years old (2 - PPSV23 if available, else PCV20) 01/14/2016   OPHTHALMOLOGY EXAM  06/26/2020   COLONOSCOPY  10/25/2020   FOOT EXAM  12/14/2020   HEMOGLOBIN A1C  01/08/2021   TETANUS/TDAP  12/30/2026   INFLUENZA VACCINE  Completed   COVID-19 Vaccine  Completed   HPV VACCINES  Aged Out     Immunization History  Administered Date(s) Administered   Fluad Quad(high Dose) 11/23/2018   Influenza, High Dose  11/26/2015, 12/29/2016, 12/18/2017   Moderna Sars-Covid-2 Vacc 04/04/2019, 05/02/2019   Pneumococcal - 13 01/14/2015   Pneumococcal - 23 04/02/2008   Td 04/02/2006, 12/29/2016   Zoster, Live 04/03/2007    Last Colon - 10/25/2017 - Dr Havery Moros - Recc 3 yr f/u  ->                                                                    Over due Aug, 2022  Past Surgical History:  Procedure Laterality Date   COLONOSCOPY  last one 10-25-2017   CYSTOSCOPY WITH LITHOLAPAXY N/A 07/18/2016   Procedure: CYSTOSCOPY WITH LITHOLAPAXY;  Surgeon: Kathie Rhodes, MD;  Location[facility];  Service: Urology;  Laterality: N/A;   CYSTOSCOPY WITH LITHOLAPAXY N/A 10/15/2018   Procedure: CYSTOSCOPY WITH LITHOLAPAXY;  Surgeon: Kathie Rhodes, MD;  Location: [facility];   Service: Urology;  Laterality: N/A;   IVC FILTER INSERTION  11/21/2007   IVC FILTER REMOVAL  01/31/2008   ORCHIOPEXY Bilateral 1950's   undescended testis's   RIGHT CHEEK/MOUTH BIOPSY  09/16/2008  (office)   squamous hyperplasia w/ hyperkeratosis   RIGHT DEEP CERVICAL NODE BIOPSY  01/03/2001   right posterior neck ( lymphoid hyperplasia w/ florid follicular hyperplasia, no malignancy   TONSILLECTOMY  age 10     Family History  Problem Relation Age of Onset   Alzheimer's disease Mother    Diabetes Mother    Prostate cancer Father    Breast cancer Sister    Colon cancer Neg Hx    Rectal cancer Neg Hx    Stomach cancer Neg Hx    Esophageal cancer Neg Hx      Social History   Tobacco Use   Smoking status: Former    Packs/day: 1.00    Years: 20.00    Pack years: 20.00    Types: Cigarettes    Quit date: 04/16/1991    Years since quitting: 29.7   Smokeless tobacco: Never  Substance Use Topics   Alcohol use: Yes    Comment: ocassional    Drug use: Never      ROS Constitutional: Denies fever, chills, weight loss/gain, headaches, insomnia,  night sweats or change in appetite. Does c/o fatigue. Eyes: Denies redness, blurred vision, diplopia, discharge, itchy or watery eyes.  ENT: Denies discharge, congestion, post nasal drip, epistaxis, sore throat, earache, hearing loss, dental pain, Tinnitus, Vertigo, Sinus pain or snoring.  Cardio: Denies chest pain, palpitations, irregular heartbeat, syncope, dyspnea, diaphoresis, orthopnea, PND, claudication or edema Respiratory: denies cough, dyspnea, DOE, pleurisy, hoarseness, laryngitis or wheezing.  Gastrointestinal: Denies dysphagia, heartburn, reflux, water brash, pain, cramps, nausea, vomiting, bloating, diarrhea, constipation, hematemesis, melena, hematochezia, jaundice or hemorrhoids Genitourinary: Denies dysuria, frequency, discharge, hematuria or flank pain. Has urgency, nocturia x 1-3 & occasional hesitancy. Musculoskeletal:  Denies arthralgia, myalgia, stiffness, Jt. Swelling, pain, limp or strain/sprain. Denies Falls. Skin: Denies puritis, rash, hives, warts, acne, eczema or change in skin lesion Neuro: No weakness, tremor, incoordination, spasms, paresthesia or pain Psychiatric: Denies confusion, memory loss or sensory loss. Denies Depression. Endocrine: Denies change in weight, skin, hair change, nocturia, and paresthesia, diabetic polys, visual blurring or hyper / hypo glycemic episodes.  Heme/Lymph: No excessive bleeding, bruising or enlarged lymph nodes.   Physical Exam  BP 110/60   Pulse 75   Temp 97.8 F (36.6 C)   Resp 16   Ht '5\' 11"'$  (1.803 m)   Wt 193 lb 6.4 oz (87.7 kg)   SpO2 98%   BMI 26.97 kg/m   General Appearance: Well nourished and well groomed and in no apparent distress.  Eyes: PERRLA, EOMs, conjunctiva no swelling or erythema, normal fundi and vessels. Sinuses: No frontal/maxillary tenderness ENT/Mouth: EACs patent / TMs  nl. Nares clear without erythema, swelling, mucoid exudates. Oral hygiene is good. No erythema, swelling, or exudate. Tongue normal, non-obstructing. Tonsils not swollen or erythematous. Hearing normal.  Neck: Supple, thyroid not palpable. No bruits, nodes or JVD. Respiratory:  Respiratory effort normal.  BS equal and clear bilateral without rales, rhonci, wheezing or stridor. Cardio: Heart sounds are normal with regular rate and rhythm and no murmurs, rubs or gallops. Peripheral pulses are normal and equal bilaterally without edema. No aortic or femoral bruits. Chest: symmetric with normal excursions and percussion.  Abdomen: Soft, with Nl bowel sounds. Nontender, no guarding, rebound, hernias, masses, or organomegaly.  Lymphatics: Non tender without lymphadenopathy.  Musculoskeletal: Full ROM all peripheral extremities, joint stability, 5/5 strength, and normal gait. Skin: Warm and dry without rashes, lesions, cyanosis, clubbing or  ecchymosis.  Neuro: Cranial  nerves intact, reflexes equal bilaterally. Normal muscle tone, no cerebellar symptoms. Sensation intact to touch, but decreased to vibratory and Monofilament in a stocking distribution to the toes bilaterally. Pysch: Alert and oriented x 3 with normal affect, insight and judgment appropriate.   Assessment and Plan  1. Annual Preventative/Screening Exam    2. Essential hypertension  - EKG 12-Lead - [place], RETROPERITNL ABD,  LTD - Urinalysis, Routine w reflex microscopic - Microalbumin / creatinine urine ratio - CBC with Differential/Platelet - COMPLETE METABOLIC PANEL WITH GFR - Magnesium - TSH  3. Hyperlipidemia associated with type 2 diabetes mellitus ([place])  - EKG 12-Lead - Lipid panel - TSH  4. Type 2 diabetes mellitus with stage 3a chronic kidney  disease, without long-term current use of insulin (HCC)  - EKG 12-Lead - [place], RETROPERITNL ABD,  LTD - Urinalysis, Routine w reflex microscopic - Microalbumin / creatinine urine ratio - PTH, intact and calcium - Hemoglobin A1c - Insulin, random  5. Diabetic polyneuropathy associated with type 2 diabetes mellitus (HCC)  - EKG 12-Lead - [place], RETROPERITNL ABD,  LTD - HM DIABETES FOOT EXAM - PR LOW EXTEMITY NEUR EXAM DOCUM - Hemoglobin A1c  6. Vitamin D deficiency  - VITAMIN D 25 Hydroxy   7. Idiopathic gout  - Uric acid  8. Aortic atherosclerosis ([place]) by CT scan 2018  - EKG 12-Lead - [place], RETROPERITNL ABD,  LTD - Lipid panel  9. Recurrent major depressive disorder, in partial remission (HCC)  - TSH  10. BPH with obstruction/lower urinary tract symptoms  - PSA  11. Prostate cancer screening  - PSA  12. Screening for colorectal cancer   13. Screening for heart disease  - EKG 12-Lead  14. Former smoker  - EKG 12-Lead - [place], RETROPERITNL ABD,  LTD  15. Screening for AAA (aortic abdominal aneurysm)  - [place], RETROPERITNL ABD,  LTD  16. Medication management - Urinalysis, Routine w reflex  microscopic - Microalbumin / creatinine urine ratio - CBC with Differential/Platelet - COMPLETE METABOLIC PANEL WITH GFR - Magnesium - Lipid panel - TSH - Hemoglobin A1c - Insulin, random - VITAMIN D 25 Hydroxy           Patient was counseled in prudent diet, weight control to achieve/maintain BMI less than 25, BP monitoring, regular exercise and medications as discussed.  Discussed med effects and SE's. Routine screening labs and tests as requested with regular follow-up as recommended. Over 40 minutes of exam, counseling, chart review and high complex critical decision making was performed   Kirtland Bouchard, MD

## 2022-01-16 NOTE — Patient Instructions (Signed)

## 2022-01-17 ENCOUNTER — Encounter: Payer: Self-pay | Admitting: Internal Medicine

## 2022-01-17 ENCOUNTER — Ambulatory Visit: Payer: Medicare Other | Admitting: Internal Medicine

## 2022-01-17 VITALS — BP 110/60 | HR 75 | Temp 97.8°F | Resp 16 | Ht 71.0 in | Wt 193.4 lb

## 2022-01-17 DIAGNOSIS — N138 Other obstructive and reflux uropathy: Secondary | ICD-10-CM

## 2022-01-17 DIAGNOSIS — I700 Atherosclerosis of aorta: Secondary | ICD-10-CM | POA: Diagnosis not present

## 2022-01-17 DIAGNOSIS — Z79899 Other long term (current) drug therapy: Secondary | ICD-10-CM

## 2022-01-17 DIAGNOSIS — Z125 Encounter for screening for malignant neoplasm of prostate: Secondary | ICD-10-CM

## 2022-01-17 DIAGNOSIS — E1169 Type 2 diabetes mellitus with other specified complication: Secondary | ICD-10-CM

## 2022-01-17 DIAGNOSIS — E559 Vitamin D deficiency, unspecified: Secondary | ICD-10-CM

## 2022-01-17 DIAGNOSIS — Z87891 Personal history of nicotine dependence: Secondary | ICD-10-CM | POA: Diagnosis not present

## 2022-01-17 DIAGNOSIS — Z0000 Encounter for general adult medical examination without abnormal findings: Secondary | ICD-10-CM

## 2022-01-17 DIAGNOSIS — E1122 Type 2 diabetes mellitus with diabetic chronic kidney disease: Secondary | ICD-10-CM

## 2022-01-17 DIAGNOSIS — F3341 Major depressive disorder, recurrent, in partial remission: Secondary | ICD-10-CM

## 2022-01-17 DIAGNOSIS — I10 Essential (primary) hypertension: Secondary | ICD-10-CM | POA: Diagnosis not present

## 2022-01-17 DIAGNOSIS — Z136 Encounter for screening for cardiovascular disorders: Secondary | ICD-10-CM

## 2022-01-17 DIAGNOSIS — Z1211 Encounter for screening for malignant neoplasm of colon: Secondary | ICD-10-CM

## 2022-01-17 DIAGNOSIS — Z0001 Encounter for general adult medical examination with abnormal findings: Secondary | ICD-10-CM

## 2022-01-17 DIAGNOSIS — E1142 Type 2 diabetes mellitus with diabetic polyneuropathy: Secondary | ICD-10-CM

## 2022-01-17 DIAGNOSIS — M1000 Idiopathic gout, unspecified site: Secondary | ICD-10-CM

## 2022-01-18 LAB — COMPLETE METABOLIC PANEL WITH GFR
AG Ratio: 1.8 (calc) (ref 1.0–2.5)
ALT: 12 U/L (ref 9–46)
AST: 11 U/L (ref 10–35)
Albumin: 4.5 g/dL (ref 3.6–5.1)
Alkaline phosphatase (APISO): 69 U/L (ref 35–144)
BUN/Creatinine Ratio: 20 (calc) (ref 6–22)
BUN: 27 mg/dL — ABNORMAL HIGH (ref 7–25)
CO2: 19 mmol/L — ABNORMAL LOW (ref 20–32)
Calcium: 9.4 mg/dL (ref 8.6–10.3)
Chloride: 110 mmol/L (ref 98–110)
Creat: 1.33 mg/dL — ABNORMAL HIGH (ref 0.70–1.22)
Globulin: 2.5 g/dL (calc) (ref 1.9–3.7)
Glucose, Bld: 277 mg/dL — ABNORMAL HIGH (ref 65–99)
Potassium: 5.1 mmol/L (ref 3.5–5.3)
Sodium: 138 mmol/L (ref 135–146)
Total Bilirubin: 0.5 mg/dL (ref 0.2–1.2)
Total Protein: 7 g/dL (ref 6.1–8.1)
eGFR: 54 mL/min/{1.73_m2} — ABNORMAL LOW (ref >=60)

## 2022-01-18 LAB — URINALYSIS, ROUTINE W REFLEX MICROSCOPIC
Bilirubin Urine: NEGATIVE
Hgb urine dipstick: NEGATIVE
Ketones, ur: NEGATIVE
Leukocytes,Ua: NEGATIVE
Nitrite: NEGATIVE
Protein, ur: NEGATIVE
Specific Gravity, Urine: 1.018 (ref 1.001–1.035)
pH: 5.5 (ref 5.0–8.0)

## 2022-01-18 LAB — CBC WITH DIFFERENTIAL/PLATELET
Absolute Monocytes: 308 cells/uL (ref 200–950)
Basophils Absolute: 60 cells/uL (ref 0–200)
Basophils Relative: 0.9 %
Eosinophils Absolute: 147 cells/uL (ref 15–500)
Eosinophils Relative: 2.2 %
HCT: 37.3 % — ABNORMAL LOW (ref 38.5–50.0)
Hemoglobin: 12.7 g/dL — ABNORMAL LOW (ref 13.2–17.1)
Lymphs Abs: 1682 cells/uL (ref 850–3900)
MCH: 31.8 pg (ref 27.0–33.0)
MCHC: 34 g/dL (ref 32.0–36.0)
MCV: 93.5 fL (ref 80.0–100.0)
MPV: 9.4 fL (ref 7.5–12.5)
Monocytes Relative: 4.6 %
Neutro Abs: 4502 cells/uL (ref 1500–7800)
Neutrophils Relative %: 67.2 %
Platelets: 152 10*3/uL (ref 140–400)
RBC: 3.99 10*6/uL — ABNORMAL LOW (ref 4.20–5.80)
RDW: 12.7 % (ref 11.0–15.0)
Total Lymphocyte: 25.1 %
WBC: 6.7 10*3/uL (ref 3.8–10.8)

## 2022-01-18 LAB — LIPID PANEL
Cholesterol: 88 mg/dL (ref <200)
HDL: 31 mg/dL — ABNORMAL LOW (ref >=40)
LDL Cholesterol (Calc): 36 mg/dL (calc)
Non-HDL Cholesterol (Calc): 57 mg/dL (calc) (ref <130)
Total CHOL/HDL Ratio: 2.8 (calc) (ref <5.0)
Triglycerides: 125 mg/dL (ref <150)

## 2022-01-18 LAB — URIC ACID: Uric Acid, Serum: 3.8 mg/dL — ABNORMAL LOW (ref 4.0–8.0)

## 2022-01-18 LAB — MICROALBUMIN / CREATININE URINE RATIO
Creatinine, Urine: 67 mg/dL (ref 20–320)
Microalb Creat Ratio: 18 mcg/mg creat (ref <30)
Microalb, Ur: 1.2 mg/dL

## 2022-01-18 LAB — HEMOGLOBIN A1C
Hgb A1c MFr Bld: 8.3 % of total Hgb — ABNORMAL HIGH (ref <5.7)
Mean Plasma Glucose: 192 mg/dL
eAG (mmol/L): 10.6 mmol/L

## 2022-01-18 LAB — INSULIN, RANDOM: Insulin: 24.5 u[IU]/mL — ABNORMAL HIGH

## 2022-01-18 LAB — TSH: TSH: 2.1 mIU/L (ref 0.40–4.50)

## 2022-01-18 LAB — VITAMIN D 25 HYDROXY (VIT D DEFICIENCY, FRACTURES): Vit D, 25-Hydroxy: 31 ng/mL (ref 30–100)

## 2022-01-18 LAB — PTH, INTACT AND CALCIUM
Calcium: 9.4 mg/dL (ref 8.6–10.3)
PTH: 38 pg/mL (ref 16–77)

## 2022-01-18 LAB — PSA: PSA: 0.25 ng/mL (ref <=4.00)

## 2022-01-18 LAB — MAGNESIUM: Magnesium: 1.8 mg/dL (ref 1.5–2.5)

## 2022-01-19 ENCOUNTER — Other Ambulatory Visit: Payer: Self-pay | Admitting: Internal Medicine

## 2022-01-19 DIAGNOSIS — N1831 Chronic kidney disease, stage 3a: Secondary | ICD-10-CM

## 2022-01-19 DIAGNOSIS — N401 Enlarged prostate with lower urinary tract symptoms: Secondary | ICD-10-CM

## 2022-01-19 MED ORDER — FINASTERIDE 5 MG PO TABS: ORAL_TABLET | ORAL | 3 refills | Status: AC

## 2022-01-19 MED ORDER — TAMSULOSIN HCL 0.4 MG PO CAPS: ORAL_CAPSULE | ORAL | 3 refills | Status: DC

## 2022-01-19 MED ORDER — OZEMPIC (2 MG/DOSE) 8 MG/3ML ~~LOC~~ SOPN: PEN_INJECTOR | SUBCUTANEOUS | 3 refills | Status: DC

## 2022-01-21 ENCOUNTER — Other Ambulatory Visit: Payer: Self-pay | Admitting: Internal Medicine

## 2022-01-21 NOTE — Progress Notes (Signed)
No answer and voicemail full. -e welch

## 2022-01-31 ENCOUNTER — Encounter: Payer: Self-pay | Admitting: Nurse Practitioner

## 2022-01-31 ENCOUNTER — Ambulatory Visit: Payer: Medicare Other | Admitting: Nurse Practitioner

## 2022-01-31 VITALS — BP 122/62 | HR 85 | Temp 97.7°F | Ht 71.0 in | Wt 196.0 lb

## 2022-01-31 DIAGNOSIS — H00014 Hordeolum externum left upper eyelid: Secondary | ICD-10-CM | POA: Diagnosis not present

## 2022-01-31 DIAGNOSIS — H00036 Abscess of eyelid left eye, unspecified eyelid: Secondary | ICD-10-CM | POA: Diagnosis not present

## 2022-01-31 DIAGNOSIS — I10 Essential (primary) hypertension: Secondary | ICD-10-CM

## 2022-01-31 MED ORDER — FLUTICASONE PROPIONATE 50 MCG/ACT NA SUSP: 2.0000 | Freq: Every day | NASAL | 2 refills | Status: DC

## 2022-01-31 MED ORDER — CEPHALEXIN 500 MG PO CAPS: 500.0000 mg | ORAL_CAPSULE | Freq: Two times a day (BID) | ORAL | 0 refills | Status: AC

## 2022-01-31 NOTE — Patient Instructions (Signed)
Stye ?A stye, also known as a hordeolum, is a bump that forms on an eyelid. It may look like a pimple next to the eyelash. A stye can form inside the eyelid (internal stye) or outside the eyelid (external stye). A stye can cause redness, swelling, and pain on the eyelid. ?Styes are very common. Anyone can get them at any age. They usually occur in just one eye at a time, but you may have more than one in either eye. ?What are the causes? ?A stye is caused by an infection. The infection is almost always caused by bacteria called Staphylococcus aureus. This is a common type of bacteria that lives on the skin. ?An internal stye may result from an infected oil-producing gland inside the eyelid. An external stye may be caused by an infection at the base of the eyelash (hair follicle). ?What increases the risk? ?You are more likely to develop a stye if: ?You have had a stye before. ?You have any of these conditions: ?Red, itchy, inflamed eyelids (blepharitis). ?A skin condition such as seborrheic dermatitis or rosacea. ?High fat levels in your blood (lipids). ?Dry eyes. ?What are the signs or symptoms? ?The most common symptom of a stye is eyelid pain. Internal styes are more painful than external styes. Other symptoms may include: ?Painful swelling of your eyelid. ?A scratchy feeling in your eye. ?Tearing and redness of your eye. ?A pimple-like bump on the edge of the eyelid. ?Pus draining from the stye. ?How is this diagnosed? ?Your health care provider may be able to diagnose a stye just by examining your eye. The health care provider may also check to make sure: ?You do not have a fever or other signs of a more serious infection. ?The infection has not spread to other parts of your eye or areas around your eye. ?How is this treated? ?Most styes will clear up in a few days without treatment or with warm compresses applied to the area. You may need to use antibiotic drops or ointment to treat an infection. Sometimes,  steroid drops or ointment are used in addition to antibiotics. ?In some cases, your health care provider may give you a small steroid injection in the eyelid. ?If your stye does not heal with routine treatment, your health care provider may drain pus from the stye using a thin blade or needle. This may be done if the stye is large, causing a lot of pain, or affecting your vision. ?Follow these instructions at home: ?Take over-the-counter and prescription medicines only as told by your health care provider. This includes eye drops or ointments. ?If you were prescribed an antibiotic medicine, steroid medicine, or both, apply or use them as told by your health care provider. Do not stop using the medicine even if your condition improves. ?Apply a warm, wet cloth (warm compress) to your eye for 5-10 minutes, 4 to 6 times a day. ?Clean the affected eyelid as directed by your health care provider. ?Do not wear contact lenses or eye makeup until your stye has healed and your health care provider says that it is safe. ?Do not try to pop or drain the stye. ?Do not rub your eye. ?Contact a health care provider if: ?You have chills or a fever. ?Your stye does not go away after several days. ?Your stye affects your vision. ?Your eyeball becomes swollen, red, or painful. ?Get help right away if: ?You have pain when moving your eye around. ?Summary ?A stye is a bump that forms   on an eyelid. It may look like a pimple next to the eyelash. ?A stye can form inside the eyelid (internal stye) or outside the eyelid (external stye). A stye can cause redness, swelling, and pain on the eyelid. ?Your health care provider may be able to diagnose a stye just by examining your eye. ?Apply a warm, wet cloth (warm compress) to your eye for 5-10 minutes, 4 to 6 times a day. ?This information is not intended to replace advice given to you by your health care provider. Make sure you discuss any questions you have with your health care  provider. ?Document Revised: 05/13/2020 Document Reviewed: 05/13/2020 ?Elsevier Patient Education ? 2023 Elsevier Inc[employer] ? ?

## 2022-01-31 NOTE — Progress Notes (Signed)
Assessment and Plan:  There are no diagnoses linked to this encounter. Joshua Peterson was seen today for stye.  Diagnoses and all orders for this visit:  Essential hypertension - continue medications, DASH diet, exercise and monitor at home. Call if greater than 130/80.    Hordeolum externum of left upper eyelid Warm compresses to eyelid for 20 minutes three times a day -     cephALEXin (KEFLEX) 500 MG capsule; Take 1 capsule (500 mg total) by mouth 2 (two) times daily for 10 days.  Cellulitis of eyelid, left If redness worsens, eye swells shut or develops drainage, fever notify the office -     cephALEXin (KEFLEX) 500 MG capsule; Take 1 capsule (500 mg total) by mouth 2 (two) times daily for 10 days.  Allergies -     fluticasone (FLONASE) 50 MCG/ACT nasal spray; Place 2 sprays into both nostrils daily.      Further disposition pending results of labs. Discussed med's effects and SE's.   Over 30 minutes of exam, counseling, chart review, and critical decision making was performed.   Future Appointments  Date Time Provider [place]  01/31/2022  4:30 PM Alycia Rossetti, NP [facility] None  05/09/2022  2:30 PM Darrol Jump, NP [facility] None  08/09/2022  2:30 PM Unk Pinto, MD [facility] None  11/10/2022  2:30 PM Darrol Jump, NP [facility] None  02/13/2023  2:00 PM Unk Pinto, MD [facility] None    ------------------------------------------------------------------------------------------------------------------   HPI BP 122/62   Pulse 85   Temp 97.7 F (36.5 C)   Ht '5\' 11"'$  (1.803 m)   Wt 196 lb (88.9 kg)   SpO2 98%   BMI 27.34 kg/m    81 y.o.male presents for possible sty in his left eye. 4 days ago he developed sensitivity in his left eye with associated redness.  It is occasionally itchy to the touch.    BP is well controlled with Losartan 25 mg QD  Denies headaches, chest pain, and shortness of breath.  BP Readings from Last 3 Encounters:   01/31/22 122/62  01/17/22 110/60  08/11/21 110/78   BMI is Body mass index is 27.34 kg/m., he has been working on diet and exercise. Wt Readings from Last 3 Encounters:  01/31/22 196 lb (88.9 kg)  01/17/22 193 lb 6.4 oz (87.7 kg)  08/11/21 195 lb 3.2 oz (88.5 kg)     Past Medical History:  Diagnosis Date   Allergic rhinitis    Bladder stones    CKD (chronic kidney disease), stage III ([place])    Common iliac aneurysm (HCC) right side   followed by vascular-- dr Trula Slade (note in epic)   Environmental and seasonal allergies    External carotid artery stenosis     per last duplex 05-18-2011  right moderate stenosis and left moderate to severe   Hematuria    History of adenomatous polyp of colon    tubular adenoma's   History of bladder stone    History of DVT in adulthood    08/ 2009 right lower extremity;  2013  left lower extremity  (10-11-2018 per pt no dvt since 2013)   History of kidney stones    History of pulmonary embolism    bilatera lung  08/ 2009  (10-11-2018  per pt no pe since 2009)   History of wheezing    allergies--- prn inhaler   Hypertension    Hypogonadism male    Internal carotid artery stenosis, bilateral     per last  duplex 05-18-2011 in epic,   bilateral 40-59% stenosis  (10-11-2018  per pt denies stroke/tia S&S)   Lower urinary tract symptoms (LUTS)    Mixed hyperlipidemia    Nephrolithiasis    Testosterone deficiency    Type 2 diabetes mellitus (HCC)    Vitamin D deficiency    Wears glasses      Allergies  Allergen Reactions   Doxycycline     unknown   Niacin And Related Other (See Comments)    flushing   Sulfa Antibiotics Itching    Current Outpatient Medications on File Prior to Visit  Medication Sig   aspirin 81 MG tablet Take 81 mg by mouth daily.   Cholecalciferol (VITAMIN D3) 5000 units CAPS Take 2 capsules (10,000 Units total) by mouth daily. (Patient taking differently: Take 2 capsules by mouth daily.)   Chromium-Cinnamon  (CINNAMON PLUS CHROMIUM) [phone] MCG-MG CAPS Take 2 capsules by mouth daily.   famotidine (PEPCID) 40 MG tablet Take 40 mg by mouth as needed.   fenofibrate (TRICOR) 145 MG tablet TAKE 1 TABLET DAILY FOR TRIGLYCERIDES (BLOOD FATS)   finasteride (PROSCAR) 5 MG tablet Take  1 tablet  Daily  for Prostate   fluticasone (FLONASE) 50 MCG/ACT nasal spray Place 2 sprays into both nostrils daily.   glipiZIDE (GLUCOTROL) 10 MG tablet TAKE 1 TABLET BY MOUTH 3 TIMES A DAY WITH MEALS FOR DIABETES   losartan (COZAAR) 25 MG tablet TAKE 1 TABLET DAILY FOR BLOOD PRESSURE GOAL <140/80 AND KIDNEY PROTECTION.   metFORMIN (GLUCOPHAGE-XR) 500 MG 24 hr tablet TAKE 2 TABLETS TWICE A DAY WITH MEALS FOR DIABETES   Multiple Vitamins-Minerals (MEGA MULTIVITAMIN FOR MEN) TABS Take 1 tablet by mouth daily.   ONETOUCH ULTRA test strip TEST BLOOD SUGAR ONCE DAILY FOR DIABETES   Potassium Citrate 15 MEQ (1620 MG) TBCR Take 1 tablet 2 x /day to Prevent Kidney Stones   rosuvastatin (CRESTOR) 5 MG tablet TAKE 1 TABLET BY MOUTH EVERY DAY FOR CHOLESTEROL   Semaglutide, 2 MG/DOSE, (OZEMPIC, 2 MG/DOSE,) 8 MG/3ML SOPN Inject  2 mg  into skin  weekly  for Diabetes   tamsulosin (FLOMAX) 0.4 MG CAPS capsule Take  1 tablet  at Bedtime  for Prostate   No current facility-administered medications on file prior to visit.    ROS: all negative except above.   Physical Exam:  BP 122/62   Pulse 85   Temp 97.7 F (36.5 C)   Ht '5\' 11"'$  (1.803 m)   Wt 196 lb (88.9 kg)   SpO2 98%   BMI 27.34 kg/m   General Appearance: Well nourished, in no apparent distress. Eyes: PERRLA, Left upper eyelid erythematous and swollen, very small stye noted middle of upper eyelid- no drainage Sinuses: No Frontal/maxillary tenderness Neck: Supple, thyroid normal.  Respiratory: Respiratory effort normal, BS equal bilaterally without rales, rhonchi, wheezing or stridor.  Cardio: RRR with no MRGs. Brisk peripheral pulses without edema.  Abdomen: Soft, + BS.   Non tender, no guarding, rebound, hernias, masses. Lymphatics: Non tender without lymphadenopathy.  Musculoskeletal: Full ROM, 5/5 strength, normal gait.  Skin: Warm, dry without rashes, lesions, ecchymosis.  Neuro: Cranial nerves intact. Normal muscle tone, no cerebellar symptoms. Sensation intact.  Psych: Awake and oriented X 3, normal affect, Insight and Judgment appropriate.     Alycia Rossetti, NP 4:27 PM [facility]

## 2022-02-01 ENCOUNTER — Ambulatory Visit: Payer: Medicare Other | Admitting: Internal Medicine

## 2022-02-01 NOTE — Progress Notes (Signed)
Patient is aware of lab results and instructions. -e welch

## 2022-02-02 NOTE — Progress Notes (Signed)
Assessment and Plan:  Joshua Peterson was seen today for stye.  Diagnoses and all orders for this visit:  Essential hypertension - continue medications, DASH diet, exercise and monitor at home. Call if greater than 130/80.    Hordeolum externum of left upper eyelid/Cellulitis of eyelid, left Redness and swelling has greatly improved Finish course of keflex If symptoms return notify the office      Further disposition pending results of labs. Discussed med's effects and SE's.   Over 30 minutes of exam, counseling, chart review, and critical decision making was performed.   Future Appointments  Date Time Provider [place]  05/09/2022  2:30 PM Darrol Jump, NP [facility] None  08/09/2022  2:30 PM Unk Pinto, MD [facility] None  11/10/2022  2:30 PM Darrol Jump, NP [facility] None  02/13/2023  2:00 PM Unk Pinto, MD [facility] None    ------------------------------------------------------------------------------------------------------------------   HPI BP 124/60   Pulse 75   Temp (!) 97.5 F (36.4 C)   Ht '5\' 11"'$  (1.803 m)   Wt 193 lb 9.6 oz (87.8 kg)   SpO2 98%   BMI 27.00 kg/m    81 y.o.male presents for reevaluation of left eye cellulitis/stye.  Began treatment with antibiotic, Keflex 500 mg BID x 10,  4 days ago and has been applying warm compresses. Redness has resolved and swelling is greatly improved.   BP is well controlled with Losartan 25 mg QD  Denies headaches, chest pain, dizziness and shortness of breath.  BP Readings from Last 3 Encounters:  02/04/22 124/60  01/31/22 122/62  01/17/22 110/60   BMI is Body mass index is 27 kg/m., he has been working on diet and exercise. Wt Readings from Last 3 Encounters:  02/04/22 193 lb 9.6 oz (87.8 kg)  01/31/22 196 lb (88.9 kg)  01/17/22 193 lb 6.4 oz (87.7 kg)     Past Medical History:  Diagnosis Date   Allergic rhinitis    Bladder stones    CKD (chronic kidney disease), stage III (HCC)     Common iliac aneurysm (HCC) right side   followed by vascular-- dr Trula Slade (note in epic)   Environmental and seasonal allergies    External carotid artery stenosis     per last duplex 05-18-2011  right moderate stenosis and left moderate to severe   Hematuria    History of adenomatous polyp of colon    tubular adenoma's   History of bladder stone    History of DVT in adulthood    08/ 2009 right lower extremity;  2013  left lower extremity  (10-11-2018 per pt no dvt since 2013)   History of kidney stones    History of pulmonary embolism    bilatera lung  08/ 2009  (10-11-2018  per pt no pe since 2009)   History of wheezing    allergies--- prn inhaler   Hypertension    Hypogonadism male    Internal carotid artery stenosis, bilateral     per last duplex 05-18-2011 in epic,   bilateral 40-59% stenosis  (10-11-2018  per pt denies stroke/tia S&S)   Lower urinary tract symptoms (LUTS)    Mixed hyperlipidemia    Nephrolithiasis    Testosterone deficiency    Type 2 diabetes mellitus (HCC)    Vitamin D deficiency    Wears glasses      Allergies  Allergen Reactions   Doxycycline     unknown   Niacin And Related Other (See Comments)    flushing   Sulfa  Antibiotics Itching    Current Outpatient Medications on File Prior to Visit  Medication Sig   aspirin 81 MG tablet Take 81 mg by mouth daily.   cephALEXin (KEFLEX) 500 MG capsule Take 1 capsule (500 mg total) by mouth 2 (two) times daily for 10 days.   Cholecalciferol (VITAMIN D3) 5000 units CAPS Take 2 capsules (10,000 Units total) by mouth daily. (Patient taking differently: Take 2 capsules by mouth daily.)   Chromium-Cinnamon (CINNAMON PLUS CHROMIUM) [phone] MCG-MG CAPS Take 2 capsules by mouth daily.   famotidine (PEPCID) 40 MG tablet Take 40 mg by mouth as needed.   fenofibrate (TRICOR) 145 MG tablet TAKE 1 TABLET DAILY FOR TRIGLYCERIDES (BLOOD FATS)   finasteride (PROSCAR) 5 MG tablet Take  1 tablet  Daily  for Prostate    fluticasone (FLONASE) 50 MCG/ACT nasal spray Place 2 sprays into both nostrils daily.   glipiZIDE (GLUCOTROL) 10 MG tablet TAKE 1 TABLET BY MOUTH 3 TIMES A DAY WITH MEALS FOR DIABETES   losartan (COZAAR) 25 MG tablet TAKE 1 TABLET DAILY FOR BLOOD PRESSURE GOAL <140/80 AND KIDNEY PROTECTION.   metFORMIN (GLUCOPHAGE-XR) 500 MG 24 hr tablet TAKE 2 TABLETS TWICE A DAY WITH MEALS FOR DIABETES   Multiple Vitamins-Minerals (MEGA MULTIVITAMIN FOR MEN) TABS Take 1 tablet by mouth daily.   ONETOUCH ULTRA test strip TEST BLOOD SUGAR ONCE DAILY FOR DIABETES   ranitidine (ZANTAC) 300 MG tablet Take 1 tablet by mouth at bedtime as needed.   rosuvastatin (CRESTOR) 5 MG tablet TAKE 1 TABLET BY MOUTH EVERY DAY FOR CHOLESTEROL   Semaglutide, 2 MG/DOSE, (OZEMPIC, 2 MG/DOSE,) 8 MG/3ML SOPN Inject  2 mg  into skin  weekly  for Diabetes   tamsulosin (FLOMAX) 0.4 MG CAPS capsule Take  1 tablet  at Bedtime  for Prostate   No current facility-administered medications on file prior to visit.    ROS: all negative except above.   Physical Exam:  BP 124/60   Pulse 75   Temp (!) 97.5 F (36.4 C)   Ht '5\' 11"'$  (1.803 m)   Wt 193 lb 9.6 oz (87.8 kg)   SpO2 98%   BMI 27.00 kg/m   General Appearance: Well nourished, in no apparent distress. Eyes: PERRLA, Left upper eyelid mild swelling, redness has resolved Sinuses: No Frontal/maxillary tenderness Neck: Supple, thyroid normal.  Respiratory: Respiratory effort normal, BS equal bilaterally without rales, rhonchi, wheezing or stridor.  Cardio: RRR with no MRGs. Brisk peripheral pulses without edema.  Abdomen: Soft, + BS.  Non tender, no guarding, rebound, hernias, masses. Lymphatics: Non tender without lymphadenopathy.  Musculoskeletal: Full ROM, 5/5 strength, normal gait.  Skin: Warm, dry . Left eyelid mild edema, redness resolved Neuro: Cranial nerves intact. Normal muscle tone, no cerebellar symptoms. Sensation intact.  Psych: Awake and oriented X 3, normal  affect, Insight and Judgment appropriate.     Joshua Rossetti, NP 11:41 AM [facility]

## 2022-02-04 ENCOUNTER — Encounter: Payer: Self-pay | Admitting: Nurse Practitioner

## 2022-02-04 ENCOUNTER — Ambulatory Visit (INDEPENDENT_AMBULATORY_CARE_PROVIDER_SITE_OTHER): Payer: Medicare Other | Admitting: Nurse Practitioner

## 2022-02-04 VITALS — BP 124/60 | HR 75 | Temp 97.5°F | Ht 71.0 in | Wt 193.6 lb

## 2022-02-04 DIAGNOSIS — H00036 Abscess of eyelid left eye, unspecified eyelid: Secondary | ICD-10-CM | POA: Diagnosis not present

## 2022-02-04 DIAGNOSIS — H00014 Hordeolum externum left upper eyelid: Secondary | ICD-10-CM | POA: Diagnosis not present

## 2022-02-04 DIAGNOSIS — I10 Essential (primary) hypertension: Secondary | ICD-10-CM | POA: Diagnosis not present

## 2022-02-16 ENCOUNTER — Encounter: Payer: Self-pay | Admitting: Internal Medicine

## 2022-02-21 ENCOUNTER — Encounter: Payer: Self-pay | Admitting: Gastroenterology

## 2022-04-18 ENCOUNTER — Ambulatory Visit: Payer: Medicare Other | Admitting: Gastroenterology

## 2022-04-18 ENCOUNTER — Encounter: Payer: Self-pay | Admitting: Gastroenterology

## 2022-04-18 VITALS — BP 114/76 | HR 78 | Ht 71.0 in | Wt 193.0 lb

## 2022-04-18 DIAGNOSIS — Z86010 Personal history of colonic polyps: Secondary | ICD-10-CM | POA: Diagnosis not present

## 2022-04-18 DIAGNOSIS — K862 Cyst of pancreas: Secondary | ICD-10-CM | POA: Diagnosis not present

## 2022-04-18 MED ORDER — CITRUCEL PO POWD: 1.0000 | Freq: Every day | ORAL | Status: AC

## 2022-04-18 NOTE — Patient Instructions (Addendum)
If you are age 82 or older, your body mass index should be between 23-30. Your Body mass index is 26.92 kg/m. If this is out of the aforementioned range listed, please consider follow up with your Primary Care Provider.  If you are age 34 or younger, your body mass index should be between 19-25. Your Body mass index is 26.92 kg/m. If this is out of the aformentioned range listed, please consider follow up with your Primary Care Provider.   ________________________________________________________   Please purchase the following medications over the counter and take as directed: Citrucel or Metamucil : Take once daily  You will be due for an MRCP in March to monitor your pancreatic cyst. [facility] scheduling will contact you to make an appointment to have this done. Their number is [phone] when you see that number on your phone.   Thank you for entrusting me with your care and for choosing [facility], Dr. [place] Cellar

## 2022-04-18 NOTE — Progress Notes (Signed)
HPI :  82 year old male with a history of colon polyps, pancreatic cyst, CKD, medical history as outlined below, here to reestablish care and discuss if he wishes to pursue any further surveillance colonoscopy exams as well as his history of pancreatic cysts.  He was last seen in August 2019 for routine surveillance colonoscopy.  Results as below.  He had 9 small adenomas, largest 5 mm in size.  He states in general since that time has been doing pretty well.  He denies any problems with his bowels that bother him.  He has been on Ozempic for diabetes which he states is helping his diabetes but alters his bowel slightly.  Some days he will have loose stools, other days he may be more constipated.  No blood in the stools.  He denies any abdominal pains.  There is no family history of colon cancer.  He denies any cardiopulmonary symptoms.  States he has never had any problems with his heart.  He states he would prefer to not have any further colonoscopies if that is possible, at his age.  Otherwise it appears he had an ultrasound back in December 2021 for some intermittent abdominal pains.  He was found to have gallstones but more notably changes in his pancreas which led to an MR I.  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  A repeat MRI was done in August 2022 which showed a grossly stable appearance of his pancreas with similar changes.  He had a surveillance MRCP last done in March 2023.  There are changes of chronic pancreatitis with severe pancreatic atrophy and a tortuous dilated main pancreatic duct which had increased in diameter compared to previous.  He had numerous cystic lesions, the largest was 25 mm in size at the uncinate process.  Radiology recommended a follow-up MRI in 12 months.  This is the first time seeing him since he has  had these exams done within the past 2 years.  He denies any family history of pancreatic cancer or pancreatitis.  He denies any known history of pancreatitis himself.  He does have a tobacco history in the past but quit.  He denies any significant alcohol use.  He denies any abdominal pains.  Denies any symptoms of steatorrhea or pancreatic insufficiency otherwise.  He states on Ozempic he is lost about 5 pounds but he attributes that to the medication.  Of note hepatic steatosis noted on prior imaging, LFTs normal.    Colonoscopy 10/25/2017: The perianal and digital rectal examinations were                            normal.                           The terminal ileum appeared normal.                           Two sessile polyps were found in the cecum. The                            polyps were 3 to 4 mm in size. These polyps were  removed with a cold snare. Resection and retrieval                            were complete.                           Four sessile polyps were found in the ascending                            colon. The polyps were 3 to 5 mm in size. These                            polyps were removed with a cold snare. Resection                            and retrieval were complete.                           Three sessile polyps were found in the sigmoid                            colon. The polyps were 3 to 4 mm in size. These                            polyps were removed with a cold snare. Resection                            and retrieval were complete.                           Internal hemorrhoids were found during retroflexion.                           The exam was otherwise without abnormality.  Surgical [P], ascending and cecum, sigmoid, polyp (9) - TUBULAR ADENOMA(S). - NEGATIVE FOR HIGH GRADE DYSPLASIA OR MALIGNANCY.   MRCP 05/24/21: IMPRESSION: 1. Evidence of chronic pancreatitis with severe pancreatic atrophy and a tortuous  dilated main pancreatic duct which is mildly increased in diameter since previous study. 2. Numerous cystic foci again seen throughout the pancreas as described which are mildly enlarged since previous study. 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 No suspicious enhancement visualized. Recommend follow-up MRI in 12 months. 3. Cholelithiasis. 4. Hepatic steatosis. 5. Right renal atrophy.     Past Medical History:  Diagnosis Date   Allergic rhinitis    Bladder stones    CKD (chronic kidney disease), stage III ([place])    Common iliac aneurysm (HCC) right side   followed by vascular-- dr Trula Slade (note in epic)   Environmental and seasonal allergies    External carotid artery stenosis     per last duplex 05-18-2011  right moderate stenosis and left moderate to severe   Hematuria    History of adenomatous polyp of colon    tubular adenoma's   History of bladder stone    History of DVT in adulthood    08/ 2009 right lower extremity;  2013  left lower extremity  (10-11-2018 per  pt no dvt since 2013)   History of kidney stones    History of pulmonary embolism    bilatera lung  08/ 2009  (10-11-2018  per pt no pe since 2009)   History of wheezing    allergies--- prn inhaler   Hypertension    Hypogonadism male    Internal carotid artery stenosis, bilateral     per last duplex 05-18-2011 in epic,   bilateral 40-59% stenosis  (10-11-2018  per pt denies stroke/tia S&S)   Lower urinary tract symptoms (LUTS)    Mixed hyperlipidemia    Nephrolithiasis    Testosterone deficiency    Type 2 diabetes mellitus ([place])    Vitamin D deficiency    Wears glasses      Past Surgical History:  Procedure Laterality Date   COLONOSCOPY  last one 10-25-2017   CYSTOSCOPY WITH LITHOLAPAXY N/A 07/18/2016   Procedure: CYSTOSCOPY WITH LITHOLAPAXY;  Surgeon: Kathie Rhodes, MD;  Location: [facility];  Service: Urology;  Laterality: N/A;   CYSTOSCOPY WITH LITHOLAPAXY N/A  10/15/2018   Procedure: CYSTOSCOPY WITH LITHOLAPAXY;  Surgeon: Kathie Rhodes, MD;  Location: [facility];  Service: Urology;  Laterality: N/A;   IVC FILTER INSERTION  11/21/2007   IVC FILTER REMOVAL  01/31/2008   ORCHIOPEXY Bilateral 1950's   undescended testis's   RIGHT CHEEK/MOUTH BIOPSY  09/16/2008  (office)   squamous hyperplasia w/ hyperkeratosis   RIGHT DEEP CERVICAL NODE BIOPSY  01/03/2001   right posterior neck (per path report-- lymphoid hyperplasia w/ florid follicular hyperplasia, no malignancy(   TONSILLECTOMY  age 71   Family History  Problem Relation Age of Onset   Alzheimer's disease Mother    Diabetes Mother    Prostate cancer Father    Breast cancer Sister    Colon cancer Neg Hx    Rectal cancer Neg Hx    Stomach cancer Neg Hx    Esophageal cancer Neg Hx    Social History   Tobacco Use   Smoking status: Former    Packs/day: 1.00    Years: 20.00    Total pack years: 20.00    Types: Cigarettes    Quit date: 04/16/1991    Years since quitting: 31.0   Smokeless tobacco: Never  Substance Use Topics   Alcohol use: Yes    Comment: ocassional    Drug use: Never   Current Outpatient Medications  Medication Sig Dispense Refill   methylcellulose (CITRUCEL) oral powder Take 1 packet by mouth daily.     aspirin 81 MG tablet Take 81 mg by mouth daily.     Cholecalciferol (VITAMIN D3) 5000 units CAPS Take 2 capsules (10,000 Units total) by mouth daily. (Patient taking differently: Take 2 capsules by mouth daily.)     Chromium-Cinnamon (CINNAMON PLUS CHROMIUM) [phone] MCG-MG CAPS Take 2 capsules by mouth daily.     famotidine (PEPCID) 40 MG tablet Take 40 mg by mouth as needed.     fenofibrate (TRICOR) 145 MG tablet TAKE 1 TABLET DAILY FOR TRIGLYCERIDES (BLOOD FATS) 90 tablet 3   finasteride (PROSCAR) 5 MG tablet Take  1 tablet  Daily  for Prostate 90 tablet 3   fluticasone (FLONASE) 50 MCG/ACT nasal spray Place 2 sprays into both nostrils daily. 16 g 2    glipiZIDE (GLUCOTROL) 10 MG tablet TAKE 1 TABLET BY MOUTH 3 TIMES A DAY WITH MEALS FOR DIABETES 270 tablet 1   losartan (COZAAR) 25 MG tablet TAKE 1 TABLET DAILY FOR BLOOD PRESSURE  GOAL <140/80 AND KIDNEY PROTECTION. 90 tablet 3   metFORMIN (GLUCOPHAGE-XR) 500 MG 24 hr tablet TAKE 2 TABLETS TWICE A DAY WITH MEALS FOR DIABETES 360 tablet 3   Multiple Vitamins-Minerals (MEGA MULTIVITAMIN FOR MEN) TABS Take 1 tablet by mouth daily.     ONETOUCH ULTRA test strip TEST BLOOD SUGAR ONCE DAILY FOR DIABETES 100 strip 5   ranitidine (ZANTAC) 300 MG tablet Take 1 tablet by mouth at bedtime as needed.     rosuvastatin (CRESTOR) 5 MG tablet TAKE 1 TABLET BY MOUTH EVERY DAY FOR CHOLESTEROL 90 tablet 2   Semaglutide, 2 MG/DOSE, (OZEMPIC, 2 MG/DOSE,) 8 MG/3ML SOPN Inject  2 mg  into skin  weekly  for Diabetes 90 mL 3   tamsulosin (FLOMAX) 0.4 MG CAPS capsule Take  1 tablet  at Bedtime  for Prostate 90 capsule 3   No current facility-administered medications for this visit.   Allergies  Allergen Reactions   Doxycycline     unknown   Niacin And Related Other (See Comments)    flushing   Sulfa Antibiotics Itching     Review of Systems: All systems reviewed and negative except where noted in HPI.   Lab Results  Component Value Date   WBC 6.7 01/17/2022   HGB 12.7 (L) 01/17/2022   HCT 37.3 (L) 01/17/2022   MCV 93.5 01/17/2022   PLT 152 01/17/2022    Lab Results  Component Value Date   CREATININE 1.33 (H) 01/17/2022   BUN 27 (H) 01/17/2022   NA 138 01/17/2022   K 5.1 01/17/2022   CL 110 01/17/2022   CO2 19 (L) 01/17/2022    Lab Results  Component Value Date   ALT 12 01/17/2022   AST 11 01/17/2022   ALKPHOS 51 03/20/2020   BILITOT 0.5 01/17/2022    Lab Results  Component Value Date   IRON 91 09/26/2019   FERRITIN 212 09/26/2019     Physical Exam: BP 114/76   Pulse 78   Ht '5\' 11"'$  (1.803 m)   Wt 193 lb (87.5 kg)   BMI 26.92 kg/m  Constitutional: Pleasant,well-developed,  male in no acute distress. HEENT: Normocephalic and atraumatic. Conjunctivae are normal. No scleral icterus. Neck supple.  Cardiovascular: Normal rate, regular rhythm.  Pulmonary/chest: Effort normal and breath sounds normal. No wheezing, rales or rhonchi. Abdominal: Soft, nondistended, nontender. There are no masses palpable.  Extremities: bilateral LE edema Lymphadenopathy: No cervical adenopathy noted. Neurological: Alert and oriented to person place and time. Skin: Skin is warm and dry. No rashes noted. Psychiatric: Normal mood and affect. Behavior is normal.   ASSESSMENT: 82 y.o. male here for assessment of the following  1. History of colon polyps   2. Pancreatic cyst    He had multiple small adenomas back in 2019, we discussed at his age if he wanted to pursue any further surveillance exams.  I discussed what colonoscopy is with him, risks of doing the procedure and that of anesthesia, and weighing these risks against risks of not doing the procedure.  We had a good discussion about this.  At his age he really wants to avoid further invasive procedures and would like to avoid further colonoscopy if possible.  I think that is reasonable, given his current age, to be turning 48 soon, and he had no high risk adenomas on his last exam.  Otherwise I reviewed his prior imaging studies with him, I have not seen him since he was diagnosed with this pancreatic cyst and  other changes there.  He does have changes concerning for chronic pancreatitis of unclear etiology.  He really does not have any symptoms concerning for exocrine insufficiency at this time.  I do think a follow-up surveillance MRCP is reasonable given changes on his last exam and dilated PD, I will order this for him.  We discussed if there are worsening or concerning changes over time on the MRCP we may consider an EUS and discussed with that is with him if needed.  Hopefully this remains stable and no invasive measures are  needed.  PLAN: - given his age, he declines further colonoscopy exams after good discussion as outlined. I think this is reasonable - recommend he take Citrucel or metamucil once daily - bowels likely altered by Ozempic use - surveillance MRCP ordered, further recommendations pending that result - monitor for development of symptoms related to his pancreas which we discussed, consider fecal elastase if bowel changes / abdominal pain,  weight loss, etc  Jolly Mango, MD [facility]  CC: Unk Pinto, MD

## 2022-04-19 ENCOUNTER — Telehealth: Payer: Self-pay

## 2022-04-19 NOTE — Telephone Encounter (Signed)
Called [facility]. They will call the patient to get him scheduled for MRCP in March

## 2022-04-19 NOTE — Telephone Encounter (Signed)
-----  Message from Roosvelt Maser sent at 04/18/2022 12:00 PM EST ----- Regarding: RE: MRCP for pancreatiic cyst You will need to call them the scheduling team does not schedule for [facility]  Thanks, Vivien Rota  ----- Message ----- From: Roetta Sessions, CMA Sent: 04/18/2022  11:45 AM EST To: April H Pait; Roosvelt Maser Subject: MRCP for pancreatiic cyst                      Please contact patient to schedule MRCP for pancreatic cyst at [facility] in [facility].  Thank you, [facility]

## 2022-05-02 ENCOUNTER — Other Ambulatory Visit: Payer: Self-pay | Admitting: Nurse Practitioner

## 2022-05-02 DIAGNOSIS — E1122 Type 2 diabetes mellitus with diabetic chronic kidney disease: Secondary | ICD-10-CM

## 2022-05-06 ENCOUNTER — Ambulatory Visit: Payer: Medicare Other | Admitting: Adult Health

## 2022-05-09 ENCOUNTER — Ambulatory Visit: Payer: Medicare Other | Admitting: Nurse Practitioner

## 2022-05-09 ENCOUNTER — Encounter: Payer: Self-pay | Admitting: Nurse Practitioner

## 2022-05-09 VITALS — BP 102/60 | HR 86 | Temp 97.7°F | Ht 71.0 in | Wt 188.2 lb

## 2022-05-09 DIAGNOSIS — K219 Gastro-esophageal reflux disease without esophagitis: Secondary | ICD-10-CM

## 2022-05-09 DIAGNOSIS — R6889 Other general symptoms and signs: Secondary | ICD-10-CM

## 2022-05-09 DIAGNOSIS — N183 Chronic kidney disease, stage 3 unspecified: Secondary | ICD-10-CM

## 2022-05-09 DIAGNOSIS — I700 Atherosclerosis of aorta: Secondary | ICD-10-CM

## 2022-05-09 DIAGNOSIS — R296 Repeated falls: Secondary | ICD-10-CM

## 2022-05-09 DIAGNOSIS — K760 Fatty (change of) liver, not elsewhere classified: Secondary | ICD-10-CM

## 2022-05-09 DIAGNOSIS — F419 Anxiety disorder, unspecified: Secondary | ICD-10-CM

## 2022-05-09 DIAGNOSIS — E663 Overweight: Secondary | ICD-10-CM

## 2022-05-09 DIAGNOSIS — R2681 Unsteadiness on feet: Secondary | ICD-10-CM

## 2022-05-09 DIAGNOSIS — K862 Cyst of pancreas: Secondary | ICD-10-CM

## 2022-05-09 DIAGNOSIS — E559 Vitamin D deficiency, unspecified: Secondary | ICD-10-CM

## 2022-05-09 DIAGNOSIS — I10 Essential (primary) hypertension: Secondary | ICD-10-CM

## 2022-05-09 DIAGNOSIS — I723 Aneurysm of iliac artery: Secondary | ICD-10-CM | POA: Diagnosis not present

## 2022-05-09 DIAGNOSIS — Z0000 Encounter for general adult medical examination without abnormal findings: Secondary | ICD-10-CM

## 2022-05-09 DIAGNOSIS — Z0001 Encounter for general adult medical examination with abnormal findings: Secondary | ICD-10-CM

## 2022-05-09 DIAGNOSIS — N1831 Chronic kidney disease, stage 3a: Secondary | ICD-10-CM

## 2022-05-09 DIAGNOSIS — E1122 Type 2 diabetes mellitus with diabetic chronic kidney disease: Secondary | ICD-10-CM | POA: Diagnosis not present

## 2022-05-09 DIAGNOSIS — N261 Atrophy of kidney (terminal): Secondary | ICD-10-CM

## 2022-05-09 DIAGNOSIS — E1169 Type 2 diabetes mellitus with other specified complication: Secondary | ICD-10-CM

## 2022-05-09 DIAGNOSIS — K8020 Calculus of gallbladder without cholecystitis without obstruction: Secondary | ICD-10-CM

## 2022-05-09 DIAGNOSIS — E1142 Type 2 diabetes mellitus with diabetic polyneuropathy: Secondary | ICD-10-CM | POA: Diagnosis not present

## 2022-05-09 DIAGNOSIS — D696 Thrombocytopenia, unspecified: Secondary | ICD-10-CM

## 2022-05-09 DIAGNOSIS — Z86010 Personal history of colonic polyps: Secondary | ICD-10-CM

## 2022-05-09 DIAGNOSIS — E785 Hyperlipidemia, unspecified: Secondary | ICD-10-CM

## 2022-05-09 DIAGNOSIS — Z79899 Other long term (current) drug therapy: Secondary | ICD-10-CM

## 2022-05-09 MED ORDER — BUSPIRONE HCL 5 MG PO TABS: ORAL_TABLET | ORAL | 0 refills | Status: AC

## 2022-05-09 NOTE — Patient Instructions (Signed)

## 2022-05-09 NOTE — Progress Notes (Signed)
MEDICARE ANNUAL WELLNESS VISIT AND FOLLOW UP Assessment:   Annual Medicare Wellness Visit Due annually  Health maintenance reviewed  Atherosclerosis of aorta (HCC)/hyperlipidemia Continue fenofibrate, rosuvastatin Control blood pressure, cholesterol, glucose, increase exercise.  Discussed lifestyle modifications. Recommended diet heavy in fruits and veggies, omega 3's. Decrease consumption of animal meats, cheeses, and dairy products. Remain active and exercise as tolerated. Continue to monitor.   Essential hypertension Continue losartan Discussed DASH (Dietary Approaches to Stop Hypertension) DASH diet is lower in sodium than a typical American diet. Cut back on foods that are high in saturated fat, cholesterol, and trans fats. Eat more whole-grain foods, fish, poultry, and nuts Remain active and exercise as tolerated daily.  Monitor BP at home-Call if greater than 130/80.  Check CMP/CBC  Aneurysm of left iliac artery (HCC) Control blood pressure, cholesterol, glucose, increase exercise.   CKD stage 3 due to type 2 diabetes mellitus (HCC) Continue ARB, bASA, statin Discussed how what you eat and drink can aide in kidney protection. Stay well hydrated. Avoid high salt foods. Avoid NSAIDS. Keep BP and BG well controlled.   Take medications as prescribed. Remain active and exercise as tolerated daily. Maintain weight.  Continue to monitor. Check CMP/GFR/Microablumin   Type 2 diabetes mellitus with stage 3b chronic kidney disease, without long-term current use of insulin (HCC) Continue cinnamon, glipizide, ozempic 2 mg Education: Reviewed 'ABCs' of diabetes management  Discussed goals to be met and/or maintained include A1C (<7) Blood pressure (<130/80) Cholesterol (LDL <70) Continue Eye Exam yearly  Continue Dental Exam Q6 mo Discussed dietary recommendations Discussed Physical Activity recommendations Foot exam UTD Check A1C  Diabetic polyneuropathy associated  with type 2 diabetes mellitus (HCC) Keep BG well controlled and A1c <7 Continue yearly foot exams Proper fitting shoes  Overweight - BMI 27 Discussed appropriate BMI Diet modification. Physical activity. Encouraged/praised to build confidence.  Thrombocytopenia (HCC) Monitor CBC  Vitamin D deficiency Continue supplement for goal of 60-100 Monitor levels  GERD No suspected reflux complications ([place]/stricture). Lifestyle modification:  wt loss, avoid meals 2-3h before bedtime. Consider eliminating food triggers:  chocolate, caffeine, EtOH, acid/spicy food.  Hepatic steatosis Weight loss advised, avoid alcohol/tylenol, will monitor LFTs  Gallstones Monitor; denies current sx  Pancreas cyst MRI Due Scheduled   History of colon polyps Overdue for colonoscopy - declines  Unsteady gait/multiple falls Discussed proper fitting shoes Refer to PT for gait stability and training.  Anxiety Start Buspar Reviewed relaxation techniques.  Sleep hygiene. Recommended Cognitive Behavioral Therapy (CBT) - declines at this time. Recommended mindfulness meditation and exercise.   Encouraged personality growth wand development through coping techniques and problem-solving skills.   Meds ordered this encounter  Medications   busPIRone (BUSPAR) 5 MG tablet    Sig: Take     1/2 to 1 tablet     3 x /day      for Anxiety    Dispense:  30 tablet    Refill:  0    Order Specific Question:   Supervising Provider    Answer:   Unk Pinto [phone]   Notify office for further evaluation and treatment, questions or concerns if any reported s/s fail to improve.   The patient was advised to call back or seek an in-person evaluation if any symptoms worsen or if the condition fails to improve as anticipated.   Further disposition pending results of labs. Discussed med's effects and SE's.    I discussed the assessment and treatment plan with the patient. The patient  was provided an  opportunity to ask questions and all were answered. The patient agreed with the plan and demonstrated an understanding of the instructions.  Discussed med's effects and SE's. Screening labs and tests as requested with regular follow-up as recommended.  I provided 35 minutes of face-to-face time during this encounter including counseling, chart review, and critical decision making was preformed.   Future Appointments  Date Time Provider Bee  08/09/2022  2:30 PM Unk Pinto, MD [facility] None  11/10/2022  2:30 PM Darrol Jump, NP [facility] None  02/13/2023  2:00 PM Unk Pinto, MD [facility] None  05/17/2023  2:00 PM Darrol Jump, NP [facility] None     Plan:   During the course of the visit the patient was educated and counseled about appropriate screening and preventive services including:   Pneumococcal vaccine  Influenza vaccine Prevnar 13 Td vaccine Screening electrocardiogram Colorectal cancer screening Diabetes screening Glaucoma screening Nutrition counseling    Subjective:  Joshua Peterson is a 82 y.o. male who presents for Medicare Annual Wellness Visit and 1 month follow up for poorly controlled T2DM.   He is no living at [facility] with wife.  Enjoys living there.  Feels as though the new flooring is causing him to trip and fall more.  Notes x2 falls in the past 3 months with a 3rd fall contributed to tripping over his dog.  He does not use a walker.  Notes increase in anxiety and irritability.  Reports now being the sole caretaker of his wife, 100% of the time.  Gets frustrated by her demands and increases his anxiety.  At times he gets so irritated he wants to punch things as he is not as "fast" as he once was.  States this causing increase in anxiety and panic.    Follows with Dr. Karsten Ro (going to retire per patient) for kidney stones. Denies any recent flares.  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 Havery Moros, however, patient has not had follow up.  He does note following with Dr. Luvenia Starch 04/18/22 who advised him he no longer need to complete a colonoscopy even though last one completed in 2019 asked for a 3 year recall.  He denies any current s/s.    BMI is Body mass index is 26.25 kg/m., he is working on diet, admits hasn't been walking much, typically does well when he does. Weight down since on ozempic.  Wt Readings from Last 3 Encounters:  05/09/22 188 lb 3.2 oz (85.4 kg)  04/18/22 193 lb (87.5 kg)  02/04/22 193 lb 9.6 oz (87.8 kg)   His blood pressure has been controlled at home, today their BP is BP: 102/60 He does not workout. He denies chest pain, shortness of breath, dizziness.   He has history of recurrent DVTs with travel, had IVC filter He has aortic atherosclerosis per Ct 2018.   He is on cholesterol medication and denies myalgias. His cholesterol is not at goal. The cholesterol last visit was:   Lab Results  Component Value Date   CHOL 88 01/17/2022   HDL 31 (L) 01/17/2022   LDLCALC 36 01/17/2022   TRIG 125 01/17/2022   CHOLHDL 2.8 01/17/2022   He admits hasn't been working  on diet and exercise for Diabetes  with diabetic chronic kidney disease he is on losartan 50 mg Hyperlipidemia on crestor 5 mg at goal, on tricor.  he is on metformin prescribed 4 tabs/day He was prescribed ozempic, on 2 mg weekly  Cinnamon  Glucotrol 10 mg 3 x a day right after eating He does check fasting, reports ranges 120-224, average 180s Denies hypoglycemia DEE 06/2019 no retinopathy, overdue and denies paresthesia of the feet, polydipsia, polyuria and visual disturbances.  Last A1C was:  Lab Results   Component Value Date   HGBA1C 8.3 (H) 01/17/2022   CKD III Last GFR Lab Results  Component Value Date   EGFR 54 (L) 01/17/2022    Patient is on Vitamin D supplement.   Lab Results  Component Value Date   VD25OH 31 01/17/2022   Medication Review:  Current Outpatient Medications (Endocrine & Metabolic):    glipiZIDE (GLUCOTROL) 10 MG tablet, TAKE 1 TABLET BY MOUTH 3 TIMES A DAY WITH MEALS FOR DIABETES   metFORMIN (GLUCOPHAGE-XR) 500 MG 24 hr tablet, TAKE 2 TABLETS TWICE A DAY WITH MEALS FOR DIABETES   Semaglutide, 2 MG/DOSE, (OZEMPIC, 2 MG/DOSE,) 8 MG/3ML SOPN, Inject  2 mg  into skin  weekly  for Diabetes  Current Outpatient Medications (Cardiovascular):    fenofibrate (TRICOR) 145 MG tablet, TAKE 1 TABLET DAILY FOR TRIGLYCERIDES (BLOOD FATS)   losartan (COZAAR) 25 MG tablet, TAKE 1 TABLET DAILY FOR BLOOD PRESSURE GOAL <140/80 AND KIDNEY PROTECTION.   rosuvastatin (CRESTOR) 5 MG tablet, TAKE 1 TABLET BY MOUTH EVERY DAY FOR CHOLESTEROL  Current Outpatient Medications (Respiratory):    fluticasone (FLONASE) 50 MCG/ACT nasal spray, Place 2 sprays into both nostrils daily.  Current Outpatient Medications (Analgesics):    aspirin 81 MG tablet, Take 81 mg by mouth daily.   Current Outpatient Medications (Other):    busPIRone (BUSPAR) 5 MG tablet, Take     1/2 to 1 tablet     3 x /day      for Anxiety   Cholecalciferol (VITAMIN D3) 5000 units CAPS, Take 2 capsules (10,000 Units total) by mouth daily. (Patient taking differently: Take 2 capsules by mouth daily.)   Chromium-Cinnamon (CINNAMON PLUS CHROMIUM) [phone] MCG-MG CAPS, Take 2 capsules by mouth daily.   finasteride (PROSCAR) 5 MG tablet, Take  1 tablet  Daily  for Prostate   methylcellulose (CITRUCEL) oral powder, Take 1 packet by mouth daily.   Multiple Vitamins-Minerals (MEGA MULTIVITAMIN FOR MEN) TABS, Take 1 tablet by mouth daily.   ONETOUCH ULTRA test strip, TEST BLOOD SUGAR ONCE DAILY FOR DIABETES   tamsulosin (FLOMAX)  0.4 MG CAPS capsule, Take  1 tablet  at Bedtime  for Prostate   famotidine (PEPCID) 40 MG tablet, Take 40 mg by mouth as needed. (Patient not taking: Reported on 05/09/2022)   ranitidine (ZANTAC) 300 MG tablet, Take 1 tablet by mouth at bedtime as needed. (Patient not taking: Reported on 05/09/2022)  Allergies: Allergies  Allergen Reactions   Doxycycline     unknown   Niacin And Related Other (See Comments)    flushing   Sulfa Antibiotics Itching    Current Problems (verified) has History of DVT of lower extremity; CKD stage 3 due to type 2 diabetes mellitus ([place]); Labile hypertension; Hyperlipidemia associated with type 2 diabetes mellitus ([place]); Vitamin D deficiency; BMI 27.0-27.9,adult; Medication management; Gastroesophageal reflux disease; Thrombocytopenia ([place]); Aneurysm of left iliac artery (HCC); Gallstones; Depression; Type 2 diabetes mellitus with stage 3 chronic kidney disease, without  long-term current use of insulin ([place]); BPH with obstruction/lower urinary tract symptoms; Diabetic polyneuropathy associated with type 2 diabetes mellitus ([place]); Aortic atherosclerosis ([place]) by CT scan 2018; Pancreas cyst; Hepatic steatosis; Atrophy of right kidney; and History of colon polyps on their problem list.  Screening Tests Immunization History  Administered Date(s) Administered   Covid-19, Mrna,Vaccine(Spikevax)35yr and older 01/20/2022   Fluad Quad(high Dose 65+) 11/23/2018, 01/01/2021   Hep A, Unspecified 06/04/1996, 07/06/1998   Hep B, Unspecified 02/02/1995   Influenza, High Dose Seasonal PF 01/14/2015, 11/26/2015, 12/29/2016, 12/18/2017, 01/20/2022   Influenza-Unspecified 12/29/2018, 01/20/2020   Moderna Covid-19 Vaccine Bivalent Booster 157yr& up 01/01/2021   Moderna Sars-Covid-2 Vaccination 04/04/2019, 05/02/2019, 01/31/2020, 07/28/2020   PNEUMOCOCCAL CONJUGATE-20 03/31/2021   Pneumococcal Conjugate-13 01/14/2015   Pneumococcal Polysaccharide-23 12/29/2018    Pneumococcal-Unspecified 04/02/2008   Polio, Unspecified 09/14/1990   Td 04/02/2006, 12/29/2016   Td (Adult), 2 Lf Tetanus Toxid, Preservative Free 09/14/1990, 07/07/1996   Tdap 03/31/2021   Typhoid Inactivated 09/28/2007   Yellow Fever 08/10/1990, 07/26/2000   Zoster Recombinat (Shingrix) 08/02/2021   Zoster, Live 04/03/2007   Preventative care: Last colonoscopy: 10/2017 Dr. ArHavery Morospolyps due for 3 year, overdue - recently cleared for return d/t age  TDAP 2018 CXR 2017  Names of Other Physician/Practitioners you currently use: 1. [facility] here for primary care 2. Dr. GoDelman [facility] doctor, last visit 06/2021 3. Dr. NoMariea Clontsdentist, last visit 2023  Patient Care Team: McUnk PintoMD as PCP - General (Internal Medicine) OtKathie RhodesMD (Inactive) as Consulting Physician (Urology)  Surgical: He  has a past surgical history that includes RIGHT DEEP CERVICAL NODE BIOPSY (01/03/2001); RIGHT CHEEK/MOUTH BIOPSY (09/16/2008  (office)); Colonoscopy (last one 10-25-2017); IVC FILTER INSERTION (11/21/2007); IVC FILTER REMOVAL (01/31/2008); Tonsillectomy (age 82 Orchiopexy (Bilateral, 1950's); Cystoscopy with litholapaxy (N/A, 07/18/2016); and Cystoscopy with litholapaxy (N/A, 10/15/2018). Family His family history includes Alzheimer's disease in his mother; Breast cancer in his sister; Diabetes in his mother; Prostate cancer in his father. Social history  He reports that he quit smoking about 31 years ago. His smoking use included cigarettes. He has a 20.00 pack-year smoking history. He has never used smokeless tobacco. He reports current alcohol use. He reports that he does not use drugs.  MEDICARE WELLNESS OBJECTIVES: Physical activity: Exercise limited by: neurologic condition(s) Cardiac risk factors: Cardiac Risk Factors include: advanced age (>5572m >65>32men);diabetes mellitus;hypertension;male gender Depression/mood screen:       05/09/2022    4:42 PM  Depression screen PHQ 2/9  Decreased Interest 0  Down, Depressed, Hopeless 0  PHQ - 2 Score 0    ADLs:     05/09/2022    4:42 PM 01/16/2022    9:33 PM  In your present state of health, do you have any difficulty performing the following activities:  Hearing? 0 0  Vision? 0 0  Difficulty concentrating or making decisions? 0 0  Walking or climbing stairs? 1 0  Comment Referral placed to PT   Dressing or bathing? 0 0  Doing errands, shopping? 0 0  Preparing Food and eating ? N   Using the Toilet? N   In the past six months, have you accidently leaked urine? N   Do you have problems with loss of bowel control? N   Managing your Medications? N   Managing your Finances? N   Housekeeping or managing your Housekeeping? N      Cognitive Testing  Alert? Yes  Normal Appearance?Yes  Oriented to  person? Yes  Place? Yes   Time? Yes  Recall of three objects?  Yes  Can perform simple calculations? Yes  Displays appropriate judgment?Yes  Can read the correct time from a watch face?Yes  EOL planning: Does Patient Have a Medical Advance Directive?: Yes Type of Advance Directive: Living will   Objective:   Today's Vitals   05/09/22 1425  BP: 102/60  Pulse: 86  Temp: 97.7 F (36.5 C)  SpO2: 99%  Weight: 188 lb 3.2 oz (85.4 kg)  Height: 5' 11"$  (1.803 m)   Body mass index is 26.25 kg/m.  Wt Readings from Last 3 Encounters:  05/09/22 188 lb 3.2 oz (85.4 kg)  04/18/22 193 lb (87.5 kg)  02/04/22 193 lb 9.6 oz (87.8 kg)     General appearance: alert, no distress, WD/WN, male obese HEENT: normocephalic, sclerae anicteric, TMs pearly, nares patent, no discharge or erythema, pharynx normal.  Left eye with external hordeoleum.  No active drainage or discharge.  No conjunctival injections Oral cavity: MMM, no lesions Neck: supple, no lymphadenopathy, no thyromegaly, no masses Heart: RRR, normal S1, S2, no murmurs Lungs: CTA bilaterally, no wheezes, rhonchi, or  rales Abdomen: +bs, soft, obese, non tender, non distended, no masses, no hepatomegaly, no splenomegaly Musculoskeletal: nontender, no swelling, no obvious deformity Extremities: no edema, no cyanosis, no clubbing Pulses: 2+ symmetric, upper and lower extremities, normal cap refill Neurological: alert, oriented x 3, CN2-12 intact, strength normal upper extremities and lower extremities, sensation decreased bilateral feet, DTRs 2+ throughout, no cerebellar signs, gait normal Psychiatric: normal affect, behavior normal, pleasant   Medicare Attestation I have personally reviewed: The patient's medical and social history Their use of alcohol, tobacco or illicit drugs Their current medications and supplements The patient's functional ability including ADLs,fall risks, home safety risks, cognitive, and hearing and visual impairment Diet and physical activities Evidence for depression or mood disorders  The patient's weight, height, BMI, and visual acuity have been recorded in the chart.  I have made referrals, counseling, and provided education to the patient based on review of the above and I have provided the patient with a written personalized care plan for preventive services.     Darrol Jump, NP   05/09/2022

## 2022-05-10 LAB — COMPLETE METABOLIC PANEL WITH GFR
AG Ratio: 2 (calc) (ref 1.0–2.5)
ALT: 10 U/L (ref 9–46)
AST: 11 U/L (ref 10–35)
Albumin: 4.3 g/dL (ref 3.6–5.1)
Alkaline phosphatase (APISO): 59 U/L (ref 35–144)
BUN/Creatinine Ratio: 19 (calc) (ref 6–22)
BUN: 31 mg/dL — ABNORMAL HIGH (ref 7–25)
CO2: 22 mmol/L (ref 20–32)
Calcium: 9.5 mg/dL (ref 8.6–10.3)
Chloride: 113 mmol/L — ABNORMAL HIGH (ref 98–110)
Creat: 1.65 mg/dL — ABNORMAL HIGH (ref 0.70–1.22)
Globulin: 2.2 g/dL (calc) (ref 1.9–3.7)
Glucose, Bld: 162 mg/dL — ABNORMAL HIGH (ref 65–99)
Potassium: 4.5 mmol/L (ref 3.5–5.3)
Sodium: 142 mmol/L (ref 135–146)
Total Bilirubin: 0.4 mg/dL (ref 0.2–1.2)
Total Protein: 6.5 g/dL (ref 6.1–8.1)
eGFR: 41 mL/min/{1.73_m2} — ABNORMAL LOW (ref >=60)

## 2022-05-10 LAB — CBC WITH DIFFERENTIAL/PLATELET
Absolute Monocytes: 319 cells/uL (ref 200–950)
Basophils Absolute: 61 cells/uL (ref 0–200)
Basophils Relative: 0.8 %
Eosinophils Absolute: 122 cells/uL (ref 15–500)
Eosinophils Relative: 1.6 %
HCT: 35.6 % — ABNORMAL LOW (ref 38.5–50.0)
Hemoglobin: 12.3 g/dL — ABNORMAL LOW (ref 13.2–17.1)
Lymphs Abs: 1930 cells/uL (ref 850–3900)
MCH: 32 pg (ref 27.0–33.0)
MCHC: 34.6 g/dL (ref 32.0–36.0)
MCV: 92.7 fL (ref 80.0–100.0)
MPV: 9.9 fL (ref 7.5–12.5)
Monocytes Relative: 4.2 %
Neutro Abs: 5168 cells/uL (ref 1500–7800)
Neutrophils Relative %: 68 %
Platelets: 164 10*3/uL (ref 140–400)
RBC: 3.84 10*6/uL — ABNORMAL LOW (ref 4.20–5.80)
RDW: 12.5 % (ref 11.0–15.0)
Total Lymphocyte: 25.4 %
WBC: 7.6 10*3/uL (ref 3.8–10.8)

## 2022-05-10 LAB — HEMOGLOBIN A1C
Hgb A1c MFr Bld: 6.8 % of total Hgb — ABNORMAL HIGH (ref <5.7)
Mean Plasma Glucose: 148 mg/dL
eAG (mmol/L): 8.2 mmol/L

## 2022-05-10 LAB — LIPID PANEL
Cholesterol: 85 mg/dL (ref <200)
HDL: 29 mg/dL — ABNORMAL LOW (ref >=40)
LDL Cholesterol (Calc): 31 mg/dL (calc)
Non-HDL Cholesterol (Calc): 56 mg/dL (calc) (ref <130)
Total CHOL/HDL Ratio: 2.9 (calc) (ref <5.0)
Triglycerides: 171 mg/dL — ABNORMAL HIGH (ref <150)

## 2022-05-10 LAB — VITAMIN D 25 HYDROXY (VIT D DEFICIENCY, FRACTURES): Vit D, 25-Hydroxy: 39 ng/mL (ref 30–100)

## 2022-05-23 ENCOUNTER — Telehealth: Payer: Self-pay | Admitting: Nurse Practitioner

## 2022-05-23 NOTE — Telephone Encounter (Signed)
Made in error

## 2022-06-13 ENCOUNTER — Telehealth: Payer: Self-pay | Admitting: Nurse Practitioner

## 2022-06-13 NOTE — Telephone Encounter (Signed)
Pt has a stye again on his eye, looks like he was treated with cephalexin back in November for it. Wanting to know if it can be filled again  or if he needs to come in and be seen

## 2022-07-07 ENCOUNTER — Ambulatory Visit
Admission: RE | Admit: 2022-07-07 | Discharge: 2022-07-07 | Disposition: A | Discharge status: Home / Self Care | Payer: Medicare Other | Source: Ambulatory Visit | Attending: Nurse Practitioner | Admitting: Nurse Practitioner

## 2022-07-07 DIAGNOSIS — K862 Cyst of pancreas: Secondary | ICD-10-CM

## 2022-07-07 MED ORDER — GADOPICLENOL 0.5 MMOL/ML IV SOLN
7.5000 mL | Freq: Once | INTRAVENOUS | Status: AC | PRN
  Administered 2022-07-07: 8 mL via INTRAVENOUS

## 2022-07-12 ENCOUNTER — Telehealth: Payer: Self-pay | Admitting: Gastroenterology

## 2022-07-12 DIAGNOSIS — K862 Cyst of pancreas: Secondary | ICD-10-CM

## 2022-07-12 NOTE — Telephone Encounter (Signed)
I called the patient to review results of MRCP with him.  Left message no answer. I also discussed his case with Dr. Meridee Score.  Can consider EUS for this however question is what to do with this information, if he is a candidate for Whipple/pancreatectomy pending findings.  At his age he may not be, and if that is the case one could question how aggressive to be and should would be doing anything at this point.  Options are to either do EUS to further evaluate versus check CA 19-9 and do a follow-up MRC, or nothing.  I will discuss options with him.

## 2022-07-12 NOTE — Telephone Encounter (Signed)
Patient is returning your call to go over results.

## 2022-07-12 NOTE — Telephone Encounter (Signed)
Called patient - explained results of his MRCP and options at this point. Offered him an EUS vs. Follow up MRCP in 6 months along with CA 19-9, vs. Stopping further surveillance. We discussed each of these options at length. He wishes to see his PCP to discuss further and think about it to determine how aggressive he wishes to be. I will coordinate an office visit with me in a few months to discuss further with him. If he makes a decision about the EUS  in the interim he will contact me.   Jan can you help book him a follow up with me in 1-2 months or so? Thanks

## 2022-07-13 NOTE — Addendum Note (Signed)
Addended by: Cooper Render on: 07/13/2022 03:38 PM   Modules accepted: Orders

## 2022-07-13 NOTE — Telephone Encounter (Signed)
Called patient and Left detailed message asking him to go to lab in the next week or so. Also scheduled him for Dr. Lanetta Inch next available appointment, 7-2 at 3:40 pm. Asked him to call back if he needs to reschedule this appointment.  Letter mailed to patient with appointment information

## 2022-07-16 ENCOUNTER — Other Ambulatory Visit: Payer: Medicare Other

## 2022-08-03 ENCOUNTER — Other Ambulatory Visit: Payer: Self-pay | Admitting: Nurse Practitioner

## 2022-08-04 ENCOUNTER — Other Ambulatory Visit: Payer: Self-pay

## 2022-08-04 MED ORDER — FLUTICASONE PROPIONATE 50 MCG/ACT NA SUSP: 2.0000 | Freq: Every day | NASAL | 2 refills | Status: DC

## 2022-08-08 ENCOUNTER — Encounter: Payer: Self-pay | Admitting: Internal Medicine

## 2022-08-08 NOTE — Patient Instructions (Signed)

## 2022-08-08 NOTE — Progress Notes (Signed)
Future Appointments  Date Time Provider Department  08/09/2022                            6 mo ov  2:30 PM Lucky Cowboy, MD [facility]  09/20/2022  3:40 PM Armbruster, Willaim Rayas, MD [facility]  11/10/2022                            wellness  2:30 PM Adela Glimpse, NP [facility]  02/13/2023                           cpe  2:00 PM Lucky Cowboy, MD [facility]  05/17/2023                            wellness  2:00 PM Adela Glimpse, NP [facility]    History of Present Illness:                                                         This very nice 82 y.o. MWM presents for 6  month follow up with HTN, HLD, Pre-Diabetes and Vitamin D Deficiency. Abd CT scan in 2018 showed Aortic Atherosclerosis. Patient has remote hx/o Gout & is off meds.                                                          Patient is treated for HTN & BP has been controlled at home. Today's BP is  at goal - 138/68.   Patient has had no complaints of any cardiac type chest pain, palpitations, dyspnea / orthopnea / PND, dizziness, claudication, or dependent edema.                                                         Hyperlipidemia is controlled with diet & meds. Patient denies myalgias or other med SE's. 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  Last A1c was not at goal:   Lab Results  Component Value Date   HGBA1C 6.8 (H) 05/09/2022   Wt Readings from Last 3 Encounters:  08/09/22 189 lb 3.2 oz (85.8 kg)  05/09/22 188 lb 3.2 oz (85.4 kg)  04/18/22 193 lb (87.5 kg)  Further,  the patient also has history of Vitamin D Deficiency and supplements vitamin D without any suspected side-effects. Last vitamin D was not at goal:   Lab Results  Component Value Date   VD25OH 39 05/09/2022        Outpatient Medications Prior to Visit  Medication Sig   aspirin 81 MG tablet Take  daily.   VITAMIN D 5,000 u Take 2 capsules daily   CINNAMON PLUS CHROMIUM [phone]  Take 2 capsules daily.   Famotidine 0 MG tablet Take  as needed.   fenofibrate  145 MG tablet TAKE 1 TABLET DAILY    FLONASE nasal spray Place 2 sprays into  nostrils daily.   glipiZIDE 10 MG tablet TAKE 1 TABLET 3 TIMES A DAY    losartan 25 MG tablet TAKE 1 TABLET DAILY    metFORMIN-XR 500 MG  TAKE 2 TABLETS TWICE A DAY WITH MEALS \\   Multiple Vitamins-Minerals Take 1 tablet daily.   Potassium Citrate 15 MEQ  Take 1 tablet 2 x /day to Prevent Kidney Stones   rosuvastatin 5 MG tablet TAKE 1 TABLET  EVERY DAY    Semaglutide, 2 MG/DOSE , 8 MG/3ML SOPN Inject  2 mg  into skin  weekly  for Diabetes          Allergies  Allergen Reactions   Doxycycline unknown   Niacin And Related flushing   Sulfa Antibiotics Itching      PMHx:       Past Medical History:  Diagnosis Date   Allergic rhinitis     Bladder stones     CKD (chronic kidney disease), stage III (HCC)     Common iliac aneurysm (HCC) right side    followed by vascular-- dr Myra Gianotti (note in epic)   Environmental and seasonal allergies     External carotid artery stenosis       per last duplex 05-18-2011  right moderate stenosis and left moderate to severe   Hematuria     History of adenomatous polyp of colon      tubular adenoma's   History of bladder stone     History of DVT in adulthood      08/ 2009 right lower extremity;  2013  left lower extremity  (10-11-2018 per pt no dvt since 2013)   History of kidney stones     History of pulmonary embolism      bilatera lung  08/ 2009  (10-11-2018  per pt no pe since 2009)   History of wheezing       allergies--- prn inhaler   Hypertension     Hypogonadism male     Internal carotid artery stenosis, bilateral       per last duplex 05-18-2011 in epic,   bilateral 40-59% stenosis  (10-11-2018  per pt denies stroke/tia S&S)   Lower urinary tract symptoms (LUTS)     Mixed hyperlipidemia     Nephrolithiasis     Testosterone deficiency     Type 2 diabetes mellitus (HCC)     Vitamin D deficiency     Wears glasses            Immunization History  Administered Date(s) Administered   Fluad Quad(high Dose 65) 11/23/2018   Influenza, High Dose  01/14/2015, 11/26/2015, 12/29/2016, 12/18/2017   Moderna Sars-Covid-2 Vacc 04/04/2019, 05/02/2019   Pneumococcal -13 01/14/2015   Pneumococcal-23 04/02/2008   Td 04/02/2006, 12/29/2016   Zoster 04/03/2007  Past Surgical History:  Procedure Laterality Date   COLONOSCOPY   last one 10-25-2017   CYSTOSCOPY WITH LITHOLAPAXY N/A 07/18/2016    Procedure: CYSTOSCOPY WITH LITHOLAPAXY;  Surgeon: Ihor Gully, MD;  Location: [facility];  Service: Urology;  Laterality: N/A;   CYSTOSCOPY WITH LITHOLAPAXY N/A 10/15/2018    Procedure: CYSTOSCOPY WITH LITHOLAPAXY;  Surgeon: Ihor Gully, MD;  Location: [facility];  Service: Urology;  Laterality: N/A;   IVC FILTER INSERTION   11/21/2007   IVC FILTER REMOVAL   01/31/2008   ORCHIOPEXY Bilateral 1950's    undescended testis's   RIGHT CHEEK/MOUTH BIOPSY   09/16/2008  (office)    squamous hyperplasia w/ hyperkeratosis   RIGHT DEEP CERVICAL NODE BIOPSY   01/03/2001    right posterior neck (per path report-- lymphoid hyperplasia w/ florid follicular hyperplasia, no malignancy(   TONSILLECTOMY   age 23      FHx:    Reviewed / unchanged   SHx:    Reviewed / unchanged    Systems Review:   Constitutional: Denies fever, chills, wt changes, headaches, insomnia, fatigue, night sweats, change in appetite. Eyes: Denies redness, blurred vision, diplopia, discharge, itchy,  watery eyes.  ENT: Denies discharge, congestion, post nasal drip, epistaxis, sore throat, earache, hearing loss, dental pain, tinnitus, vertigo, sinus pain, snoring.  CV: Denies chest pain, palpitations, irregular heartbeat, syncope, dyspnea, diaphoresis, orthopnea, PND, claudication or edema. Respiratory: denies cough, dyspnea, DOE, pleurisy, hoarseness, laryngitis, wheezing.  Gastrointestinal: Denies dysphagia, odynophagia, heartburn, reflux, water brash, abdominal pain or cramps, nausea, vomiting, bloating, diarrhea, constipation, hematemesis, melena, hematochezia  or hemorrhoids. Genitourinary: Denies dysuria, frequency, urgency, nocturia, hesitancy, discharge, hematuria or flank pain. Musculoskeletal: Denies arthralgias, myalgias, stiffness, jt. swelling, pain, limping or strain/sprain.  Skin: Denies pruritus, rash, hives, warts, acne, eczema or change in skin lesion(s). Neuro: No weakness, tremor, incoordination, spasms, paresthesia or pain. Psychiatric: Denies confusion, memory loss or sensory loss. Endo: Denies change in weight, skin or hair change.  Heme/Lymph: No excessive bleeding, bruising or enlarged lymph nodes.   Physical Exam  BP 138/68   Pulse 87   Temp 97.9 F (36.6 C)   Resp 17   Ht 5\' 11"  (1.803 m)   Wt 189 lb 3.2 oz (85.8 kg)   SpO2 98%   BMI 26.39 kg/m    Appears  well nourished, well groomed  and in no distress.   Eyes: PERRLA, EOMs, conjunctiva no swelling or erythema. Sinuses: No frontal/maxillary tenderness ENT/Mouth: EAC's clear, TM's nl w/o erythema, bulging. Nares clear w/o erythema, swelling, exudates. Oropharynx clear without erythema or exudates. Oral hygiene is good. Tongue normal, non obstructing. Hearing intact.  Neck: Supple. Thyroid not palpable. Car 2+/2+ without bruits, nodes or JVD. Chest: Respirations nl with BS clear & equal w/o rales, rhonchi, wheezing or stridor.  Cor: Heart sounds normal w/ regular rate and rhythm without sig. murmurs,  gallops, clicks or rubs. Peripheral pulses normal and equal  without edema.  Abdomen: Soft & bowel sounds normal. Non-tender w/o guarding, rebound, hernias, masses or organomegaly.  Lymphatics: Unremarkable.  Musculoskeletal: Full ROM all peripheral extremities, joint stability, 5/5 strength and normal gait.  Skin: Warm, dry without exposed rashes, lesions or ecchymosis apparent.  Neuro: Cranial nerves intact, reflexes equal bilaterally. Sensory-motor testing grossly intact. Tendon reflexes grossly intact.  Pysch: Alert & oriented x 3.  Insight and judgement nl & appropriate. No ideations.   Assessment and Plan:    1. Essential hypertension  - Continue medication, monitor blood  pressure at home.  - Continue DASH diet.  Reminder to go to the ER if any CP,  SOB, nausea, dizziness, severe HA, changes vision/speech.  - CBC with Differential/Platelet - COMPLETE METABOLIC PANEL WITH GFR  - Continue medication, monitor blood pressure at home.  - Continue DASH diet.  Reminder to go to the ER if any CP,  SOB, nausea, dizziness, severe HA, changes vision/speech.Magnesium - TSH  2. Hyperlipidemia associated with type 2 diabetes mellitus (HCC)  - Continue diet/meds, exercise,& lifestyle modifications.  - Continue monitor periodic cholesterol/liver & renal functions   - Lipid panel - TSH  3. 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  - Monitor appropriate labs  - Hemoglobin A1c - Insulin, random  4. Vitamin D deficiency  - Continue supplementation.  - VITAMIN D 25 Hydroxy  5. Idiopathic gout  - Uric acid  6. Aortic atherosclerosis (HCC) by CT scan 2018  - Lipid panel  7. Medication management  - CBC with Differential/Platelet - COMPLETE METABOLIC PANEL WITH GFR - Magnesium - Lipid panel - TSH - Hemoglobin A1c - Insulin, random - VITAMIN D 25  Hydroxy - Uric acid                                                                      Discussed  regular exercise, BP monitoring, weight control to achieve/maintain BMI less than 25 and discussed med and SE's. Recommended labs to assess and monitor clinical status with further disposition pending results of labs.  I discussed the assessment and treatment plan with the patient. The patient was provided an opportunity to ask questions and all were answered. The patient agreed with the plan and demonstrated an understanding of the instructions.  I provided over 30 minutes of exam, counseling, chart review and  complex critical decision making.                                                                    The patient was advised to call back or seek an in-person evaluation if the symptoms worsen or if the condition fails to improve as anticipated.     Marinus Maw, MD

## 2022-08-09 ENCOUNTER — Ambulatory Visit: Payer: Medicare Other | Admitting: Internal Medicine

## 2022-08-09 ENCOUNTER — Encounter: Payer: Self-pay | Admitting: Internal Medicine

## 2022-08-09 VITALS — BP 138/68 | HR 87 | Temp 97.9°F | Resp 17 | Ht 71.0 in | Wt 189.2 lb

## 2022-08-09 DIAGNOSIS — E1122 Type 2 diabetes mellitus with diabetic chronic kidney disease: Secondary | ICD-10-CM | POA: Diagnosis not present

## 2022-08-09 DIAGNOSIS — N1831 Chronic kidney disease, stage 3a: Secondary | ICD-10-CM

## 2022-08-09 DIAGNOSIS — E1169 Type 2 diabetes mellitus with other specified complication: Secondary | ICD-10-CM

## 2022-08-09 DIAGNOSIS — M1000 Idiopathic gout, unspecified site: Secondary | ICD-10-CM

## 2022-08-09 DIAGNOSIS — E785 Hyperlipidemia, unspecified: Secondary | ICD-10-CM

## 2022-08-09 DIAGNOSIS — I10 Essential (primary) hypertension: Secondary | ICD-10-CM | POA: Diagnosis not present

## 2022-08-09 DIAGNOSIS — Z79899 Other long term (current) drug therapy: Secondary | ICD-10-CM

## 2022-08-09 DIAGNOSIS — E559 Vitamin D deficiency, unspecified: Secondary | ICD-10-CM | POA: Diagnosis not present

## 2022-08-09 LAB — CBC WITH DIFFERENTIAL/PLATELET
Basophils Absolute: 63 cells/uL (ref 0–200)
Eosinophils Relative: 2 %
Hemoglobin: 12.3 g/dL — ABNORMAL LOW (ref 13.2–17.1)
RDW: 12.7 % (ref 11.0–15.0)

## 2022-08-10 ENCOUNTER — Other Ambulatory Visit: Payer: Self-pay | Admitting: Internal Medicine

## 2022-08-10 DIAGNOSIS — Z79899 Other long term (current) drug therapy: Secondary | ICD-10-CM

## 2022-08-10 DIAGNOSIS — N1832 Chronic kidney disease, stage 3b: Secondary | ICD-10-CM

## 2022-08-10 DIAGNOSIS — N179 Acute kidney failure, unspecified: Secondary | ICD-10-CM

## 2022-08-10 LAB — COMPLETE METABOLIC PANEL WITH GFR
AG Ratio: 1.9 (calc) (ref 1.0–2.5)
ALT: 14 U/L (ref 9–46)
AST: 13 U/L (ref 10–35)
Albumin: 4.4 g/dL (ref 3.6–5.1)
Alkaline phosphatase (APISO): 51 U/L (ref 35–144)
BUN/Creatinine Ratio: 19 (calc) (ref 6–22)
BUN: 37 mg/dL — ABNORMAL HIGH (ref 7–25)
CO2: 22 mmol/L (ref 20–32)
Calcium: 9.5 mg/dL (ref 8.6–10.3)
Chloride: 110 mmol/L (ref 98–110)
Creat: 1.93 mg/dL — ABNORMAL HIGH (ref 0.70–1.22)
Globulin: 2.3 g/dL (calc) (ref 1.9–3.7)
Glucose, Bld: 127 mg/dL — ABNORMAL HIGH (ref 65–99)
Potassium: 4.6 mmol/L (ref 3.5–5.3)
Sodium: 142 mmol/L (ref 135–146)
Total Bilirubin: 0.7 mg/dL (ref 0.2–1.2)
Total Protein: 6.7 g/dL (ref 6.1–8.1)
eGFR: 34 mL/min/{1.73_m2} — ABNORMAL LOW (ref >=60)

## 2022-08-10 LAB — MAGNESIUM: Magnesium: 1.7 mg/dL (ref 1.5–2.5)

## 2022-08-10 LAB — CBC WITH DIFFERENTIAL/PLATELET
Absolute Monocytes: 411 cells/uL (ref 200–950)
Basophils Relative: 0.8 %
Eosinophils Absolute: 158 cells/uL (ref 15–500)
HCT: 36.5 % — ABNORMAL LOW (ref 38.5–50.0)
Lymphs Abs: 2220 cells/uL (ref 850–3900)
MCH: 31.8 pg (ref 27.0–33.0)
MCHC: 33.7 g/dL (ref 32.0–36.0)
MCV: 94.3 fL (ref 80.0–100.0)
MPV: 9.5 fL (ref 7.5–12.5)
Monocytes Relative: 5.2 %
Neutro Abs: 5048 cells/uL (ref 1500–7800)
Neutrophils Relative %: 63.9 %
Platelets: 152 10*3/uL (ref 140–400)
RBC: 3.87 10*6/uL — ABNORMAL LOW (ref 4.20–5.80)
Total Lymphocyte: 28.1 %
WBC: 7.9 10*3/uL (ref 3.8–10.8)

## 2022-08-10 LAB — HEMOGLOBIN A1C
Hgb A1c MFr Bld: 7.2 % of total Hgb — ABNORMAL HIGH (ref <5.7)
Mean Plasma Glucose: 160 mg/dL
eAG (mmol/L): 8.9 mmol/L

## 2022-08-10 LAB — INSULIN, RANDOM: Insulin: 21.1 u[IU]/mL — ABNORMAL HIGH

## 2022-08-10 LAB — VITAMIN D 25 HYDROXY (VIT D DEFICIENCY, FRACTURES): Vit D, 25-Hydroxy: 41 ng/mL (ref 30–100)

## 2022-08-10 LAB — TSH: TSH: 1.59 mIU/L (ref 0.40–4.50)

## 2022-08-10 LAB — LIPID PANEL
Cholesterol: 84 mg/dL (ref <200)
HDL: 27 mg/dL — ABNORMAL LOW (ref >=40)
LDL Cholesterol (Calc): 37 mg/dL (calc)
Non-HDL Cholesterol (Calc): 57 mg/dL (calc) (ref <130)
Total CHOL/HDL Ratio: 3.1 (calc) (ref <5.0)
Triglycerides: 112 mg/dL (ref <150)

## 2022-08-10 LAB — URIC ACID: Uric Acid, Serum: 4.8 mg/dL (ref 4.0–8.0)

## 2022-08-24 ENCOUNTER — Ambulatory Visit: Payer: Medicare Other | Admitting: Internal Medicine

## 2022-08-26 ENCOUNTER — Ambulatory Visit: Payer: Medicare Other

## 2022-08-29 ENCOUNTER — Ambulatory Visit (INDEPENDENT_AMBULATORY_CARE_PROVIDER_SITE_OTHER): Payer: Medicare Other

## 2022-08-29 DIAGNOSIS — N1832 Chronic kidney disease, stage 3b: Secondary | ICD-10-CM

## 2022-08-29 DIAGNOSIS — N179 Acute kidney failure, unspecified: Secondary | ICD-10-CM

## 2022-08-29 DIAGNOSIS — Z79899 Other long term (current) drug therapy: Secondary | ICD-10-CM

## 2022-08-29 NOTE — Progress Notes (Signed)
Patient presents to the office for a nurse visit to have labs done to recheck kidney functions. States that he currently drinks about 30 oz of water daily.  Mentioned that he does get dizzy sometimes, blood pressure reading today was 96/62. Advised patient that I would let the provider know and to continue monitoring his blood pressure daily and keep a log.

## 2022-08-30 LAB — COMPLETE METABOLIC PANEL WITH GFR
AG Ratio: 1.8 (calc) (ref 1.0–2.5)
ALT: 16 U/L (ref 9–46)
AST: 14 U/L (ref 10–35)
Albumin: 4.4 g/dL (ref 3.6–5.1)
Alkaline phosphatase (APISO): 41 U/L (ref 35–144)
BUN/Creatinine Ratio: 23 (calc) — ABNORMAL HIGH (ref 6–22)
BUN: 36 mg/dL — ABNORMAL HIGH (ref 7–25)
CO2: 22 mmol/L (ref 20–32)
Calcium: 9.9 mg/dL (ref 8.6–10.3)
Chloride: 111 mmol/L — ABNORMAL HIGH (ref 98–110)
Creat: 1.58 mg/dL — ABNORMAL HIGH (ref 0.70–1.22)
Globulin: 2.4 g/dL (calc) (ref 1.9–3.7)
Glucose, Bld: 117 mg/dL — ABNORMAL HIGH (ref 65–99)
Potassium: 4.5 mmol/L (ref 3.5–5.3)
Sodium: 143 mmol/L (ref 135–146)
Total Bilirubin: 0.7 mg/dL (ref 0.2–1.2)
Total Protein: 6.8 g/dL (ref 6.1–8.1)
eGFR: 43 mL/min/{1.73_m2} — ABNORMAL LOW (ref >=60)

## 2022-08-30 NOTE — Progress Notes (Signed)
^<^<^<^<^<^<^<^<^<^<^<^<^<^<^<^<^<^<^<^<^<^<^<^<^<^<^<^<^<^<^<^<^<^<^<^<^ ^>^>^>^>^>^>^>^>^>^>^>>^>^>^>^>^>^>^>^>^>^>^>^>^>^>^>^>^>^>^>^>^>^>^>^>^>  -    Kidney functions improved almost back to baseline in Stage 3a - -  Kidney functions still look a little dehydrated -  Very important to drink adequate amounts of fluids to prevent permanent damage   -  Recommend drink at least 6 bottles (16 ounces) of fluids /water /day = 96 Oz ~100 oz -   100 oz = 3,000 cc or 3 liters / day  - >> That's 1 &1/2 bottles of a 2 liter soda bottle /day !  ^<^<^<^<^<^<^<^<^<^<^<^<^<^<^<^<^<^<^<^<^<^<^<^<^<^<^<^<^<^<^<^<^<^<^<^<^ ^>^>^>^>^>^>^>^>^>^>^>^>^>^>^>^>^>^>^>^>^>^>^>^>^>^>^>^>^>^>^>^>^>^>^>^>^

## 2022-08-31 NOTE — Progress Notes (Signed)
Patient is aware of lab results and instructions. -e welch

## 2022-09-08 ENCOUNTER — Ambulatory Visit: Payer: Medicare Other | Admitting: Nurse Practitioner

## 2022-09-08 NOTE — Progress Notes (Deleted)
Assessment and Plan:  There are no diagnoses linked to this encounter.    Further disposition pending results of labs. Discussed med's effects and SE's.   Over 30 minutes of exam, counseling, chart review, and critical decision making was performed.   Future Appointments  Date Time Provider Department Center  09/08/2022  4:00 PM Raynelle Dick, NP [facility] None  09/20/2022  3:40 PM Armbruster, Willaim Rayas, MD [facility]  11/10/2022  2:30 PM Adela Glimpse, NP [facility] None  02/13/2023  2:00 PM Lucky Cowboy, MD [facility] None  05/17/2023  2:00 PM Adela Glimpse, NP [facility] None    ------------------------------------------------------------------------------------------------------------------   HPI There were no vitals taken for this visit. 82 y.o.male presents for  Past Medical History:  Diagnosis Date   Allergic rhinitis    Bladder stones    CKD (chronic kidney disease), stage III (HCC)    Common iliac aneurysm (HCC) right side   followed by vascular-- dr Myra Gianotti (note in epic)   Environmental and seasonal allergies    External carotid artery stenosis     per last duplex 05-18-2011  right moderate stenosis and left moderate to severe   Hematuria    History of adenomatous polyp of colon    tubular adenoma's   History of bladder stone    History of DVT in adulthood    08/ 2009 right lower extremity;  2013  left lower extremity  (10-11-2018 per pt no dvt since 2013)   History of kidney stones    History of pulmonary embolism    bilatera lung  08/ 2009  (10-11-2018  per pt no pe since 2009)   History of wheezing    allergies--- prn inhaler   Hypertension    Hypogonadism male    Internal carotid artery stenosis, bilateral     per last duplex 05-18-2011 in epic,   bilateral 40-59% stenosis  (10-11-2018  per pt denies stroke/tia S&S)   Lower urinary tract symptoms (LUTS)    Mixed hyperlipidemia    Nephrolithiasis    Testosterone deficiency    Type 2  diabetes mellitus (HCC)    Vitamin D deficiency    Wears glasses      Allergies  Allergen Reactions   Doxycycline     unknown   Niacin And Related Other (See Comments)    flushing   Sulfa Antibiotics Itching    Current Outpatient Medications on File Prior to Visit  Medication Sig   aspirin 81 MG tablet Take 81 mg by mouth daily.   busPIRone (BUSPAR) 5 MG tablet Take     1/2 to 1 tablet     3 x /day      for Anxiety   Cholecalciferol (VITAMIN D3) 5000 units CAPS Take 2 capsules (10,000 Units total) by mouth daily. (Patient taking differently: Take 2 capsules by mouth daily.)   Chromium-Cinnamon (CINNAMON PLUS CHROMIUM) [phone] MCG-MG CAPS Take 2 capsules by mouth daily.   famotidine (PEPCID) 40 MG tablet Take 40 mg by mouth as needed. (Patient not taking: Reported on 05/09/2022)   fenofibrate (TRICOR) 145 MG tablet TAKE 1 TABLET DAILY FOR TRIGLYCERIDES (BLOOD FATS)   finasteride (PROSCAR) 5 MG tablet Take  1 tablet  Daily  for Prostate   fluticasone (FLONASE) 50 MCG/ACT nasal spray Place 2 sprays into both nostrils daily.   glipiZIDE (GLUCOTROL) 10 MG tablet TAKE 1 TABLET BY MOUTH 3 TIMES A DAY WITH MEALS FOR DIABETES   losartan (COZAAR) 25 MG tablet TAKE 1 TABLET DAILY  FOR BLOOD PRESSURE GOAL <140/80 AND KIDNEY PROTECTION.   metFORMIN (GLUCOPHAGE-XR) 500 MG 24 hr tablet TAKE 2 TABLETS TWICE A DAY WITH MEALS FOR DIABETES   methylcellulose (CITRUCEL) oral powder Take 1 packet by mouth daily.   Multiple Vitamins-Minerals (MEGA MULTIVITAMIN FOR MEN) TABS Take 1 tablet by mouth daily.   ONETOUCH ULTRA test strip TEST BLOOD SUGAR ONCE DAILY FOR DIABETES   ranitidine (ZANTAC) 300 MG tablet Take 1 tablet by mouth at bedtime as needed. (Patient not taking: Reported on 05/09/2022)   rosuvastatin (CRESTOR) 5 MG tablet TAKE 1 TABLET BY MOUTH EVERY DAY FOR CHOLESTEROL   Semaglutide, 2 MG/DOSE, (OZEMPIC, 2 MG/DOSE,) 8 MG/3ML SOPN Inject  2 mg  into skin  weekly  for Diabetes   tamsulosin (FLOMAX)  0.4 MG CAPS capsule Take  1 tablet  at Bedtime  for Prostate   No current facility-administered medications on file prior to visit.    ROS: all negative except above.   Physical Exam:  There were no vitals taken for this visit.  General Appearance: Well nourished, in no apparent distress. Eyes: PERRLA, EOMs, conjunctiva no swelling or erythema Sinuses: No Frontal/maxillary tenderness ENT/Mouth: Ext aud canals clear, TMs without erythema, bulging. No erythema, swelling, or exudate on post pharynx.  Tonsils not swollen or erythematous. Hearing normal.  Neck: Supple, thyroid normal.  Respiratory: Respiratory effort normal, BS equal bilaterally without rales, rhonchi, wheezing or stridor.  Cardio: RRR with no MRGs. Brisk peripheral pulses without edema.  Abdomen: Soft, + BS.  Non tender, no guarding, rebound, hernias, masses. Lymphatics: Non tender without lymphadenopathy.  Musculoskeletal: Full ROM, 5/5 strength, normal gait.  Skin: Warm, dry without rashes, lesions, ecchymosis.  Neuro: Cranial nerves intact. Normal muscle tone, no cerebellar symptoms. Sensation intact.  Psych: Awake and oriented X 3, normal affect, Insight and Judgment appropriate.     Raynelle Dick, NP 11:46 AM [facility]

## 2022-09-20 ENCOUNTER — Ambulatory Visit: Payer: Medicare Other | Admitting: Gastroenterology

## 2022-09-20 ENCOUNTER — Other Ambulatory Visit: Payer: Medicare Other

## 2022-09-20 ENCOUNTER — Encounter: Payer: Self-pay | Admitting: Gastroenterology

## 2022-09-20 VITALS — BP 110/60 | HR 59 | Ht 71.0 in | Wt 188.0 lb

## 2022-09-20 DIAGNOSIS — R933 Abnormal findings on diagnostic imaging of other parts of digestive tract: Secondary | ICD-10-CM | POA: Diagnosis not present

## 2022-09-20 DIAGNOSIS — K862 Cyst of pancreas: Secondary | ICD-10-CM

## 2022-09-20 NOTE — Patient Instructions (Signed)
Please go to the lab in the basement of our building to have lab work done as you leave today. Hit "B" for basement when you get on the elevator.  When the doors open the lab is on your left.  We will call you with the results. Thank you.  Thank you for entrusting me with your care and for choosing [facility], Dr. Ileene Patrick    If your blood pressure at your visit was 140/90 or greater, please contact your primary care physician to follow up on this. ______________________________________________________  If you are age 77 or older, your body mass index should be between 23-30. Your Body mass index is 26.22 kg/m. If this is out of the aforementioned range listed, please consider follow up with your Primary Care Provider.  If you are age 87 or younger, your body mass index should be between 19-25. Your Body mass index is 26.22 kg/m. If this is out of the aformentioned range listed, please consider follow up with your Primary Care Provider.  ________________________________________________________  The [facility] providers would like to encourage you to use [facility] to communicate with providers for non-urgent requests or questions.  Due to long hold times on the telephone, sending your provider a message by [facility] may be a faster and more efficient way to get a response.  Please allow 48 business hours for a response.  Please remember that this is for non-urgent requests.  _______________________________________________________  Due to recent changes in healthcare laws, you may see the results of your imaging and laboratory studies on MyChart before your provider has had a chance to review them.  We understand that in some cases there may be results that are confusing or concerning to you. Not all laboratory results come back in the same time frame and the provider may be waiting for multiple results in order to interpret others.  Please give [place] 48 hours in order for your provider  to thoroughly review all the results before contacting the office for clarification of your results.

## 2022-09-20 NOTE — Progress Notes (Signed)
HPI :  82 year old male here for a follow-up visit to discuss his history of pancreatic cysts and abnormal imaging of his pancreas.  Recall he had an ultrasound back in December 2021 for some intermittent abdominal pains.  He was found to have gallstones but more notably changes in his pancreas which led to an MR I.  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  A repeat MRI was done in August 2022 which showed a grossly stable appearance of his pancreas with similar changes.  He had a surveillance MRCP March 2023.  There were changes of chronic pancreatitis with severe pancreatic atrophy and a tortuous dilated main pancreatic duct which had increased in diameter compared to previous.  He had numerous cystic lesions, the largest was 25 mm in size at the uncinate process.  Radiology recommended a follow-up MRI in 12 months.  Most recently had a follow-up MRCP in April 2024, changes as below.  There is severely atrophic pancreas with diffuse dilation of the PD measuring up to 1.1 cm along with numerous small cystic lesions throughout the pancreas with a dominant lesion at the uncinate process measuring 2.3 x 1.6 cm.  There was no solid component or concerning mass lesions.  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  I had called him about the findings and we discussed possible EUS versus MRCP.  He wanted to talk with his primary care physician and is now following up to discuss this further.  He denies any family history of pancreatic cancer or pancreatitis.  He denies any known history of pancreatitis himself.  He does have a tobacco history in the past but quit.  He denies any significant alcohol use.  He generally  feels well without any complaints.  He has no weight loss, his weight is stable in the 180s, has been on Ozempic for diabetes and states that is stable after initially losing weight on higher dosing of it.  He denies any problems with his bowels.  No fat or grease in the stools.  No abdominal pains.  He eats well and has a great appetite.  He denies any cardiopulmonary symptoms.  He denies any problems with anesthesia in the past.    We had discussed doing a CA 19-9 level after our last conversation but was not able to come to the lab and his wife had an illness.   Colonoscopy 10/25/2017: The perianal and digital rectal examinations were                            normal.                           The terminal ileum appeared normal.                           Two sessile polyps were found in the cecum. The                            polyps were 3 to 4 mm in size. These polyps were  removed with a cold snare. Resection and retrieval                            were complete.                           Four sessile polyps were found in the ascending                            colon. The polyps were 3 to 5 mm in size. These                            polyps were removed with a cold snare. Resection                            and retrieval were complete.                           Three sessile polyps were found in the sigmoid                            colon. The polyps were 3 to 4 mm in size. These                            polyps were removed with a cold snare. Resection                            and retrieval were complete.                           Internal hemorrhoids were found during retroflexion.                           The exam was otherwise without abnormality.   Surgical [P], ascending and cecum, sigmoid, polyp (9) - TUBULAR ADENOMA(S). - NEGATIVE FOR HIGH GRADE DYSPLASIA OR MALIGNANCY.     MRCP 05/24/21: IMPRESSION: 1. Evidence of chronic pancreatitis  with severe pancreatic atrophy and a tortuous dilated main pancreatic duct which is mildly increased in diameter since previous study. 2. Numerous cystic foci again seen throughout the pancreas as described which are mildly enlarged since previous study. 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 No suspicious enhancement visualized. Recommend follow-up MRI in 12 months. 3. Cholelithiasis. 4. Hepatic steatosis. 5. Right renal atrophy.    MRCP 07/09/22: IMPRESSION: 1. Examination is significantly limited by breath motion artifact. 2. Within this limitation, severely atrophic pancreas, with diffuse dilatation of the pancreatic duct along its length, measuring up to 1.1 cm in caliber, increased compared to prior examination. 3. Numerous small cystic lesions are scattered throughout the pancreas, with a dominant lesion of the pancreatic uncinate measuring 2.3 x 1.6 cm. No solid component or suspicious contrast enhancement. 4. 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 Recommend consideration of EUS/FNA. 5. Cholelithiasis. 6. Hepatic steatosis.   Past Medical History:  Diagnosis Date  Allergic rhinitis    Bladder stones    CKD (chronic kidney disease), stage III (HCC)    Common iliac aneurysm (HCC) right side   followed by vascular-- dr Myra Gianotti (note in epic)   Environmental and seasonal allergies    External carotid artery stenosis     per last duplex 05-18-2011  right moderate stenosis and left moderate to severe   Hematuria    History of adenomatous polyp of colon    tubular adenoma's   History of bladder stone    History of DVT in adulthood    08/ 2009 right lower extremity;  2013  left lower extremity  (10-11-2018 per pt no dvt since 2013)   History of kidney stones     History of pulmonary embolism    bilatera lung  08/ 2009  (10-11-2018  per pt no pe since 2009)   History of wheezing    allergies--- prn inhaler   Hypertension    Hypogonadism male    Internal carotid artery stenosis, bilateral     per last duplex 05-18-2011 in epic,   bilateral 40-59% stenosis  (10-11-2018  per pt denies stroke/tia S&S)   Lower urinary tract symptoms (LUTS)    Mixed hyperlipidemia    Nephrolithiasis    Testosterone deficiency    Type 2 diabetes mellitus (HCC)    Vitamin D deficiency    Wears glasses      Past Surgical History:  Procedure Laterality Date   COLONOSCOPY  last one 10-25-2017   CYSTOSCOPY WITH LITHOLAPAXY N/A 07/18/2016   Procedure: CYSTOSCOPY WITH LITHOLAPAXY;  Surgeon: Ihor Gully, MD;  Location: [facility];  Service: Urology;  Laterality: N/A;   CYSTOSCOPY WITH LITHOLAPAXY N/A 10/15/2018   Procedure: CYSTOSCOPY WITH LITHOLAPAXY;  Surgeon: Ihor Gully, MD;  Location: [facility];  Service: Urology;  Laterality: N/A;   IVC FILTER INSERTION  11/21/2007   IVC FILTER REMOVAL  01/31/2008   ORCHIOPEXY Bilateral 1950's   undescended testis's   RIGHT CHEEK/MOUTH BIOPSY  09/16/2008  (office)   squamous hyperplasia w/ hyperkeratosis   RIGHT DEEP CERVICAL NODE BIOPSY  01/03/2001   right posterior neck (per path report-- lymphoid hyperplasia w/ florid follicular hyperplasia, no malignancy(   TONSILLECTOMY  age 64   Family History  Problem Relation Age of Onset   Alzheimer's disease Mother    Diabetes Mother    Prostate cancer Father    Breast cancer Sister    Colon cancer Neg Hx    Rectal cancer Neg Hx    Stomach cancer Neg Hx    Esophageal cancer Neg Hx    Social History   Tobacco Use   Smoking status: Former    Packs/day: 1.00    Years: 20.00    Additional pack years: 0.00    Total pack years: 20.00    Types: Cigarettes, Cigars    Quit date: 04/16/1991    Years since quitting: 31.4   Smokeless tobacco:  Never  Vaping Use   Vaping Use: Never used  Substance Use Topics   Alcohol use: Yes    Comment: ocassional    Drug use: Never   Current Outpatient Medications  Medication Sig Dispense Refill   aspirin 81 MG tablet Take 81 mg by mouth daily.     busPIRone (BUSPAR) 5 MG tablet Take     1/2 to 1 tablet     3 x /day      for Anxiety 30 tablet 0   Cholecalciferol (  VITAMIN D3) 5000 units CAPS Take 2 capsules (10,000 Units total) by mouth daily. (Patient taking differently: Take 2 capsules by mouth daily.)     Chromium-Cinnamon (CINNAMON PLUS CHROMIUM) [phone] MCG-MG CAPS Take 2 capsules by mouth daily.     fenofibrate (TRICOR) 145 MG tablet TAKE 1 TABLET DAILY FOR TRIGLYCERIDES (BLOOD FATS) 90 tablet 3   finasteride (PROSCAR) 5 MG tablet Take  1 tablet  Daily  for Prostate 90 tablet 3   fluticasone (FLONASE) 50 MCG/ACT nasal spray Place 2 sprays into both nostrils daily. 16 g 2   glipiZIDE (GLUCOTROL) 10 MG tablet TAKE 1 TABLET BY MOUTH 3 TIMES A DAY WITH MEALS FOR DIABETES 270 tablet 1   losartan (COZAAR) 25 MG tablet TAKE 1 TABLET DAILY FOR BLOOD PRESSURE GOAL <140/80 AND KIDNEY PROTECTION. 90 tablet 3   metFORMIN (GLUCOPHAGE-XR) 500 MG 24 hr tablet TAKE 2 TABLETS TWICE A DAY WITH MEALS FOR DIABETES 360 tablet 3   methylcellulose (CITRUCEL) oral powder Take 1 packet by mouth daily.     Multiple Vitamins-Minerals (MEGA MULTIVITAMIN FOR MEN) TABS Take 1 tablet by mouth daily.     ONETOUCH ULTRA test strip TEST BLOOD SUGAR ONCE DAILY FOR DIABETES 100 strip 5   rosuvastatin (CRESTOR) 5 MG tablet TAKE 1 TABLET BY MOUTH EVERY DAY FOR CHOLESTEROL 90 tablet 2   Semaglutide, 2 MG/DOSE, (OZEMPIC, 2 MG/DOSE,) 8 MG/3ML SOPN Inject  2 mg  into skin  weekly  for Diabetes 90 mL 3   tamsulosin (FLOMAX) 0.4 MG CAPS capsule Take  1 tablet  at Bedtime  for Prostate 90 capsule 3   famotidine (PEPCID) 40 MG tablet Take 40 mg by mouth as needed. (Patient not taking: Reported on 05/09/2022)     No current  facility-administered medications for this visit.   Allergies  Allergen Reactions   Doxycycline     unknown   Niacin And Related Other (See Comments)    flushing   Sulfa Antibiotics Itching     Review of Systems: All systems reviewed and negative except where noted in HPI.    Lab Results  Component Value Date   WBC 7.9 08/09/2022   HGB 12.3 (L) 08/09/2022   HCT 36.5 (L) 08/09/2022   MCV 94.3 08/09/2022   PLT 152 08/09/2022    Lab Results  Component Value Date   ALT 16 08/29/2022   AST 14 08/29/2022   ALKPHOS 51 03/20/2020   BILITOT 0.7 08/29/2022    Lab Results  Component Value Date   CREATININE 1.58 (H) 08/29/2022   BUN 36 (H) 08/29/2022   NA 143 08/29/2022   K 4.5 08/29/2022   CL 111 (H) 08/29/2022   CO2 22 08/29/2022     Physical Exam: BP 110/60   Pulse (!) 59   Ht 5\' 11"  (1.803 m)   Wt 188 lb (85.3 kg)   BMI 26.22 kg/m  Constitutional: Pleasant,well-developed, male in no acute distress. Neurological: Alert and oriented to person place and time. Psychiatric: Normal mood and affect. Behavior is normal.   ASSESSMENT: 82 y.o. male here for assessment of the following  1. Pancreatic cyst   2. Abnormal finding on GI tract imaging    As above, patient with abnormal MRCP with changes concerning for IPMN and chronic pancreatitis in the past.  History of tobacco use, no significant EtOH or family history of pancreatic cancer.  Findings have been progressive over the last few MRCP's, he has no symptoms from this.  I discussed changes  with him on the MRI and discussed what this means, at risk for pancreatic malignancy.  We discussed if he wanted to proceed with surveillance with either an EUS or MRCP and I discussed what both of those entailed.  I had previously discussed his case with Dr. Meridee Score who offered him an EUS if he wanted it in the past.  We discussed what that would entail, and if it would change management in regards to, if he had a malignancy  would he want it treated, have surgery etc, if he was a candidate.  He is in good shape for his age without any significant cardiopulmonary comorbidities, he would want surgery if there was something there that needed to be resected and he was a candidate for it.  In this light after discussion his options he really wants to pursue an EUS.  In fact he thinks Dr. Meridee Score may have recently done an exam for his wife as well.  I will touch base with Dr. Meridee Score to see if he is still in to offer the EUS and schedule him if agreeable.  In the interim I asked him to go to the lab for CA 19-9 level.  I also counseled him to get access to his MyChart account so he can have these records from [facility] for his own information as he follows up with his primary care in the [place].  He agrees.  PLAN: - lab for lab CA 19-9 - will discuss his case with Dr. Meridee Score and schedule for EUS if agreeable - patient will sign up for Mychart account to get access to his records.  Harlin Rain, MD [facility]

## 2022-09-21 LAB — CANCER ANTIGEN 19-9: CA 19-9: 34 U/mL — ABNORMAL HIGH (ref <34)

## 2022-09-26 ENCOUNTER — Telehealth: Payer: Self-pay

## 2022-09-26 DIAGNOSIS — R933 Abnormal findings on diagnostic imaging of other parts of digestive tract: Secondary | ICD-10-CM

## 2022-09-26 DIAGNOSIS — K862 Cyst of pancreas: Secondary | ICD-10-CM

## 2022-09-26 NOTE — Telephone Encounter (Signed)
----- Message from Lemar Lofty., MD sent at 09/25/2022  3:23 AM EDT ----- Regarding: RE: EUS request SA, Sounds like a plan.  Joshua Peterson, Please move forward with scheduling this patient's EUS in September. Thanks. GM ----- Message ----- From: Benancio Deeds, MD Sent: 09/23/2022   4:22 PM EDT To: Loretha Stapler, RN; Lemar Lofty., MD; # Subject: RE: EUS request                                Thanks very much Gabe.  Appreciate your help with this.  I had a good talk with him about it, he wants to proceed with EUS and he is okay to wait till September, given this is been going on for some time.  His CA 19-9 level upper limit of normal.  He is also willing to meet with a surgeon in the interim.  Joshua Peterson can you help schedule the patient with Dr. Meridee Score for EUS at the hospital?  [facility] can you please refer the patient to [facility] surgery -Dr. Donell Beers or Dr. 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  Thank you    ----- Message ----- From: Lemar Lofty., MD Sent: 09/22/2022  12:29 AM EDT To: Benancio Deeds, MD Subject: RE: EUS request                                SA, I have never met this patient's wife (her chart suggest that she is an [facility] patient). I will not have any availability until September at this point, so if something wants to be done sooner he may need to be referred elsewhere. 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 Donell Beers and Dr. Freida Busman) so that they can discuss what a potential surgery could involve if this ends up being a true main duct IPMN.  I am not sure that he would really want to undergo a high risk surgery even if he is very well adept. That will also allow [place] time for him to undergo the EUS. Let me know if you are amenable to this and if so then we can work on scheduling (certainly  if you have earlier availability due to a cancellation we can work on that 2). Thanks. GM ----- Message ----- From: Benancio Deeds, MD Sent: 09/20/2022   5:35 PM EDT To: Lemar Lofty., MD Subject: EUS request                                    Joshua Peterson,  I spoke with you earlier about this patient a few months ago.  He has an abnormal MRCP with progressive changes over the past year, concerning for IPMN.  He is in really good shape for his age, we had previously discussed surveillance MRCP versus EUS.  He has had time to think about this and is asking to proceed with an EUS if you are agreeable with that.  If you found something that warranted surgery and he was a candidate for it, he would want to proceed with aggressive therapy.  I think you may have actually  done an EUS on his wife recently?  In any case, if you are agreeable can we have him scheduled?  Thanks for your help as always.  Brett Canales

## 2022-09-26 NOTE — Telephone Encounter (Signed)
Joshua Peterson can you please refer the patient to [facility] surgery -Dr. Donell Beers or Dr. 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 Thank you   Referral, records, pt's demographic, and insurance information have been faxed to [facility] with request for an appt with Dr. Donell Beers or Dr. Freida Busman.

## 2022-09-30 ENCOUNTER — Other Ambulatory Visit: Payer: Self-pay

## 2022-09-30 DIAGNOSIS — R933 Abnormal findings on diagnostic imaging of other parts of digestive tract: Secondary | ICD-10-CM

## 2022-09-30 DIAGNOSIS — K862 Cyst of pancreas: Secondary | ICD-10-CM

## 2022-09-30 NOTE — Addendum Note (Signed)
Addended byLucky Rathke on: 09/30/2022 05:02 PM   Modules accepted: Orders

## 2022-09-30 NOTE — Telephone Encounter (Signed)
Spoke with patient He has been scheduled for 12/14/22 @ [facility] for EUS. Instructions will be mailed to patient. I will also make a pre-visit appointment to ensure that patient understands instructions. Patient was also made aware that referral was sent to [facility]. Someone from their office should be contacting him to schedule appointment, if he has not heard from their office in 1-2 weeks, he will let us know. Patient voiced understanding.

## 2022-10-03 ENCOUNTER — Other Ambulatory Visit: Payer: Self-pay

## 2022-10-03 ENCOUNTER — Telehealth: Payer: Self-pay

## 2022-10-03 DIAGNOSIS — R933 Abnormal findings on diagnostic imaging of other parts of digestive tract: Secondary | ICD-10-CM

## 2022-10-03 DIAGNOSIS — K862 Cyst of pancreas: Secondary | ICD-10-CM

## 2022-10-03 NOTE — Telephone Encounter (Signed)
Pt has been scheduled on 9/25 at 9 am at [facility] with GM

## 2022-10-03 NOTE — Telephone Encounter (Signed)
----- Message from Lemar Lofty sent at 09/25/2022  3:23 AM EDT ----- Regarding: RE: EUS request SA, Sounds like a plan.  Lyric Rossano, Please move forward with scheduling this patient's EUS in September. Thanks. GM ----- Message ----- From: Benancio Deeds, MD Sent: 09/23/2022   4:22 PM EDT To: Loretha Stapler, RN; Lemar Lofty., MD; # Subject: RE: EUS request                                Thanks very much Gabe.  Appreciate your help with this.  I had a good talk with him about it, he wants to proceed with EUS and he is okay to wait till September, given this is been going on for some time.  His CA 19-9 level upper limit of normal.  He is also willing to meet with a surgeon in the interim.  Sharayah Renfrow can you help schedule the patient with Dr. Meridee Score for EUS at the hospital?  [facility] can you please refer the patient to [facility] surgery -Dr. Donell Beers or Dr. 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  Thank you    ----- Message ----- From: Lemar Lofty., MD Sent: 09/22/2022  12:29 AM EDT To: Benancio Deeds, MD Subject: RE: EUS request                                SA, I have never met this patient's wife (her chart suggest that she is an [facility] patient). I will not have any availability until September at this point, so if something wants to be done sooner he may need to be referred elsewhere. 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 Donell Beers and Dr. Freida Busman) so that they can discuss what a potential surgery could involve if this ends up being a true main duct IPMN.  I am not sure that he would really want to undergo a high risk surgery even if he is very well adept. That will also allow [place] time for him to undergo the EUS. Let me know if you are amenable to this and if so then we can work on scheduling (certainly if  you have earlier availability due to a cancellation we can work on that 2). Thanks. GM ----- Message ----- From: Benancio Deeds, MD Sent: 09/20/2022   5:35 PM EDT To: Lemar Lofty., MD Subject: EUS request                                    [name],  I spoke with you earlier about this patient a few months ago.  He has an abnormal MRCP with progressive changes over the past year, concerning for IPMN.  He is in really good shape for his age, we had previously discussed surveillance MRCP versus EUS.  He has had time to think about this and is asking to proceed with an EUS if you are agreeable with that.  If you found something that warranted surgery and he was a candidate for it, he would want to proceed with aggressive therapy.  I think you may have actually done  an EUS on his wife recently?  In any case, if you are agreeable can we have him scheduled?  Thanks for your help as always.  Brett Canales

## 2022-11-02 ENCOUNTER — Other Ambulatory Visit: Payer: Self-pay | Admitting: Nurse Practitioner

## 2022-11-02 DIAGNOSIS — E1122 Type 2 diabetes mellitus with diabetic chronic kidney disease: Secondary | ICD-10-CM

## 2022-11-03 ENCOUNTER — Other Ambulatory Visit: Payer: Self-pay | Admitting: Nurse Practitioner

## 2022-11-09 ENCOUNTER — Emergency Department (HOSPITAL_COMMUNITY)
Admission: EM | Admit: 2022-11-09 | Discharge: 2022-11-09 | Disposition: A | Discharge status: Home / Self Care | Payer: Medicare Other

## 2022-11-09 ENCOUNTER — Other Ambulatory Visit: Payer: Self-pay

## 2022-11-09 ENCOUNTER — Emergency Department (HOSPITAL_COMMUNITY): Payer: Medicare Other

## 2022-11-09 DIAGNOSIS — Z86718 Personal history of other venous thrombosis and embolism: Secondary | ICD-10-CM | POA: Diagnosis not present

## 2022-11-09 DIAGNOSIS — R55 Syncope and collapse: Secondary | ICD-10-CM | POA: Insufficient documentation

## 2022-11-09 DIAGNOSIS — U071 COVID-19: Secondary | ICD-10-CM | POA: Diagnosis not present

## 2022-11-09 DIAGNOSIS — I10 Essential (primary) hypertension: Secondary | ICD-10-CM | POA: Diagnosis not present

## 2022-11-09 DIAGNOSIS — Z79899 Other long term (current) drug therapy: Secondary | ICD-10-CM | POA: Diagnosis not present

## 2022-11-09 DIAGNOSIS — Z7982 Long term (current) use of aspirin: Secondary | ICD-10-CM | POA: Diagnosis not present

## 2022-11-09 LAB — CBC WITH DIFFERENTIAL/PLATELET
Abs Immature Granulocytes: 0.06 10*3/uL (ref 0.00–0.07)
Basophils Absolute: 0 10*3/uL (ref 0.0–0.1)
Basophils Relative: 0 %
Eosinophils Absolute: 0 10*3/uL (ref 0.0–0.5)
Eosinophils Relative: 1 %
HCT: 35 % — ABNORMAL LOW (ref 39.0–52.0)
Hemoglobin: 11.7 g/dL — ABNORMAL LOW (ref 13.0–17.0)
Immature Granulocytes: 1 %
Lymphocytes Relative: 15 %
Lymphs Abs: 1 10*3/uL (ref 0.7–4.0)
MCH: 31.4 pg (ref 26.0–34.0)
MCHC: 33.4 g/dL (ref 30.0–36.0)
MCV: 93.8 fL (ref 80.0–100.0)
Monocytes Absolute: 0.5 10*3/uL (ref 0.1–1.0)
Monocytes Relative: 9 %
Neutro Abs: 4.7 10*3/uL (ref 1.7–7.7)
Neutrophils Relative %: 74 %
Platelets: 111 10*3/uL — ABNORMAL LOW (ref 150–400)
RBC: 3.73 MIL/uL — ABNORMAL LOW (ref 4.22–5.81)
RDW: 12.7 % (ref 11.5–15.5)
WBC: 6.3 10*3/uL (ref 4.0–10.5)
nRBC: 0 % (ref 0.0–0.2)

## 2022-11-09 LAB — TROPONIN I (HIGH SENSITIVITY)
Troponin I (High Sensitivity): 8 ng/L (ref <18)
Troponin I (High Sensitivity): 9 ng/L (ref <18)

## 2022-11-09 LAB — URINALYSIS, ROUTINE W REFLEX MICROSCOPIC
Bacteria, UA: NONE SEEN
Bilirubin Urine: NEGATIVE
Glucose, UA: >=500 mg/dL — AB
Hgb urine dipstick: NEGATIVE
Ketones, ur: NEGATIVE mg/dL
Leukocytes,Ua: NEGATIVE
Nitrite: NEGATIVE
Protein, ur: 30 mg/dL — AB
Specific Gravity, Urine: 1.016 (ref 1.005–1.030)
pH: 5 (ref 5.0–8.0)

## 2022-11-09 LAB — CBG MONITORING, ED: Glucose-Capillary: 233 mg/dL — ABNORMAL HIGH (ref 70–99)

## 2022-11-09 LAB — COMPREHENSIVE METABOLIC PANEL
ALT: 21 U/L (ref 0–44)
AST: 19 U/L (ref 15–41)
Albumin: 3.7 g/dL (ref 3.5–5.0)
Alkaline Phosphatase: 45 U/L (ref 38–126)
Anion gap: 12 (ref 5–15)
BUN: 29 mg/dL — ABNORMAL HIGH (ref 8–23)
CO2: 19 mmol/L — ABNORMAL LOW (ref 22–32)
Calcium: 9 mg/dL (ref 8.9–10.3)
Chloride: 104 mmol/L (ref 98–111)
Creatinine, Ser: 1.77 mg/dL — ABNORMAL HIGH (ref 0.61–1.24)
GFR, Estimated: 38 mL/min — ABNORMAL LOW (ref >60)
Glucose, Bld: 260 mg/dL — ABNORMAL HIGH (ref 70–99)
Potassium: 4.3 mmol/L (ref 3.5–5.1)
Sodium: 135 mmol/L (ref 135–145)
Total Bilirubin: 0.8 mg/dL (ref 0.3–1.2)
Total Protein: 7 g/dL (ref 6.5–8.1)

## 2022-11-09 LAB — RESP PANEL BY RT-PCR (RSV, FLU A&B, COVID)  RVPGX2
Influenza A by PCR: NEGATIVE
Influenza B by PCR: NEGATIVE
Resp Syncytial Virus by PCR: NEGATIVE
SARS Coronavirus 2 by RT PCR: POSITIVE — AB

## 2022-11-09 MED ORDER — SODIUM CHLORIDE 0.9 % IV BOLUS
500.0000 mL | Freq: Once | INTRAVENOUS | Status: AC
  Administered 2022-11-09: 500 mL via INTRAVENOUS

## 2022-11-09 MED ORDER — ALUM & MAG HYDROXIDE-SIMETH 200-200-20 MG/5ML PO SUSP
30.0000 mL | Freq: Once | ORAL | Status: AC
  Administered 2022-11-09: 30 mL via ORAL
  Filled 2022-11-09: qty 30

## 2022-11-09 NOTE — ED Provider Notes (Signed)
[place] EMERGENCY DEPARTMENT AT [facility] Provider Note   CSN: [id number] Arrival date & time: 11/09/22  4098     History  Chief Complaint  Patient presents with   Near Syncope    Joshua Peterson is a 82 y.o. male.  This is an 82 year old male presenting emergency department with a chief complaint of syncope.  Patient stated that he is not been feeling well for the past week with generalized malaise, decreased p.o. intake, rhinorrhea and sore throat.  He reports he went to the bathroom this morning, was trying to urinate when he got lightheaded, lowered himself to the ground and then blacked out.  He is unsure how long he was on the ground for.  Denies any preceding chest pain, shortness of breath.  No palpitations.  States he continues to feel off like he did for the past week, but mostly back to baseline.  Denies prior cardiac history.   Near Syncope       Home Medications Prior to Admission medications   Medication Sig Start Date End Date Taking? Authorizing Provider  aspirin 81 MG tablet Take 81 mg by mouth daily.    [provider]  busPIRone (BUSPAR) 5 MG tablet Take     1/2 to 1 tablet     3 x /day      for Anxiety 05/09/22   Adela Glimpse, NP  Cholecalciferol (VITAMIN D3) 5000 units CAPS Take 2 capsules (10,000 Units total) by mouth daily. Patient taking differently: Take 2 capsules by mouth daily. 01/12/18   Judd Gaudier, NP  Chromium-Cinnamon (CINNAMON PLUS CHROMIUM) [phone] MCG-MG CAPS Take 2 capsules by mouth daily.    [provider]  famotidine (PEPCID) 40 MG tablet Take 40 mg by mouth as needed. Patient not taking: Reported on 05/09/2022 09/07/18   [provider]  fenofibrate (TRICOR) 145 MG tablet TAKE 1 TABLET DAILY FOR TRIGLYCERIDES (BLOOD FATS) 10/07/20   Judd Gaudier, NP  finasteride (PROSCAR) 5 MG tablet Take  1 tablet  Daily  for Prostate 01/19/22   Lucky Cowboy, MD  fluticasone [facility]) 50 MCG/ACT nasal  spray SPRAY 2 SPRAYS INTO EACH NOSTRIL EVERY DAY 11/03/22   Adela Glimpse, NP  glipiZIDE (GLUCOTROL) 10 MG tablet TAKE 1 TABLET BY MOUTH 3 TIMES A DAY WITH MEALS FOR DIABETES 11/02/22   Raynelle Dick, NP  losartan (COZAAR) 25 MG tablet TAKE 1 TABLET DAILY FOR BLOOD PRESSURE GOAL <140/80 AND KIDNEY PROTECTION. 05/06/21   Judd Gaudier, NP  metFORMIN (GLUCOPHAGE-XR) 500 MG 24 hr tablet TAKE 2 TABLETS TWICE A DAY WITH MEALS FOR DIABETES 01/21/22   Raynelle Dick, NP  methylcellulose (CITRUCEL) oral powder Take 1 packet by mouth daily. 04/18/22   Armbruster, Willaim Rayas, MD  Multiple Vitamins-Minerals (MEGA MULTIVITAMIN FOR MEN) TABS Take 1 tablet by mouth daily.    [provider]  [facility] ULTRA test strip TEST BLOOD SUGAR ONCE DAILY FOR DIABETES 08/03/22   Raynelle Dick, NP  rosuvastatin (CRESTOR) 5 MG tablet TAKE 1 TABLET BY MOUTH EVERY DAY FOR CHOLESTEROL 06/16/20   Elder Negus, NP  Semaglutide, 2 MG/DOSE, (OZEMPIC, 2 MG/DOSE,) 8 MG/3ML SOPN Inject  2 mg  into skin  weekly  for Diabetes 01/19/22   Lucky Cowboy, MD  tamsulosin [facility]) 0.4 MG CAPS capsule Take  1 tablet  at Bedtime  for Prostate 01/19/22   Lucky Cowboy, MD      Allergies    Doxycycline, Niacin and related, and Sulfa antibiotics  Review of Systems   Review of Systems  Cardiovascular:  Positive for near-syncope.    Physical Exam Updated Vital Signs BP (!) 141/74   Pulse 72   Temp (!) 97.5 F (36.4 C)   Resp 18   Ht 5\' 10"  (1.778 m)   Wt 85.7 kg   SpO2 100%   BMI 27.12 kg/m  Physical Exam Vitals and nursing note reviewed.  Constitutional:      General: He is not in acute distress.    Appearance: He is obese.  HENT:     Head: Normocephalic.     Mouth/Throat:     Mouth: Mucous membranes are moist.  Eyes:     Conjunctiva/sclera: Conjunctivae normal.  Cardiovascular:     Rate and Rhythm: Normal rate and regular rhythm.  Pulmonary:     Effort: Pulmonary effort is normal.     Breath  sounds: Normal breath sounds.  Abdominal:     General: Abdomen is flat. There is no distension.     Tenderness: There is no abdominal tenderness. There is no guarding or rebound.  Musculoskeletal:     Right lower leg: No edema.     Left lower leg: No edema.  Skin:    General: Skin is warm.     Capillary Refill: Capillary refill takes less than 2 seconds.  Neurological:     Mental Status: He is alert and oriented to person, place, and time.  Psychiatric:        Mood and Affect: Mood normal.        Behavior: Behavior normal.     ED Results / Procedures / Treatments   Labs (all labs ordered are listed, but only abnormal results are displayed) Labs Reviewed  RESP PANEL BY RT-PCR (RSV, FLU A&B, COVID)  RVPGX2 - Abnormal; Notable for the following components:      Result Value   SARS Coronavirus 2 by RT PCR POSITIVE (*)    All other components within normal limits  URINALYSIS, ROUTINE W REFLEX MICROSCOPIC - Abnormal; Notable for the following components:   Color, Urine AMBER (*)    APPearance HAZY (*)    Glucose, UA >=500 (*)    Protein, ur 30 (*)    All other components within normal limits  CBC WITH DIFFERENTIAL/PLATELET - Abnormal; Notable for the following components:   RBC 3.73 (*)    Hemoglobin 11.7 (*)    HCT 35.0 (*)    Platelets 111 (*)    All other components within normal limits  COMPREHENSIVE METABOLIC PANEL - Abnormal; Notable for the following components:   CO2 19 (*)    Glucose, Bld 260 (*)    BUN 29 (*)    Creatinine, Ser 1.77 (*)    GFR, Estimated 38 (*)    All other components within normal limits  CBG MONITORING, ED - Abnormal; Notable for the following components:   Glucose-Capillary 233 (*)    All other components within normal limits  URINE CULTURE  TROPONIN I (HIGH SENSITIVITY)  TROPONIN I (HIGH SENSITIVITY)    EKG EKG Interpretation Date/Time:  Wednesday November 09 2022 09:04:50 EDT Ventricular Rate:  64 PR Interval:  198 QRS  Duration:  105 QT Interval:  431 QTC Calculation: 445 R Axis:   -49  Text Interpretation: Sinus rhythm Incomplete RBBB and LAFB Abnormal R-wave progression, early transition Confirmed by Estanislado Pandy [id number]) on 11/09/2022 11:24:00 AM  Radiology DG Chest 2 View  Result Date: 11/09/2022 CLINICAL DATA:  syncope EXAM: CHEST -  2 VIEW COMPARISON:  CXR 03/20/20 FINDINGS: The heart size and mediastinal contours are within normal limits. Both lungs are clear. The visualized skeletal structures are unremarkable. IMPRESSION: No focal airspace opacity Electronically Signed   By: Lorenza Cambridge M.D.   On: 11/09/2022 11:22    Procedures Procedures    Medications Ordered in ED Medications  sodium chloride 0.9 % bolus 500 mL (0 mLs Intravenous Stopped 11/09/22 1033)  alum & mag hydroxide-simeth (MAALOX/MYLANTA) 200-200-20 MG/5ML suspension 30 mL (30 mLs Oral Given 11/09/22 1056)  sodium chloride 0.9 % bolus 500 mL (0 mLs Intravenous Stopped 11/09/22 1116)    ED Course/ Medical Decision Making/ A&P Clinical Course as of 11/09/22 1622  Wed Nov 09, 2022  0951 CBC WITH DIFFERENTIAL(!) No evidence of acute blood loss anemia. [TY]  1036 SARS Coronavirus 2 by RT PCR(!): POSITIVE [TY]  1312 Comprehensive metabolic panel(!) No significant metabolic derangements.  Appears to have baseline CKD. [TY]  1312 Glucose-Capillary(!): 233 Not in DKA.  Did not take his antihyperglycemic's today. [TY]  1312 Troponin I (High Sensitivity): 9 Negative x 2.  ACS unlikely. [TY]  1312 Patient observed in the emergency department no further episodes.  Appears to be normal sinus rhythm on the monitor.  Hemodynamically stable.  Asymptomatic.  Was slightly orthostatic, received IV fluids.  His presentation today likely micturition/vasovagal.  He does have some risk factors with age, hypertension, hyperlipidemia with no recent cardiac workup.  Offered admission to patient for further cardiac evaluation of his syncope.  Shared  decision making with patient, he opted for outpatient workup with primary doctor.  Patient stable for discharge.  Strict return precautions. [TY]    Clinical Course User Index [TY] Coral Spikes, DO                                 Medical Decision Making 82 year old male presenting emergency department for a syncopal episode.  Past medical history per chart review of PCP visit shows patient with hypertension, hyperlipidemia, history of DVT/PE, pancreatic cyst and iliac artery aneurysm.  Patient's history with syncope with micturition would suggest vasovagal type episode.  He denies cardiac history, but it does not appear that he has had any recent provocative testing. ECG appears to be normal sinus rhythm without ST segment changes to get ischemia.  He has had reportedly decreased p.o. intake and URI symptoms.  Will get broad screening labs and chest x-ray.    Amount and/or Complexity of Data Reviewed Labs: ordered. Decision-making details documented in ED Course. Radiology: ordered. ECG/medicine tests: ordered.  Risk OTC drugs.           Final Clinical Impression(s) / ED Diagnoses Final diagnoses:  Syncope and collapse    Rx / DC Orders ED Discharge Orders     None         Coral Spikes, DO 11/09/22 1622

## 2022-11-09 NOTE — ED Triage Notes (Addendum)
Patient brought in by EMS from [facility] for syncopal episode. Per EMS patient was ambulating to bathroom became weak, dizzy and nauseated and lowered himself to the floor. Patient voiced LOC, denies thinners. He also c/o cough, chills, body aches, decreased appetite and output. HX of diabetes. EMS started 18g in L AC and gave 400cc of NS.  140/90 90 97% RA CBG: 264

## 2022-11-09 NOTE — Discharge Instructions (Addendum)
Please follow-up with your primary doctor.  Turn immediately develop fevers, chills, sudden onset headache, chest pain, palpitations, lightheadedness, passout, have a seizure or any new or worsening symptoms that are concerning to you.

## 2022-11-09 NOTE — ED Notes (Signed)
[facility] called for f/u states patient is next to be transported

## 2022-11-09 NOTE — ED Notes (Signed)
RN called patient wife with update and D/C plan, clear understanding voiced by patient's wife. RN called [employer] and Data processing manager. [facility] called for transportation.

## 2022-11-09 NOTE — ED Notes (Addendum)
RN 2nd attempt to call [employer], Data processing manager. Food and nutrition given to patient

## 2022-11-09 NOTE — ED Notes (Signed)
Patient given dinner tray and fluids

## 2022-11-09 NOTE — ED Notes (Signed)
[facility] arrived to transport patient back to [facility]

## 2022-11-10 ENCOUNTER — Ambulatory Visit: Payer: Medicare Other | Admitting: Nurse Practitioner

## 2022-11-11 LAB — URINE CULTURE
Culture: 40000 — AB
Special Requests: NORMAL

## 2022-11-12 ENCOUNTER — Telehealth (HOSPITAL_BASED_OUTPATIENT_CLINIC_OR_DEPARTMENT_OTHER): Payer: Self-pay

## 2022-11-12 NOTE — Telephone Encounter (Signed)
Post ED Visit - Positive Culture Follow-up  Culture report reviewed by antimicrobial stewardship pharmacist: [facility] []  Enzo Bi, Pharm.D. []  Celedonio Miyamoto, Pharm.D., BCPS AQ-ID []  Garvin Fila, Pharm.D., BCPS []  Georgina Pillion, Pharm.D., BCPS []  [place], [address].D., BCPS, AAHIVP []  Estella Husk, Pharm.D., BCPS, AAHIVP []  Lysle Pearl, PharmD, BCPS []  Phillips Climes, PharmD, BCPS []  Agapito Games, PharmD, BCPS []  Verlan Friends, PharmD []  Mervyn Gay, PharmD, BCPS []  Vinnie Level, PharmD  [facility] [x]  Ulla Gallo, PharmD []  Greer Pickerel, PharmD []  Adalberto Cole, PharmD []  Perlie Gold, Rph []  Lonell Face) Jean Rosenthal, PharmD []  Earl Many, PharmD []  Junita Push, PharmD []  Dorna Leitz, PharmD []  Terrilee Files, PharmD []  Lynann Beaver, PharmD []  Keturah Barre, PharmD []  Loralee Pacas, PharmD []  Bernadene Person, PharmD   Positive urine culture No treated and no treatment needed and no further patient follow-up is required at this time.  Sandria Senter 11/12/2022, 11:42 AM

## 2022-12-02 ENCOUNTER — Encounter: Payer: Self-pay | Admitting: Gastroenterology

## 2022-12-02 ENCOUNTER — Ambulatory Visit: Payer: Medicare Other | Admitting: *Deleted

## 2022-12-02 VITALS — Ht 70.0 in | Wt 188.0 lb

## 2022-12-02 DIAGNOSIS — K862 Cyst of pancreas: Secondary | ICD-10-CM

## 2022-12-02 DIAGNOSIS — R933 Abnormal findings on diagnostic imaging of other parts of digestive tract: Secondary | ICD-10-CM

## 2022-12-02 NOTE — Progress Notes (Signed)
Pt's name and DOB verified at the beginning of the pre-visit.  Pt denies any difficulty with ambulating,sitting, laying down or rolling side to side Gave both [facility] main # and MD on call # prior to instructions.  No egg or soy allergy known to patient  No issues known to pt with past sedation with any surgeries or procedures Pt denies having issues being intubated Pt has no issues moving head neck or swallowing No FH of Malignant Hyperthermia Pt is not on diet pills Pt is not on home 02  Pt is not on blood thinners  Pt denies issues with constipation  Pt is not on dialysis Pt denise any abnormal heart rhythms  Pt denies any upcoming cardiac testing Pt encouraged to use to use Singlecare or Goodrx to reduce cost  Patient's chart reviewed by Cathlyn Parsons CNRA prior to pre-visit and patient appropriate for the [facility].  Pre-visit completed and red dot placed by patient's name on their procedure day (on provider's schedule).  . Visit by phone Pt states weight is 188 lb Instructed pt why it is important to and  to call if they have any changes in health or new medications. Directed them to the # given and on instructions.   Pt states they will.  Instructions reviewed with pt and pt states understanding. Instructed to review again prior to procedure. Pt states they will.  Instructions sent by mail with coupon and by my chart Lost his wife of 56 years on 11/26/22. Tearful provided emotional support

## 2022-12-08 NOTE — Progress Notes (Addendum)
PCP - Dr. Marlowe [place] Cardiologist - no  PPM/ICD -  Device Orders -  Rep Notified -   Chest x-ray - 11-11-22 EKG - 11-11-22 Stress Test -  ECHO -  Cardiac Cath -   Sleep Study -  CPAP -   Ozempic- 12-04-22 last dose  Fasting Blood Sugar - 128 200 Checks Blood Sugar __1___ times a day  Blood Thinner Instructions: Aspirin Instructions:  COVID vaccine -  Had COVID in 10-2022  Activity--Able to complete Adl's without CP or SOB Anesthesia review:   Patient denies shortness of breath, fever, cough and chest pain at PAT appointment   All instructions explained to the patient, with a verbal understanding of the material. Patient agrees to go over the instructions while at home for a better understanding. Patient also instructed to self quarantine after being tested for COVID-19. The opportunity to ask questions was provided.

## 2022-12-09 ENCOUNTER — Other Ambulatory Visit: Payer: Self-pay

## 2022-12-09 ENCOUNTER — Encounter (HOSPITAL_COMMUNITY): Payer: Self-pay | Admitting: Gastroenterology

## 2022-12-09 ENCOUNTER — Telehealth: Payer: Self-pay | Admitting: Gastroenterology

## 2022-12-09 NOTE — Telephone Encounter (Signed)
PT has an EUS scheduled for 9/25. He has been coughing and has a lot of phlegm and wants to know will he still be able to proceed with EUS

## 2022-12-09 NOTE — Telephone Encounter (Signed)
The pt has an EUS on 9/25 and states he has had a cough for several weeks and wanted to make sure he could proceed with the case as planned.  He is afebrile, no chills and the mucous/phlegm is clear.  He says he does believe he is better. He is going to monitor and if he is worse or develops other symptoms he will call back on Monday.

## 2022-12-13 NOTE — Progress Notes (Signed)
Patient phoned back to advise that he spoke to Dr. Elesa Hacker office and they said okay to proceed with EUS.

## 2022-12-14 ENCOUNTER — Ambulatory Visit (HOSPITAL_COMMUNITY): Payer: Medicare Other | Admitting: Certified Registered Nurse Anesthetist

## 2022-12-14 ENCOUNTER — Encounter (HOSPITAL_COMMUNITY): Payer: Self-pay | Admitting: Gastroenterology

## 2022-12-14 ENCOUNTER — Encounter (HOSPITAL_COMMUNITY)
Admission: RE | Disposition: A | Discharge status: Home / Self Care | Payer: Self-pay | Source: Home / Self Care | Attending: Gastroenterology

## 2022-12-14 ENCOUNTER — Ambulatory Visit (HOSPITAL_COMMUNITY)
Admission: RE | Admit: 2022-12-14 | Discharge: 2022-12-14 | Disposition: A | Discharge status: Home / Self Care | Payer: Medicare Other | Attending: Gastroenterology | Admitting: Gastroenterology

## 2022-12-14 ENCOUNTER — Other Ambulatory Visit: Payer: Self-pay

## 2022-12-14 DIAGNOSIS — K8689 Other specified diseases of pancreas: Secondary | ICD-10-CM

## 2022-12-14 DIAGNOSIS — K862 Cyst of pancreas: Secondary | ICD-10-CM | POA: Insufficient documentation

## 2022-12-14 DIAGNOSIS — Z87891 Personal history of nicotine dependence: Secondary | ICD-10-CM | POA: Insufficient documentation

## 2022-12-14 DIAGNOSIS — Z86718 Personal history of other venous thrombosis and embolism: Secondary | ICD-10-CM | POA: Diagnosis not present

## 2022-12-14 DIAGNOSIS — Z86711 Personal history of pulmonary embolism: Secondary | ICD-10-CM | POA: Diagnosis not present

## 2022-12-14 DIAGNOSIS — Z87442 Personal history of urinary calculi: Secondary | ICD-10-CM | POA: Insufficient documentation

## 2022-12-14 DIAGNOSIS — I129 Hypertensive chronic kidney disease with stage 1 through stage 4 chronic kidney disease, or unspecified chronic kidney disease: Secondary | ICD-10-CM | POA: Insufficient documentation

## 2022-12-14 DIAGNOSIS — K449 Diaphragmatic hernia without obstruction or gangrene: Secondary | ICD-10-CM | POA: Diagnosis not present

## 2022-12-14 DIAGNOSIS — K2289 Other specified disease of esophagus: Secondary | ICD-10-CM | POA: Diagnosis not present

## 2022-12-14 DIAGNOSIS — N183 Chronic kidney disease, stage 3 unspecified: Secondary | ICD-10-CM | POA: Insufficient documentation

## 2022-12-14 DIAGNOSIS — K2970 Gastritis, unspecified, without bleeding: Secondary | ICD-10-CM

## 2022-12-14 DIAGNOSIS — K3189 Other diseases of stomach and duodenum: Secondary | ICD-10-CM | POA: Insufficient documentation

## 2022-12-14 DIAGNOSIS — K208 Other esophagitis without bleeding: Secondary | ICD-10-CM | POA: Diagnosis not present

## 2022-12-14 DIAGNOSIS — K219 Gastro-esophageal reflux disease without esophagitis: Secondary | ICD-10-CM | POA: Diagnosis not present

## 2022-12-14 DIAGNOSIS — Z7984 Long term (current) use of oral hypoglycemic drugs: Secondary | ICD-10-CM | POA: Diagnosis not present

## 2022-12-14 DIAGNOSIS — K8020 Calculus of gallbladder without cholecystitis without obstruction: Secondary | ICD-10-CM | POA: Insufficient documentation

## 2022-12-14 DIAGNOSIS — E1122 Type 2 diabetes mellitus with diabetic chronic kidney disease: Secondary | ICD-10-CM | POA: Insufficient documentation

## 2022-12-14 DIAGNOSIS — R933 Abnormal findings on diagnostic imaging of other parts of digestive tract: Secondary | ICD-10-CM | POA: Insufficient documentation

## 2022-12-14 HISTORY — PX: ESOPHAGOGASTRODUODENOSCOPY (EGD) WITH PROPOFOL: SHX5813

## 2022-12-14 HISTORY — PX: BIOPSY: SHX5522

## 2022-12-14 HISTORY — PX: UPPER ESOPHAGEAL ENDOSCOPIC ULTRASOUND (EUS): SHX6562

## 2022-12-14 HISTORY — DX: Anxiety disorder, unspecified: F41.9

## 2022-12-14 LAB — GLUCOSE, CAPILLARY: Glucose-Capillary: 208 mg/dL — ABNORMAL HIGH (ref 70–99)

## 2022-12-14 SURGERY — UPPER ESOPHAGEAL ENDOSCOPIC ULTRASOUND (EUS)
Anesthesia: Monitor Anesthesia Care

## 2022-12-14 MED ORDER — LACTATED RINGERS IV SOLN: INTRAVENOUS | Status: DC

## 2022-12-14 MED ORDER — SODIUM CHLORIDE 0.9 % IV SOLN: INTRAVENOUS | Status: DC

## 2022-12-14 MED ORDER — PHENYLEPHRINE 80 MCG/ML (10ML) SYRINGE FOR IV PUSH (FOR BLOOD PRESSURE SUPPORT)
PREFILLED_SYRINGE | INTRAVENOUS | Status: DC | PRN
  Administered 2022-12-14: 80 ug via INTRAVENOUS

## 2022-12-14 MED ORDER — CIPROFLOXACIN IN D5W 400 MG/200ML IV SOLN
INTRAVENOUS | Status: AC
  Filled 2022-12-14: qty 200

## 2022-12-14 MED ORDER — PROPOFOL 500 MG/50ML IV EMUL
INTRAVENOUS | Status: DC | PRN
  Administered 2022-12-14: 125 ug/kg/min via INTRAVENOUS

## 2022-12-14 MED ORDER — PROPOFOL 10 MG/ML IV BOLUS
INTRAVENOUS | Status: DC | PRN
  Administered 2022-12-14 (×3): 20 mg via INTRAVENOUS

## 2022-12-14 MED ORDER — LIDOCAINE 2% (20 MG/ML) 5 ML SYRINGE
INTRAMUSCULAR | Status: DC | PRN
  Administered 2022-12-14: 60 mg via INTRAVENOUS

## 2022-12-14 NOTE — Transfer of Care (Addendum)
Immediate Anesthesia Transfer of Care Note  Patient: Joshua Peterson  Procedure(s) Performed: UPPER ESOPHAGEAL ENDOSCOPIC ULTRASOUND (EUS) BIOPSY  Patient Location: Endoscopy Unit  Anesthesia Type:MAC  Level of Consciousness: awake and alert   Airway & Oxygen Therapy: Patient Spontanous Breathing and Patient connected to face mask oxygen  Post-op Assessment: Report given to RN and Post -op Vital signs reviewed and stable  Post vital signs: Reviewed and stable  Last Vitals:  Vitals Value Taken Time  BP 90/65 12/14/22 0946  Temp    Pulse 76 12/14/22 0947  Resp 18 12/14/22 0947  SpO2 99 % 12/14/22 0947  Vitals shown include unfiled device data.  Last Pain: There were no vitals filed for this visit.       Complications: No notable events documented.

## 2022-12-14 NOTE — Discharge Instructions (Signed)
YOU HAD AN ENDOSCOPIC PROCEDURE TODAY: Refer to the procedure report and other information in the discharge instructions given to you for any specific questions about what was found during the examination. If this information does not answer your questions, please call [facility] at [phone] to clarify.   YOU SHOULD EXPECT: Some feelings of bloating in the abdomen. Passage of more gas than usual. Walking can help get rid of the air that was put into your GI tract during the procedure and reduce the bloating. If you had a lower endoscopy (such as a colonoscopy or flexible sigmoidoscopy) you may notice spotting of blood in your stool or on the toilet paper. Some abdominal soreness may be present for a day or two, also.  DIET: Your first meal following the procedure should be a light meal and then it is ok to progress to your normal diet. A half-sandwich or bowl of soup is an example of a good first meal. Heavy or fried foods are harder to digest and may make you feel nauseous or bloated. Drink plenty of fluids but you should avoid alcoholic beverages for 24 hours. If you had an esophageal dilation, please see attached information for diet.   ACTIVITY: Your care partner should take you home directly after the procedure. You should plan to take it easy, moving slowly for the rest of the day. You can resume normal activity the day after the procedure however YOU SHOULD NOT DRIVE, use power tools, machinery or perform tasks that involve climbing or major physical exertion for 24 hours (because of the sedation medicines used during the test).   SYMPTOMS TO REPORT IMMEDIATELY: A gastroenterologist can be reached at any hour. Please call [phone]  for any of the following symptoms:  Following lower endoscopy (colonoscopy, flexible sigmoidoscopy) Excessive amounts of blood in the stool  Significant tenderness, worsening of abdominal pains  Swelling of the abdomen that is new, acute  Fever of  100 or higher  Following upper endoscopy (EGD, EUS, ERCP, esophageal dilation) Vomiting of blood or coffee ground material  New, significant abdominal pain  New, significant chest pain or pain under the shoulder blades  Painful or persistently difficult swallowing  New shortness of breath  Black, tarry-looking or red, bloody stools  FOLLOW UP:  If any biopsies were taken you will be contacted by phone or by letter within the next 1-3 weeks. Call [phone]  if you have not heard about the biopsies in 3 weeks.  Please also call with any specific questions about appointments or follow up tests.

## 2022-12-14 NOTE — H&P (Signed)
GASTROENTEROLOGY PROCEDURE H&P NOTE   Primary Care Physician: Lucky Cowboy, MD  HPI: Joshua Peterson is a 82 y.o. male who presents for EGD/EUS to evaluate pancreatic cyst and dilated pancreatic duct.  Past Medical History:  Diagnosis Date   Allergic rhinitis    Anxiety    Bladder stones    CKD (chronic kidney disease), stage III (HCC)    Common iliac aneurysm (HCC) right side   followed by vascular-- dr Myra Gianotti (note in epic)   Environmental and seasonal allergies    External carotid artery stenosis     per last duplex 05-18-2011  right moderate stenosis and left moderate to severe   GERD (gastroesophageal reflux disease)    1950's and 60's   Hematuria    History of adenomatous polyp of colon    tubular adenoma's   History of bladder stone    History of DVT in adulthood    08/ 2009 right lower extremity;  2013  left lower extremity  (10-11-2018 per pt no dvt since 2013)   History of kidney stones    History of pulmonary embolism    bilatera lung  08/ 2009  (10-11-2018  per pt no pe since 2009)   History of wheezing    allergies--- prn inhaler   Hypertension    Hypogonadism male    Internal carotid artery stenosis, bilateral     per last duplex 05-18-2011 in epic,   bilateral 40-59% stenosis  (10-11-2018  per pt denies stroke/tia S&S)   Lower urinary tract symptoms (LUTS)    Mixed hyperlipidemia    Nephrolithiasis    Testosterone deficiency    Type 2 diabetes mellitus (HCC)    Vitamin D deficiency    Wears glasses    Past Surgical History:  Procedure Laterality Date   COLONOSCOPY  last one 10-25-2017   CYSTOSCOPY WITH LITHOLAPAXY N/A 07/18/2016   Procedure: CYSTOSCOPY WITH LITHOLAPAXY;  Surgeon: Ihor Gully, MD;  Location: [facility];  Service: Urology;  Laterality: N/A;   CYSTOSCOPY WITH LITHOLAPAXY N/A 10/15/2018   Procedure: CYSTOSCOPY WITH LITHOLAPAXY;  Surgeon: Ihor Gully, MD;  Location: [facility];  Service: Urology;   Laterality: N/A;   IVC FILTER INSERTION  11/21/2007   IVC FILTER REMOVAL  01/31/2008   ORCHIOPEXY Bilateral 1950's   undescended testis's   RIGHT CHEEK/MOUTH BIOPSY  09/16/2008  (office)   squamous hyperplasia w/ hyperkeratosis   RIGHT DEEP CERVICAL NODE BIOPSY  01/03/2001   right posterior neck (per path report-- lymphoid hyperplasia w/ florid follicular hyperplasia, no malignancy(   TONSILLECTOMY  age 54   Current Facility-Administered Medications  Medication Dose Route Frequency Provider Last Rate Last Admin   0.9 %  sodium chloride infusion   Intravenous Continuous Mansouraty, Netty Starring., MD       lactated ringers infusion   Intravenous Continuous Mansouraty, Netty Starring., MD 10 mL/hr at 12/14/22 0757 New Bag at 12/14/22 0757    Current Facility-Administered Medications:    0.9 %  sodium chloride infusion, , Intravenous, Continuous, Mansouraty, Netty Starring., MD   lactated ringers infusion, , Intravenous, Continuous, Mansouraty, Netty Starring., MD, Last Rate: 10 mL/hr at 12/14/22 0757, New Bag at 12/14/22 0757 Allergies  Allergen Reactions   Penicillins Itching   Doxycycline     unknown   Niacin And Related Other (See Comments)    flushing   Sulfa Antibiotics Itching   Family History  Problem Relation Age of Onset   Alzheimer's disease Mother  Diabetes Mother    Prostate cancer Father    Breast cancer Sister    Colon cancer Neg Hx    Rectal cancer Neg Hx    Stomach cancer Neg Hx    Esophageal cancer Neg Hx    Colon polyps Neg Hx    Social History   Socioeconomic History   Marital status: Married    Spouse name: Kendal Hymen   Number of children: 0   Years of education: Not on file   Highest education level: Not on file  Occupational History   Occupation: retired  Tobacco Use   Smoking status: Former    Current packs/day: 0.00    Average packs/day: 1 pack/day for 20.0 years (20.0 ttl pk-yrs)    Types: Cigarettes, Cigars    Start date: 04/16/1971    Quit date:  04/16/1991    Years since quitting: 31.6   Smokeless tobacco: Never  Vaping Use   Vaping status: Never Used  Substance and Sexual Activity   Alcohol use: Yes    Comment: ocassional   1 drink every 6 weeks   Drug use: Never   Sexual activity: Not Currently  Other Topics Concern   Not on file  Social History Narrative   Not on file   Social Determinants of Health   Financial Resource Strain: Not on file  Food Insecurity: Not on file  Transportation Needs: Not on file  Physical Activity: Not on file  Stress: Not on file  Social Connections: Not on file  Intimate Partner Violence: Not on file    Physical Exam: There were no vitals filed for this visit. There is no height or weight on file to calculate BMI. GEN: NAD EYE: Sclerae anicteric ENT: MMM CV: Non-tachycardic GI: Soft, NT/ND NEURO:  Alert & Oriented x 3  Lab Results: No results for input(s): "WBC", "HGB", "HCT", "PLT" in the last 72 hours. BMET No results for input(s): "NA", "K", "CL", "CO2", "GLUCOSE", "BUN", "CREATININE", "CALCIUM" in the last 72 hours. LFT No results for input(s): "PROT", "ALBUMIN", "AST", "ALT", "ALKPHOS", "BILITOT", "BILIDIR", "IBILI" in the last 72 hours. PT/INR No results for input(s): "LABPROT", "INR" in the last 72 hours.   Impression / Plan: This is a 82 y.o.male who presents for EGD/EUS to evaluate pancreatic cyst and dilated pancreatic duct.  The risks of an EUS including intestinal perforation, bleeding, infection, aspiration, and medication effects were discussed as was the possibility it may not give a definitive diagnosis if a biopsy is performed.  When a biopsy of the pancreas is done as part of the EUS, there is an additional risk of pancreatitis at the rate of about 1-2%.  It was explained that procedure related pancreatitis is typically mild, although it can be severe and even life threatening, which is why we do not perform random pancreatic biopsies and only biopsy a  lesion/area we feel is concerning enough to warrant the risk.   The risks and benefits of endoscopic evaluation/treatment were discussed with the patient and/or family; these include but are not limited to the risk of perforation, infection, bleeding, missed lesions, lack of diagnosis, severe illness requiring hospitalization, as well as anesthesia and sedation related illnesses.  The patient's history has been reviewed, patient examined, no change in status, and deemed stable for procedure.  The patient and/or family is agreeable to proceed.    Corliss Parish, MD [facility] Office # [phone]

## 2022-12-14 NOTE — Anesthesia Preprocedure Evaluation (Addendum)
Anesthesia Evaluation  Patient identified by MRN, date of birth, ID band Patient awake    Reviewed: Allergy & Precautions, H&P , NPO status , Patient's Chart, lab work & pertinent test results  Airway Mallampati: III  TM Distance: >3 FB Neck ROM: Full    Dental no notable dental hx. (+) Teeth Intact, Dental Advisory Given   Pulmonary former smoker   Pulmonary exam normal breath sounds clear to auscultation       Cardiovascular hypertension, On Medications  Rhythm:Regular Rate:Normal     Neuro/Psych   Anxiety Depression    negative neurological ROS     GI/Hepatic Neg liver ROS,GERD  Medicated,,  Endo/Other  diabetes, Type 2, Oral Hypoglycemic Agents    Renal/GU Renal InsufficiencyRenal disease  negative genitourinary   Musculoskeletal   Abdominal   Peds  Hematology negative hematology ROS (+)   Anesthesia Other Findings   Reproductive/Obstetrics negative OB ROS                             Anesthesia Physical Anesthesia Plan  ASA: 3  Anesthesia Plan: MAC   Post-op Pain Management: Minimal or no pain anticipated   Induction: Intravenous  PONV Risk Score and Plan: 2 and Propofol infusion and Treatment may vary due to age or medical condition  Airway Management Planned: Natural Airway and Simple Face Mask  Additional Equipment:   Intra-op Plan:   Post-operative Plan:   Informed Consent: I have reviewed the patients History and Physical, chart, labs and discussed the procedure including the risks, benefits and alternatives for the proposed anesthesia with the patient or authorized representative who has indicated his/her understanding and acceptance.     Dental advisory given  Plan Discussed with: CRNA  Anesthesia Plan Comments:        Anesthesia Quick Evaluation

## 2022-12-14 NOTE — Anesthesia Postprocedure Evaluation (Signed)
Anesthesia Post Note  Patient: Joshua Peterson  Procedure(s) Performed: UPPER ESOPHAGEAL ENDOSCOPIC ULTRASOUND (EUS) BIOPSY     Patient location during evaluation: PACU Anesthesia Type: MAC Level of consciousness: awake and alert Pain management: pain level controlled Vital Signs Assessment: post-procedure vital signs reviewed and stable Respiratory status: spontaneous breathing, nonlabored ventilation and respiratory function stable Cardiovascular status: blood pressure returned to baseline Postop Assessment: no apparent nausea or vomiting Anesthetic complications: no   No notable events documented.  Last Vitals:  Vitals:   12/14/22 1000 12/14/22 1005  BP: 115/69 122/70  Pulse: 73 71  Resp: 16 (!) 24  Temp:    SpO2: 96% 97%    Last Pain:  Vitals:   12/14/22 1005  PainSc: 0-No pain                 Shanda Howells

## 2022-12-14 NOTE — Op Note (Signed)
[facility] Patient Name: Joshua Peterson Procedure Date: 12/14/2022 MRN: [id number] Attending MD: Corliss Parish , MD, [id number] Date of Birth: [birth year] CSN: [id number] Age: 82 Admit Type: Outpatient Procedure:                Upper EUS Indications:              Pancreatic cyst on MRCP, Dilated pancreatic duct on                            MRCP Providers:                Corliss Parish, MD, Stephens Shire RN, RN, Adin Hector, RN, Kandice Robinsons, Technician Referring MD:             Willaim Rayas. Adela Lank, MD, Lucky Cowboy Medicines:                Monitored Anesthesia Care Complications:            No immediate complications. Estimated Blood Loss:     Estimated blood loss was minimal. Procedure:                Pre-Anesthesia Assessment:                           - Prior to the procedure, a History and Physical                            was performed, and patient medications and                            allergies were reviewed. The patient's tolerance of                            previous anesthesia was also reviewed. The risks                            and benefits of the procedure and the sedation                            options and risks were discussed with the patient.                            All questions were answered, and informed consent                            was obtained. Prior Anticoagulants: The patient has                            taken no anticoagulant or antiplatelet agents                            except for aspirin. ASA Grade Assessment: III - A  patient with severe systemic disease. After                            reviewing the risks and benefits, the patient was                            deemed in satisfactory condition to undergo the                            procedure.                           After obtaining informed consent, the endoscope was                             passed under direct vision. Throughout the                            procedure, the patient's blood pressure, pulse, and                            oxygen saturations were monitored continuously. The                            [id number]-[id number] ([id number]) Olympus endoscope was introduced                            through the mouth, and advanced to the second part                            of duodenum. The [id number]-[id number] ([id number]) Olympus                            duodenoscope was introduced through the mouth, and                            advanced to the area of papilla. The [id number]                            ([id number]) Olympus linear ultrasound scope was                            introduced through the mouth, and advanced to the                            duodenum for ultrasound examination from the                            stomach and duodenum. The upper EUS was                            accomplished without difficulty. The patient  tolerated the procedure. Scope In: Scope Out: Findings:      ENDOSCOPIC FINDING: :      No gross lesions were noted in the entire esophagus.      The Z-line was irregular and was found 43 cm from the incisors.      A 1 cm hiatal hernia was present.      Striped mildly erythematous mucosa without bleeding was found in the       entire examined stomach. Biopsies were taken with a cold forceps for       histology and Helicobacter pylori testing.      No gross lesions were noted in the duodenal bulb, in the first portion       of the duodenum and in the second portion of the duodenum.      Congested mucosa was found at the major papilla. No evidence of an overt       fishmouth deformity was found.      ENDOSONOGRAPHIC FINDING: :      The pancreatic duct had a dilated endosonographic appearance throughout,       had multiple intraductal stones within the head and neck and body and       tail (head stone measured 9 mm, neck stone measured  8 mm, body stone       measured 3.8 mm, tail stone measured 3.6 mm), had an irregularly       contoured endosonographic appearance, had a prominently branched       endosonographic appearance, had a tortuous/ectatic appearance and had       hyperechoic walls. The pancreatic duct in the head measured up to 9.5       mm, the pancreatic duct in the genu measured up to 10.8 mm, the       pancreatic duct in the body of the pancreas measured up to 4.0 mm and       pancreatic duct in the tail measured up to 3.4 mm.      An anechoic lesion suggestive of a cyst was identified in the uncinate       process of the pancreas. It is not in obvious communication with the       pancreatic duct. The lesion measured 12 mm by 10 mm in maximal       cross-sectional diameter. There was a single compartment without septae.       The outer wall of the lesion was not seen. There was no associated mass.       There was no internal debris within the fluid-filled cavity.      An anechoic lesion suggestive of a cyst was identified in the pancreatic       tail. It communicates with the pancreatic duct. The lesion measured 7 mm       by 8 mm in maximal cross-sectional diameter. There was a single       compartment without septae. The outer wall of the lesion was not seen.       There was no associated mass. There was no internal debris within the       fluid-filled cavity.      Pancreatic parenchymal abnormalities were noted in the entire pancreas.       These consisted of atrophy, lobularity without honeycombing and       hyperechoic strands.      Endosonographic imaging of the ampulla showed no intramural       (subepithelial) lesion.  There was no sign of significant endosonographic abnormality in the       common bile duct and in the common hepatic duct.      Multiple stones were visualized endosonographically in the gallbladder.       The stones were round. They were hyperechoic and characterized by        shadowing.      Endosonographic imaging in the visualized portion of the liver showed no       mass.      No malignant-appearing lymph nodes were visualized in the celiac region       (level 20), peripancreatic region and porta hepatis region.      The celiac region was visualized. Impression:               EGD impression:                           - No gross lesions in the entire esophagus. Z-line                            irregular, 43 cm from the incisors.                           - 1 cm hiatal hernia.                           - Erythematous mucosa in the stomach. Biopsied.                           - No gross lesions in the duodenal bulb, in the                            first portion of the duodenum and in the second                            portion of the duodenum.                           - Congested major papilla. No fishmouth deformity                            noted.                           EUS impression:                           - The pancreatic duct had a dilated endosonographic                            appearance, had intraductal stones, had an                            irregularly contoured endosonographic appearance,                            had a prominently branched endosonographic  appearance, had a tortuous/ectatic appearance and                            had hyperechoic walls in the pancreatic head, genu                            of the pancreas, body of the pancreas and tail of                            the pancreas.                           - A cystic lesion was seen in the uncinate process                            of the pancreas. Tissue has not been obtained.                            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                           - A cystic lesion was seen in the pancreatic tail.                             Tissue has not been obtained. 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                           - Pancreatic parenchymal abnormalities consisting                            of atrophy, lobularity and hyperechoic strands were                            noted in the entire pancreas.                           - There was no sign of significant pathology in the                            common bile duct and in the common hepatic duct.                           -  Multiple stones were visualized                            endosonographically in the gallbladder.                           - No malignant-appearing lymph nodes were                            visualized in the celiac region (level 20),                            peripancreatic region and porta hepatis region. Moderate Sedation:      Not Applicable - Patient had care per Anesthesia. Recommendation:           - The patient will be observed post-procedure,                            until all discharge criteria are met.                           - Discharge patient to home.                           - Patient has a contact number available for                            emergencies. The signs and symptoms of potential                            delayed complications were discussed with the                            patient. Return to normal activities tomorrow.                            Written discharge instructions were provided to the                            patient.                           - Low fat diet.                           - Monitor for signs/symptoms of bleeding,                            perforation, and infection. If issues please call                            our number to get further assistance as needed.                           - Await path results.                            -  Repeat imaging study in 6 months with repeat CA                            19?"9, if patient does not have evidence of                            progressive abdominal pain or episodes of                            pancreatitis (could consider sooner follow-up                            otherwise). I will discuss with patient's primary                            gastroenterologist.                           - Endoscopic therapy for the stones is unlikely to                            be successful. I am not sure if pancreatic ERCP is                            needed at this time.                           - The findings and recommendations were discussed                            with the patient.                           - The findings and recommendations were discussed                            with the designated responsible adult. Procedure Code(s):        --- Professional ---                           [phone], Esophagogastroduodenoscopy, flexible,                            transoral; with endoscopic ultrasound examination                            limited to the esophagus, stomach or duodenum, and                            adjacent structures                           43239, Esophagogastroduodenoscopy, flexible,                            transoral; with biopsy, single or  multiple Diagnosis Code(s):        --- Professional ---                           K22.89, Other specified disease of esophagus                           K44.9, Diaphragmatic hernia without obstruction or                            gangrene                           K31.89, Other diseases of stomach and duodenum                           K86.89, Other specified diseases of pancreas                           R93.3, Abnormal findings on diagnostic imaging of                            other parts of digestive tract                           K86.2, Cyst of pancreas                            K86.9, Disease of pancreas, unspecified                           K80.20, Calculus of gallbladder without                            cholecystitis without obstruction                           I89.9, Noninfective disorder of lymphatic vessels                            and lymph nodes, unspecified CPT copyright 2022 [employer]. All rights reserved. The codes documented in this report are preliminary and upon coder review may  be revised to meet current compliance requirements. Corliss Parish, MD 12/14/2022 9:53:35 AM Number of Addenda: 0

## 2022-12-15 LAB — SURGICAL PATHOLOGY

## 2022-12-16 ENCOUNTER — Encounter: Payer: Self-pay | Admitting: Gastroenterology

## 2022-12-18 ENCOUNTER — Encounter (HOSPITAL_COMMUNITY): Payer: Self-pay | Admitting: Gastroenterology

## 2022-12-19 ENCOUNTER — Telehealth: Payer: Self-pay | Admitting: Gastroenterology

## 2022-12-19 ENCOUNTER — Ambulatory Visit (INDEPENDENT_AMBULATORY_CARE_PROVIDER_SITE_OTHER): Payer: Medicare Other | Admitting: Nurse Practitioner

## 2022-12-19 ENCOUNTER — Encounter: Payer: Self-pay | Admitting: Nurse Practitioner

## 2022-12-19 VITALS — BP 126/64 | HR 71 | Temp 98.4°F | Ht 70.0 in | Wt 187.2 lb

## 2022-12-19 DIAGNOSIS — H10011 Acute follicular conjunctivitis, right eye: Secondary | ICD-10-CM

## 2022-12-19 DIAGNOSIS — H00022 Hordeolum internum right lower eyelid: Secondary | ICD-10-CM | POA: Diagnosis not present

## 2022-12-19 MED ORDER — ERYTHROMYCIN 5 MG/GM OP OINT
TOPICAL_OINTMENT | OPHTHALMIC | 0 refills | Status: DC
Start: 2022-12-19 — End: 2023-02-13

## 2022-12-19 NOTE — Progress Notes (Unsigned)
Assessment and Plan:  Joshua Peterson was seen today for an episodic visit.  Diagnoses and all order for this visit:  Hordeolum internum of right lower eyelid Apply warm compress several times throughout the day to help open and drain the hordeolum.  Acute follicular conjunctivitis of right eye Start tmt with erythromycin as directed. Continue to monitor for increase in spreading of infection    - erythromycin ophthalmic ointment; Apply 1 cm ribbon in right eye TID x 7 days.  Dispense: 1 g; Refill: 0  Notify office for further evaluation and treatment, questions or concerns if s/s fail to improve. The risks and benefits of my recommendations, as well as other treatment options were discussed with the patient today. Questions were answered.  Further disposition pending results of labs. Discussed med's effects and SE's.    Over 15 minutes of exam, counseling, chart review, and critical decision making was performed.   Future Appointments  Date Time Provider Department Center  02/13/2023  2:00 PM Lucky Cowboy, MD [facility] None  05/17/2023  2:00 PM Pervis Macintyre, Archie Patten, NP [facility] None    ------------------------------------------------------------------------------------------------------------------   HPI BP 126/64   Pulse 71   Temp 98.4 F (36.9 C)   Ht 5\' 10"  (1.778 m)   Wt 187 lb 3.2 oz (84.9 kg)   SpO2 99%   BMI 26.86 kg/m    Patient presents for evaluation of discharge, foreign body sensation, itching, and tearing in the right eye. He has noticed the above symptoms for 1 week.  Onset was gradual. Patient denies blurred vision, photophobia, and visual field deficit. There is a history of allergies.  He has also noted a large bump on the lower eye lid.  He denies trauma, fall, injury.  He has not tried to manipulate the area.    Past Medical History:  Diagnosis Date   Allergic rhinitis    Anxiety    Bladder stones    CKD (chronic kidney disease), stage III (HCC)     Common iliac aneurysm (HCC) right side   followed by vascular-- dr Myra Gianotti (note in epic)   Environmental and seasonal allergies    External carotid artery stenosis     per last duplex 05-18-2011  right moderate stenosis and left moderate to severe   GERD (gastroesophageal reflux disease)    1950's and 60's   Hematuria    History of adenomatous polyp of colon    tubular adenoma's   History of bladder stone    History of DVT in adulthood    08/ 2009 right lower extremity;  2013  left lower extremity  (10-11-2018 per pt no dvt since 2013)   History of kidney stones    History of pulmonary embolism    bilatera lung  08/ 2009  (10-11-2018  per pt no pe since 2009)   History of wheezing    allergies--- prn inhaler   Hypertension    Hypogonadism male    Internal carotid artery stenosis, bilateral     per last duplex 05-18-2011 in epic,   bilateral 40-59% stenosis  (10-11-2018  per pt denies stroke/tia S&S)   Lower urinary tract symptoms (LUTS)    Mixed hyperlipidemia    Nephrolithiasis    Testosterone deficiency    Type 2 diabetes mellitus (HCC)    Vitamin D deficiency    Wears glasses      Allergies  Allergen Reactions   Penicillins Itching   Doxycycline     unknown   Niacin And Related  Other (See Comments)    flushing   Sulfa Antibiotics Itching    Current Outpatient Medications on File Prior to Visit  Medication Sig   aspirin 81 MG tablet Take 81 mg by mouth daily.   busPIRone (BUSPAR) 5 MG tablet Take     1/2 to 1 tablet     3 x /day      for Anxiety   Cholecalciferol (VITAMIN D3) 5000 units CAPS Take 2 capsules (10,000 Units total) by mouth daily. (Patient taking differently: Take 2 capsules by mouth daily.)   Chromium-Cinnamon (CINNAMON PLUS CHROMIUM) [phone] MCG-MG CAPS Take 2 capsules by mouth daily.   famotidine (PEPCID) 40 MG tablet Take 40 mg by mouth as needed.   fenofibrate (TRICOR) 145 MG tablet TAKE 1 TABLET DAILY FOR TRIGLYCERIDES (BLOOD FATS)   finasteride  (PROSCAR) 5 MG tablet Take  1 tablet  Daily  for Prostate   fluticasone (FLONASE) 50 MCG/ACT nasal spray SPRAY 2 SPRAYS INTO EACH NOSTRIL EVERY DAY   glipiZIDE (GLUCOTROL) 10 MG tablet TAKE 1 TABLET BY MOUTH 3 TIMES A DAY WITH MEALS FOR DIABETES   losartan (COZAAR) 25 MG tablet TAKE 1 TABLET DAILY FOR BLOOD PRESSURE GOAL <140/80 AND KIDNEY PROTECTION.   metFORMIN (GLUCOPHAGE-XR) 500 MG 24 hr tablet TAKE 2 TABLETS TWICE A DAY WITH MEALS FOR DIABETES   methylcellulose (CITRUCEL) oral powder Take 1 packet by mouth daily.   Multiple Vitamins-Minerals (MEGA MULTIVITAMIN FOR MEN) TABS Take 1 tablet by mouth daily.   ONETOUCH ULTRA test strip TEST BLOOD SUGAR ONCE DAILY FOR DIABETES   rosuvastatin (CRESTOR) 5 MG tablet TAKE 1 TABLET BY MOUTH EVERY DAY FOR CHOLESTEROL   Semaglutide, 2 MG/DOSE, (OZEMPIC, 2 MG/DOSE,) 8 MG/3ML SOPN Inject  2 mg  into skin  weekly  for Diabetes   tamsulosin (FLOMAX) 0.4 MG CAPS capsule Take  1 tablet  at Bedtime  for Prostate   No current facility-administered medications on file prior to visit.    ROS: all negative except what is noted in the HPI.   Physical Exam:  BP 126/64   Pulse 71   Temp 98.4 F (36.9 C)   Ht 5\' 10"  (1.778 m)   Wt 187 lb 3.2 oz (84.9 kg)   SpO2 99%   BMI 26.86 kg/m   General Appearance: NAD.  Awake, conversant and cooperative. Eyes: Left lower eye lid with mild erythema and small round firm painful  nodule.  PERRLA, EOMs intact.  Sclera white.  Conjunctiva with mild erythema. Sinuses: No frontal/maxillary tenderness.  No nasal discharge. Nares patent.  ENT/Mouth: Ext aud canals clear.  Bilateral TMs w/DOL and without erythema or bulging. Hearing intact.  Posterior pharynx without swelling or exudate.  Tonsils without swelling or erythema.  Neck: Supple.  No masses, nodules or thyromegaly. Respiratory: Effort is regular with non-labored breathing. Breath sounds are equal bilaterally without rales, rhonchi, wheezing or stridor.  Cardio:  RRR with no MRGs. Brisk peripheral pulses without edema.  Abdomen: Active BS in all four quadrants.  Soft and non-tender without guarding, rebound tenderness, hernias or masses. Lymphatics: Non tender without lymphadenopathy.  Musculoskeletal: Full ROM, 5/5 strength, normal ambulation.  No clubbing or cyanosis. Skin: Appropriate color for ethnicity. Warm without rashes, lesions, ecchymosis, ulcers.  Neuro: CN II-XII grossly normal. Normal muscle tone without cerebellar symptoms and intact sensation.   Psych: AO X 3,  appropriate mood and affect, insight and judgment.     Adela Glimpse, NP 4:55 PM [facility]

## 2022-12-19 NOTE — Telephone Encounter (Signed)
Inbound call from patient, would like someone to discuss results from EGD with him, states he does not understand, is requesting a call tomorrow due to him having an appointment this afternoon.

## 2022-12-20 NOTE — Telephone Encounter (Signed)
The pt would like to know if his EUS has any bearing on his insulin levels or production.  He is going to call his endocrinologist vs PCP to discuss his diabetes. He is trying to get his disability reevaluated.  The pt has been advised of the information and verbalized understanding.

## 2022-12-21 ENCOUNTER — Encounter: Payer: Self-pay | Admitting: Nurse Practitioner

## 2022-12-21 NOTE — Patient Instructions (Signed)
Chalazion  A chalazion is a swelling or lump on the eyelid. It can affect the upper eyelid or the lower eyelid. What are the causes? This condition may be caused by: Long-lasting (chronic) inflammation of the eyelid glands. A blocked oil gland in the eyelid. What are the signs or symptoms? Symptoms of this condition include: Swelling of the eyelid that: May spread to areas around the eye. May be painful. A hard lump on the eyelid. Blurry vision. The lump may make it hard to see out of the eye. How is this diagnosed? This condition is diagnosed with an examination of the eye. How is this treated? This condition is treated by applying a warm, moist cloth (warm compress) to the eyelid. If the condition does not improve, it may be treated with: Medicine that is applied to the eye. Oral medicines. Medicine that is injected into the chalazion. Surgery. Follow these instructions at home: Managing pain and swelling Apply a warm compress to the eyelid for 10-15 minutes, 4 to 6 times a day. This will help to open any blocked glands and to reduce redness and swelling. Take and apply over-the-counter and prescription medicines only as told by your health care provider. General instructions Do not touch the chalazion. Do not try to remove the pus. Do not squeeze the chalazion or stick it with a pin or needle. Do not rub your eyes. Wash your hands often with soap and water for at least 20 seconds. Dry your hands with a clean towel. Keep your face, scalp, and eyebrows clean. Avoid wearing eye makeup. Keep all follow-up visits. This is important. Contact a health care provider if: Your eyelid is getting worse. You have a fever. The chalazion does not break open (rupture) or go away on its own and your eyelid has not improved for 4 weeks. Get help right away if: You have pain in your eye. Your vision worsens. The chalazion becomes painful or red. The chalazion gets bigger. Summary A  chalazion is a swelling or lump on the upper or lower eyelid. It may be caused by chronic inflammation or a blocked oil gland. Apply a warm compress to the eyelid for 10-15 minutes, 4 to 6 times a day. Keep your face, scalp, and eyebrows clean. This information is not intended to replace advice given to you by your health care provider. Make sure you discuss any questions you have with your health care provider. Document Revised: 05/13/2020 Document Reviewed: 05/13/2020 Elsevier Patient Education  2024 [employer].

## 2023-01-14 ENCOUNTER — Other Ambulatory Visit: Payer: Self-pay | Admitting: Internal Medicine

## 2023-01-14 DIAGNOSIS — N401 Enlarged prostate with lower urinary tract symptoms: Secondary | ICD-10-CM

## 2023-01-15 ENCOUNTER — Other Ambulatory Visit: Payer: Self-pay | Admitting: Nurse Practitioner

## 2023-01-18 ENCOUNTER — Encounter: Payer: Medicare Other | Admitting: Internal Medicine

## 2023-01-29 ENCOUNTER — Other Ambulatory Visit: Payer: Self-pay | Admitting: Nurse Practitioner

## 2023-02-09 ENCOUNTER — Other Ambulatory Visit: Payer: Self-pay | Admitting: Surgery

## 2023-02-09 DIAGNOSIS — D490 Neoplasm of unspecified behavior of digestive system: Secondary | ICD-10-CM

## 2023-02-11 ENCOUNTER — Other Ambulatory Visit: Payer: Self-pay | Admitting: Internal Medicine

## 2023-02-11 DIAGNOSIS — E1122 Type 2 diabetes mellitus with diabetic chronic kidney disease: Secondary | ICD-10-CM

## 2023-02-12 ENCOUNTER — Encounter: Payer: Self-pay | Admitting: Internal Medicine

## 2023-02-12 NOTE — Patient Instructions (Signed)

## 2023-02-12 NOTE — Progress Notes (Signed)
[facility]  Lucky Cowboy, M.D.          Rance Muir, A.NP        Adela Glimpse, F.NP  [facility] [address]  [place], [place]. [address] Telephone [phone] Telefax [phone]  Annual  Screening/Preventative Visit  & Comprehensive Evaluation & Examination   Future Appointments  Date Time Provider Department  02/13/2023                        cpe  2:00 PM Lucky Cowboy, MD [facility]  05/17/2023                       wellness             2:00 PM Adela Glimpse, NP [facility]  03/05/2024                         cpe  2:00 PM Lucky Cowboy, MD [facility]             This very nice 82 y.o.  WWM  (widowed 11/26/2022) with HTN, HLD, T2_NIDDM  and Vitamin D Deficiency presents for a Screening /Preventative Visit & comprehensive evaluation and management of multiple medical co-morbidities.  Patient has hx/o Gout controlled on his meds. CT scan on 2018 showed Aortic Atherosclerosis .   Patient has hx/o major Depressive Disorder currently in remission . In Sept 2024, patient had EGD/EUS by Dr Meridee Score to evaluate pancreatic cyst(s) .        HTN predates  since 2004. Patient's BP has been controlled at home.  Today's BP was at goal - 110/70. Patient denies any cardiac symptoms as chest pain, palpitations, shortness of breath, dizziness or ankle swelling.        Patient's hyperlipidemia is controlled with diet and Rosuvastatin/fenofibrate . Patient denies myalgias or other medication SE's. 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  Patient admits poor dietary compliance. Patient denies reactive hypoglycemic symptoms, visual blurring, diabetic polys or paresthesias. Last A1c was not at goal :   Lab Results  Component Value Date   HGBA1C 7.2 (H) 08/09/2022         Finally, patient has history of Vitamin D Deficiency ("24" /2008) and patient admits poor med compliance . Last vitamin D was not at goal (70-100) :   Lab Results  Component Value Date   VD25OH 41 08/09/2022       Current Outpatient Medications  Medication Instructions   Aspirin  81 mg Daily   busPIRone (BUSPAR) 5 MG tablet Take  1/2 to 1 tablet     3 x /day      for Anxiety   Chromium-Cinnamon [phone] mcg-mg 2 capsules  Daily   famotidine  40 mg, Oral, As needed   fenofibrate  145 MG tablet TAKE 1 TABLET DAILY  finasteride  5 MG tablet Take  1 tablet  Daily     FLONASE nasal spray 2 SPRAYS INTO EACH NOSTRIL EVERY DAY   glipiZIDE 10 MG tablet TAKE 1 TABLET  3 TIMES A DAY WITH MEALS FOR DIABETES   Losartan 25 MG tablet TAKE 1 T   metFORMIN-XR 500 MG  TAKE 2 TABLETS TWICE A DAY    CITRUCEL oral powder 1 packet   Daily   Multiple Vitamins-Minerals  1 tablet Daily   rosuvastatin 5 MG tablet TAKE 1 TABLET  EVERY DAY    Semaglutide, 2 MG/DOSE Inject  2 mg  into skin  weekly     Tamsulosin 0.4 MG CAPS  TAKE 1 CAPSULE AT BEDTIME FOR PROSTATE   Vitamin D    10,000 Units ,Daily     Allergies  Allergen Reactions   Doxycycline    Niacin And Related  flushing     Sulfa Antibiotics Itching     Past Medical History:  Diagnosis Date   Allergic rhinitis    Bladder stones    CKD (chronic kidney disease), stage III (HCC)    Common iliac aneurysm (HCC) right side   followed by vascular-- dr Myra Gianotti (note in epic)   Environmental and seasonal allergies    External carotid artery stenosis     per last duplex 05-18-2011  right moderate stenosis and left moderate to severe   Hematuria    History of adenomatous polyp  of colon    tubular adenoma's   History of bladder stone    History of DVT in adulthood    08/ 2009 right lower extremity;  2013  left lower extremity  (10-11-2018 per pt no dvt since 2013)   History of kidney stones    History of pulmonary embolism    bilatera lung  08/ 2009  (10-11-2018  per pt no pe since 2009)   History of wheezing    allergies--- prn inhaler   Hypertension    Hypogonadism male    Internal carotid artery stenosis, bilateral     per last duplex 05-18-2011 in epic,   bilateral 40-59% stenosis  (10-11-2018  per pt denies stroke/tia S&S)   Lower urinary tract symptoms (LUTS)    Mixed hyperlipidemia    Nephrolithiasis    Testosterone deficiency    Type 2 diabetes mellitus (HCC)    Vitamin D deficiency    Wears glasses      Health Maintenance  Topic Date Due   Zoster Vaccines- Shingrix (1 of 2) Never done   Pneumonia Vaccine 3+ Years old (2 - PPSV23 if available, else PCV20) 01/14/2016   OPHTHALMOLOGY EXAM  06/26/2020   COLONOSCOPY  10/25/2020   FOOT EXAM  12/14/2020   HEMOGLOBIN A1C  01/08/2021   TETANUS/TDAP  12/30/2026   INFLUENZA VACCINE  Completed   COVID-19 Vaccine  Completed   HPV VACCINES  Aged Out     Immunization History  Administered Date(s) Administered   Fluad Quad(high Dose) 11/23/2018   Influenza, High Dose  11/26/2015, 12/29/2016, 12/18/2017   Moderna Sars-Covid-2 Vacc 04/04/2019, 05/02/2019   Pneumococcal - 13 01/14/2015   Pneumococcal - 23 04/02/2008   Td 04/02/2006, 12/29/2016   Zoster, Live 04/03/2007    Last Colon - 10/25/2017 - Dr Adela Lank - Recc 3 yr f/u  ->   Overdue Aug 2022 & now aged out  12/14/2022 - EGD / EUS - Dr Meridee Score for Pancreatic cyst - Negative path.    Past Surgical History:  Procedure Laterality Date   COLONOSCOPY  last one 10-25-2017   CYSTOSCOPY WITH LITHOLAPAXY N/A 07/18/2016   Procedure:  CYSTOSCOPY WITH LITHOLAPAXY;  Surgeon: Ihor Gully, MD;  Location[facility];  Service: Urology;  Laterality: N/A;   CYSTOSCOPY WITH LITHOLAPAXY N/A 10/15/2018   Procedure: CYSTOSCOPY WITH LITHOLAPAXY;  Surgeon: Ihor Gully, MD;  Location: [facility];  Service: Urology;  Laterality: N/A;   IVC FILTER INSERTION  11/21/2007   IVC FILTER REMOVAL  01/31/2008   ORCHIOPEXY Bilateral 1950's   undescended testis's   RIGHT CHEEK/MOUTH BIOPSY  09/16/2008  (office)   squamous hyperplasia w/ hyperkeratosis   RIGHT DEEP CERVICAL NODE BIOPSY  01/03/2001   right posterior neck ( lymphoid hyperplasia w/ florid follicular hyperplasia, no malignancy   TONSILLECTOMY  age 15     Family History  Problem Relation Age of Onset   Alzheimer's disease Mother    Diabetes Mother    Prostate cancer Father    Breast cancer Sister    Colon cancer Neg Hx    Rectal cancer Neg Hx    Stomach cancer Neg Hx    Esophageal cancer Neg Hx      Social History   Tobacco Use   Smoking status: Former    Packs/day: 1.00    Years: 20.00    Pack years: 20.00    Types: Cigarettes    Quit date: 04/16/1991    Years since quitting: 29.7   Smokeless tobacco: Never  Substance Use Topics   Alcohol use: Yes    Comment: ocassional    Drug use: Never      ROS Constitutional: Denies fever, chills, weight loss/gain, headaches, insomnia,  night sweats or change in appetite. Does c/o fatigue. Eyes: Denies redness, blurred vision, diplopia, discharge, itchy or watery eyes.  ENT: Denies discharge, congestion, post nasal drip, epistaxis, sore throat, earache, hearing loss, dental pain, Tinnitus, Vertigo, Sinus pain or snoring.  Cardio: Denies chest pain, palpitations, irregular heartbeat, syncope, dyspnea, diaphoresis, orthopnea, PND, claudication or edema Respiratory: denies cough, dyspnea, DOE, pleurisy, hoarseness, laryngitis or wheezing.  Gastrointestinal: Denies dysphagia, heartburn, reflux,  water brash, pain, cramps, nausea, vomiting, bloating, diarrhea, constipation, hematemesis, melena, hematochezia, jaundice or hemorrhoids Genitourinary: Denies dysuria, frequency, discharge, hematuria or flank pain. Has urgency, nocturia x 1-3 & occasional hesitancy. Musculoskeletal: Denies arthralgia, myalgia, stiffness, Jt. Swelling, pain, limp or strain/sprain. Denies Falls. Skin: Denies puritis, rash, hives, warts, acne, eczema or change in skin lesion Neuro: No weakness, tremor, incoordination, spasms, paresthesia or pain Psychiatric: Denies confusion, memory loss or sensory loss. Denies Depression. Endocrine: Denies change in weight, skin, hair change, nocturia, and paresthesia, diabetic polys, visual blurring or hyper / hypo glycemic episodes.  Heme/Lymph: No excessive bleeding, bruising or enlarged lymph nodes.   Physical Exam  BP 110/70   Pulse 79   Temp 98.1 F (36.7 C)   Resp 16   Ht 5\' 10"  (1.778 m)   Wt 187 lb 12.8 oz (85.2 kg)   SpO2 99%   BMI 26.95 kg/m   General Appearance: Well nourished and well groomed and in no apparent distress.  Eyes: PERRLA, EOMs, conjunctiva no swelling or erythema, normal fundi and vessels. Sinuses: No frontal/maxillary tenderness ENT/Mouth: EACs patent / TMs  nl. Nares clear without erythema, swelling, mucoid exudates. Oral hygiene is good. No erythema, swelling, or exudate. Tongue normal, non-obstructing. Tonsils not  swollen or erythematous. Hearing normal.  Neck: Supple, thyroid not palpable. No bruits, nodes or JVD. Respiratory: Respiratory effort normal.  BS equal and clear bilateral without rales, rhonci, wheezing or stridor. Cardio: Heart sounds are normal with regular rate and rhythm and no murmurs, rubs or gallops. Peripheral pulses are normal and equal bilaterally without edema. No aortic or femoral bruits. Chest: symmetric with normal excursions and percussion.  Abdomen: Soft, with Nl bowel sounds. Nontender, no guarding, rebound,  hernias, masses, or organomegaly.  Lymphatics: Non tender without lymphadenopathy.  Musculoskeletal: Full ROM all peripheral extremities, joint stability, 5/5 strength, and normal gait. Skin: Warm and dry without rashes, lesions, cyanosis, clubbing or  ecchymosis.  Neuro: Cranial nerves intact, reflexes equal bilaterally. Normal muscle tone, no cerebellar symptoms. Sensation intact to touch, but decreased to vibratory and Monofilament in a stocking distribution to the toes bilaterally. Pysch: Alert and oriented x 3 with normal affect, insight and judgment appropriate.   Assessment and Plan  1. Annual Preventative/Screening Exam    2. Essential hypertension  - EKG 12-Lead - [place], RETROPERITNL ABD,  LTD - Urinalysis, Routine w reflex microscopic - Microalbumin / creatinine urine ratio - CBC with Differential/Platelet - COMPLETE METABOLIC PANEL WITH GFR - Magnesium - TSH   3. Hyperlipidemia associated with type 2 diabetes mellitus (HCC)  - EKG 12-Lead - Lipid panel - TSH   4. Type 2 diabetes mellitus with stage 3a chronic kidney                                          disease, without long-term current use of insulin (HCC)  - EKG 12-Lead - [place], RETROPERITNL ABD,  LTD - Urinalysis, Routine w reflex microscopic - Microalbumin / creatinine urine ratio - PTH, intact and calcium - Hemoglobin A1c - Insulin, random   5. Diabetic polyneuropathy associated with type 2 diabetes mellitus (HCC)  - EKG 12-Lead - [place], RETROPERITNL ABD,  LTD - HM DIABETES FOOT EXAM - PR LOW EXTEMITY NEUR EXAM DOCUM - Hemoglobin A1c   6. Vitamin D deficiency  - VITAMIN D 25 Hydroxy    7. Idiopathic gout  - Uric acid   8. Aortic atherosclerosis (HCC) by CT scan 2018  - EKG 12-Lead - [place], RETROPERITNL ABD,  LTD - Lipid panel   9. Recurrent major depressive disorder, in partial remission (HCC)  - TSH   10. BPH with obstruction/lower urinary tract symptoms  - PSA   11. Prostate  cancer screening  - PSA  12. Screening for colorectal cancer  - hemoccult   13. Screening for heart disease  - EKG 12-Lead   14. Former smoker  - EKG 12-Lead - [place], RETROPERITNL ABD,  LTD   15. Screening for AAA (aortic abdominal aneurysm)  - [place], RETROPERITNL ABD,  LTD   16. Medication management - Urinalysis, Routine w reflex microscopic - Microalbumin / creatinine urine ratio - CBC with Differential/Platelet - COMPLETE METABOLIC PANEL WITH GFR - Magnesium - Lipid panel - TSH - Hemoglobin A1c - Insulin, random - VITAMIN D 25 Hydroxy           Patient was counseled in prudent diet, weight control to achieve/maintain BMI less than 25, BP monitoring, regular exercise and medications as discussed.  Discussed med effects and SE's. Routine screening labs and tests as requested with regular follow-up as recommended. Over 40 minutes of exam, counseling, chart review  and high complex critical decision making was performed   Marinus Maw, MD

## 2023-02-13 ENCOUNTER — Encounter: Payer: Self-pay | Admitting: Internal Medicine

## 2023-02-13 ENCOUNTER — Ambulatory Visit (INDEPENDENT_AMBULATORY_CARE_PROVIDER_SITE_OTHER): Payer: Medicare Other | Admitting: Internal Medicine

## 2023-02-13 VITALS — BP 110/70 | HR 79 | Temp 98.1°F | Resp 16 | Ht 70.0 in | Wt 187.8 lb

## 2023-02-13 DIAGNOSIS — Z125 Encounter for screening for malignant neoplasm of prostate: Secondary | ICD-10-CM

## 2023-02-13 DIAGNOSIS — Z0000 Encounter for general adult medical examination without abnormal findings: Secondary | ICD-10-CM | POA: Diagnosis not present

## 2023-02-13 DIAGNOSIS — N1832 Chronic kidney disease, stage 3b: Secondary | ICD-10-CM

## 2023-02-13 DIAGNOSIS — I10 Essential (primary) hypertension: Secondary | ICD-10-CM

## 2023-02-13 DIAGNOSIS — Z136 Encounter for screening for cardiovascular disorders: Secondary | ICD-10-CM

## 2023-02-13 DIAGNOSIS — I700 Atherosclerosis of aorta: Secondary | ICD-10-CM | POA: Diagnosis not present

## 2023-02-13 DIAGNOSIS — Z1211 Encounter for screening for malignant neoplasm of colon: Secondary | ICD-10-CM

## 2023-02-13 DIAGNOSIS — Z87891 Personal history of nicotine dependence: Secondary | ICD-10-CM

## 2023-02-13 DIAGNOSIS — M1000 Idiopathic gout, unspecified site: Secondary | ICD-10-CM

## 2023-02-13 DIAGNOSIS — Z0001 Encounter for general adult medical examination with abnormal findings: Secondary | ICD-10-CM

## 2023-02-13 DIAGNOSIS — F3341 Major depressive disorder, recurrent, in partial remission: Secondary | ICD-10-CM

## 2023-02-13 DIAGNOSIS — E1169 Type 2 diabetes mellitus with other specified complication: Secondary | ICD-10-CM

## 2023-02-13 DIAGNOSIS — E1142 Type 2 diabetes mellitus with diabetic polyneuropathy: Secondary | ICD-10-CM

## 2023-02-13 DIAGNOSIS — E559 Vitamin D deficiency, unspecified: Secondary | ICD-10-CM

## 2023-02-13 DIAGNOSIS — N138 Other obstructive and reflux uropathy: Secondary | ICD-10-CM

## 2023-02-13 DIAGNOSIS — Z79899 Other long term (current) drug therapy: Secondary | ICD-10-CM

## 2023-02-14 LAB — CBC WITH DIFFERENTIAL/PLATELET
Absolute Lymphocytes: 1834 {cells}/uL (ref 850–3900)
Absolute Monocytes: 273 {cells}/uL (ref 200–950)
Basophils Absolute: 49 {cells}/uL (ref 0–200)
Basophils Relative: 0.7 %
Eosinophils Absolute: 161 {cells}/uL (ref 15–500)
Eosinophils Relative: 2.3 %
HCT: 39.4 % (ref 38.5–50.0)
Hemoglobin: 13.1 g/dL — ABNORMAL LOW (ref 13.2–17.1)
MCH: 31.1 pg (ref 27.0–33.0)
MCHC: 33.2 g/dL (ref 32.0–36.0)
MCV: 93.6 fL (ref 80.0–100.0)
MPV: 9.9 fL (ref 7.5–12.5)
Monocytes Relative: 3.9 %
Neutro Abs: 4683 {cells}/uL (ref 1500–7800)
Neutrophils Relative %: 66.9 %
Platelets: 149 10*3/uL (ref 140–400)
RBC: 4.21 10*6/uL (ref 4.20–5.80)
RDW: 12.8 % (ref 11.0–15.0)
Total Lymphocyte: 26.2 %
WBC: 7 10*3/uL (ref 3.8–10.8)

## 2023-02-14 LAB — COMPLETE METABOLIC PANEL WITH GFR
AG Ratio: 1.7 (calc) (ref 1.0–2.5)
ALT: 11 U/L (ref 9–46)
AST: 11 U/L (ref 10–35)
Albumin: 4.3 g/dL (ref 3.6–5.1)
Alkaline phosphatase (APISO): 57 U/L (ref 35–144)
BUN/Creatinine Ratio: 21 (calc) (ref 6–22)
BUN: 33 mg/dL — ABNORMAL HIGH (ref 7–25)
CO2: 23 mmol/L (ref 20–32)
Calcium: 10 mg/dL (ref 8.6–10.3)
Chloride: 109 mmol/L (ref 98–110)
Creat: 1.55 mg/dL — ABNORMAL HIGH (ref 0.70–1.22)
Globulin: 2.5 g/dL (ref 1.9–3.7)
Glucose, Bld: 191 mg/dL — ABNORMAL HIGH (ref 65–99)
Potassium: 4.5 mmol/L (ref 3.5–5.3)
Sodium: 141 mmol/L (ref 135–146)
Total Bilirubin: 0.6 mg/dL (ref 0.2–1.2)
Total Protein: 6.8 g/dL (ref 6.1–8.1)
eGFR: 44 mL/min/{1.73_m2} — ABNORMAL LOW (ref >=60)

## 2023-02-14 LAB — MICROALBUMIN / CREATININE URINE RATIO
Creatinine, Urine: 121 mg/dL (ref 20–320)
Microalb Creat Ratio: 5 mg/g{creat} (ref <30)
Microalb, Ur: 0.6 mg/dL

## 2023-02-14 LAB — URINALYSIS, ROUTINE W REFLEX MICROSCOPIC
Bilirubin Urine: NEGATIVE
Hgb urine dipstick: NEGATIVE
Ketones, ur: NEGATIVE
Leukocytes,Ua: NEGATIVE
Nitrite: NEGATIVE
Protein, ur: NEGATIVE
Specific Gravity, Urine: 1.022 (ref 1.001–1.035)
pH: <=5 (ref 5.0–8.0)

## 2023-02-14 LAB — MAGNESIUM: Magnesium: 1.7 mg/dL (ref 1.5–2.5)

## 2023-02-14 LAB — INSULIN, RANDOM: Insulin: 40.8 u[IU]/mL — ABNORMAL HIGH

## 2023-02-14 LAB — LIPID PANEL
Cholesterol: 88 mg/dL (ref <200)
HDL: 30 mg/dL — ABNORMAL LOW (ref >=40)
LDL Cholesterol (Calc): 36 mg/dL
Non-HDL Cholesterol (Calc): 58 mg/dL (ref <130)
Total CHOL/HDL Ratio: 2.9 (calc) (ref <5.0)
Triglycerides: 137 mg/dL (ref <150)

## 2023-02-14 LAB — HEMOGLOBIN A1C
Hgb A1c MFr Bld: 8.9 %{Hb} — ABNORMAL HIGH (ref <5.7)
Mean Plasma Glucose: 209 mg/dL
eAG (mmol/L): 11.6 mmol/L

## 2023-02-14 LAB — PARATHYROID HORMONE, INTACT (NO CA): PTH: 31 pg/mL (ref 16–77)

## 2023-02-14 LAB — VITAMIN D 25 HYDROXY (VIT D DEFICIENCY, FRACTURES): Vit D, 25-Hydroxy: 36 ng/mL (ref 30–100)

## 2023-02-14 LAB — URIC ACID: Uric Acid, Serum: 4.6 mg/dL (ref 4.0–8.0)

## 2023-02-14 LAB — PSA: PSA: 0.21 ng/mL (ref <=4.00)

## 2023-02-14 LAB — TSH: TSH: 1.57 m[IU]/L (ref 0.40–4.50)

## 2023-02-14 NOTE — Progress Notes (Signed)
[] [] [] [] [] [] [] [] [] [] [] [] [] [] [] [] [] [] [] [] [] [] [] [] [] [] [] [] [] [] [] [] [] [] [] [] [] [] [] [] [] ][] [] [] [] [] [] [] [] [] [] [] [] [] [] [] [] [] [] [] [] [] [] [[] [] [] [] []  [] [] [] [] [] [] [] [] [] [] [] [] [] [] [] [] [] [] [] [] [] [] [] [] [] [] [] [] [] [] [] [] [] [] [] [] [] [] [] [] [] ][] [] [] [] [] [] [] [] [] [] [] [] [] [] [] [] [] [] [] [] [] [] [[] [] [] [] []   - A1c = 8.9% - MUCH WORSE  ( was 6.8%  and 7.2% )   [] [] [] [] [] [] [] [] [] [] [] [] [] [] [] [] [] [] [] [] [] [] [] [] [] [] [] [] [] [] [] [] [] [] [] [] [] [] [] [] [] ][] [] [] [] [] [] [] [] [] [] [] [] [] [] [] [] [] [] [] [] [] [] [[] [] [] [] []   -   Kidney Functions Still Stage 3b - >                                           So heading toward a Dialysis Machine                                                              if don't get better with Diet & lose weight.   - Also need to cut your Metformin down to                                              1 tablet 2 x / day   ( Not 2 tablets )                                              since your Kidney functions are Bad !  [] [] [] [] [] [] [] [] [] [] [] [] [] [] [] [] [] [] [] [] [] [] [] [] [] [] [] [] [] [] [] [] [] [] [] [] [] [] [] [] [] ][] [] [] [] [] [] [] [] [] [] [] [] [] [] [] [] [] [] [] [] [] [] [[] [] [] [] []   -  Chol remains excellent at 88 - So                 Suggest you cut your Crestor / Rosuvastatin 5 mg down to                                 1/2 tablet  / Day   [] [] [] [] [] [] [] [] [] [] [] [] [] [] [] [] [] [] [] [] [] [] [] [] [] [] [] [] [] [] [] [] [] [] [] [] [] [] [] [] [] ][] [] [] [] [] [] [] [] [] [] [] [] [] [] [] [] [] [] [] [] [] [] [[] [] [] [] []   -  PSA is very low - Great - No signs of Prostate cancer   [] [] [] [] [] [] [] [] [] [] [] [] [] [] [] [] [] [] [] [] [] [] [] [] [] [] [] [] [] [] [] [] [] [] [] [] [] [] [] [] [] ][] [] [] [] [] [] [] [] [] [] [] [] [] [] [] [] [] [] [] [] [] [] [[] [] [] [] []   -  Uric acid  / Gout test is Normal - Please continue your Allopurinol   [] [] [] [] [] [] [] [] [] [] [] [] [] [] [] [] [] [] [] [] [] [] [] [] [] [] [] [] [] [] [] [] [] [] [] [] [] [] [] [] [] ][] [] [] [] [] [] [] [] [] [] [] [] [] [] [] [] [] [] [] [] [] [] [[] [] [] [] []   -  PTH is hormone that regulates calcium balance is Normal - Great    [] [] [] [] [] [] [] [] [] [] [] [] [] [] [] [] [] [] [] [] [] [] [] [] [] [] [] [] [] [] [] [] [] [] [] [] [] [] [] [] [] ][] [] [] [] [] [] [] [] [] [] [] [] [] [] [] [] [] [] [] [] [] [] [[] [] [] [] []   -  Magnesium = 1.7  is low - Recommend take                                  Magnesium 500 mg   tabs ,caps or gummies / daily   [] [] [] [] [] [] [] [] [] [] [] [] [] [] [] [] [] [] [] [] [] [] [] [] [] [] [] [] [] [] [] [] [] [] [] [] [] [] [] [] [] ][] [] [] [] [] [] [] [] [] [] [] [] [] [] [] [] [] [] [] [] [] [] [[] [] [] [] []   - Vitamin D = 36 is very low   - Vitamin D goal is between 70-100.   - Please make sure that you are                                       taking your Vitamin D 10,000 units /day as directed.   - It is very important as a natural anti-inflammatory and helping the                immune system protect against viral infections like Flu &  Covid-19    - helps hair, skin, and nails, as well as reducing stroke and heart attack risk.   - It  helps your bones and helps with mood.  - It also decreases numerous cancer risks so please                                                                                           take it as directed.   - Low Vit D is associated with a 200-300% higher risk for CANCER   and 200-300% higher risk for HEART   ATTACK  &  STROKE.    - It is also associated with higher death rate at younger ages,   autoimmune diseases like Rheumatoid arthritis, Lupus, Multiple Sclerosis.     - Also many other serious conditions, like depression,                                                                Alzheimer's Dementia,                                                                  muscle aches,                                                                  fatigue,                                                                   fibromyalgia    [] [] [] [] [] [] [] [] [] [] [] [] [] [] [] [] [] [] [] [] [] [] [] [] [] [] [] [] [] [] [] [] [] [] [] [] [] [] [] [] [] ][] [] [] [] [] [] [] [] [] [] [] [] [] [] [] [] [] [] [] [] [] [] [[] [] [] [] []  [] [] [] [] [] [] [] [] [] [] [] [] [] [] [] [] [] [] [] [] [] [] [] [] [] [] [] [] [] [] [] [] [] [] [] [] [] [] [] [] [] ][] [] [] [] [] [] [] [] [] [] [] [] [] [] [] [] [] [] [] [] [] [] [[] [] [] [] []

## 2023-03-07 ENCOUNTER — Inpatient Hospital Stay
Admission: RE | Admit: 2023-03-07 | Discharge: 2023-03-07 | Disposition: A | Discharge status: Home / Self Care | Payer: Medicare Other | Source: Ambulatory Visit | Attending: Surgery

## 2023-03-07 DIAGNOSIS — D490 Neoplasm of unspecified behavior of digestive system: Secondary | ICD-10-CM

## 2023-03-07 MED ORDER — GADOPICLENOL 0.5 MMOL/ML IV SOLN
10.0000 mL | Freq: Once | INTRAVENOUS | Status: AC | PRN
  Administered 2023-03-07: 10 mL via INTRAVENOUS

## 2023-04-17 ENCOUNTER — Encounter: Payer: Self-pay | Admitting: Gastroenterology

## 2023-04-25 ENCOUNTER — Telehealth: Payer: Self-pay

## 2023-04-25 DIAGNOSIS — R933 Abnormal findings on diagnostic imaging of other parts of digestive tract: Secondary | ICD-10-CM

## 2023-04-25 DIAGNOSIS — K862 Cyst of pancreas: Secondary | ICD-10-CM

## 2023-04-25 NOTE — Telephone Encounter (Signed)
Jan no CT is needed, just had an MRCP with Dr. Freida Busman. She is recommending a repeat MRCP in 3 months and will be ordering that for there patient. He is due for lab draw for CA 19-9 though if you can coordinate. Thanks

## 2023-04-25 NOTE — Telephone Encounter (Signed)
Dr. Adela Lank, this patient had MRCP in December.  I see you wanted him to have CT pancreas and CA-19-9 this month. Do you still want CT at this time or just CA 19-9? Thank you

## 2023-04-25 NOTE — Telephone Encounter (Signed)
-----   Message from [facility] sent at 12/16/2022  9:32 AM EDT ----- Regarding: CT pancreas and CA 19-9 due Feb Per SA:   pancreatic protocol CT abdomen in 4-6 months (February=5 months)  and CA 19-9

## 2023-04-25 NOTE — Telephone Encounter (Signed)
Order placed for CA 19-9. Called patient and LM to go to the lab

## 2023-05-04 ENCOUNTER — Other Ambulatory Visit: Payer: Self-pay

## 2023-05-04 ENCOUNTER — Other Ambulatory Visit: Payer: Medicare Other

## 2023-05-04 DIAGNOSIS — R933 Abnormal findings on diagnostic imaging of other parts of digestive tract: Secondary | ICD-10-CM

## 2023-05-04 DIAGNOSIS — K862 Cyst of pancreas: Secondary | ICD-10-CM

## 2023-05-05 LAB — CANCER ANTIGEN 19-9: CA 19-9: 20 U/mL (ref <34)

## 2023-05-17 ENCOUNTER — Ambulatory Visit: Payer: Medicare Other | Admitting: Nurse Practitioner

## 2023-05-25 ENCOUNTER — Encounter: Payer: Self-pay | Admitting: *Deleted

## 2023-06-05 ENCOUNTER — Other Ambulatory Visit: Payer: Self-pay | Admitting: Surgery

## 2023-06-05 DIAGNOSIS — D490 Neoplasm of unspecified behavior of digestive system: Secondary | ICD-10-CM

## 2023-06-27 ENCOUNTER — Ambulatory Visit
Admission: RE | Admit: 2023-06-27 | Discharge: 2023-06-27 | Disposition: A | Discharge status: Home / Self Care | Source: Ambulatory Visit | Attending: Surgery | Admitting: Surgery

## 2023-06-27 DIAGNOSIS — D490 Neoplasm of unspecified behavior of digestive system: Secondary | ICD-10-CM

## 2023-06-27 MED ORDER — GADOPICLENOL 0.5 MMOL/ML IV SOLN
9.0000 mL | Freq: Once | INTRAVENOUS | Status: AC | PRN
  Administered 2023-06-27: 9 mL via INTRAVENOUS

## 2023-07-28 ENCOUNTER — Ambulatory Visit: Payer: Medicare Other | Admitting: Gastroenterology

## 2023-07-28 ENCOUNTER — Encounter: Payer: Self-pay | Admitting: Gastroenterology

## 2023-07-28 VITALS — BP 118/82 | HR 80 | Ht 70.0 in | Wt 200.4 lb

## 2023-07-28 DIAGNOSIS — D490 Neoplasm of unspecified behavior of digestive system: Secondary | ICD-10-CM

## 2023-07-28 DIAGNOSIS — D136 Benign neoplasm of pancreas: Secondary | ICD-10-CM

## 2023-07-28 DIAGNOSIS — Z860100 Personal history of colon polyps, unspecified: Secondary | ICD-10-CM | POA: Diagnosis not present

## 2023-07-28 NOTE — Progress Notes (Signed)
 HPI :  83 year old male here for a follow-up visit for pancreatic cyst and history of colon polyps.  Recall he has been found to have a pancreatic cyst since end of 2021, early 2022.  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  There are changes of pancreatic atrophy and a dilated pancreatic duct with imaging over time in 2023 2024.  Ultimately this led to an EUS with Dr. Brice Campi in September 2024, changes were grossly concerning for IPMN.  He was referred to Dr. Leighton Punches of surgery, she has performed 2 MRCP's since that time, one in December and then again here in April which shows stable changes over time.  He feels well without complaints.  His weight is stable.  He has occasional "twinges" of abdominal pain at times but this is very rare.  He has a bowel movement usually once a day to twice a day, can often be soft although denies overt stearrhea.  We discussed if he wanted to have testing for pancreatic fecal elastase or consideration of pancreatic enzymes.  His wife has passed away within the past year.  He is really not interested in surgery for this and has preferred surveillance.  We otherwise reviewed his last colonoscopy from 2019, he had multiple polyps removed in his colon which were adenomas.  At his age he is declining further surveillance.  He is due to return 83 years old in a few weeks    Colonoscopy 10/25/2017: The perianal and digital rectal examinations were                            normal.                           The terminal ileum appeared normal.                           Two sessile polyps were found in the cecum. The                            polyps were 3 to 4 mm in size. These polyps were                            removed with a cold snare. Resection and retrieval                            were complete.                           Four sessile polyps were found in the ascending                            colon. The polyps were 3 to 5 mm in  size. These                            polyps were removed with a cold snare. Resection                            and retrieval were complete.  Three sessile polyps were found in the sigmoid                            colon. The polyps were 3 to 4 mm in size. These                            polyps were removed with a cold snare. Resection                            and retrieval were complete.                           Internal hemorrhoids were found during retroflexion.                           The exam was otherwise without abnormality.   Surgical [P], ascending and cecum, sigmoid, polyp (9) - TUBULAR ADENOMA(S). - NEGATIVE FOR HIGH GRADE DYSPLASIA OR MALIGNANCY.     MRCP 05/24/21: IMPRESSION: 1. Evidence of chronic pancreatitis with severe pancreatic atrophy and a tortuous dilated main pancreatic duct which is mildly increased in diameter since previous study. 2. Numerous cystic foci again seen throughout the pancreas as described which are mildly enlarged since previous study. 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 No suspicious enhancement visualized. Recommend follow-up MRI in 12 months. 3. Cholelithiasis. 4. Hepatic steatosis. 5. Right renal atrophy.     MRCP 07/09/22: IMPRESSION: 1. Examination is significantly limited by breath motion artifact. 2. Within this limitation, severely atrophic pancreas, with diffuse dilatation of the pancreatic duct along its length, measuring up to 1.1 cm in caliber, increased compared to prior examination. 3. Numerous small cystic lesions are scattered throughout the pancreas, with a dominant lesion of the pancreatic uncinate measuring 2.3 x 1.6 cm. No solid component or suspicious contrast enhancement. 4. Notably, pancreatic ductal dilatation and parenchymal atrophy have markedly progressed over a relatively short interval dating back to 04/25/2020, and findings are concerning for main  duct intraductal papillary mucinous neoplasm. Recommend consideration of EUS/FNA. 5. Cholelithiasis. 6. Hepatic steatosis.    CA 19-9 - 09/20/22 - 34  CA 19-9 - 05/04/23 - 20   EUS 12/14/22: EGD impression: - No gross lesions in the entire esophagus. Z-line irregular, 43 cm from the incisors. - 1 cm hiatal hernia. - Erythematous mucosa in the stomach. Biopsied. - No gross lesions in the duodenal bulb, in the first portion of the duodenum and in the second portion of the duodenum. - Congested major papilla. No fishmouth deformity noted.   EUS impression: - The pancreatic duct had a dilated endosonographic appearance, had intraductal stones, had an irregularly contoured endosonographic appearance, had a prominently branched endosonographic appearance, had a tortuous/ectatic appearance and had hyperechoic walls in the pancreatic head, genu of the pancreas, body of the pancreas and tail of the pancreas. - A cystic lesion was seen in the uncinate process of the pancreas. Tissue has not been obtained. However, the endosonographic appearance is suggestive of an intraductal papillary mucinous neoplasm. - A cystic lesion was seen in the pancreatic tail. Tissue has not been obtained. However, the endosonographic appearance is suggestive of a branched intraductal papillary mucinous neoplasm. - Pancreatic parenchymal abnormalities consisting of atrophy, lobularity and hyperechoic strands were noted in the entire pancreas. -  There was no sign of significant pathology in the common bile duct and in the common hepatic duct. - Multiple stones were visualized endosonographically in the gallbladder. - No malignant-appearing lymph nodes were visualized in the celiac region (level 20), peripancreatic region and porta hepatis region.  FINAL MICROSCOPIC DIAGNOSIS:   A. STOMACH, BIOPSY:  - Gastric antral and oxyntic mucosa with no specific histopathologic  changes  - Helicobacter pylori-like organisms are not identified  on routine HE  stain     MRCP 03/07/23 IMPRESSION: 1. Unchanged appearance of the pancreas. Severe, diffuse atrophy of the pancreas with dilatation of the pancreatic duct measuring up to 1.1 cm along its length. Multiple cystic lesions are again seen, largest in the pancreatic uncinate again measuring 2.3 x 1.6 cm. No solid component or suspicious contrast enhancement. Findings remain concerning for IPMN with main duct involvement. 2. Hepatomegaly and hepatic steatosis. 3. Cholelithiasis.   MRCP 06/27/23: IMPRESSION: 1. Unchanged appearance of the pancreas, diffusely atrophic with dilatation of the pancreatic duct along its length, again measuring up to 1.1 cm in caliber. Multiple unchanged cystic lesions throughout the pancreas, largest again in the pancreatic uncinate measuring up to 2.3 x 1.5 cm. No solid component or suspicious contrast enhancement. Findings are again concerning for multiple IPMNs with main duct involvement. 2. Hepatomegaly and hepatic steatosis. 3. Cholelithiasis.    Past Medical History:  Diagnosis Date   Allergic rhinitis    Anxiety    Bladder stones    CKD (chronic kidney disease), stage III (HCC)    Common iliac aneurysm (HCC) right side   followed by vascular-- dr Charlotte Cookey (note in epic)   Environmental and seasonal allergies    External carotid artery stenosis     per last duplex 05-18-2011  right moderate stenosis and left moderate to severe   GERD (gastroesophageal reflux disease)    1950's and 60's   Hematuria    History of adenomatous polyp of colon    tubular adenoma's   History of bladder stone    History of DVT in adulthood    08/ 2009 right lower extremity;  2013  left lower extremity  (10-11-2018 per pt no dvt since 2013)   History of kidney stones    History of pulmonary embolism    bilatera lung  08/ 2009  (10-11-2018  per pt no pe since 2009)   History of wheezing    allergies--- prn inhaler   Hypertension    Hypogonadism  male    Internal carotid artery stenosis, bilateral     per last duplex 05-18-2011 in epic,   bilateral 40-59% stenosis  (10-11-2018  per pt denies stroke/tia S&S)   IPMN (intraductal papillary mucinous neoplasm)    Lower urinary tract symptoms (LUTS)    Mixed hyperlipidemia    Nephrolithiasis    Testosterone  deficiency    Type 2 diabetes mellitus (HCC)    Vitamin D  deficiency    Wears glasses      Past Surgical History:  Procedure Laterality Date   BIOPSY  12/14/2022   Procedure: BIOPSY;  Surgeon: Normie Becton., MD;  Location: [facility];  Service: Gastroenterology;;   COLONOSCOPY  last one 10-25-2017   CYSTOSCOPY WITH LITHOLAPAXY N/A 07/18/2016   Procedure: CYSTOSCOPY WITH LITHOLAPAXY;  Surgeon: Mark Ottelin, MD;  Location: [facility];  Service: Urology;  Laterality: N/A;   CYSTOSCOPY WITH LITHOLAPAXY N/A 10/15/2018   Procedure: CYSTOSCOPY WITH LITHOLAPAXY;  Surgeon: Ottelin, Mark, MD;  Location: [facility];  Service: Urology;  Laterality: N/A;   ESOPHAGOGASTRODUODENOSCOPY (EGD) WITH PROPOFOL  N/A 12/14/2022   Procedure: ESOPHAGOGASTRODUODENOSCOPY (EGD) WITH PROPOFOL ;  Surgeon: Brice Campi Albino Alu., MD;  Location: [facility];  Service: Gastroenterology;  Laterality: N/A;   IVC FILTER INSERTION  11/21/2007   IVC FILTER REMOVAL  01/31/2008   ORCHIOPEXY Bilateral 1950's   undescended testis's   RIGHT CHEEK/MOUTH BIOPSY  09/16/2008  (office)   squamous hyperplasia w/ hyperkeratosis   RIGHT DEEP CERVICAL NODE BIOPSY  01/03/2001   right posterior neck (per path report-- lymphoid hyperplasia w/ florid follicular hyperplasia, no malignancy(   TONSILLECTOMY  age 51   UPPER ESOPHAGEAL ENDOSCOPIC ULTRASOUND (EUS) N/A 12/14/2022   Procedure: UPPER ESOPHAGEAL ENDOSCOPIC ULTRASOUND (EUS);  Surgeon: Normie Becton., MD;  Location: [facility];  Service: Gastroenterology;  Laterality: N/A;   Family History  Problem Relation Age of Onset    Alzheimer's disease Mother    Diabetes Mother    Prostate cancer Father    Breast cancer Sister    Colon cancer Neg Hx    Rectal cancer Neg Hx    Stomach cancer Neg Hx    Esophageal cancer Neg Hx    Colon polyps Neg Hx    Social History   Tobacco Use   Smoking status: Former    Current packs/day: 0.00    Average packs/day: 1 pack/day for 20.0 years (20.0 ttl pk-yrs)    Types: Cigarettes, Cigars    Start date: 04/16/1971    Quit date: 04/16/1991    Years since quitting: 32.3   Smokeless tobacco: Never  Vaping Use   Vaping status: Never Used  Substance Use Topics   Alcohol use: Yes    Comment: ocassional   1 drink every 6 weeks   Drug use: Never   Current Outpatient Medications  Medication Sig Dispense Refill   aspirin  81 MG tablet Take 81 mg by mouth daily.     busPIRone  (BUSPAR ) 5 MG tablet Take     1/2 to 1 tablet     3 x /day      for Anxiety 30 tablet 0   Cholecalciferol (VITAMIN D3) 5000 units CAPS Take 2 capsules (10,000 Units total) by mouth daily. (Patient taking differently: Take 2 capsules by mouth daily.)     famotidine  (PEPCID ) 40 MG tablet Take 40 mg by mouth as needed.     fenofibrate  (TRICOR ) 145 MG tablet TAKE 1 TABLET DAILY FOR TRIGLYCERIDES (BLOOD FATS) 90 tablet 3   finasteride  (PROSCAR ) 5 MG tablet Take  1 tablet  Daily  for Prostate 90 tablet 3   fluticasone  (FLONASE ) 50 MCG/ACT nasal spray SPRAY 2 SPRAYS INTO EACH NOSTRIL EVERY DAY (Patient taking differently: as needed for allergies.) 48 mL 0   glipiZIDE  (GLUCOTROL ) 10 MG tablet TAKE 1 TABLET BY MOUTH 3 TIMES A DAY WITH MEALS FOR DIABETES 270 tablet 1   losartan  (COZAAR ) 25 MG tablet TAKE 1 TABLET DAILY FOR BLOOD PRESSURE GOAL <140/80 AND KIDNEY PROTECTION. 90 tablet 3   metFORMIN  (GLUCOPHAGE -XR) 500 MG 24 hr tablet TAKE 2 TABLETS TWICE A DAY WITH MEALS FOR DIABETES (Patient taking differently: TAKE 1 TABLETS TWICE A DAY WITH MEALS FOR DIABETES) 360 tablet 3   Multiple Vitamins-Minerals (MEGA MULTIVITAMIN  FOR MEN) TABS Take 1 tablet by mouth daily.     ONETOUCH ULTRA test strip TEST BLOOD SUGAR ONCE DAILY FOR DIABETES 100 strip 5   rosuvastatin  (CRESTOR ) 5 MG tablet TAKE 1 TABLET BY MOUTH EVERY DAY FOR CHOLESTEROL 90 tablet  2   Semaglutide , 2 MG/DOSE, (OZEMPIC , 2 MG/DOSE,) 8 MG/3ML SOPN Inject  2 mg  into skin  weekly  for Diabetes (Dx:  e11.29) 9 mL 3   tamsulosin  (FLOMAX ) 0.4 MG CAPS capsule TAKE 1 CAPSULE AT BEDTIME FOR PROSTATE 90 capsule 3   Chromium-Cinnamon (CINNAMON PLUS CHROMIUM) [phone] MCG-MG CAPS Take 2 capsules by mouth daily. (Patient not taking: Reported on 07/28/2023)     methylcellulose (CITRUCEL) oral powder Take 1 packet by mouth daily. (Patient not taking: Reported on 07/28/2023)     No current facility-administered medications for this visit.   Allergies  Allergen Reactions   Penicillins Itching   Doxycycline     unknown   Niacin And Related Other (See Comments)    flushing   Sulfa Antibiotics Itching     Review of Systems: All systems reviewed and negative except where noted in HPI.   Lab Results  Component Value Date   NA 141 02/13/2023   CL 109 02/13/2023   K 4.5 02/13/2023   CO2 23 02/13/2023   BUN 33 (H) 02/13/2023   CREATININE 1.55 (H) 02/13/2023   EGFR 44 (L) 02/13/2023   CALCIUM  10.0 02/13/2023   ALBUMIN 3.7 11/09/2022   GLUCOSE 191 (H) 02/13/2023    Lab Results  Component Value Date   WBC 7.0 02/13/2023   HGB 13.1 (L) 02/13/2023   HCT 39.4 02/13/2023   MCV 93.6 02/13/2023   PLT 149 02/13/2023    Lab Results  Component Value Date   ALT 11 02/13/2023   AST 11 02/13/2023   ALKPHOS 45 11/09/2022   BILITOT 0.6 02/13/2023     Physical Exam: BP 118/82   Pulse 80   Ht 5\' 10"  (1.778 m)   Wt 200 lb 6 oz (90.9 kg)   SpO2 98%   BMI 28.75 kg/m  Constitutional: Pleasant,well-developed, male in no acute distress. Neurological: Alert and oriented to person place and time. Psychiatric: Normal mood and affect. Behavior is  normal.   ASSESSMENT: 83 y.o. male here for assessment of the following  1. IPMN (intraductal papillary mucinous neoplasm)   2. History of colon polyps    Patient with changes of IPMN over time on imaging, surveyed with the EUS and on MRCP most recently with Dr. Leighton Punches.  They have discussed surgical options with him regarding this finding.  He understands what this is, risks of malignancy, and risks of surgery should he wish to pursue that option.  He has done quite a bit of reading about this and understands what all this entails.  He is at peace with his decision of surveillance and hopefully this does not progress with time.  He does have occasional abdominal pains and altered bowel habits.  Discussed if he wanted check pancreatic fecal elastase or consider empiric course of pancreatic enzyme supplementation.  Symptoms are pretty mild and not too bothersome, he wishes to hold off for now.  We had a discussion if he wanted to pursue further surveillance colonoscopy at his age.  He is really not interested in pursuing any further surveillance colonoscopy, he does not have any high risk polyps on his last exam.  He understands his options and declines further surveillance  PLAN: - MRCP every 6 months per Dr. Leighton Punches - he declines surgical option - consideration for fecal elastase given some of his bowel symptoms, he declines but will contact me if he reconsiders or develops steattorrhea - no further surveillance colonoscopy, he declines - follow up in one  year if not sooner with issues  Christi Coward, MD [facility]

## 2023-07-28 NOTE — Patient Instructions (Signed)
 Please follow up in 1 year.  Thank you for entrusting me with your care and for choosing [facility], Dr. Alvester Johnson    If your blood pressure at your visit was 140/90 or greater, please contact your primary care physician to follow up on this. ______________________________________________________  If you are age 83 or older, your body mass index should be between 23-30. Your Body mass index is 28.75 kg/m. If this is out of the aforementioned range listed, please consider follow up with your Primary Care Provider.  If you are age 35 or younger, your body mass index should be between 19-25. Your Body mass index is 28.75 kg/m. If this is out of the aformentioned range listed, please consider follow up with your Primary Care Provider.  ________________________________________________________  The [facility] providers would like to encourage you to use MYCHART to communicate with providers for non-urgent requests or questions.  Due to long hold times on the telephone, sending your provider a message by [facility] may be a faster and more efficient way to get a response.  Please allow 48 business hours for a response.  Please remember that this is for non-urgent requests.  _______________________________________________________  Due to recent changes in healthcare laws, you may see the results of your imaging and laboratory studies on MyChart before your provider has had a chance to review them.  We understand that in some cases there may be results that are confusing or concerning to you. Not all laboratory results come back in the same time frame and the provider may be waiting for multiple results in order to interpret others.  Please give us  48 hours in order for your provider to thoroughly review all the results before contacting the office for clarification of your results.

## 2023-08-10 ENCOUNTER — Ambulatory Visit: Payer: Medicare Other | Admitting: Internal Medicine

## 2023-11-14 ENCOUNTER — Ambulatory Visit: Payer: Medicare Other | Admitting: Nurse Practitioner

## 2023-12-11 ENCOUNTER — Other Ambulatory Visit: Payer: Self-pay | Admitting: Surgery

## 2023-12-11 DIAGNOSIS — D490 Neoplasm of unspecified behavior of digestive system: Secondary | ICD-10-CM

## 2023-12-27 ENCOUNTER — Other Ambulatory Visit

## 2024-01-02 ENCOUNTER — Ambulatory Visit
Admission: RE | Admit: 2024-01-02 | Discharge: 2024-01-02 | Disposition: A | Discharge status: Home / Self Care | Source: Ambulatory Visit | Attending: Surgery | Admitting: Surgery

## 2024-01-02 DIAGNOSIS — D490 Neoplasm of unspecified behavior of digestive system: Secondary | ICD-10-CM

## 2024-01-02 MED ORDER — GADOPICLENOL 0.5 MMOL/ML IV SOLN
9.0000 mL | Freq: Once | INTRAVENOUS | Status: AC | PRN
  Administered 2024-01-02: 9 mL via INTRAVENOUS

## 2024-03-05 ENCOUNTER — Encounter: Payer: Medicare Other | Admitting: Internal Medicine

## 2024-03-06 ENCOUNTER — Telehealth: Payer: Self-pay

## 2024-03-06 ENCOUNTER — Other Ambulatory Visit: Payer: Self-pay | Admitting: *Deleted

## 2024-03-06 NOTE — Telephone Encounter (Signed)
 GI Tumor Boards  Joshua Peterson was presented on the GI tumor board today. he is a candidate for genetic testing.  Joshua Peterson meets National Comprehensive Cancer Network (NCCN) criteria for genetic testing for polyposis based on his personal history of greater than 10 lifetime tubular adenomas.  Warren Ahle, MS, [facility] Cancer Genetic Counselor [email or web address] [phone]

## 2024-03-06 NOTE — Progress Notes (Signed)
 The proposed treatment discussed in conference is for discussion purpose only and is not a binding recommendation.  The patients have not been physically examined, or presented with their treatment options.  Therefore, final treatment plans cannot be decided.

## 2024-03-12 ENCOUNTER — Other Ambulatory Visit: Payer: Self-pay | Admitting: Surgery

## 2024-03-12 DIAGNOSIS — Z860100 Personal history of colon polyps, unspecified: Secondary | ICD-10-CM
